# Patient Record
Sex: Female | Born: 1935 | Race: White | Hispanic: No | Smoking: Never smoker
Health system: Southern US, Community
[De-identification: ages and names within clinical notes are randomized; demographics above are authoritative.]

## PROBLEM LIST (undated history)

## (undated) DIAGNOSIS — E7800 Pure hypercholesterolemia, unspecified: Secondary | ICD-10-CM

## (undated) DIAGNOSIS — G629 Polyneuropathy, unspecified: Secondary | ICD-10-CM

## (undated) DIAGNOSIS — I4891 Unspecified atrial fibrillation: Secondary | ICD-10-CM

## (undated) DIAGNOSIS — I429 Cardiomyopathy, unspecified: Secondary | ICD-10-CM

## (undated) DIAGNOSIS — M1990 Unspecified osteoarthritis, unspecified site: Secondary | ICD-10-CM

## (undated) DIAGNOSIS — I10 Essential (primary) hypertension: Secondary | ICD-10-CM

## (undated) DIAGNOSIS — C4491 Basal cell carcinoma of skin, unspecified: Secondary | ICD-10-CM

## (undated) DIAGNOSIS — D472 Monoclonal gammopathy: Secondary | ICD-10-CM

## (undated) DIAGNOSIS — I071 Rheumatic tricuspid insufficiency: Secondary | ICD-10-CM

## (undated) DIAGNOSIS — R102 Pelvic and perineal pain: Secondary | ICD-10-CM

## (undated) DIAGNOSIS — R7881 Bacteremia: Secondary | ICD-10-CM

## (undated) DIAGNOSIS — R600 Localized edema: Secondary | ICD-10-CM

## (undated) DIAGNOSIS — K279 Peptic ulcer, site unspecified, unspecified as acute or chronic, without hemorrhage or perforation: Secondary | ICD-10-CM

## (undated) DIAGNOSIS — I872 Venous insufficiency (chronic) (peripheral): Secondary | ICD-10-CM

## (undated) DIAGNOSIS — J40 Bronchitis, not specified as acute or chronic: Secondary | ICD-10-CM

## (undated) DIAGNOSIS — R0609 Other forms of dyspnea: Secondary | ICD-10-CM

## (undated) DIAGNOSIS — B019 Varicella without complication: Secondary | ICD-10-CM

## (undated) DIAGNOSIS — I509 Heart failure, unspecified: Secondary | ICD-10-CM

## (undated) DIAGNOSIS — D126 Benign neoplasm of colon, unspecified: Secondary | ICD-10-CM

## (undated) DIAGNOSIS — D649 Anemia, unspecified: Secondary | ICD-10-CM

## (undated) DIAGNOSIS — I272 Pulmonary hypertension, unspecified: Secondary | ICD-10-CM

## (undated) DIAGNOSIS — M810 Age-related osteoporosis without current pathological fracture: Secondary | ICD-10-CM

## (undated) DIAGNOSIS — G4730 Sleep apnea, unspecified: Secondary | ICD-10-CM

## (undated) DIAGNOSIS — I482 Chronic atrial fibrillation, unspecified: Secondary | ICD-10-CM

## (undated) DIAGNOSIS — R0600 Dyspnea, unspecified: Secondary | ICD-10-CM

## (undated) DIAGNOSIS — N6019 Diffuse cystic mastopathy of unspecified breast: Secondary | ICD-10-CM

## (undated) DIAGNOSIS — I495 Sick sinus syndrome: Secondary | ICD-10-CM

## (undated) DIAGNOSIS — J45909 Unspecified asthma, uncomplicated: Secondary | ICD-10-CM

## (undated) DIAGNOSIS — E785 Hyperlipidemia, unspecified: Secondary | ICD-10-CM

## (undated) DIAGNOSIS — D509 Iron deficiency anemia, unspecified: Secondary | ICD-10-CM

## (undated) DIAGNOSIS — J189 Pneumonia, unspecified organism: Secondary | ICD-10-CM

## (undated) DIAGNOSIS — I2510 Atherosclerotic heart disease of native coronary artery without angina pectoris: Secondary | ICD-10-CM

## (undated) DIAGNOSIS — K219 Gastro-esophageal reflux disease without esophagitis: Secondary | ICD-10-CM

## (undated) HISTORY — PX: INSERT / REPLACE / REMOVE PACEMAKER: SUR710

## (undated) HISTORY — PX: TUBAL LIGATION: SHX77

## (undated) HISTORY — PX: ABDOMINAL HYSTERECTOMY: SHX81

## (undated) HISTORY — DX: Unspecified asthma, uncomplicated: J45.909

## (undated) HISTORY — PX: JOINT REPLACEMENT: SHX530

## (undated) HISTORY — DX: Hyperlipidemia, unspecified: E78.5

## (undated) HISTORY — PX: CARDIAC CATHETERIZATION: SHX172

## (undated) HISTORY — PX: BILATERAL KNEE ARTHROSCOPY: SUR91

## (undated) HISTORY — PX: PACEMAKER INSERTION: SHX728

## (undated) HISTORY — DX: Essential (primary) hypertension: I10

## (undated) HISTORY — PX: BREAST SURGERY: SHX581

## (undated) HISTORY — PX: CARDIAC PACEMAKER PLACEMENT: SHX583

## (undated) HISTORY — PX: REPLACEMENT TOTAL KNEE: SUR1224

## (undated) HISTORY — PX: HYSTERECTOMY: SHX81

---

## 1988-12-08 HISTORY — PX: BREAST EXCISIONAL BIOPSY: SUR124

## 2006-12-21 ENCOUNTER — Ambulatory Visit: Payer: Self-pay | Admitting: Family Medicine

## 2007-03-08 ENCOUNTER — Ambulatory Visit: Payer: Self-pay | Admitting: Family Medicine

## 2007-08-16 ENCOUNTER — Ambulatory Visit: Payer: Self-pay | Admitting: Unknown Physician Specialty

## 2007-09-08 ENCOUNTER — Ambulatory Visit: Payer: Self-pay | Admitting: Oncology

## 2007-09-15 ENCOUNTER — Ambulatory Visit: Payer: Self-pay | Admitting: Oncology

## 2007-10-09 ENCOUNTER — Ambulatory Visit: Payer: Self-pay | Admitting: Oncology

## 2007-12-09 ENCOUNTER — Ambulatory Visit: Payer: Self-pay | Admitting: Oncology

## 2008-01-03 ENCOUNTER — Ambulatory Visit: Payer: Self-pay | Admitting: Oncology

## 2008-01-09 ENCOUNTER — Ambulatory Visit: Payer: Self-pay | Admitting: Oncology

## 2008-05-01 ENCOUNTER — Ambulatory Visit: Payer: Self-pay | Admitting: Oncology

## 2008-05-08 ENCOUNTER — Ambulatory Visit: Payer: Self-pay | Admitting: Oncology

## 2008-08-15 ENCOUNTER — Ambulatory Visit: Payer: Self-pay | Admitting: Internal Medicine

## 2008-12-26 ENCOUNTER — Ambulatory Visit: Payer: Self-pay | Admitting: Unknown Physician Specialty

## 2008-12-28 ENCOUNTER — Inpatient Hospital Stay: Payer: Self-pay | Admitting: Unknown Physician Specialty

## 2009-08-13 ENCOUNTER — Ambulatory Visit: Payer: Self-pay | Admitting: Internal Medicine

## 2009-08-27 ENCOUNTER — Ambulatory Visit: Payer: Self-pay | Admitting: Internal Medicine

## 2010-06-04 ENCOUNTER — Ambulatory Visit: Payer: Self-pay | Admitting: Obstetrics and Gynecology

## 2010-06-06 ENCOUNTER — Ambulatory Visit: Payer: Self-pay | Admitting: Obstetrics and Gynecology

## 2010-06-14 ENCOUNTER — Ambulatory Visit: Payer: Self-pay | Admitting: Obstetrics and Gynecology

## 2010-06-17 ENCOUNTER — Ambulatory Visit: Payer: Self-pay | Admitting: Obstetrics and Gynecology

## 2010-06-17 LAB — PATHOLOGY REPORT

## 2010-06-20 LAB — PATHOLOGY REPORT

## 2010-09-02 ENCOUNTER — Ambulatory Visit: Payer: Self-pay | Admitting: Internal Medicine

## 2010-09-09 ENCOUNTER — Ambulatory Visit: Payer: Self-pay | Admitting: Internal Medicine

## 2011-09-04 ENCOUNTER — Ambulatory Visit: Payer: Self-pay | Admitting: Internal Medicine

## 2012-05-03 ENCOUNTER — Ambulatory Visit: Payer: Self-pay | Admitting: Unknown Physician Specialty

## 2012-10-01 ENCOUNTER — Ambulatory Visit: Payer: Self-pay | Admitting: Internal Medicine

## 2013-10-03 ENCOUNTER — Ambulatory Visit: Payer: Self-pay | Admitting: Internal Medicine

## 2013-10-06 ENCOUNTER — Ambulatory Visit: Payer: Self-pay | Admitting: Internal Medicine

## 2014-09-13 ENCOUNTER — Ambulatory Visit: Payer: Self-pay | Admitting: Internal Medicine

## 2014-09-13 DIAGNOSIS — G4733 Obstructive sleep apnea (adult) (pediatric): Secondary | ICD-10-CM | POA: Insufficient documentation

## 2014-10-05 ENCOUNTER — Ambulatory Visit: Payer: Self-pay | Admitting: Internal Medicine

## 2015-02-26 DIAGNOSIS — I482 Chronic atrial fibrillation, unspecified: Secondary | ICD-10-CM | POA: Insufficient documentation

## 2015-02-27 ENCOUNTER — Ambulatory Visit: Payer: Self-pay | Admitting: Internal Medicine

## 2015-03-05 ENCOUNTER — Ambulatory Visit: Payer: Self-pay | Admitting: Cardiology

## 2015-03-05 LAB — BASIC METABOLIC PANEL
Anion Gap: 5 — ABNORMAL LOW (ref 7–16)
BUN: 23 mg/dL — ABNORMAL HIGH
Calcium, Total: 8.9 mg/dL
Chloride: 106 mmol/L
Co2: 28 mmol/L
Creatinine: 0.75 mg/dL
EGFR (African American): >60
EGFR (Non-African Amer.): >60
Glucose: 92 mg/dL
Potassium: 4.1 mmol/L
Sodium: 139 mmol/L

## 2015-03-05 LAB — CBC WITH DIFFERENTIAL/PLATELET
Basophil #: 0 10*3/uL (ref 0.0–0.1)
Basophil %: 0.6 %
Eosinophil #: 0.1 10*3/uL (ref 0.0–0.7)
Eosinophil %: 1 %
HCT: 36.6 % (ref 35.0–47.0)
HGB: 11.9 g/dL — ABNORMAL LOW (ref 12.0–16.0)
Lymphocyte #: 1.6 10*3/uL (ref 1.0–3.6)
Lymphocyte %: 23.1 %
MCH: 31.1 pg (ref 26.0–34.0)
MCHC: 32.5 g/dL (ref 32.0–36.0)
MCV: 96 fL (ref 80–100)
Monocyte #: 0.6 x10 3/mm (ref 0.2–0.9)
Monocyte %: 9.6 %
Neutrophil #: 4.5 10*3/uL (ref 1.4–6.5)
Neutrophil %: 65.7 %
Platelet: 154 10*3/uL (ref 150–440)
RBC: 3.82 10*6/uL (ref 3.80–5.20)
RDW: 15 % — ABNORMAL HIGH (ref 11.5–14.5)
WBC: 6.8 10*3/uL (ref 3.6–11.0)

## 2015-03-05 LAB — PROTIME-INR
INR: 1.2
Prothrombin Time: 15.1 secs — ABNORMAL HIGH

## 2015-03-05 LAB — APTT: Activated PTT: 30.3 secs (ref 23.6–35.9)

## 2015-03-14 ENCOUNTER — Ambulatory Visit
Admit: 2015-03-14 | Disposition: A | Discharge status: Home / Self Care | Payer: Self-pay | Attending: Cardiology | Admitting: Cardiology

## 2015-03-21 DIAGNOSIS — R600 Localized edema: Secondary | ICD-10-CM | POA: Insufficient documentation

## 2015-03-27 ENCOUNTER — Emergency Department: Payer: Self-pay

## 2015-03-27 ENCOUNTER — Emergency Department: Payer: Medicare (Managed Care)

## 2015-03-27 ENCOUNTER — Emergency Department
Admission: EM | Admit: 2015-03-27 | Discharge: 2015-03-27 | Disposition: A | Discharge status: Home / Self Care | Payer: Medicare (Managed Care) | Attending: Emergency Medicine | Admitting: Emergency Medicine

## 2015-03-27 DIAGNOSIS — S39011A Strain of muscle, fascia and tendon of abdomen, initial encounter: Secondary | ICD-10-CM | POA: Insufficient documentation

## 2015-03-27 DIAGNOSIS — R1031 Right lower quadrant pain: Secondary | ICD-10-CM | POA: Insufficient documentation

## 2015-03-27 DIAGNOSIS — N898 Other specified noninflammatory disorders of vagina: Secondary | ICD-10-CM | POA: Insufficient documentation

## 2015-03-27 DIAGNOSIS — S76219A Strain of adductor muscle, fascia and tendon of unspecified thigh, initial encounter: Secondary | ICD-10-CM

## 2015-03-27 HISTORY — DX: Unspecified osteoarthritis, unspecified site: M19.90

## 2015-03-27 HISTORY — DX: Pure hypercholesterolemia, unspecified: E78.00

## 2015-03-27 HISTORY — DX: Essential (primary) hypertension: I10

## 2015-03-27 HISTORY — DX: Unspecified atrial fibrillation: I48.91

## 2015-03-27 LAB — COMPREHENSIVE METABOLIC PANEL
ALT: 16 U/L (ref 0–55)
AST (SGOT): 21 U/L (ref 5–34)
Albumin/Globulin Ratio: 1.1 (ref 0.9–2.2)
Albumin: 3.4 g/dL — ABNORMAL LOW (ref 3.5–5.0)
Alkaline Phosphatase: 89 U/L (ref 37–106)
Anion Gap: 8 (ref 5.0–15.0)
BUN: 20 mg/dL — ABNORMAL HIGH (ref 7.0–19.0)
Bilirubin, Total: 1.9 mg/dL — ABNORMAL HIGH (ref 0.2–1.2)
CO2: 25 mEq/L (ref 22–29)
Calcium: 8.8 mg/dL (ref 7.9–10.2)
Chloride: 105 mEq/L (ref 100–111)
Creatinine: 0.8 mg/dL (ref 0.6–1.0)
Globulin: 3.2 g/dL (ref 2.0–3.6)
Glucose: 99 mg/dL (ref 70–100)
Potassium: 4 mEq/L (ref 3.5–5.1)
Protein, Total: 6.6 g/dL (ref 6.0–8.3)
Sodium: 138 mEq/L (ref 136–145)

## 2015-03-27 LAB — URINALYSIS
Bilirubin, UA: NEGATIVE
Glucose, UA: NEGATIVE
Leukocyte Esterase, UA: NEGATIVE
Nitrite, UA: NEGATIVE
Protein, UR: 30 — AB
Specific Gravity UA: 1.024 (ref 1.001–1.035)
Urine pH: 6 (ref 5.0–8.0)
Urobilinogen, UA: 2 mg/dL (ref 0.2–2.0)

## 2015-03-27 LAB — CBC AND DIFFERENTIAL
Hematocrit: 35.7 % — ABNORMAL LOW (ref 37.0–47.0)
Hgb: 11 g/dL — ABNORMAL LOW (ref 12.0–16.0)
MCH: 30.8 pg (ref 28.0–32.0)
MCHC: 30.8 g/dL — ABNORMAL LOW (ref 32.0–36.0)
MCV: 100 fL (ref 80.0–100.0)
MPV: 10.3 fL (ref 9.4–12.3)
Platelets: 140 10*3/uL (ref 140–400)
RBC: 3.57 10*6/uL — ABNORMAL LOW (ref 4.20–5.40)
RDW: 15 % (ref 12–15)
WBC: 6.75 10*3/uL (ref 3.50–10.80)

## 2015-03-27 LAB — URINE MICROSCOPIC

## 2015-03-27 LAB — MAN DIFF ONLY
Atypical Lymphocytes %: 1 %
Atypical Lymphocytes Absolute: 0.07 10*3/uL — ABNORMAL HIGH
Band Neutrophils Absolute: 0.07 10*3/uL (ref 0.00–1.00)
Band Neutrophils: 1 %
Basophils Absolute Manual: 0 10*3/uL (ref 0.00–0.20)
Basophils Manual: 0 %
Eosinophils Absolute Manual: 0 10*3/uL (ref 0.00–0.70)
Eosinophils Manual: 0 %
Lymphocytes Absolute Manual: 0.41 10*3/uL — ABNORMAL LOW (ref 0.50–4.40)
Lymphocytes Manual: 6 %
Monocytes Absolute: 0.74 10*3/uL (ref 0.00–1.20)
Monocytes Manual: 11 %
Neutrophils Absolute Manual: 5.47 10*3/uL (ref 1.80–8.10)
Nucleated RBC: 0 /100 WBC (ref 0–1)
Segmented Neutrophils: 81 %

## 2015-03-27 LAB — LIPASE: Lipase: 28 U/L (ref 8–78)

## 2015-03-27 LAB — CELL MORPHOLOGY
Cell Morphology: NORMAL
Platelet Estimate: NORMAL

## 2015-03-27 LAB — GFR: EGFR: >60

## 2015-03-27 MED ORDER — IOHEXOL 240 MG/ML IJ SOLN
50.0000 mL | Freq: Once | INTRAMUSCULAR | Status: AC
Start: 2015-03-27 — End: 2015-03-27
  Administered 2015-03-27: 50 mL via ORAL

## 2015-03-27 MED ORDER — SODIUM CHLORIDE 0.9 % IV BOLUS
1000.0000 mL | Freq: Once | INTRAVENOUS | Status: AC
Start: 2015-03-27 — End: 2015-03-27
  Administered 2015-03-27: 1000 mL via INTRAVENOUS

## 2015-03-27 MED ORDER — ONDANSETRON HCL 4 MG/2ML IJ SOLN
4.0000 mg | Freq: Once | INTRAMUSCULAR | Status: AC
Start: 2015-03-27 — End: 2015-03-27
  Administered 2015-03-27: 4 mg via INTRAVENOUS
  Filled 2015-03-27: qty 2

## 2015-03-27 MED ORDER — IODIXANOL 320 MG/ML IV SOLN
100.0000 mL | Freq: Once | INTRAVENOUS | Status: AC | PRN
Start: 2015-03-27 — End: 2015-03-27
  Administered 2015-03-27: 100 mL via INTRAVENOUS

## 2015-03-27 MED ORDER — HYDROCODONE-ACETAMINOPHEN 5-325 MG PO TABS
0.5000 | ORAL_TABLET | Freq: Four times a day (QID) | ORAL | Status: DC | PRN
Start: 2015-03-27 — End: 2015-04-07

## 2015-03-27 MED ORDER — MORPHINE SULFATE 2 MG/ML IJ/IV SOLN (WRAP)
2.0000 mg | Freq: Once | Status: AC
Start: 2015-03-27 — End: 2015-03-27
  Administered 2015-03-27: 2 mg via INTRAVENOUS
  Filled 2015-03-27: qty 1

## 2015-03-27 NOTE — Discharge Instructions (Signed)
p  Abdominal Pain    You have been diagnosed with abdominal (belly) pain. The cause of your pain is not yet known.    Many things can cause abdominal pain. Examples include viral infections and bowel (intestine) spasms. You might need another examination or more tests to find out why you have pain.    At this time, your pain does not seem to be caused by anything dangerous. You do not need surgery. You do not need to stay in the hospital.     Though we don't believe your condition is dangerous right now, it is important to be careful. Sometimes a problem that seems mild can become serious later. This is why it is very important that you return here or go to the nearest Emergency Department unless you are 100% improved.    YOU SHOULD SEEK MEDICAL ATTENTION IMMEDIATELY, EITHER HERE OR AT THE NEAREST EMERGENCY DEPARTMENT, IF ANY OF THE FOLLOWING OCCURS:   Your pain does not go away or gets worse.   You cannot keep fluids down or your vomit is dark green.    You vomit blood or see blood in your stool. Blood might be bright red or dark red. It can also be black and look like tar.   You have a fever (temperature higher than 100.40F / 38C) or shaking chills.   Your skin or eyes look yellow or your urine looks brown.   You have severe diarrhea.              Muscle Strain, General    You have been diagnosed with a muscle strain.    Any muscle in the body can be strained. A strain is an injury to muscles where some muscle fibers are injured by being stretched or partly torn. This usually happens from using the muscle too much or from doing an activity the muscle is not used to.    Some strain symptoms are pain, muscle cramping and soreness to the touch.    Often, muscle pain and stiffness are worse the next day. This is much like what happens when someone starts exercising for the first time. After exercising, the person may feel pretty good. However, the next day all the exercised muscles are stiff and  sore.    General strain treatment includes:   Resting the affected part.   Pain medicine.   Muscle relaxant medicines.   Warm compresses (such as a warm, moist towel).   Gently stretching the injured muscle.   When tolerated, gently massaging the injured area.    This injury is self-limited (it gets better on its own). It rarely needs specific treatment.    YOU SHOULD SEEK MEDICAL ATTENTION IMMEDIATELY, EITHER HERE OR AT THE NEAREST EMERGENCY DEPARTMENT, IF ANY OF THE FOLLOWING OCCURS:   Major increase in swelling of the affected area.   Pain gets worse instead of gradually improving.   Skin gets red over the affected area.   Unable to use the affected limb. Limb weakness or numbness.            Vaginal Irritation    You have been seen for irritation of your vagina.    There are several reasons why this area can become irritated. They are:   Trauma (injury or shock) while having sex.   Infections like ones from yeast or a sexually transmitted disease (STD).   Use of tampons or other foreign bodies in the vagina.   Foreign bodies in the vagina  for an extended period of time. This could be a tampon, piece of toilet paper, a birth control device, or a "toy" used during sex.   Contact with detergents or other irritants on underwear and other clothing.    You should not have sex until your irritation is completely gone.    Do not use tampons or other foreign bodies until the irritation is completely gone.    Follow up with your family doctor about your vaginal irritation.    Follow up with your Ob/Gyn about your vaginal irritation.    YOU SHOULD SEEK MEDICAL ATTENTION IMMEDIATELY, EITHER HERE OR AT THE NEAREST EMERGENCY DEPARTMENT, IF ANY OF THE FOLLOWING OCCUR:   Your symptoms get worse.   You have any other problems or concerns.   You have worse vaginal discharge or bleeding.   You develop fever (temperature higher than 100.64F / 38C).

## 2015-03-27 NOTE — ED Notes (Signed)
Pt has had burning pain in groin, labia and radiates to legs since Sunday

## 2015-03-27 NOTE — ED Provider Notes (Signed)
Physician/Midlevel provider first contact with patient: 03/27/15 1720         History     Chief Complaint   Patient presents with   . Groin Pain   . Leg Pain     HPI Comments: Pt presents c/o bilateral groin pain, vaginal irritation, and RLQ abdominal pain.  Pt states she did a lot of walking 3 days ago which she is not used to and she started having pain the next day.  Pt also states she has had vaginal burning and worried she has a yeast infection.  No vaginal itching.  RLQ abdominal pain for the past 3 day.  No N/V/D.      Patient is a 79 y.o. female presenting with groin pain and abdominal pain. The history is provided by the patient. No language interpreter was used.   Groin Pain  This is a new problem. The current episode started in the past 7 days. The problem occurs constantly. The problem has been unchanged. Associated symptoms include abdominal pain. Pertinent negatives include no anorexia, chest pain, chills, congestion, coughing, diaphoresis, fever, headaches, nausea, neck pain, numbness, rash, sore throat, visual change, vomiting or weakness. The symptoms are aggravated by twisting and walking. She has tried nothing for the symptoms.   Abdominal Pain  Pain location:  RLQ  Pain quality: aching    Pain radiates to:  Does not radiate  Pain severity:  Moderate  Onset quality:  Gradual  Duration:  3 days  Timing:  Constant  Progression:  Unchanged  Chronicity:  New  Relieved by:  Nothing  Worsened by:  Palpation  Ineffective treatments:  None tried  Associated symptoms: no anorexia, no chest pain, no chills, no cough, no dysuria, no fever, no nausea, no shortness of breath, no sore throat, no vaginal bleeding, no vaginal discharge and no vomiting             Past Medical History   Diagnosis Date   . Hypertension    . Congestive heart failure    . Atrial fibrillation    . Arthritis    . Hypercholesterolemia        Past Surgical History   Procedure Laterality Date   . Hysterectomy     . Replacement total knee          History reviewed. No pertinent family history.    Social  History   Substance Use Topics   . Smoking status: Never Smoker    . Smokeless tobacco: Not on file   . Alcohol Use: Yes       .     Allergies   Allergen Reactions   . Codeine        Home Medications                   albuterol (PROVENTIL HFA;VENTOLIN HFA) 108 (90 BASE) MCG/ACT inhaler     Inhale 2 puffs into the lungs.     atorvastatin (LIPITOR) 10 MG tablet     Take 10 mg by mouth daily.     Biotin 1000 MCG tablet     Take 1,000 mcg by mouth 3 (three) times daily.     Cholecalciferol (VITAMIN D-3 PO)     Take by mouth.     digoxin (LANOXIN) 0.125 MG tablet     Take 125 mcg by mouth.     furosemide (LASIX) 20 MG tablet     Take 20 mg by mouth 2 (two) times daily.  losartan (COZAAR) 25 MG tablet     Take 25 mg by mouth daily.     metoprolol XL (TOPROL-XL) 50 MG 24 hr tablet     Take 50 mg by mouth 2 (two) times daily.     Mometasone Furo-Formoterol Fum (DULERA IN)     Inhale into the lungs.     Potassium (POTASSIMIN PO)     Take by mouth.     traMADol (ULTRAM) 50 MG tablet     Take 50 mg by mouth every 6 (six) hours as needed for Pain.           Review of Systems   Constitutional: Negative for fever, chills and diaphoresis.   HENT: Negative for congestion and sore throat.    Respiratory: Negative for cough and shortness of breath.    Cardiovascular: Negative for chest pain and palpitations.   Gastrointestinal: Positive for abdominal pain. Negative for nausea, vomiting and anorexia.   Genitourinary: Positive for vaginal pain. Negative for dysuria, vaginal bleeding and vaginal discharge.   Musculoskeletal: Negative for back pain and neck pain.   Skin: Negative for color change, pallor, rash and wound.   Neurological: Negative for weakness, numbness and headaches.   Hematological: Negative for adenopathy. Does not bruise/bleed easily.   Psychiatric/Behavioral: Negative for confusion and agitation.       Physical Exam    BP: 170/89 mmHg, Heart Rate:  77, Temp: 99.8 F (37.7 C), Resp Rate: 18, SpO2: 98 %, Weight: 97.07 kg    Physical Exam   Constitutional: She is oriented to person, place, and time. She appears well-developed and well-nourished. No distress.   HENT:   Head: Normocephalic and atraumatic.   Right Ear: External ear normal.   Left Ear: External ear normal.   Nose: Nose normal.   Mouth/Throat: Oropharynx is clear and moist. No oropharyngeal exudate.   Eyes: Conjunctivae are normal. Pupils are equal, round, and reactive to light. Right eye exhibits no discharge. Left eye exhibits no discharge. No scleral icterus.   Neck: Normal range of motion. Neck supple.   Cardiovascular: Normal rate, regular rhythm, normal heart sounds and intact distal pulses.  Exam reveals no gallop and no friction rub.    No murmur heard.  Pulmonary/Chest: Effort normal and breath sounds normal. No respiratory distress. She has no wheezes. She has no rales. She exhibits no tenderness.   Abdominal: Soft. Bowel sounds are normal. She exhibits no distension and no mass. There is tenderness in the right lower quadrant. There is no rigidity, no rebound, no guarding and no CVA tenderness. No hernia.   Genitourinary: Vagina normal.       There is lesion on the right labia. There is no rash, tenderness or injury on the right labia. There is no rash, tenderness, lesion or injury on the left labia. Cervix exhibits no motion tenderness, no discharge and no friability. Right adnexum displays no mass, no tenderness and no fullness. Left adnexum displays no mass, no tenderness and no fullness.   Musculoskeletal: Normal range of motion. She exhibits tenderness.        Right hip: She exhibits tenderness. She exhibits normal range of motion, normal strength, no bony tenderness, no swelling, no crepitus, no deformity and no laceration.        Left hip: She exhibits tenderness. She exhibits normal range of motion, normal strength, no bony tenderness, no swelling, no crepitus, no deformity and no  laceration.        Legs:  Neurological:  She is alert and oriented to person, place, and time.   Skin: Skin is warm and dry. She is not diaphoretic.   Psychiatric: She has a normal mood and affect. Her behavior is normal. Judgment and thought content normal.   Nursing note and vitals reviewed.        MDM and ED Course     ED Medication Orders     Start Ordered     Status Ordering Provider    03/27/15 1756 03/27/15 1755  sodium chloride 0.9 % bolus 1,000 mL   Once     Route: Intravenous  Ordered Dose: 1,000 mL     Last MAR action:  New Bag Amair Shrout M    03/27/15 1756 03/27/15 1755  iohexol (OMNIPAQUE) 240 MG/ML IV/ORAL solution 50 mL   Once     Route: Oral  Ordered Dose: 50 mL     Last MAR action:  Given Ulla Gallo    03/27/15 1756 03/27/15 1755  morphine injection 2 mg   Once     Route: Intravenous  Ordered Dose: 2 mg     Last MAR action:  Given Azhar Yogi M    03/27/15 1756 03/27/15 1755  ondansetron (ZOFRAN) injection 4 mg   Once     Route: Intravenous  Ordered Dose: 4 mg     Last MAR action:  Given Gevork Ayyad M              MDM  Number of Diagnoses or Management Options  Groin strain, unspecified laterality, initial encounter:   Right lower quadrant abdominal pain:   Vaginal irritation:   Diagnosis management comments: Bethany Howell , PA-C, have been the primary provider for Bethany Howell during this Emergency Dept visit. The attending signature signifies review and agreement of the history, physical exam, evaluation, clinical impression and plan except as noted.   I have reviewed the nursing notes, including Past medical and surgical,Family and Social History.     Lab and radiology results viewed and discussed with pt and daughter.  Pt does have a history of enlarged heart and CHF.  Pt does not have any CP or SOB.      Discussed with patient need for follow-up. Return to the ER for any concerns. Pt voices understanding. No questions.          Amount and/or Complexity of Data Reviewed  Clinical lab  tests: ordered and reviewed  Tests in the radiology section of CPT: ordered and reviewed  Discuss the patient with other providers: yes (Discussed with Dr Bufford Buttner )    Risk of Complications, Morbidity, and/or Mortality  Presenting problems: moderate  Diagnostic procedures: moderate  Management options: moderate    Patient Progress  Patient progress: stable        Results     Procedure Component Value Units Date/Time    Wet Prep Trichomonas [[id number]] Collected:  03/27/15 1916    Specimen Information:  Cervical Swab Updated:  03/27/15 1957    Narrative:      ORDER#: [id number]                                    ORDERED BY: Preciosa Bundrick  SOURCE: Cervical Swab                                COLLECTED:  03/27/15  19:16  ANTIBIOTICS AT COLL.:                                RECEIVED :  03/27/15 19:30  Wet Prep Trichomonas                       FINAL       03/27/15 19:56  03/27/15   No WBCs Seen             No Trichomonas Seen             No Yeast/Fungal Elements Seen             No Clue Cells Seen             Reference Range: No Trichomonas or Yeast Seen      UA with reflex to micro (all freestanding ED's except [facility]) [[id number]]  (Abnormal) Collected:  03/27/15 1916    Specimen Information:  Urine Updated:  03/27/15 1952     Urine Type Clean Catch      Color, UA YELLOW      Clarity, UA CLEAR      Specific Gravity UA 1.024      Urine pH 6.0      Leukocyte Esterase, UA NEGATIVE      Nitrite, UA NEGATIVE      Protein, UR 30 (A)      Glucose, UA NEGATIVE      Ketones UA TRACE      Urobilinogen, UA 2.0 mg/dL      Bilirubin, UA NEGATIVE      Blood, UA SMALL (A)     Microscopic, Urine [[id number]] Collected:  03/27/15 1916     RBC, UA 0 - 5 /hpf Updated:  03/27/15 1952     WBC, UA 0 - 5 /hpf      Squamous Epithelial Cells, Urine 6 - 10 /hpf      Urine Mucus Present     Comprehensive metabolic panel [[id number]]  (Abnormal) Collected:  03/27/15 1816    Specimen Information:  Blood Updated:  03/27/15 1928      Glucose 99 mg/dL      BUN 08.6 (H) mg/dL      Creatinine 0.8 mg/dL      Sodium 578 mEq/L      Potassium 4.0 mEq/L      Chloride 105 mEq/L      CO2 25 mEq/L      CALCIUM 8.8 mg/dL      Protein, Total 6.6 g/dL      Albumin 3.4 (L) g/dL      AST (SGOT) 21 U/L      ALT 16 U/L      Alkaline Phosphatase 89 U/L      Bilirubin, Total 1.9 (H) mg/dL      Globulin 3.2 g/dL      Albumin/Globulin Ratio 1.1      Anion Gap 8.0     Lipase [[id number]] Collected:  03/27/15 1816    Specimen Information:  Blood Updated:  03/27/15 1928     Lipase 28 U/L     Manual Differential [[id number]]  (Abnormal) Collected:  03/27/15 1816     Segmented Neutrophils 81 % Updated:  03/27/15 1920     Band Neutrophils 1 %      Lymphocytes Manual 6 %      Monocytes Manual 11 %  Eosinophils Manual 0 %      Basophils Manual 0 %      Atypical Lymph % 1 %      Nucleated RBC 0 /100 WBC      Abs Seg Manual 5.47 x10 3/uL      Bands Absolute 0.07 x10 3/uL      Absolute Lymph Manual 0.41 (L) x10 3/uL      Monocytes Absolute 0.74 x10 3/uL      Absolute Eos Manual 0.00 x10 3/uL      Absolute Baso Manual 0.00 x10 3/uL      Atypical Lymph Absolute 0.07 (H) x10 3/uL     Cell MorpHology [[id number]] Collected:  03/27/15 1816     Cell Morphology: Normal Updated:  03/27/15 1920     Platelet Estimate Normal     CBC with differential [[id number]]  (Abnormal) Collected:  03/27/15 1816    Specimen Information:  Blood / Blood Updated:  03/27/15 1919     WBC 6.75 x10 3/uL      Hgb 11.0 (L) g/dL      Hematocrit 21.3 (L) %      Platelets 140 x10 3/uL      RBC 3.57 (L) x10 6/uL      MCV 100.0 fL      MCH 30.8 pg      MCHC 30.8 (L) g/dL      RDW 15 %      MPV 10.3 fL     GFR [[id number]] Collected:  03/27/15 1816     EGFR >60.0 Updated:  03/27/15 1909        Radiology Results (24 Hour)     Procedure Component Value Units Date/Time    CT Abd/Pelvis with IV and PO Contrast [[id number]] Collected:  03/27/15 2204    Order Status:  Completed Updated:  03/27/15 2215    Narrative:       CLINICAL INFORMATION: 79 year old. Groin pain for 7 days. Right lower  quadrant pain for 3 days.    TECHNIQUE AND FINDINGS: CT of the abdomen and pelvis with oral and  intravenous contrast. No comparisons.    Intestinal gas pattern within normal limits with no evidence of  obstruction. No right lower quadrant inflammatory changes. No inguinal  or abdominal wall hernia. Tiny fat-containing umbilical hernia. No  inguinal adenopathy    No abnormal pelvic mass or fluid collection. No pelvic adenopathy.  Bladder appears normal. No significant renal abnormality. Several small  renal cysts. No perirenal abnormality. Unremarkable adrenal glands.    Normal size liver with no focal abnormality. Mild periportal edema and  prominent hepatic IVC and hepatic veins suggest elevated right heart  pressure. No calcified gallstones or biliary dilatation. Unremarkable  spleen and pancreas. No retroperitoneal adenopathy. No significant  abnormality of the aorta.    Lower chest images demonstrate a small hiatus hernia. The heart is  moderately to markedly enlarged with enlargement of the right atrium and  right ventricle. Intact pacemaker. No pericardial or pleural effusion.  No acute findings at the lung bases.      Impression:        1. No acute or significant abnormality identified to explain either the  patient's right lower quadrant pain groin pain.  2. No acute abdominal findings.  3. Moderate to marked cardiac enlargement with enlargement of the right  atrium and right ventricle and distended hepatic IVC and hepatic veins  consistent with elevated right heart pressure.     Annabell Sabal,  MD   03/27/2015 10:10 PM             Procedures    Clinical Impression & Disposition     Clinical Impression  Final diagnoses:   Right lower quadrant abdominal pain   Vaginal irritation   Groin strain, unspecified laterality, initial encounter        ED Disposition     Discharge Mikyah Cressman discharge to home/self care.    Condition at  disposition: Stable             New Prescriptions    HYDROCODONE-ACETAMINOPHEN (NORCO) 5-325 MG PER TABLET    Take 0.5-1 tablets by mouth every 6 (six) hours as needed.                   Ulla Gallo, [place]  03/27/15 2231    Rosita Kea [place], [place]  03/27/15 2231

## 2015-03-27 NOTE — ED Notes (Signed)
I, Karle Plumber MD, have discussed with the APP, and personally seen and examined this patient, and have fully participated in their care.    Brief HPI: pt with bilateral pelvic/inguinal pain for the last 2days after excessive walking; No falls/trauma; Pain has gradually gotten worse and worse with leg movements or turning her legs; Pain increases with walking and movements and better with rest; No fevers/chills; No n/v/d; No cp/sob; no LE pain or swelling; no pleuritic pain; No rash to the area; no trouble breathing;        PHYSICAL EXAM  General: Awake and alert, NAD; Non-toxic; No resp distress; Answers all questions appropriately    HEART: RRR  LUNGS: CTAB  ABD: (+)Mild RLQ abd tenderness; no rebound and no guarding; No hernia; No rash; No CVAT;   Pelvis: No bony tenderness; FROM of the hips bilaterally;   EXT: No calf tenderness  Normal n/v exam to the LE bilat      Filed Vitals:    03/27/15 1727 03/27/15 1848 03/27/15 2142   BP: 170/89 152/66 118/59   Pulse: 77 81 73   Temp: 99.8 F (37.7 C)     Resp: 18 18 16    Height: 5\' 5"  (1.651 m)     Weight: 97.07 kg     SpO2: 98% 99% 97%       Results for orders placed or performed during the hospital encounter of 03/27/15   Wet Prep Trichomonas    Narrative    ORDER#: [id number]                                    ORDERED BY: CURRY, NICOLE  SOURCE: Cervical Swab                                COLLECTED:  03/27/15 19:16  ANTIBIOTICS AT COLL.:                                RECEIVED :  03/27/15 19:30  Wet Prep Trichomonas                       FINAL       03/27/15 19:56  03/27/15   No WBCs Seen             No Trichomonas Seen             No Yeast/Fungal Elements Seen             No Clue Cells Seen             Reference Range: No Trichomonas or Yeast Seen     CBC with differential   Result Value Ref Range    WBC 6.75 3.50 - 10.80 x10 3/uL    Hgb 11.0 (L) 12.0 - 16.0 g/dL    Hematocrit 40.9 (L) 37.0 - 47.0 %    Platelets 140 140 - 400 x10 3/uL    RBC 3.57 (L) 4.20 - 5.40  x10 6/uL    MCV 100.0 80.0 - 100.0 fL    MCH 30.8 28.0 - 32.0 pg    MCHC 30.8 (L) 32.0 - 36.0 g/dL    RDW 15 12 - 15 %    MPV 10.3 9.4 - 12.3 fL   Comprehensive metabolic panel  Result Value Ref Range    Glucose 99 70 - 100 mg/dL    BUN 32.9 (H) 7.0 - 51.8 mg/dL    Creatinine 0.8 0.6 - 1.0 mg/dL    Sodium 841 660 - 630 mEq/L    Potassium 4.0 3.5 - 5.1 mEq/L    Chloride 105 100 - 111 mEq/L    CO2 25 22 - 29 mEq/L    CALCIUM 8.8 7.9 - 10.2 mg/dL    Protein, Total 6.6 6.0 - 8.3 g/dL    Albumin 3.4 (L) 3.5 - 5.0 g/dL    AST (SGOT) 21 5 - 34 U/L    ALT 16 0 - 55 U/L    Alkaline Phosphatase 89 37 - 106 U/L    Bilirubin, Total 1.9 (H) 0.2 - 1.2 mg/dL    Globulin 3.2 2.0 - 3.6 g/dL    Albumin/Globulin Ratio 1.1 0.9 - 2.2    Anion Gap 8.0 5.0 - 15.0   Lipase   Result Value Ref Range    Lipase 28 8 - 78 U/L   UA with reflex to micro (all freestanding ED's except [facility])   Result Value Ref Range    Urine Type Clean Catch     Color, UA YELLOW Clear - Yellow    Clarity, UA CLEAR Clear - Hazy    Specific Gravity UA 1.024 1.001-1.035    Urine pH 6.0 5.0-8.0    Leukocyte Esterase, UA NEGATIVE Negative    Nitrite, UA NEGATIVE Negative    Protein, UR 30 (A) Negative    Glucose, UA NEGATIVE Negative    Ketones UA TRACE Negative    Urobilinogen, UA 2.0 0.2-2.0 mg/dL    Bilirubin, UA NEGATIVE Negative    Blood, UA SMALL (A) Negative   GFR   Result Value Ref Range    EGFR >60.0    Manual Differential   Result Value Ref Range    Segmented Neutrophils 81 None %    Band Neutrophils 1 None %    Lymphocytes Manual 6 None %    Monocytes Manual 11 None %    Eosinophils Manual 0 None %    Basophils Manual 0 None %    Atypical Lymph % 1 None %    Nucleated RBC 0 0 - 1 /100 WBC    Abs Seg Manual 5.47 1.80 - 8.10 x10 3/uL    Bands Absolute 0.07 0.00 - 1.00 x10 3/uL    Absolute Lymph Manual 0.41 (L) 0.50 - 4.40 x10 3/uL    Monocytes Absolute 0.74 0.00 - 1.20 x10 3/uL    Absolute Eos Manual 0.00 0.00 - 0.70 x10 3/uL    Absolute Baso  Manual 0.00 0.00 - 0.20 x10 3/uL    Atypical Lymph Absolute 0.07 (H) 0 x10 3/uL   Cell MorpHology   Result Value Ref Range    Cell Morphology: Normal     Platelet Estimate Normal    Microscopic, Urine   Result Value Ref Range    RBC, UA 0 - 5 0 - 5 /hpf    WBC, UA 0 - 5 0 - 5 /hpf    Squamous Epithelial Cells, Urine 6 - 10 0 - 25 /hpf    Urine Mucus Present None         Ct Abd/pelvis With Iv And Po Contrast    03/27/2015    1. No acute or significant abnormality identified to explain either the patient's right lower quadrant pain groin pain. 2. No acute  abdominal findings. 3. Moderate to marked cardiac enlargement with enlargement of the right atrium and right ventricle and distended hepatic IVC and hepatic veins consistent with elevated right heart pressure.   Annabell Sabal, MD  03/27/2015 10:10 PM         A/P: CT noted; Pt has no CP/SOB; No DOE; No pleuritic pain or symptoms; no LE pain or swelling; Lungs are CTA; No wheezing and no rales; pt is not in CHF  -->Pt and daughter updated on the CT and report given to them  -->Counseled on the need to f/u with Cardiology      Horatio Pel, MD  03/29/15 [phone]

## 2015-03-31 ENCOUNTER — Inpatient Hospital Stay
Admission: EM | Admit: 2015-03-31 | Discharge: 2015-04-07 | DRG: 394 | Disposition: A | Discharge status: Home Health | Payer: Medicare (Managed Care) | Attending: Internal Medicine | Admitting: Internal Medicine

## 2015-03-31 ENCOUNTER — Emergency Department: Payer: Medicare (Managed Care)

## 2015-03-31 ENCOUNTER — Inpatient Hospital Stay: Payer: Medicare Other | Admitting: Internal Medicine

## 2015-03-31 DIAGNOSIS — J209 Acute bronchitis, unspecified: Secondary | ICD-10-CM | POA: Diagnosis present

## 2015-03-31 DIAGNOSIS — B954 Other streptococcus as the cause of diseases classified elsewhere: Secondary | ICD-10-CM | POA: Diagnosis present

## 2015-03-31 DIAGNOSIS — R102 Pelvic and perineal pain: Secondary | ICD-10-CM | POA: Diagnosis present

## 2015-03-31 DIAGNOSIS — I482 Chronic atrial fibrillation: Secondary | ICD-10-CM | POA: Diagnosis present

## 2015-03-31 DIAGNOSIS — R7881 Bacteremia: Secondary | ICD-10-CM | POA: Diagnosis present

## 2015-03-31 DIAGNOSIS — E780 Pure hypercholesterolemia: Secondary | ICD-10-CM | POA: Diagnosis present

## 2015-03-31 DIAGNOSIS — I429 Cardiomyopathy, unspecified: Secondary | ICD-10-CM | POA: Diagnosis present

## 2015-03-31 DIAGNOSIS — Z950 Presence of cardiac pacemaker: Secondary | ICD-10-CM

## 2015-03-31 DIAGNOSIS — R531 Weakness: Secondary | ICD-10-CM | POA: Diagnosis present

## 2015-03-31 DIAGNOSIS — M1990 Unspecified osteoarthritis, unspecified site: Secondary | ICD-10-CM | POA: Diagnosis present

## 2015-03-31 DIAGNOSIS — D123 Benign neoplasm of transverse colon: Principal | ICD-10-CM | POA: Diagnosis present

## 2015-03-31 DIAGNOSIS — S30814A Abrasion of vagina and vulva, initial encounter: Secondary | ICD-10-CM

## 2015-03-31 DIAGNOSIS — K5730 Diverticulosis of large intestine without perforation or abscess without bleeding: Secondary | ICD-10-CM | POA: Diagnosis present

## 2015-03-31 DIAGNOSIS — I10 Essential (primary) hypertension: Secondary | ICD-10-CM | POA: Diagnosis present

## 2015-03-31 DIAGNOSIS — K648 Other hemorrhoids: Secondary | ICD-10-CM | POA: Diagnosis present

## 2015-03-31 DIAGNOSIS — Z85828 Personal history of other malignant neoplasm of skin: Secondary | ICD-10-CM

## 2015-03-31 DIAGNOSIS — Z96651 Presence of right artificial knee joint: Secondary | ICD-10-CM | POA: Diagnosis present

## 2015-03-31 DIAGNOSIS — E785 Hyperlipidemia, unspecified: Secondary | ICD-10-CM | POA: Diagnosis present

## 2015-03-31 DIAGNOSIS — I081 Rheumatic disorders of both mitral and tricuspid valves: Secondary | ICD-10-CM | POA: Diagnosis present

## 2015-03-31 DIAGNOSIS — I4891 Unspecified atrial fibrillation: Secondary | ICD-10-CM | POA: Diagnosis present

## 2015-03-31 DIAGNOSIS — Z8679 Personal history of other diseases of the circulatory system: Secondary | ICD-10-CM

## 2015-03-31 DIAGNOSIS — I872 Venous insufficiency (chronic) (peripheral): Secondary | ICD-10-CM | POA: Diagnosis present

## 2015-03-31 DIAGNOSIS — R509 Fever, unspecified: Secondary | ICD-10-CM

## 2015-03-31 DIAGNOSIS — Z7901 Long term (current) use of anticoagulants: Secondary | ICD-10-CM

## 2015-03-31 HISTORY — DX: Cardiomyopathy, unspecified: I42.9

## 2015-03-31 HISTORY — DX: Basal cell carcinoma of skin, unspecified: C44.91

## 2015-03-31 LAB — COMPREHENSIVE METABOLIC PANEL
ALT: 12 U/L (ref 0–55)
AST (SGOT): 24 U/L (ref 5–34)
Albumin/Globulin Ratio: 0.8 — ABNORMAL LOW (ref 0.9–2.2)
Albumin: 3.1 g/dL — ABNORMAL LOW (ref 3.5–5.0)
Alkaline Phosphatase: 104 U/L (ref 37–106)
Anion Gap: 10 (ref 5.0–15.0)
BUN: 12.8 mg/dL (ref 7.0–19.0)
Bilirubin, Total: 0.9 mg/dL (ref 0.2–1.2)
CO2: 24 mEq/L (ref 22–29)
Calcium: 9 mg/dL (ref 7.9–10.2)
Chloride: 104 mEq/L (ref 100–111)
Creatinine: 0.8 mg/dL (ref 0.6–1.0)
Globulin: 3.7 g/dL — ABNORMAL HIGH (ref 2.0–3.6)
Glucose: 112 mg/dL — ABNORMAL HIGH (ref 70–100)
Potassium: 3.7 mEq/L (ref 3.5–5.1)
Protein, Total: 6.8 g/dL (ref 6.0–8.3)
Sodium: 138 mEq/L (ref 136–145)

## 2015-03-31 LAB — DIGOXIN LEVEL: Digoxin Level: 0.38 ng/mL — ABNORMAL LOW (ref 0.50–2.00)

## 2015-03-31 LAB — PT AND APTT
PT INR: 4
PT: 38 s (ref 12.6–15.0)
PTT: 56 s — ABNORMAL HIGH (ref 23–37)

## 2015-03-31 LAB — CBC AND DIFFERENTIAL
Basophils Absolute Automated: 0.01 10*3/uL (ref 0.00–0.20)
Basophils Automated: 0 %
Eosinophils Absolute Automated: 0.06 10*3/uL (ref 0.00–0.70)
Eosinophils Automated: 1 %
Hematocrit: 35.4 % — ABNORMAL LOW (ref 37.0–47.0)
Hgb: 11.4 g/dL — ABNORMAL LOW (ref 12.0–16.0)
Immature Granulocytes Absolute: 0.03 10*3/uL
Immature Granulocytes: 0 %
Lymphocytes Absolute Automated: 1.03 10*3/uL (ref 0.50–4.40)
Lymphocytes Automated: 12 %
MCH: 31.4 pg (ref 28.0–32.0)
MCHC: 32.2 g/dL (ref 32.0–36.0)
MCV: 97.5 fL (ref 80.0–100.0)
MPV: 10.7 fL (ref 9.4–12.3)
Monocytes Absolute Automated: 1.05 10*3/uL (ref 0.00–1.20)
Monocytes: 13 %
Neutrophils Absolute: 6.08 10*3/uL (ref 1.80–8.10)
Neutrophils: 74 %
Platelets: 205 10*3/uL (ref 140–400)
RBC: 3.63 10*6/uL — ABNORMAL LOW (ref 4.20–5.40)
RDW: 15 % (ref 12–15)
WBC: 8.23 10*3/uL (ref 3.50–10.80)

## 2015-03-31 LAB — B-TYPE NATRIURETIC PEPTIDE: B-Natriuretic Peptide: 165.8 pg/mL — ABNORMAL HIGH (ref 0.0–100.0)

## 2015-03-31 LAB — GFR: EGFR: >60

## 2015-03-31 LAB — LACTIC ACID, PLASMA: Lactic Acid: 1.5 mmol/L (ref 0.2–2.0)

## 2015-03-31 MED ORDER — ACETAMINOPHEN 500 MG PO TABS
1000.0000 mg | ORAL_TABLET | Freq: Once | ORAL | Status: AC
Start: 2015-03-31 — End: 2015-03-31
  Administered 2015-03-31: 1000 mg via ORAL
  Filled 2015-03-31: qty 2

## 2015-03-31 MED ORDER — MORPHINE SULFATE 4 MG/ML IJ/IV SOLN (WRAP)
4.0000 mg | Freq: Once | Status: AC
Start: 2015-03-31 — End: 2015-03-31
  Administered 2015-03-31: 4 mg via INTRAVENOUS
  Filled 2015-03-31: qty 1

## 2015-03-31 MED ORDER — ALBUTEROL SULFATE (2.5 MG/3ML) 0.083% IN NEBU
2.5000 mg | INHALATION_SOLUTION | Freq: Once | RESPIRATORY_TRACT | Status: AC
Start: 2015-03-31 — End: 2015-03-31
  Administered 2015-03-31: 2.5 mg via RESPIRATORY_TRACT
  Filled 2015-03-31: qty 3

## 2015-03-31 MED ORDER — ONDANSETRON HCL 4 MG/2ML IJ SOLN
4.0000 mg | Freq: Once | INTRAMUSCULAR | Status: AC
Start: 2015-03-31 — End: 2015-03-31
  Administered 2015-03-31: 4 mg via INTRAVENOUS
  Filled 2015-03-31: qty 2

## 2015-03-31 NOTE — ED Provider Notes (Signed)
Physician/Midlevel provider first contact with patient: 03/31/15 2115         History     Chief Complaint   Patient presents with   . Pelvic Pain   . Leg Swelling     Patient is a 79 y.o. female presenting with abdominal pain. The history is provided by the patient and a relative (daughter).   Abdominal Pain  Pain location: lower abdomen   Pain quality comment:  Pain   Pain radiates to:  Does not radiate  Pain severity:  Moderate  Onset quality:  Gradual  Duration:  6 days  Timing:  Constant  Progression:  Worsening  Chronicity:  New  Context: previous surgery    Context: not recent illness, not sick contacts, not suspicious food intake and not trauma    Relieved by:  Nothing  Worsened by:  Movement (walking )  Ineffective treatments: oral norco   Associated symptoms: no anorexia, no chest pain, no chills, no constipation, no cough, no diarrhea, no dysuria, no hematemesis, no hematochezia, no hematuria, no nausea, no shortness of breath, no sore throat and no vomiting    Risk factors: being elderly    Risk factors: has not had multiple surgeries     79 year female with lower abdominal pain onset 6 days ago, better on Monday and then seen at [facility] on Tuesday for same. Felt pain in the pelvic bones that night. Felt was muscular per daughter due to a lot of walking at the [employer] last weekend but drove here from [facility] on sunday, ct and blood work done, sent home with norco and reports no relief of pain. No fall or trauma. Temp off and on all week, tmax 99.5. Cough with symptoms off and on for one year, dx with asthma last year. Coughs up yellow dark mucus off and on symptoms. Has seen pulmonology fpr cough. No rhinorrhea or sore throat.   No vomiting or diarrhea, lbm 5 days ago but reports is not abnormal, used 1/2 fleets last night and mild bm noted, no blood noted. Burning with urination and has an open area on the labia off and on since 02/2015. Legs swollen off and on per patient and has burning and stinging  to back of legs this week per patient but has had this before. Travel last 6 days ago in car from [facility]     PCP in [place] - lives here part time   Past Medical History   Diagnosis Date   . Hypertension    . Cardiomyopathy    . Atrial fibrillation      with pacer    . Arthritis    . Hypercholesterolemia    . Basal cell carcinoma    no hx of chf per family -has enlarged heart    Past Surgical History   Procedure Laterality Date   . Hysterectomy     . Replacement total knee     pacer with battery replaced 03/14/15 in [place]   Left breast biopsy   Right knee replacement     No family history on file.    Social  History   Substance Use Topics   . Smoking status: Never Smoker    . Smokeless tobacco: Not on file   . Alcohol Use: No   .     Allergies   Allergen Reactions   . Codeine    codeine with n/v     Home Medications  albuterol (PROVENTIL HFA;VENTOLIN HFA) 108 (90 BASE) MCG/ACT inhaler     Inhale 2 puffs into the lungs.     atorvastatin (LIPITOR) 10 MG tablet     Take 10 mg by mouth daily.     Biotin 1000 MCG tablet     Take 1,000 mcg by mouth 3 (three) times daily.     Cholecalciferol (VITAMIN D-3 PO)     Take by mouth.     digoxin (LANOXIN) 0.125 MG tablet     Take 125 mcg by mouth.     furosemide (LASIX) 20 MG tablet     Take 20 mg by mouth 2 (two) times daily.     HYDROcodone-acetaminophen (NORCO) 5-325 MG per tablet     Take 0.5-1 tablets by mouth every 6 (six) hours as needed.     losartan (COZAAR) 25 MG tablet     Take 25 mg by mouth daily.     metoprolol XL (TOPROL-XL) 50 MG 24 hr tablet     Take 50 mg by mouth 2 (two) times daily.     Mometasone Furo-Formoterol Fum (DULERA IN)     Inhale into the lungs.     Potassium (POTASSIMIN PO)     Take by mouth.     rivaroxaban (XARELTO) 20 MG Tab     Take 20 mg by mouth daily with dinner.     traMADol (ULTRAM) 50 MG tablet     Take 50 mg by mouth every 6 (six) hours as needed for Pain.           Review of Systems   Constitutional: Negative for chills.    HENT: Negative for congestion, rhinorrhea and sore throat.    Respiratory: Negative for cough and shortness of breath.    Cardiovascular: Negative for chest pain.   Gastrointestinal: Positive for abdominal pain. Negative for nausea, vomiting, diarrhea, constipation, hematochezia, anorexia and hematemesis.   Genitourinary: Negative for dysuria and hematuria.   Musculoskeletal: Positive for gait problem. Negative for back pain and neck pain.   Neurological: Negative for weakness, numbness and headaches.   All other systems reviewed and are negative.      Physical Exam    BP: 143/63 mmHg, Heart Rate: 81, Temp: 99.2 F (37.3 C), Resp Rate: 18, SpO2: 97 %, Weight: 96.163 kg    Physical Exam   Constitutional: She is oriented to person, place, and time. She appears well-developed and well-nourished. No distress.   HENT:   Head: Normocephalic and atraumatic.   Nose: Nose normal.   Mouth/Throat: Oropharynx is clear and moist. No oropharyngeal exudate.   Eyes: EOM are normal. Pupils are equal, round, and reactive to light.   Neck: Normal range of motion. Neck supple.   Cardiovascular: Normal rate, regular rhythm and normal heart sounds.    No murmur heard.  Pulmonary/Chest: Effort normal and breath sounds normal. No respiratory distress. She has no wheezes. She has no rales.   Abdominal: Soft. Bowel sounds are normal. She exhibits no distension. There is no tenderness.   Genitourinary:         Musculoskeletal:        Right hip: She exhibits decreased range of motion and tenderness.        Left hip: She exhibits decreased range of motion and tenderness.        Right lower leg: She exhibits edema. She exhibits no tenderness.        Left lower leg: She exhibits edema. She exhibits no tenderness.  Edema to le without calf tenderness   Pain with movement and ambulation   Evidence of venous stasis changes   Normal peripheral pulses    Lymphadenopathy:     She has no cervical adenopathy.   Neurological: She is alert and oriented  to person, place, and time. No cranial nerve deficit. She exhibits normal muscle tone. Coordination normal.   Skin: Skin is warm and dry. She is not diaphoretic. No erythema.   Psychiatric: She has a normal mood and affect. Her behavior is normal.   Nursing note and vitals reviewed.        MDM and ED Course     ED Medication Orders     Start Ordered     Status Ordering Provider    04/01/15 [phone] 04/01/15 0028  morphine injection 4 mg   Once     Route: Intravenous  Ordered Dose: 4 mg     Last MAR action:  Given Reginia Forts    03/31/15 2148 03/31/15 2147  acetaminophen (TYLENOL) tablet 1,000 mg   Once     Route: Oral  Ordered Dose: 1,000 mg     Last MAR action:  Given Reginia Forts    03/31/15 2141 03/31/15 2140  albuterol (PROVENTIL) nebulizer solution 2.5 mg   RT - Once     Route: Nebulization  Ordered Dose: 2.5 mg     Last MAR action:  Given Reginia Forts    03/31/15 2138 03/31/15 2137  morphine injection 4 mg   Once     Route: Intravenous  Ordered Dose: 4 mg     Last MAR action:  Given Tanna Savoy [facility] J    03/31/15 2138 03/31/15 2137  ondansetron (ZOFRAN) injection 4 mg   Once     Route: Intravenous  Ordered Dose: 4 mg     Last MAR action:  Given Rina Adney J              MDM  Number of Diagnoses or Management Options  Anticoagulated:   Fever, unspecified fever cause:   History of atrial fibrillation:   Labial abrasion, initial encounter:   Pelvic pain:   Peripheral venous insufficiency:   Diagnosis management comments: Dr. Marlane Hatcher  is the primary attending for this patient and has obtained and performed the history, PE, and medical decision making for this patient.    Oxygen saturation by pulse oximetry is 95%-100%, Normal.  Interventions: None Needed and Patient Observed.    EKG Interpretation  EKG interpreted by EDP  Compared to prior EKG dated none  Rate: Normal  Rhythm: atrial fibrillation  Axis: Left  ST Segments: Normal ST segments  Conduction: No blocks  Impression: Normal EKG  Reginia Forts,  MD   2145      Given the acute clinical presentation of pelvic pain and inability to ambulate. Therefore pt warrants admission. Pt's condition can be life-threatening.    I spoke to hospitalist regarding admission. Dr Ricka Burdock     Results     Procedure Component Value Units Date/Time    Blood Culture Aerobic/Anaerobic #2 ([id number]) Collected:  03/31/15 2143    Specimen Information:  Arm / Blood Updated:  04/01/15 0034    Narrative:      1 BLUE+1 PURPLE    Blood Culture Aerobic/Anaerobic #1 ([id number]) Collected:  03/31/15 2143    Specimen Information:  Arm / Blood Updated:  04/01/15 0034    Narrative:      1 BLUE+1 PURPLE  Creatinine Kinase (CK) (NON CARDIAC) ([id number]) Collected:  03/31/15   2143     Creatine Kinase (CK) 54 U/L Updated:  04/01/15 0023    UA with reflex to micro ([id number])  (Abnormal) Collected:  03/31/15 2357      Specimen Information:  Urine Updated:  04/01/15 0014     Urine Type Clean Catch      Color, UA YELLOW      Clarity, UA CLOUDY (A)      Specific Gravity UA 1.020      Urine pH 7.5      Leukocyte Esterase, UA NEGATIVE      Nitrite, UA NEGATIVE      Protein, UR NEGATIVE      Glucose, UA NEGATIVE      Ketones UA NEGATIVE      Urobilinogen, UA 4.0 (A) mg/dL      Bilirubin, UA NEGATIVE      Blood, UA TRACE-INTACT (A)      RBC, UA 0 - 5 /hpf      WBC, UA 0 - 5 /hpf      Squamous Epithelial Cells, Urine 11 - 25 /hpf      Urine Amorphous Many (A) /hpf      Urine Mucus Present     Narrative:      Reason for not entering the date/time of last dose:->Unknown  Last dose    Rapid influenza A/B antigens ([id number]) Collected:  03/31/15 2313    Specimen Information:  Nasopharyngeal / Nasal Aspirate Updated:  03/31/15   2341    Narrative:      ORDER#: [id number]                                    ORDERED BY: Marlane Hatcher  SOURCE: Nasal Aspirate                               COLLECTED:  03/31/15   23:13  ANTIBIOTICS AT COLL.:                                RECEIVED :  03/31/15   23:26  Influenza  Rapid Antigen A&B                FINAL       03/31/15 23:41  03/31/15   Negative for Influenza A and B             Reference Range: Negative     Digoxin Level ([id number])  (Abnormal) Collected:  03/31/15 2143    Specimen Information:  Blood Updated:  03/31/15 2229     Digoxin Level 0.38 (L) ng/mL      Digoxin Date of Last Dose /Unknown      Digoxin Time of Last Dose UNKNOWN     Narrative:      Reason for not entering the date/time of last dose:->Unknown  Last dose    Comprehensive metabolic panel ([id number])  (Abnormal) Collected:    03/31/15 2143    Specimen Information:  Blood Updated:  03/31/15 2225     Glucose 112 (H) mg/dL      BUN 53.6 mg/dL      Creatinine 0.8 mg/dL      Sodium 644 mEq/L      Potassium 3.7 mEq/L  Chloride 104 mEq/L      CO2 24 mEq/L      CALCIUM 9.0 mg/dL      Protein, Total 6.8 g/dL      Albumin 3.1 (L) g/dL      AST (SGOT) 24 U/L      ALT 12 U/L      Alkaline Phosphatase 104 U/L      Bilirubin, Total 0.9 mg/dL      Globulin 3.7 (H) g/dL      Albumin/Globulin Ratio 0.8 (L)      Anion Gap 10.0     Narrative:      Reason for not entering the date/time of last dose:->Unknown  Last dose    GFR ([id number]) Collected:  03/31/15 2143     EGFR >60.0 Updated:  03/31/15 2225    Narrative:      Reason for not entering the date/time of last dose:->Unknown  Last dose    PT/APTT ([id number])  (Abnormal) Collected:  03/31/15 2143     PT 38.0 (HH) sec Updated:  03/31/15 2214     PT INR 4.0      PT Anticoag. Given Within 48 hrs. --      Result:        None  rivaroxaba      PTT 56 (H) sec     Narrative:      Reason for not entering the date/time of last dose:->Unknown  Last dose    B-type Natriuretic Peptide ([id number])  (Abnormal) Collected:  03/31/15   2143    Specimen Information:  Blood Updated:  03/31/15 2210     B-Natriuretic Peptide 165.8 (H) pg/mL     Narrative:      Reason for not entering the date/time of last dose:->Unknown  Last dose    Lactic acid ([id number]) Collected:  03/31/15 2143     Specimen Information:  Blood Updated:  03/31/15 2203     Lactic acid 1.5 mmol/L     CBC with differential ([id number])  (Abnormal) Collected:  03/31/15 2143    Specimen Information:  Blood / Blood Updated:  03/31/15 2150     WBC 8.23 x10 3/uL      Hgb 11.4 (L) g/dL      Hematocrit 10.2 (L) %      Platelets 205 x10 3/uL      RBC 3.63 (L) x10 6/uL      MCV 97.5 fL      MCH 31.4 pg      MCHC 32.2 g/dL      RDW 15 %      MPV 10.7 fL      Neutrophils 74 %      Lymphocytes Automated 12 %      Monocytes 13 %      Eosinophils Automated 1 %      Basophils Automated 0 %      Immature Granulocyte 0 %      Neutrophils Absolute 6.08 x10 3/uL      Abs Lymph Automated 1.03 x10 3/uL      Abs Mono Automated 1.05 x10 3/uL      Abs Eos Automated 0.06 x10 3/uL      Absolute Baso Automated 0.01 x10 3/uL      Absolute Immature Granulocyte 0.03 x10 3/uL     Narrative:      Reason for not entering the date/time of last dose:->Unknown  Last dose        Radiology Results (24 Hour)  Procedure Component Value Units Date/Time    CT Boney Pelvis ([id number]) Collected:  04/01/15 0127    Order Status:  Completed Updated:  04/01/15 0133    Narrative:      TECHNIQUE: CT pelvis WITHOUT contrast. Sagittal and coronal  reconstructed images were obtained.    INDICATION: Bilateral hip pain.    COMPARISON: Hip radiographs dated 03/31/2015.     FINDINGS:     No acute fracture or dislocation.    Small osteophytes are seen at both hips with no significant joint space  narrowing consistent with minimal degenerative changes. Mild  degenerative changes of the bilateral sacroiliac joints. Mild  degenerative changes of the lumbar spine.     Impression:       No acute abnormality.    Johnsie Kindred, MD   04/01/2015 1:29 AM     Hip Left AP and Lateral ([id number]) Collected:  03/31/15 2257    Order Status:  Completed Updated:  03/31/15 2302    Narrative:      History: Hip pain.    FINDINGS: AP and frog-leg views were obtained of the left hip and  pelvis.    There is  minimal degenerative spur formation along the posterior  acetabula bilaterally. There is no evidence of a fracture or a focal  bone lesion.     Impression:       1. Minimal degenerative changes in both hips.  2. No fracture is seen.    Lanier Ensign, MD   03/31/2015 10:58 PM     Hip Right AP and Lateral ([id number]) Collected:  03/31/15 2255    Order Status:  Completed Updated:  03/31/15 2301    Narrative:      History: Pain.    FINDINGS: AP and frog-leg views were obtained of the right hip and  pelvis.    There is no evidence of a fracture or a focal bone lesion. There is mild  spur formation along the posterior acetabula bilaterally. The sacral  iliac joints show minimal degenerative change.     Impression:       Minimal degenerative changes in the hips and sacroiliac joints.    Lanier Ensign, MD   03/31/2015 10:57 PM     XR Chest  AP Portable ([id number]) Collected:  03/31/15 2225    Order Status:  Completed Updated:  03/31/15 2230    Narrative:      History: Cough and shortness of breath.    FINDINGS: An AP portable upright view was obtained.    The lungs are clear with no evidence of infiltrates, edema, or fibrosis.  There is no evidence of a pleural effusion or pneumothorax. Cardiomegaly  is noted. A pacemaker is in place. There are degenerative changes in the  thoracic spine.     Impression:       No active disease in the chest.    Lanier Ensign, MD   03/31/2015 10:26 PM     [place] VenoDopp Low Extremity Bilateral ([id number]) Collected:  03/31/15 2225      Order Status:  Completed Updated:  03/31/15 2229    Narrative:      History: Bilateral leg pain.    FINDINGS: Color flow and spectral doppler evaluation of the deep venous  system of both lower extremities was performed. On each side the common  femoral vein, superficial femoral vein, and popliteal vein were  visualized and appear unremarkable.  These veins are compressible with  no evidence of thrombus.  Doppler demonstrates patency and normal  directional and  phasic flow.  Appropriate augmentation was observed.   The proximal deep femoral vein is visualized and appears unremarkable.   The portions of the posterior tibial and peroneal veins that were seen  appear unremarkable with no thrombus noted.  There is no indirect  evidence of a calf vein thrombus.     Impression:       No evidence of deep vein thrombosis involving either lower extremity.    Lanier Ensign, MD   03/31/2015 10:25 PM    Discussed with patient and daughter about testing results and admit   Breath sounds clear on recheck.        Amount and/or Complexity of Data Reviewed  Clinical lab tests: ordered and reviewed  Tests in the radiology section of CPT: ordered and reviewed    Patient Progress  Patient progress: stable         Procedures    Clinical Impression & Disposition     Clinical Impression  Final diagnoses:   Pelvic pain   Fever, unspecified fever cause   Labial abrasion, initial encounter   History of atrial fibrillation   Anticoagulated   Peripheral venous insufficiency        ED Disposition     Admit Bed Type: Telemetry [5]  Admitting Physician: Dorthula Nettles [[id number]]  Patient Class: Observation [104]  Special Needs:: Fall Risk             New Prescriptions    No medications on file                   Cherilyn Sautter, Lindalou Hose, MD  04/01/15 0400

## 2015-03-31 NOTE — ED Notes (Addendum)
Pt reports pain in plevic regions since monday. Pt reports bilateral lower leg swelling and burning. Daughter reports low grade fevers all week. Pt denies injury. Pt reports she has a "bump" on labia that is causing pain with urination and palpation.

## 2015-04-01 ENCOUNTER — Emergency Department: Payer: Medicare (Managed Care)

## 2015-04-01 ENCOUNTER — Observation Stay: Payer: Medicare (Managed Care)

## 2015-04-01 DIAGNOSIS — I4891 Unspecified atrial fibrillation: Secondary | ICD-10-CM | POA: Diagnosis present

## 2015-04-01 DIAGNOSIS — I429 Cardiomyopathy, unspecified: Secondary | ICD-10-CM | POA: Diagnosis present

## 2015-04-01 DIAGNOSIS — R102 Pelvic and perineal pain: Secondary | ICD-10-CM | POA: Diagnosis present

## 2015-04-01 DIAGNOSIS — I10 Essential (primary) hypertension: Secondary | ICD-10-CM | POA: Diagnosis present

## 2015-04-01 DIAGNOSIS — E785 Hyperlipidemia, unspecified: Secondary | ICD-10-CM | POA: Diagnosis present

## 2015-04-01 DIAGNOSIS — I480 Paroxysmal atrial fibrillation: Secondary | ICD-10-CM | POA: Insufficient documentation

## 2015-04-01 LAB — CBC AND DIFFERENTIAL
Basophils Absolute Automated: 0.03 10*3/uL (ref 0.00–0.20)
Basophils Automated: 0 %
Eosinophils Absolute Automated: 0.05 10*3/uL (ref 0.00–0.70)
Eosinophils Automated: 1 %
Hematocrit: 33.5 % — ABNORMAL LOW (ref 37.0–47.0)
Hgb: 10.6 g/dL — ABNORMAL LOW (ref 12.0–16.0)
Immature Granulocytes Absolute: 0.01 10*3/uL
Immature Granulocytes: 0 %
Lymphocytes Absolute Automated: 1.31 10*3/uL (ref 0.50–4.40)
Lymphocytes Automated: 19 %
MCH: 30.9 pg (ref 28.0–32.0)
MCHC: 31.6 g/dL — ABNORMAL LOW (ref 32.0–36.0)
MCV: 97.7 fL (ref 80.0–100.0)
MPV: 10 fL (ref 9.4–12.3)
Monocytes Absolute Automated: 0.92 10*3/uL (ref 0.00–1.20)
Monocytes: 13 %
Neutrophils Absolute: 4.59 10*3/uL (ref 1.80–8.10)
Neutrophils: 67 %
Platelets: 161 10*3/uL (ref 140–400)
RBC: 3.43 10*6/uL — ABNORMAL LOW (ref 4.20–5.40)
RDW: 15 % (ref 12–15)
WBC: 6.9 10*3/uL (ref 3.50–10.80)

## 2015-04-01 LAB — COMPREHENSIVE METABOLIC PANEL
ALT: 12 U/L (ref 0–55)
AST (SGOT): 19 U/L (ref 5–34)
Albumin/Globulin Ratio: 0.9 (ref 0.9–2.2)
Albumin: 2.8 g/dL — ABNORMAL LOW (ref 3.5–5.0)
Alkaline Phosphatase: 90 U/L (ref 37–106)
Anion Gap: 10 (ref 5.0–15.0)
BUN: 10.4 mg/dL (ref 7.0–19.0)
Bilirubin, Total: 1.1 mg/dL (ref 0.2–1.2)
CO2: 24 mEq/L (ref 22–29)
Calcium: 8.4 mg/dL (ref 7.9–10.2)
Chloride: 107 mEq/L (ref 100–111)
Creatinine: 0.7 mg/dL (ref 0.6–1.0)
Globulin: 3.2 g/dL (ref 2.0–3.6)
Glucose: 90 mg/dL (ref 70–100)
Potassium: 3.8 mEq/L (ref 3.5–5.1)
Protein, Total: 6 g/dL (ref 6.0–8.3)
Sodium: 141 mEq/L (ref 136–145)

## 2015-04-01 LAB — URINALYSIS, REFLEX TO MICROSCOPIC EXAM IF INDICATED
Bilirubin, UA: NEGATIVE
Glucose, UA: NEGATIVE
Ketones UA: NEGATIVE
Leukocyte Esterase, UA: NEGATIVE
Nitrite, UA: NEGATIVE
Protein, UR: NEGATIVE
Specific Gravity UA: 1.02 (ref 1.001–1.035)
Urine pH: 7.5 (ref 5.0–8.0)
Urobilinogen, UA: 4 mg/dL — AB (ref 0.2–2.0)

## 2015-04-01 LAB — PT AND APTT
PT INR: 3.4
PT: 33.3 s (ref 12.6–15.0)
PTT: 46 s — ABNORMAL HIGH (ref 23–37)

## 2015-04-01 LAB — GFR: EGFR: >60

## 2015-04-01 LAB — B-TYPE NATRIURETIC PEPTIDE: B-Natriuretic Peptide: 179.6 pg/mL — ABNORMAL HIGH (ref 0.0–100.0)

## 2015-04-01 LAB — CREATINE KINASE W/O REFLEX (SOFT): Creatine Kinase (CK): 54 U/L (ref 29–168)

## 2015-04-01 LAB — C-REACTIVE PROTEIN: C-Reactive Protein: 7.5 mg/dL — ABNORMAL HIGH (ref 0.0–0.8)

## 2015-04-01 LAB — MAGNESIUM: Magnesium: 2.1 mg/dL (ref 1.6–2.6)

## 2015-04-01 LAB — SEDIMENTATION RATE: Sed Rate: 79 mm/Hr — ABNORMAL HIGH (ref 0–20)

## 2015-04-01 MED ORDER — DIGOXIN 125 MCG PO TABS
125.0000 ug | ORAL_TABLET | Freq: Every day | ORAL | Status: DC
Start: 2015-04-01 — End: 2015-04-07
  Administered 2015-04-01 – 2015-04-07 (×7): 125 ug via ORAL
  Filled 2015-04-01 (×7): qty 1

## 2015-04-01 MED ORDER — METOPROLOL SUCCINATE ER 50 MG PO TB24
50.0000 mg | ORAL_TABLET | Freq: Two times a day (BID) | ORAL | Status: DC
Start: 2015-04-01 — End: 2015-04-07
  Administered 2015-04-01 – 2015-04-07 (×13): 50 mg via ORAL
  Filled 2015-04-01 (×13): qty 1

## 2015-04-01 MED ORDER — ALBUTEROL SULFATE (2.5 MG/3ML) 0.083% IN NEBU
2.5000 mg | INHALATION_SOLUTION | Freq: Four times a day (QID) | RESPIRATORY_TRACT | Status: DC | PRN
Start: 2015-04-01 — End: 2015-04-07

## 2015-04-01 MED ORDER — ACETAMINOPHEN 325 MG PO TABS
650.0000 mg | ORAL_TABLET | ORAL | Status: DC | PRN
Start: 2015-04-01 — End: 2015-04-07

## 2015-04-01 MED ORDER — DOCUSATE SODIUM 100 MG PO CAPS
100.0000 mg | ORAL_CAPSULE | Freq: Two times a day (BID) | ORAL | Status: DC
Start: 2015-04-01 — End: 2015-04-07
  Administered 2015-04-01 – 2015-04-05 (×7): 100 mg via ORAL
  Filled 2015-04-01 (×13): qty 1

## 2015-04-01 MED ORDER — MORPHINE SULFATE 2 MG/ML IJ/IV SOLN (WRAP)
2.0000 mg | Status: DC | PRN
Start: 2015-04-01 — End: 2015-04-07

## 2015-04-01 MED ORDER — CYCLOBENZAPRINE HCL 10 MG PO TABS
10.0000 mg | ORAL_TABLET | Freq: Three times a day (TID) | ORAL | Status: DC | PRN
Start: 2015-04-01 — End: 2015-04-07
  Administered 2015-04-01: 10 mg via ORAL
  Filled 2015-04-01: qty 1

## 2015-04-01 MED ORDER — SODIUM CHLORIDE 0.9 % IV SOLN
INTRAVENOUS | Status: DC
Start: 2015-04-01 — End: 2015-04-01

## 2015-04-01 MED ORDER — ATORVASTATIN CALCIUM 10 MG PO TABS
10.0000 mg | ORAL_TABLET | Freq: Every day | ORAL | Status: DC
Start: 2015-04-01 — End: 2015-04-07
  Administered 2015-04-01 – 2015-04-07 (×7): 10 mg via ORAL
  Filled 2015-04-01 (×7): qty 1

## 2015-04-01 MED ORDER — FUROSEMIDE 20 MG PO TABS
20.0000 mg | ORAL_TABLET | Freq: Two times a day (BID) | ORAL | Status: DC
Start: 2015-04-01 — End: 2015-04-07
  Administered 2015-04-01 – 2015-04-07 (×13): 20 mg via ORAL
  Filled 2015-04-01 (×13): qty 1

## 2015-04-01 MED ORDER — LOSARTAN POTASSIUM 25 MG PO TABS
25.0000 mg | ORAL_TABLET | Freq: Every day | ORAL | Status: DC
Start: 2015-04-01 — End: 2015-04-07
  Administered 2015-04-01 – 2015-04-07 (×7): 25 mg via ORAL
  Filled 2015-04-01 (×7): qty 1

## 2015-04-01 MED ORDER — SENNA 8.6 MG PO TABS
8.6000 mg | ORAL_TABLET | Freq: Every evening | ORAL | Status: DC
Start: 2015-04-01 — End: 2015-04-07
  Administered 2015-04-01 – 2015-04-02 (×2): 8.6 mg via ORAL
  Filled 2015-04-01 (×6): qty 1

## 2015-04-01 MED ORDER — MORPHINE SULFATE 2 MG/ML IJ/IV SOLN (WRAP)
2.0000 mg | Status: DC | PRN
Start: 2015-04-01 — End: 2015-04-01

## 2015-04-01 MED ORDER — TRAMADOL HCL 50 MG PO TABS
50.0000 mg | ORAL_TABLET | Freq: Four times a day (QID) | ORAL | Status: DC | PRN
Start: 2015-04-01 — End: 2015-04-01
  Filled 2015-04-01: qty 1

## 2015-04-01 MED ORDER — RIVAROXABAN 20 MG PO TABS
20.0000 mg | ORAL_TABLET | Freq: Every day | ORAL | Status: DC
Start: 2015-04-01 — End: 2015-04-04
  Administered 2015-04-01 – 2015-04-04 (×4): 20 mg via ORAL
  Filled 2015-04-01 (×4): qty 1

## 2015-04-01 MED ORDER — MORPHINE SULFATE 4 MG/ML IJ/IV SOLN (WRAP)
4.0000 mg | Freq: Once | Status: AC
Start: 2015-04-01 — End: 2015-04-01
  Administered 2015-04-01: 4 mg via INTRAVENOUS
  Filled 2015-04-01: qty 1

## 2015-04-01 MED ORDER — HYDROCODONE-ACETAMINOPHEN 5-325 MG PO TABS
0.5000 | ORAL_TABLET | Freq: Four times a day (QID) | ORAL | Status: DC | PRN
Start: 2015-04-01 — End: 2015-04-07
  Administered 2015-04-01 – 2015-04-06 (×8): 1 via ORAL
  Filled 2015-04-01 (×8): qty 1

## 2015-04-01 MED ORDER — METHYLPREDNISOLONE SODIUM SUCC 40 MG IJ SOLR
40.0000 mg | Freq: Two times a day (BID) | INTRAMUSCULAR | Status: DC
Start: 2015-04-01 — End: 2015-04-07
  Administered 2015-04-01 – 2015-04-07 (×13): 40 mg via INTRAVENOUS
  Filled 2015-04-01 (×13): qty 1

## 2015-04-01 MED ORDER — SODIUM CHLORIDE 0.9 % IV MBP
2.0000 g | INTRAVENOUS | Status: AC
Start: 2015-04-01 — End: 2015-04-05
  Administered 2015-04-01 – 2015-04-05 (×5): 2 g via INTRAVENOUS
  Filled 2015-04-01 (×5): qty 2000

## 2015-04-01 NOTE — Plan of Care (Signed)
Problem: Safety  Goal: Patient will be free from injury during hospitalization  Outcome: Progressing  Fall risk assessment q shift. Call bell, overhead table, and assistive devices within reach. Pt instructed to call for assistance. Bed alarm activated. Bed in lowest position.  Intervention: Assess patient's risk for falls and implement fall prevention plan of care per policy  Fall risk assessment q shift. Call bell, overhead table, and assistive devices within reach. Pt instructed to call for assistance. Bed alarm activated. Bed in lowest position.  Intervention: Provide and maintain safe environment  Fall risk assessment q shift. Call bell, overhead table, and assistive devices within reach. Pt instructed to call for assistance. Bed alarm activated. Bed in lowest position.  Intervention: Use appropriate transfer methods  Assistive devices within reach.  Intervention: Ensure appropriate safety devices are available at the bedside  Fall risk assessment q shift. Call bell, overhead table, and assistive devices within reach. Pt instructed to call for assistance. Bed alarm activated. Bed in lowest position.  Intervention: Include patient/family/caregiver in decisions related to safety  .  Intervention: Hourly rounding.  .  Intervention: Assess for patient's risk for elopement and implement Elopement Risk plan per policy  .      Problem: Pain  Goal: Patient's pain/discomfort is manageable  Outcome: Progressing  Pain assessment q shift and prn.  Intervention: Include patient/family/caregiver in decisions related to pain management  .  Intervention: Offer non-pharmocological pain management interventions  .      Problem: Moderate/High Fall Risk Score >5  Goal: Patient will remain free of falls  Outcome: Progressing  Fall risk assessment q shift. Call bell, overhead table, and assistive devices within reach. Pt instructed to call for assistance. Bed alarm activated. Bed in lowest position.

## 2015-04-01 NOTE — Plan of Care (Addendum)
I have seen and examined the patient and reviewed lab data and imaging reports.  Patient describes pelvic pain as b/l groin pain, lower back pain shooting down to left posterior thigh and calf region. Likely lumbar stenosis with nerve impingement. Denies urinary or bladder incontinence.  No saddle anesthesia.  Motor strength of b/l lower extremities is +5/5.  MRI lumbar spine can not be done due to pacemaker.  Will order X-ray of L-spine, start Morphine IV prn severe pain, Flexeril scheduled and low dose steroid.  PT/OT evaluation tomorrow once pain controlled.

## 2015-04-01 NOTE — PT Progress Note (Signed)
Physical Therapy Cancellation Note    Patient: Bethany Howell  UEA:[id number]    Unit: M211/M211-B    Patient not seen for physical therapy secondary to Dr. Murvin Natal documenting today:  PT/OT evaluation tomorrow once pain controlled.         Verified with RN and family- per family and pt, Dr. Murvin Natal wants steroids and pain meds on board today and PT eval tomorrow. Daughter, Laveda Abbe, at the bedside and requesting that if for some reason she's not here tomorrow during PT eval, she'd like a phone call with an update on PT evaluation.

## 2015-04-01 NOTE — Progress Notes (Signed)
Informed by lab of positive blood culture for gram positive cocci resembling strep. Informed charge RN Fannie Knee via phone who informed Dr. Murvin Natal via page to RN's phone. New orders received. Will continue to monitor.

## 2015-04-01 NOTE — Progress Notes (Signed)
Informed by lab of second positive blood culture showing gram positive cocci resembling strep. Informed Dr. Murvin Natal via page. No new orders. Will continue to monitor.

## 2015-04-01 NOTE — H&P (Signed)
[facility] HOSPITALIST  H&P    Patient Info:   Date Time: 04/01/2015  2:08 AM   Patient Name:Bethany Howell   ZHY:[id number]    PCP: Christa See, MD   Admit Date:03/31/2015   Attending Physician: Dorthula Nettles, MD      Assessment and Plan:   1.  Pelvic pain: Unknown etiology, possible lumbar disc disease, unable to obtain MRI due to pacemaker, continue pain control as needed for now, obtaining PT/OT evaluation, consider obtaining orthopedic surgery consult  2.  Atrial fibrillation: Currently in atrial fibrillation, stable, continue digoxin, Toprol, Xarelto, monitor on telemetry  3.  Cardiomyopathy: Continue Lasix, Cozaar, Toprol, strict I's and O's, monitor on telemetry  4.  Hypertension: Continue Cozaar, Toprol  5.  Hyperlipidemia: Continue Lipitor           DVT Prohylaxis:SEDs and xarelto    Code Status: No Order   Disposition:home   Condition on admission and Prognosis: Guarded    Type of Admission: Observation    Estimated Length of Stay (including stay in the ER receiving treatment): Less than 2 days    Medical Necessity for stay: Pelvic pain        Clinical Presentation   History of Presenting Illness:   Bethany Howell is a 79 y.o. female who has history of   Past Surgical History   Procedure Laterality Date   . Hysterectomy     . Replacement total knee      Past Medical History   Diagnosis Date   . Hypertension    . Cardiomyopathy    . Atrial fibrillation      with pacer    . Arthritis    . Hypercholesterolemia    . Basal cell carcinoma     came with the chief complaint of pelvic pain since Monday.  Pelvic pain was sudden onset, no specific trigger, felt as burning/cramping, radiating to bilateral legs, accompanied with tingling/numbness on bilateral legs, worsened with standing or walking.  Patient denies any loss of bowel or bladder control. Patient normally ambulates without any walker or cane.  Patient denies any specific trauma or injury or recent surgery.  Patient does have chronic  coughing with some phlegm since last year, unchanged.  Patient was also noticed to have low-grade fever as well of 99.6. Patient denies any Patient denies any headache, lightheadedness, fever, chills, chest pain, palpitation, shortness of breath, coughing, nausea, vomiting, abdominal pain, diarrhea, blood in stool, urinary symptoms.   Review of Systems:  Review of Systems   Constitutional: Positive for fever. Negative for chills, weight loss, malaise/fatigue and diaphoresis.   HENT: Negative for congestion, ear pain, hearing loss, nosebleeds, sore throat and tinnitus.    Eyes: Negative for blurred vision, double vision, photophobia and pain.   Respiratory: Positive for cough. Negative for hemoptysis, sputum production, shortness of breath, wheezing and stridor.    Cardiovascular: Negative for chest pain, palpitations, orthopnea, claudication, leg swelling and PND.   Gastrointestinal: Negative for heartburn, nausea, vomiting, abdominal pain, diarrhea, blood in stool and melena.   Genitourinary: Negative for dysuria, urgency, frequency, hematuria and flank pain.   Musculoskeletal: Positive for joint pain. Negative for myalgias, back pain, falls and neck pain.   Skin: Negative for itching and rash.   Neurological: Positive for sensory change. Negative for dizziness, tingling, tremors, speech change, focal weakness, seizures, loss of consciousness and headaches.   Endo/Heme/Allergies: Negative for polydipsia. Does not bruise/bleed easily.   Psychiatric/Behavioral: Negative for depression, suicidal ideas, hallucinations and substance  abuse.      Vitals:   Vitals reviewed height is 1.651 m (5\' 5" ) and weight is 96.163 kg (212 lb). Her oral temperature is 99.6 F (37.6 C). Her blood pressure is 139/63 and her pulse is 90. Her respiration is 15 and oxygen saturation is 97%. Body mass index is 35.28 kg/(m^2).  Filed Vitals:    03/31/15 2033 03/31/15 2314 04/01/15 0032 04/01/15 0106   BP: 143/63 121/55 136/60 139/63   Pulse:  81 84 90 90   Temp: 99.2 F (37.3 C) 99.6 F (37.6 C)     TempSrc:  Oral     Resp: 18 15 17 15    Height: 1.651 m (5\' 5" )      Weight: 96.163 kg (212 lb)      SpO2: 97% 97% 97% 97%     Intake and Output Summary (Last 24 hours) at Date Time No intake or output data in the 24 hours ending 04/01/15 0208   Physical Exam:   Physical Exam   Constitutional: She is oriented to person, place, and time and well-developed, well-nourished, and in no distress. No distress.   HENT:   Head: Normocephalic and atraumatic.   Mouth/Throat: Oropharynx is clear and moist. No oropharyngeal exudate.   Eyes: Conjunctivae and EOM are normal. Pupils are equal, round, and reactive to light. No scleral icterus.   Neck: Normal range of motion. Neck supple. No JVD present. No tracheal deviation present. No thyromegaly present.   Cardiovascular: Normal rate, normal heart sounds and intact distal pulses.  Exam reveals no gallop and no friction rub.    No murmur heard.  Irregular rhythm   Pulmonary/Chest: Effort normal and breath sounds normal. No stridor. No respiratory distress. She has no wheezes. She has no rales. She exhibits no tenderness.   Abdominal: Soft. Bowel sounds are normal. She exhibits no distension and no mass. There is no tenderness. There is no rebound and no guarding.   Musculoskeletal: She exhibits tenderness. She exhibits no edema.   Positive pelvic tenderness with hip abduction bilaterally   Lymphadenopathy:     She has no cervical adenopathy.   Neurological: She is alert and oriented to person, place, and time. She has normal reflexes. She displays normal reflexes. No cranial nerve deficit. She exhibits normal muscle tone. GCS score is 15.   No cranial nerve deficit, no motoric or sensory deficit.   Skin: Skin is warm. No rash noted. She is not diaphoretic. No erythema.   Psychiatric: Mood, affect and judgment normal.   Vitals reviewed.         Clinical Information   Chief Complaint:  Chief Complaint   Patient presents with    . Pelvic Pain   . Leg Swelling      Past Medical History:  Past Medical History   Diagnosis Date   . Hypertension    . Cardiomyopathy    . Atrial fibrillation      with pacer    . Arthritis    . Hypercholesterolemia    . Basal cell carcinoma       Past Surgical History:  Past Surgical History   Procedure Laterality Date   . Hysterectomy     . Replacement total knee        Family History: Denies family history of CVA or coronary artery disease.     Social History:  History   Alcohol Use No     History   Drug Use No  History   Smoking status   . Never Smoker    Smokeless tobacco   . Not on file     History     Social History   . Marital Status: Widowed     Spouse Name: N/A   . Number of Children: N/A   . Years of Education: N/A     Social History Main Topics   . Smoking status: Never Smoker    . Smokeless tobacco: Not on file   . Alcohol Use: No   . Drug Use: No   . Sexual Activity: Not on file     Other Topics Concern   . Not on file     Social History Narrative      Allergies:  Allergies   Allergen Reactions   . Codeine       Medications:  (Not in a hospital admission)       Results of Labs/imaging   Labs have been reviewed:   Coagulation Profile:   Recent Labs  Lab 03/31/15  2143   PT 38.0*   PT INR 4.0   PTT 56*        CBC review:   Recent Labs  Lab 03/31/15  2143 03/27/15  1816   WBC 8.23 6.75   HGB 11.4* 11.0*   HEMATOCRIT 35.4* 35.7*   PLATELETS 205 140   MCV 97.5 100.0   RDW 15 15   NEUTROPHILS 74  --    SEGMENTED NEUTROPHILS  --  81   LYMPHOCYTES AUTOMATED 12  --    EOSINOPHILS AUTOMATED 1  --    IMMATURE GRANULOCYTE 0  --    NEUTROPHILS ABSOLUTE 6.08  --    ABSOLUTE IMMATURE GRANULOCYTE 0.03  --       Chem Review:  Recent Labs  Lab 03/31/15  2143 03/27/15  1816   SODIUM 138 138   POTASSIUM 3.7 4.0   CHLORIDE 104 105   CO2 24 25   BUN 12.8 20.0*   CREATININE 0.8 0.8   GLUCOSE 112* 99   CALCIUM 9.0 8.8   BILIRUBIN, TOTAL 0.9 1.9*   AST (SGOT) 24 21   ALT 12 16   ALKALINE PHOSPHATASE 104 89      Results      Procedure Component Value Units Date/Time    Blood Culture Aerobic/Anaerobic #2 [[id number]] Collected:  03/31/15 2143    Specimen Information:  Arm / Blood Updated:  04/01/15 0034    Narrative:      1 BLUE+1 PURPLE    Blood Culture Aerobic/Anaerobic #1 [[id number]] Collected:  03/31/15 2143    Specimen Information:  Arm / Blood Updated:  04/01/15 0034    Narrative:      1 BLUE+1 PURPLE    Creatinine Kinase (CK) (NON CARDIAC) [[id number]] Collected:  03/31/15 2143     Creatine Kinase (CK) 54 U/L Updated:  04/01/15 0023    UA with reflex to micro [[id number]]  (Abnormal) Collected:  03/31/15 2357    Specimen Information:  Urine Updated:  04/01/15 0014     Urine Type Clean Catch      Color, UA YELLOW      Clarity, UA CLOUDY (A)      Specific Gravity UA 1.020      Urine pH 7.5      Leukocyte Esterase, UA NEGATIVE      Nitrite, UA NEGATIVE      Protein, UR NEGATIVE      Glucose, UA NEGATIVE  Ketones UA NEGATIVE      Urobilinogen, UA 4.0 (A) mg/dL      Bilirubin, UA NEGATIVE      Blood, UA TRACE-INTACT (A)      RBC, UA 0 - 5 /hpf      WBC, UA 0 - 5 /hpf      Squamous Epithelial Cells, Urine 11 - 25 /hpf      Urine Amorphous Many (A) /hpf      Urine Mucus Present     Narrative:      Reason for not entering the date/time of last dose:->Unknown  Last dose    Rapid influenza A/B antigens [[id number]] Collected:  03/31/15 2313    Specimen Information:  Nasopharyngeal / Nasal Aspirate Updated:  03/31/15 2341    Narrative:      ORDER#: [id number]                                    ORDERED BY: Marlane Hatcher  SOURCE: Nasal Aspirate                               COLLECTED:  03/31/15 23:13  ANTIBIOTICS AT COLL.:                                RECEIVED :  03/31/15 23:26  Influenza Rapid Antigen A&B                FINAL       03/31/15 23:41  03/31/15   Negative for Influenza A and B             Reference Range: Negative      Digoxin Level [[id number]]  (Abnormal) Collected:  03/31/15 2143    Specimen Information:  Blood Updated:   03/31/15 2229     Digoxin Level 0.38 (L) ng/mL      Digoxin Date of Last Dose /Unknown      Digoxin Time of Last Dose UNKNOWN     Narrative:      Reason for not entering the date/time of last dose:->Unknown  Last dose    Comprehensive metabolic panel [[id number]]  (Abnormal) Collected:  03/31/15 2143    Specimen Information:  Blood Updated:  03/31/15 2225     Glucose 112 (H) mg/dL      BUN 62.9 mg/dL      Creatinine 0.8 mg/dL      Sodium 528 mEq/L      Potassium 3.7 mEq/L      Chloride 104 mEq/L      CO2 24 mEq/L      CALCIUM 9.0 mg/dL      Protein, Total 6.8 g/dL      Albumin 3.1 (L) g/dL      AST (SGOT) 24 U/L      ALT 12 U/L      Alkaline Phosphatase 104 U/L      Bilirubin, Total 0.9 mg/dL      Globulin 3.7 (H) g/dL      Albumin/Globulin Ratio 0.8 (L)      Anion Gap 10.0     Narrative:      Reason for not entering the date/time of last dose:->Unknown  Last dose    GFR [[id number]] Collected:  03/31/15 2143     EGFR >60.0 Updated:  03/31/15 2225  Narrative:      Reason for not entering the date/time of last dose:->Unknown  Last dose    PT/APTT [[id number]]  (Abnormal) Collected:  03/31/15 2143     PT 38.0 (HH) sec Updated:  03/31/15 2214     PT INR 4.0      PT Anticoag. Given Within 48 hrs. --      Result:        None  rivaroxaba       PTT 56 (H) sec     Narrative:      Reason for not entering the date/time of last dose:->Unknown  Last dose    B-type Natriuretic Peptide [[id number]]  (Abnormal) Collected:  03/31/15 2143    Specimen Information:  Blood Updated:  03/31/15 2210     B-Natriuretic Peptide 165.8 (H) pg/mL     Narrative:      Reason for not entering the date/time of last dose:->Unknown  Last dose    Lactic acid [[id number]] Collected:  03/31/15 2143    Specimen Information:  Blood Updated:  03/31/15 2203     Lactic acid 1.5 mmol/L     CBC with differential [[id number]]  (Abnormal) Collected:  03/31/15 2143    Specimen Information:  Blood / Blood Updated:  03/31/15 2150     WBC 8.23 x10 3/uL      Hgb 11.4 (L)  g/dL      Hematocrit 55.7 (L) %      Platelets 205 x10 3/uL      RBC 3.63 (L) x10 6/uL      MCV 97.5 fL      MCH 31.4 pg      MCHC 32.2 g/dL      RDW 15 %      MPV 10.7 fL      Neutrophils 74 %      Lymphocytes Automated 12 %      Monocytes 13 %      Eosinophils Automated 1 %      Basophils Automated 0 %      Immature Granulocyte 0 %      Neutrophils Absolute 6.08 x10 3/uL      Abs Lymph Automated 1.03 x10 3/uL      Abs Mono Automated 1.05 x10 3/uL      Abs Eos Automated 0.06 x10 3/uL      Absolute Baso Automated 0.01 x10 3/uL      Absolute Immature Granulocyte 0.03 x10 3/uL     Narrative:      Reason for not entering the date/time of last dose:->Unknown  Last dose         Radiology reports have been reviewed:  Radiology Results (24 Hour)     Procedure Component Value Units Date/Time    CT Boney Pelvis [[id number]] Collected:  04/01/15 0127    Order Status:  Completed Updated:  04/01/15 0133    Narrative:      TECHNIQUE: CT pelvis WITHOUT contrast. Sagittal and coronal  reconstructed images were obtained.    INDICATION: Bilateral hip pain.    COMPARISON: Hip radiographs dated 03/31/2015.     FINDINGS:     No acute fracture or dislocation.    Small osteophytes are seen at both hips with no significant joint space  narrowing consistent with minimal degenerative changes. Mild  degenerative changes of the bilateral sacroiliac joints. Mild  degenerative changes of the lumbar spine.      Impression:        No acute abnormality.  Johnsie Kindred, MD   04/01/2015 1:29 AM      Hip Left AP and Lateral [[id number]] Collected:  03/31/15 2257    Order Status:  Completed Updated:  03/31/15 2302    Narrative:      History: Hip pain.    FINDINGS: AP and frog-leg views were obtained of the left hip and  pelvis.    There is minimal degenerative spur formation along the posterior  acetabula bilaterally. There is no evidence of a fracture or a focal  bone lesion.      Impression:        1. Minimal degenerative changes in both hips.  2. No  fracture is seen.    Lanier Ensign, MD   03/31/2015 10:58 PM      Hip Right AP and Lateral [[id number]] Collected:  03/31/15 2255    Order Status:  Completed Updated:  03/31/15 2301    Narrative:      History: Pain.    FINDINGS: AP and frog-leg views were obtained of the right hip and  pelvis.    There is no evidence of a fracture or a focal bone lesion. There is mild  spur formation along the posterior acetabula bilaterally. The sacral  iliac joints show minimal degenerative change.      Impression:        Minimal degenerative changes in the hips and sacroiliac joints.    Lanier Ensign, MD   03/31/2015 10:57 PM      XR Chest  AP Portable [[id number]] Collected:  03/31/15 2225    Order Status:  Completed Updated:  03/31/15 2230    Narrative:      History: Cough and shortness of breath.    FINDINGS: An AP portable upright view was obtained.    The lungs are clear with no evidence of infiltrates, edema, or fibrosis.  There is no evidence of a pleural effusion or pneumothorax. Cardiomegaly  is noted. A pacemaker is in place. There are degenerative changes in the  thoracic spine.      Impression:        No active disease in the chest.    Lanier Ensign, MD   03/31/2015 10:26 PM      [place] VenoDopp Low Extremity Bilateral [[id number]] Collected:  03/31/15 2225    Order Status:  Completed Updated:  03/31/15 2229    Narrative:      History: Bilateral leg pain.    FINDINGS: Color flow and spectral doppler evaluation of the deep venous  system of both lower extremities was performed. On each side the common  femoral vein, superficial femoral vein, and popliteal vein were  visualized and appear unremarkable.  These veins are compressible with  no evidence of thrombus.  Doppler demonstrates patency and normal  directional and phasic flow.  Appropriate augmentation was observed.   The proximal deep femoral vein is visualized and appears unremarkable.   The portions of the posterior tibial and peroneal veins that were seen  appear  unremarkable with no thrombus noted.  There is no indirect  evidence of a calf vein thrombus.      Impression:        No evidence of deep vein thrombosis involving either lower extremity.    Lanier Ensign, MD   03/31/2015 10:25 PM           EKG: EKG reviewed          Hospitalist   Signed by: Jamilex Bohnsack  04/01/2015 2:08 AM

## 2015-04-01 NOTE — Plan of Care (Signed)
Patient' Blood Culture is positive for Gram Positive cocci resembling Streptococcus species.  Will Start IV Ceftriaxone and will repeat Blood Cultures after 2 doses of Iv antibiotic.

## 2015-04-01 NOTE — Plan of Care (Signed)
Problem: Safety  Goal: Patient will be free from injury during hospitalization  Outcome: Progressing    Intervention: Assess patient's risk for falls and implement fall prevention plan of care per policy  Intervention: Provide and maintain safe environment  Intervention: Use appropriate transfer methods  Intervention: Ensure appropriate safety devices are available at the bedside  Intervention: Include patient/family/caregiver in decisions related to safety  Intervention: Hourly rounding.

## 2015-04-02 ENCOUNTER — Inpatient Hospital Stay: Payer: Medicare (Managed Care)

## 2015-04-02 ENCOUNTER — Other Ambulatory Visit: Payer: Medicare Other

## 2015-04-02 DIAGNOSIS — R7881 Bacteremia: Secondary | ICD-10-CM | POA: Diagnosis present

## 2015-04-02 DIAGNOSIS — I4891 Unspecified atrial fibrillation: Secondary | ICD-10-CM

## 2015-04-02 DIAGNOSIS — E785 Hyperlipidemia, unspecified: Secondary | ICD-10-CM

## 2015-04-02 LAB — BASIC METABOLIC PANEL
Anion Gap: 8 (ref 5.0–15.0)
BUN: 16 mg/dL (ref 7.0–19.0)
CO2: 26 mEq/L (ref 22–29)
Calcium: 9.2 mg/dL (ref 7.9–10.2)
Chloride: 107 mEq/L (ref 100–111)
Creatinine: 0.8 mg/dL (ref 0.6–1.0)
Glucose: 140 mg/dL — ABNORMAL HIGH (ref 70–100)
Potassium: 4.6 mEq/L (ref 3.5–5.1)
Sodium: 141 mEq/L (ref 136–145)

## 2015-04-02 LAB — CBC AND DIFFERENTIAL
Hematocrit: 36.7 % — ABNORMAL LOW (ref 37.0–47.0)
Hgb: 11.6 g/dL — ABNORMAL LOW (ref 12.0–16.0)
MCH: 30.9 pg (ref 28.0–32.0)
MCHC: 31.6 g/dL — ABNORMAL LOW (ref 32.0–36.0)
MCV: 97.9 fL (ref 80.0–100.0)
MPV: 10.1 fL (ref 9.4–12.3)
Platelets: 205 10*3/uL (ref 140–400)
RBC: 3.75 10*6/uL — ABNORMAL LOW (ref 4.20–5.40)
RDW: 15 % (ref 12–15)
WBC: 10.89 10*3/uL — ABNORMAL HIGH (ref 3.50–10.80)

## 2015-04-02 LAB — ECG 12-LEAD
Atrial Rate: 81 {beats}/min
Q-T Interval: 350 ms
QRS Duration: 110 ms
QTC Calculation (Bezet): 418 ms
R Axis: -27 degrees
T Axis: -28 degrees
Ventricular Rate: 86 {beats}/min

## 2015-04-02 LAB — MAN DIFF ONLY
Band Neutrophils Absolute: 1.09 10*3/uL — ABNORMAL HIGH (ref 0.00–1.00)
Band Neutrophils: 10 %
Basophils Absolute Manual: 0 10*3/uL (ref 0.00–0.20)
Basophils Manual: 0 %
Eosinophils Absolute Manual: 0 10*3/uL (ref 0.00–0.70)
Eosinophils Manual: 0 %
Lymphocytes Absolute Manual: 0.98 10*3/uL (ref 0.50–4.40)
Lymphocytes Manual: 9 %
Monocytes Absolute: 0.22 10*3/uL (ref 0.00–1.20)
Monocytes Manual: 2 %
Neutrophils Absolute Manual: 8.6 10*3/uL — ABNORMAL HIGH (ref 1.80–8.10)
Nucleated RBC: 0 /100 WBC (ref 0–1)
Segmented Neutrophils: 79 %

## 2015-04-02 LAB — GFR: EGFR: >60

## 2015-04-02 LAB — CELL MORPHOLOGY
Cell Morphology: NORMAL
Platelet Estimate: NORMAL

## 2015-04-02 MED ORDER — IODIXANOL 320 MG/ML IV SOLN
100.0000 mL | Freq: Once | INTRAVENOUS | Status: AC | PRN
Start: 2015-04-02 — End: 2015-04-02
  Administered 2015-04-02: 100 mL via INTRAVENOUS

## 2015-04-02 MED ORDER — VANCOMYCIN HCL 1000 MG IV SOLR
2000.0000 mg | Freq: Once | INTRAVENOUS | Status: AC
Start: 2015-04-02 — End: 2015-04-02
  Administered 2015-04-02: 2000 mg via INTRAVENOUS
  Filled 2015-04-02: qty 2000

## 2015-04-02 MED ORDER — VANCOMYCIN HCL 1000 MG IV SOLR
1500.0000 mg | INTRAVENOUS | Status: DC
Start: 2015-04-03 — End: 2015-04-04
  Administered 2015-04-03 – 2015-04-04 (×2): 1500 mg via INTRAVENOUS
  Filled 2015-04-02 (×2): qty 1500

## 2015-04-02 NOTE — Progress Notes (Signed)
Hospitalist Medicine Progress Note    Date Time: 04/02/2015  7:54 PM  Patient Name:Bethany Howell    CC / HPI    Patient is a 79 year old lady with a past medical history of atrial fibrillation, cardiomyopathy, hypertension, hyperlipidemia, admitted for the evaluation of proximal thigh weakness bilaterally, left more than right.  She also underwent recent change of battery for the pacemaker.  She is currently positive for blood cultures.  She is on broad-spectrum IV antibiotics.  Awaiting culture sensitivities.  We will check for spine, lumbar with contrast.  Discussed with Dr. Delbert Phenix from infectious disease.    Subjective    Patient seen and examined.    Left proximal thigh weakness.     ASSESSMENT     1. Bacteremia : Unknown etiology,  Will check CT lumbar with contrast, continue pain control as needed for now,  . Check 2 D Echo.    2. Atrial fibrillation: Currently in atrial fibrillation, stable, continue digoxin, Toprol, Xarelto, monitor on telemetry  3. Cardiomyopathy: Continue Lasix, Cozaar, Toprol, strict I's and O's, monitor on telemetry  4. Hypertension: Continue Cozaar, Toprol  5. Hyperlipidemia: Continue Lipitor      PLAN     Check CT lumbar with contrast    Infectious diseases consult.  Discussed with Dr. Delbert Phenix.  Broad spectrum IV antibiotics.  Continue digoxin, Toprol, Xarelto  Monitor in telemetry.  Ortho evaluation after CT spine   DVT px on xarelto   Check 2 D echo   Continue physical therapy   Added ANA panel       Physical Exam:   BP 171/88 mmHg  Pulse 67  Temp(Src) 99 F (37.2 C) (Temporal Artery)  Resp 17  Ht 1.651 m (5\' 5" )  Wt 95.029 kg (209 lb 8 oz)  BMI 34.86 kg/m2  SpO2 97%    No intake or output data in the 24 hours ending 04/02/15 1954    Constitutional: Patient appears well-developed and well-nourished.  mild distress ++  Head: Normocephalic and atraumatic.  Eyes- pupils equal and reactive, extraocular eye movements intact, sclera anicteric  Ears - external ear canals  normal, right ear normal, left ear normal  Nose - normal and patent, no erythema, discharge or polyps and normal nontender sinuses  Mouth - mucous membranes moist, pharynx normal without lesions  Neck: Normal range of motion. Neck supple. No JVD present. No tracheal deviation present. No thyromegaly present.   Cardiovascular: Normal rate, regular rhythm, normal heart sounds and intact distal pulses.  Exam reveals no gallop and no friction rub. No murmur heard.  Pulmonary/Chest: Effort normal and breath sounds normal. No stridor. No respiratory distress. Patient has no wheezes. No rales were present.  Exhibits no tenderness.   Abdominal: Soft. Bowel sounds are normal. Patient exhibits no distension and no mass was palpable. There is no tenderness. There is no rebound and no guarding. GYN exam left labial rash+.  Musculoskeletal: Proximal muscle weakness++ left thigh++  Lymphadenopathy:  Patient has no cervical adenopathy.   Neurological: Patient is alert and oriented to person, place, and time and has normal reflexes. No cranial nerve deficit.  Normal muscle tone. Coordination normal.   Skin: Skin is warm. No rash noted. Patient is not diaphoretic. No erythema. No pallor.   Psychiatric: Has normal mood and affect. Behavior is normal. Judgment and thought content normal.       labs     Recent Labs      04/02/15   0549  04/01/15  4742  03/31/15   2143   WBC  10.89*  6.90  8.23   HGB  11.6*  10.6*  11.4*   HEMATOCRIT  36.7*  33.5*  35.4*   PLATELETS  205  161  205   PT   --   33.3*  38.0*   PT INR   --   3.4  4.0   PTT   --   46*  56*   ALBUMIN   --   2.8*  3.1*   CALCIUM  9.2  8.4  9.0      Recent Labs      04/02/15   0549  04/01/15   0427  03/31/15   2143   SODIUM  141  141  138   POTASSIUM  4.6  3.8  3.7   CHLORIDE  107  107  104   CO2  26  24  24    BUN  16.0  10.4  12.8   CREATININE  0.8  0.7  0.8   GLUCOSE  140*  90  112*   ALT   --   12  12   AST (SGOT)   --   19  24   ALKALINE PHOSPHATASE   --   90  104    BILIRUBIN, TOTAL   --   1.1  0.9                  Radiology      Radiology Results (24 Hour)     ** No results found for the last 24 hours. **             Medications     Current Facility-Administered Medications   Medication Dose Route Frequency   . atorvastatin  10 mg Oral Daily   . cefTRIAXone  2 g Intravenous Q24H SCH   . digoxin  125 mcg Oral Daily   . docusate sodium  100 mg Oral Q12H SCH   . furosemide  20 mg Oral BID   . losartan  25 mg Oral Daily   . methylPREDNISolone  40 mg Intravenous Q12H SCH   . metoprolol XL  50 mg Oral BID   . rivaroxaban  20 mg Oral Daily with dinner   . Senna  8.6 mg Oral QHS   . [START ON 04/03/2015] vancomycin  1,500 mg Intravenous Q18H   . vancomycin  2,000 mg Intravenous Once        acetaminophen, albuterol, cyclobenzaprine, HYDROcodone-acetaminophen, morphine          DVT Prophylaxis       Xarelto     Discharge Disposition / Case management / Physical therapy     Home     Type of Admission:    inpatient     Reason for extended stay:      Bacteremia      Jackquline Bosch MD FACP  [facility] Hospitalist   Pager Number: [phone]

## 2015-04-02 NOTE — Plan of Care (Signed)
Problem: Moderate/High Fall Risk Score >5  Goal: Patient will remain free of falls  Outcome: Progressing  Patient resting quietly in bed.  Bed alarm on, call bell and bedside table within reach.  Patient verbalized understanding of call bell system for needs.  No needs voiced at this time.  Will continue to monitor.

## 2015-04-02 NOTE — Plan of Care (Signed)
Problem: Safety  Goal: Patient will be free from injury during hospitalization  Outcome: Progressing  Patient resting quietly in bed.  Bed alarm on, call bell and bedside table within reach.  Patient verbalized understanding of call bell system for needs.  No needs voiced at this time.  Will continue to monitor.  Intervention: Assess patient's risk for falls and implement fall prevention plan of care per policy  .  Intervention: Provide and maintain safe environment  Patient resting quietly in bed.  Bed alarm on, call bell and bedside table within reach.  Patient verbalized understanding of call bell system for needs.  No needs voiced at this time.  Will continue to monitor. Patients room free of clutter, bedside table and call bell within reach. Dan Humphreys is at bedside, patient agrees to call for assistance.  Intervention: Use appropriate transfer methods  Walker at bedside  Intervention: Ensure appropriate safety devices are available at the bedside  Walker at bedside.

## 2015-04-02 NOTE — Progress Notes (Signed)
Pt is requesting a rollator instead of a rolling walker. The home health care liaisons at [phone] were made aware.

## 2015-04-02 NOTE — PT Eval Note (Signed)
[name]  [address]  [place], [place]. [address]    Department of Rehabilitation  [phone]    Physical Therapy Evaluation    Patient: Bethany Howell    MRN#: [id number]     M211/M211-B    Time of treatment: Time Calculation  PT Received On: 04/02/15  Start Time: 1005  Stop Time: 1030  Time Calculation (min): 25 min    PT Visit Number: 1    Consult received for Bethany Howell for PT Evaluation and Treatment.  Patient's medical condition is appropriate for Physical therapy intervention at this time.      Assessment:   Bethany Howell is a 79 y.o. female admitted 03/31/2015 presenting with pelvic pain.     Impairments: Assessment: Appears to be at baseline for mobility.     Therapy Diagnosis: None.    Rehabilitation Potential: Prognosis: Good      Plan:    Treatment/Interventions: No skilled interventions needed at this time PT Frequency: one time visit    Risks/Benefits/POC Discussed with Pt/Family: With patient     Progress: Discontinue PT    Goals: n/a         Discharge Recommendations:   Based on today's session patient's discharge recommendation is the following: Home with supervision;Home with home health PT   DME Recommended for Discharge: Front wheel walker      Precautions and Contraindications: Fall risk        Medical Diagnosis: Peripheral venous insufficiency [I87.2]  History of atrial fibrillation [Z86.79]  Anticoagulated [Z79.01]  Pelvic pain [R10.2]  Labial abrasion, initial encounter [S30.814A]  Fever, unspecified fever cause [R50.9]    History of Present Illness: Bethany Howell is a 79 y.o. female admitted on 03/31/2015 with "pelvic pain since Monday.  Pelvic pain was sudden onset, no specific trigger, felt as burning/cramping, radiating to bilateral legs, accompanied with tingling/numbness on bilateral legs, worsened with standing or walking.  Patient denies any loss of bowel or bladder control. Patient normally ambulates without any walker or cane.  Patient denies any specific trauma or  injury or recent surgery" per H&P.    Patient Active Problem List   Diagnosis   . Pelvic pain   . HTN (hypertension)   . Cardiomyopathy   . Atrial fibrillation   . HLD (hyperlipidemia)   . Bacteremia        Past Medical/Surgical History:  Past Medical History   Diagnosis Date   . Hypertension    . Cardiomyopathy    . Atrial fibrillation      with pacer    . Arthritis    . Hypercholesterolemia    . Basal cell carcinoma       Past Surgical History   Procedure Laterality Date   . Hysterectomy     . Replacement total knee       Social History:  Prior Level of Function  Prior level of function: Independent with ADLs, Ambulates independently  Baseline Activity Level: Community ambulation, Household ambulation  Driving: independent  Cooking: Yes  Home Living Arrangements  Living Arrangements: Alone (Pt currently staying with daughter until May 11)  Type of Home:  (Pt has condo, staying at [employer] currently)  Home Layout: Performs ADL's on one level (Basement apartment at daughter's)  Bathroom Shower/Tub: Walk-in shower (at [employer])  [employer]: Grab bars in shower      Subjective:    Patient is agreeable to participation in the therapy session. Nursing clears patient for therapy. No c/o pain currently.  Patient Goal  Patient Goal: To return home       Objective:   Observation of Patient/Vital Signs:  Patient is seated at edge of bed with telemetry and peripheral IV in place. RN disconnected IV during PT session.    Cognition/Neuro Status  Arousal/Alertness: Appropriate responses to stimuli  Attention Span: Appears intact  Orientation Level: Oriented X4  Memory: Appears intact  Following Commands: Follows one step commands without difficulty  Safety Awareness: minimal verbal instruction  Insights: Fully aware of deficits  Behavior: attentive;calm;cooperative    Sensation: intact light touch BLE    Gross ROM  Right Lower Extremity ROM: within functional limits  Left Lower Extremity ROM: within  functional limits  Gross Strength  Right Lower Extremity Strength: 4/5  Left Lower Extremity Strength: 4/5       Functional Mobility  Supine to Sit: Modified Independent;HOB raised  Sit to Stand: Supervision (x1 from EOB, x1 from toilet seat)  Stand to Sit: Supervision     Locomotion  Ambulation: Supervision;with front-wheeled walker  Ambulation Distance (Feet): 150 Feet  Pattern: decreased cadence (Instruction for safety with RW)     Balance  Balance: within functional limits (with RW)    Participation and Endurance  Participation Effort: good  Endurance: Tolerates 10 - 20 min exercise with multiple rests. No c/o dizziness or SOB during mobility.    Treatment Activities: Ambulated to/from bathroom in room with supervision and RW. No c/o dizziness or SOB. Pt able to perform toilet transfers, hygiene and wash hands at sinkside with supervision. Instructed in safe use of RW in bathroom to decrease risk of falls. Ambulated around unit as above without LOB. Discussed use of RW for all mobility due to need for BUE support. Instructed in safe use of RW at home, not carrying on stairs and taking up throw rugs. Instructed not to perform stairs or shower transfers without supervision to decrease risk of falls. Educated on obtaining seat for shower for same. Spoke with daughter over telephone regarding outcome of eval and recommendations as well.    Educated in and performed AP, LAQ and seated marching and encouraged to perform LE therex throughout the day to decrease effects of immobility. Encouraged to ambulate with supervision while in hospital for same. Pt verbalized understanding for all education provided.    Educated the patient to role of physical therapy, plan of care, goals of therapy and HEP, safety with mobility and ADLs.    At end of session pt in bathroom, RN aware. Instructed to pull cord when finished.      Delfin Edis, PT, DPT  Pager #: [phone]

## 2015-04-02 NOTE — OT Eval Note (Signed)
[facility]  [address]  [place], [place]. [address]    Department of Rehabilitation Services  [phone]    Occupational Therapy Evaluation    Patient: Bethany Howell    MRN#: [id number]     M211/M211-B    Time of treatment: Time Calculation  OT Received On: 04/02/15  Start Time: 1036  Stop Time: 1101  Time Calculation (min): 25 min       Consult received for Bethany Howell for OT Evaluation and Treatment.  Patient's medical condition is appropriate for Occupational therapy intervention at this time.    Assessment:   Gurtha Picker is a 79 y.o. female admitted 03/31/2015 presenting with pelvic pain.    Impairments: Assessment: Appears to be at baseline for ADL's    Therapy Diagnosis: None    Rehabilitation Potential: Prognosis: Good;With family      Plan:   OT Frequency Recommended: one time visit   Treatment Interventions: No skilled interventions needed at this time     Patient Goal  Patient Goal:  (to go home)    Risks/Benefits/POC Discussed with Pt/Family: With patient    Goals: N/A due to no skilled OT intervention warranted at this time.                                       Discharge Recommendations:   Based on today's session patient's discharge recommendation is the following: Discharge Recommendation: Home with supervision.    DME Recommended for Discharge: Shower chair; rw        Precautions and Contraindications: Falls Risk          Medical Diagnosis: Peripheral venous insufficiency [I87.2]  History of atrial fibrillation [Z86.79]  Anticoagulated [Z79.01]  Pelvic pain [R10.2]  Labial abrasion, initial encounter [S30.814A]  Fever, unspecified fever cause [R50.9]    History of Present Illness: Bethany Howell is a 79 y.o. female admitted on 03/31/2015 with "the chief complaint of pelvic pain since Monday.  Pelvic pain was sudden onset, no specific trigger, felt as burning/cramping, radiating to bilateral legs, accompanied with tingling/numbness on bilateral legs, worsened with standing or walking.   Patient denies any loss of bowel or bladder control. Patient normally ambulates without any walker or cane.  Patient denies any specific trauma or injury or recent surgery.  Patient does have chronic coughing with some phlegm since last year, unchanged.  Patient was also noticed to have low-grade fever as well of 99.6. Patient denies any Patient denies any headache, lightheadedness, fever, chills, chest pain, palpitation, shortness of breath, coughing, nausea, vomiting, abdominal pain, diarrhea, blood in stool, urinary symptoms."--as per H & P note.       Patient Active Problem List   Diagnosis   . Pelvic pain   . HTN (hypertension)   . Cardiomyopathy   . Atrial fibrillation   . HLD (hyperlipidemia)   . Bacteremia        Past Medical/Surgical History:  Past Medical History   Diagnosis Date   . Hypertension    . Cardiomyopathy    . Atrial fibrillation      with pacer    . Arthritis    . Hypercholesterolemia    . Basal cell carcinoma       Past Surgical History   Procedure Laterality Date   . Hysterectomy     . Replacement total knee           Social  History:  Prior Level of Function  Prior level of function: Independent with ADLs, Ambulates independently  Baseline Activity Level: Community ambulation, Household ambulation  Driving: independent  Cooking: Yes  Home Living Arrangements  Living Arrangements: Alone (Pt currently staying with daughter until May 11)  Type of Home:  (Pt has condo, staying at [employer] currently)  Home Layout: Performs ADL's on one level (Basement apartment at daughter's)  Bathroom Shower/Tub: Walk-in shower (at [employer])  [employer]: Grab bars in shower      Subjective:   Patient is agreeable to participation in the therapy session. Nursing clears patient for therapy.  Subjective:  (Patient denies pain currently)  Pain Assessment  Pain Assessment: No/denies pain.        Objective:   Observation of Patient/Vital Signs:  Patient is standing at sink side with telemetry and  peripheral IV in place.         Cognition/Neuro Status  Arousal/Alertness: Appropriate responses to stimuli  Attention Span: Attends to task with redirection (Patient talkative requiring re-direction to task)  Orientation Level: Oriented X4  Memory: Appears intact  Following Commands: independent  Safety Awareness: minimal verbal instruction  Behavior: attentive;calm;cooperative  Hand Dominance: right handed    Gross ROM  Right Upper Extremity ROM: within functional limits  Left Upper Extremity ROM: within functional limits  Gross Strength via clinical observation  Right Upper Extremity Strength: within functional limits  Left Upper Extremity Strength: within functional limits          Sensory  Auditory: intact  Tactile - Light Touch: intact (per Patient report)  Visual Acuity: wears glasses       Self-care and Home Management  Grooming: Supervision;standing with assistive device;wash/dry hands;supervision/safety  Toileting: Supervision;clothing management up;clothing management down;perineal hygiene;supervison/safety  Functional Transfers: Supervision;toilet transfer;supervision/safety    Mobility and Transfers  Sit to Stand: Supervision (from toilet/arm-chair)  Toilet to Arm-chair Transfer: Supervision (w/ rw)  -verbal/tactile instructions for proper hand placement and pacing with all functional transfers for increased safety.      Balance  Static Sitting Balance: good  Static Standing Balance: good (w/ rw)  Dynamic Standing Balance: good (w/ rw)    Participation and Endurance  Participation Effort: good  Endurance: Tolerates 10 - 20 min exercise with multiple rests; Patient denied feeling lightheaded or dizzy during the session.      Treatment Activities: Patient stood at sinkside for approx. 5-7 minutes with rw and Supervision for balance. Discussed home bathroom safety with recommendations made to use shower seat, grab bars in shower and around toilet, raised toilet seat and for Patient to have Supervision with  shower transfers upon d/c to home to ensure safety/fall prevention. Pt. Instructed safety precautions with rw use, stressing importance of keeping hands free to grasp rw at all times. Pt. Educated in rw accessories (ie: bag, basket, rw tray) to increase safety with item transport. Advised Pt. To remove throw/scatter rugs for fall prevention.   Pt. Instructed to perform B/L UE AROM exercises for shoulder, elbow and digit F/E intermittently throughout the day to increase endurance and strength for ADL's with visual demonstration provided to increase clarity.Patient seated in arm-chair at end of session with all needs within reach. Patient instructed to ring for nursing for all needs and appeared receptive to same.    Educated the patient to role of occupational therapy, plan of care, goals of therapy and safety with mobility and ADLs, home safety.      AM-PACT "6 Clicks" Daily Activity Inpatient  Short Form  Inpatient AM-PACT Performed?: yes  Put On/Take Off Lower Body Clothing: A little  Assist with Bathing: A little  Assist with Toileting: A little  Put On/Take Off Upper Body Clothing: None  Assist with Grooming: A little  Assist with Eating: None  OT Daily Activity Raw Score: 20  CMS 0-100% Score: 38.32%    Based on [facility] AM-PACT --Daily Activity Inpatient (Short Form) score functional assessment, and clinical judgement.    Patient's current impairment level is:  Self Care, Current Status [phone]): CJ     Patient is expected to achieve:  Self Care, Goal Status (U0454): CJ    Patient's D/C status is:   Self Care, D/C Status (U9811): CJ       ATTENTION MDs:  Thank you for allowing [place] to participate in the care of Skilar Dunsworth. Regulations from the Center for Medicare and Medicaid Services (CMS) require your review and approval of this plan of care.    Please cosign this note indicating you are in agreement with the OT plan of care so we may initiate the therapy treatment plan.    Tennis Ship. Trixie Deis,  MS,OTR/L  Pager # [phone]  [phone]

## 2015-04-02 NOTE — Progress Notes (Signed)
Discharge Planning:    D/C Disposition: Home with HHC and community follow up through [facility]  Expected D/C Date: 1-2 days     Rounded with Rabelle RN.     Case Management Initial Discharge Planning Assessment     Psychosocial/Demographic Information   Name of interviewee/s:  Patient   Orientation and decision making abilities of patient (ie a&ox3 able to make decisions, demented patient, patient on vent, etc)   AAox4 and abel to make decisions.    Does the patient have an Advance Directive? Location? (home/on chart, if home-advised to bring in copy?) <no information>  Advance Directive: Patient does not have advance directive]   Healthcare Decision Maker (HDM) (if other than the patient) Include relationship and contact information.  Self.    Any additional emergency contacts? Extended Emergency Contact Information  Primary Emergency Contact: Barbour,Lorie   [employer]: [phone]  Relation: None   Pt lives with:  Living Arrangements: Alone (Pt currently staying with daughter until May 11)]   Type of residence where patient lives:   Type of Home:  (Pt has condo, staying at daughter's house currently)]  Home Layout: Performs ADL's on one level (Basement apartment at daughter's)]  Bathroom Shower/Tub: Walk-in shower (at daughter's)]   ]  Bathroom Equipment: Grab bars in shower]   Prior level of functioning (ambulation & ADL's)  Prior level of function: Independent with ADLs, Ambulates independently]   Support system-list  (i.e.church, friends, extended family, friends?)    Strong family support. The patient states that her daughter has been asking the patient to stay longer, but the patient states she enjoys her home in [place] and would like to return.    Do you want to designate an individual who will care for or assist you upon discharge? Daughter Youlanda Tomassetti.    If yes: Please list the name, relationship, phone number, and address of the designated individual. See  facesheet.    Correct Insurance listed on face sheet - verified with the patient/HDM Medicare A and B and another       Discharge Planning Services in Place  Current LACE score? 7   Name of Primary Care Physician verified in patient banner (update in patient banner if not listed) Pcp, Noneorunknown, MD  None Pt states that she was going to Dr. Marlynn Perking but it was a large out of pocket cost so she would like to go somewhere else. CM reccommended the discharge clinic or the [facility] group. CM encouraged the patient to call her insurance company to see if anyone local is in network with her plan. She agreed to go to the [place] discharge clinic because it is the closest to her daughters home. Cm called a left a voicemail for a follow up appointment. Awaiting call back.    PCP Follow up apptmt offered/set up See above.    What DME does the patient currently own? (rolling walker, hospital bed, home O2, BiPAP/CPAP, bedside commode, cane, hoyer lift)   None. Rolling walker was reccommended by PT and the [facility] liaisons were made aware.    Are PT/OT services indicated? If so, has it been ordered?  Ordered and pt seen.    Has the patient been to an Acute Rehab or SNF in the past?  If so, where?   no   Does the patient currently have home health or hospice/palliative services in place?  If so, list agency name.   no   Does the patient  already have community dialysis set up?  If so, where? No.       Readmission Assessment  Is this patient an inpatient to inpatient 30 day readmission?  The patient states that she went to the ED and they told her to follow up outpatient. She states that she did as she was told and went to Dr. Marlynn Perking. However she states that because her insurance is out of network in [place] she realized it would be cheaper to come to the hospital to get in network benefits then to continue an outpatient work up.    Previous admission discharge diagnosis     Was patient readmitted from a facility?        Patient active  with Home Health?        Patient active with Home Hospice?       Contributing factors to readmission (i.e., no follow up appt on previous d/c, unable to get meds, no insurance, no social support, etc.)    Did patient/family understand what medication was for and how to administer, symptoms to indicate worsening condition, activity and diet restrictions at time of previous d/c?                            Anticipated Discharge Plan  Anticipated Disposition: Option A Home with HHC and and a rolling walker. The home health care liaisons at [phone] were made aware to please arrange home health care services for the patient.    Anticipated Disposition: Option B    Who will transport the patient upon discharge? Daughter.    If applicable, were SNF or Hospice choices provided? N/a a this time. Will address if needed.    Palliative Care Consult needed? (if yes, contact attending MD)   ([facility], [facility], [facility], [facility]) Consider Palliative Care Consult?: NO (04/01/15 0400)  Does Not Meet any above Criteria: None of above criteria (04/01/15 0400)  ([facility] ONLY) Consider Palliative Care Consult?: NO (04/01/15 0400)      IInpatient Medicare/Medicare HMO Patients Only  Was an initial IMM signed within 24 hours of admission?  (Look in Media Tab, Documents Table or Shadow Chart)  no        Case management will continue to follow for discharge needs.     Rutherford Nail, RN, BSN  [facility]   Clinical Case Manager

## 2015-04-02 NOTE — Consults (Signed)
Department of Pharmacy Vancomycin Dosing Consult:  Initiation    MRN: [id number]  Room/Bed: M211/M211-B    Bethany Howell is being treated with Vancomycin for bacteremia.  Pharmacy was consulted to dose Vancomycin by Dr.Choudhary.    Dosing was based on the following parameters:  Pt Age:  79 y.o.  Pt Weight:  95 kg  Pt Height:  65 inches  Estimated Creatinine Clearance: 67.1 mL/min (based on Cr of 0.8).  Target trough: 15-20 mcg/mL    The following orders were entered in EPIC:  Vancomycin loading dose of 2000 mg IV.  Vancomycin maintenance dose of 1500 mg IV every 18 hours.  Vancomycin trough prior to the 4th dose.    Pharmacy will monitor drug levels and kidney function and adjust doses as necessary.    Thank you for the consult.  Please contact the pharmacy with any questions at  [phone].    Signed:  Belinda Fisher Miciah Shealy  Date/Time 04/02/2015 5:50 PM

## 2015-04-02 NOTE — Plan of Care (Signed)
Infectious Disease   Full Consult Dictated    04/02/2015   Bethany Howell ZOX:[id number] is a 79 y.o. female, with gram-positive bacteremia, lower extremity weakness      Recommendations:  I would like to suggest the following approach:   Spine surgery evaluation and    Therapy     Rocephin   Vancomycin    LABS:      CBC with Diff   CMP   Repeat Blood cultures     Radiology:    CT lumbosacral spine.       I will follow this patient Closely with you, Thank you Suman for Involving me in care of  Bethany Howell, M.D.,FACP  04/02/2015  4:50 PM

## 2015-04-02 NOTE — Plan of Care (Signed)
Problem: Safety  Goal: Patient will be free from injury during hospitalization  Outcome: Progressing  Fall risk assessment q shift. Call bell, overhead table, and assistive devices within reach. Pt instructed to call for assistance. Bed alarm activated. Bed in lowest position.  Intervention: Assess patient's risk for falls and implement fall prevention plan of care per policy  Fall risk assessment q shift. Call bell, overhead table, and assistive devices within reach. Pt instructed to call for assistance. Bed alarm activated. Bed in lowest position.  Intervention: Provide and maintain safe environment  Fall risk assessment q shift. Call bell, overhead table, and assistive devices within reach. Pt instructed to call for assistance. Bed alarm activated. Bed in lowest position.  Intervention: Use appropriate transfer methods  Assistive devices within reach.  Intervention: Ensure appropriate safety devices are available at the bedside  Fall risk assessment q shift. Call bell, overhead table, and assistive devices within reach. Pt instructed to call for assistance. Bed alarm activated. Bed in lowest position.  Intervention: Include patient/family/caregiver in decisions related to safety  .  Intervention: Hourly rounding.  .  Intervention: Assess for patient's risk for elopement and implement Elopement Risk plan per policy  .      Problem: Pain  Goal: Patient's pain/discomfort is manageable  Outcome: Progressing  Pain assessment q shift and prn.  Intervention: Include patient/family/caregiver in decisions related to pain management  .  Intervention: Offer non-pharmocological pain management interventions  .      Problem: Moderate/High Fall Risk Score >5  Goal: Patient will remain free of falls  Outcome: Progressing  Fall risk assessment q shift. Call bell, overhead table, and assistive devices within reach. Pt instructed to call for assistance. Bed alarm activated. Bed in lowest position.

## 2015-04-03 ENCOUNTER — Ambulatory Visit (INDEPENDENT_AMBULATORY_CARE_PROVIDER_SITE_OTHER): Payer: Self-pay

## 2015-04-03 ENCOUNTER — Inpatient Hospital Stay: Payer: Medicare (Managed Care)

## 2015-04-03 ENCOUNTER — Other Ambulatory Visit (INDEPENDENT_AMBULATORY_CARE_PROVIDER_SITE_OTHER): Payer: Self-pay

## 2015-04-03 DIAGNOSIS — R7881 Bacteremia: Secondary | ICD-10-CM

## 2015-04-03 DIAGNOSIS — I429 Cardiomyopathy, unspecified: Secondary | ICD-10-CM

## 2015-04-03 DIAGNOSIS — I10 Essential (primary) hypertension: Secondary | ICD-10-CM

## 2015-04-03 DIAGNOSIS — E784 Other hyperlipidemia: Secondary | ICD-10-CM

## 2015-04-03 DIAGNOSIS — R102 Pelvic and perineal pain: Secondary | ICD-10-CM

## 2015-04-03 DIAGNOSIS — I482 Chronic atrial fibrillation: Secondary | ICD-10-CM

## 2015-04-03 LAB — CBC AND DIFFERENTIAL
Hematocrit: 33.2 % — ABNORMAL LOW (ref 37.0–47.0)
Hgb: 10.9 g/dL — ABNORMAL LOW (ref 12.0–16.0)
MCH: 32 pg (ref 28.0–32.0)
MCHC: 32.8 g/dL (ref 32.0–36.0)
MCV: 97.4 fL (ref 80.0–100.0)
MPV: 10.2 fL (ref 9.4–12.3)
Platelets: 225 10*3/uL (ref 140–400)
RBC: 3.41 10*6/uL — ABNORMAL LOW (ref 4.20–5.40)
RDW: 15 % (ref 12–15)
WBC: 12.57 10*3/uL — ABNORMAL HIGH (ref 3.50–10.80)

## 2015-04-03 LAB — ANA PANEL
ANA Qualitative: NEGATIVE
ANA RNP: 12 (ref 0–99)
Anti-DNA (DS) Antibody Quantitative: 36 (ref 0–99)
Centromere Interpretation: NEGATIVE
Centromere: 3 (ref 0–99)
Histone Interpretation: NEGATIVE
Histone: 7 (ref 0–99)
Jo-1 Interpretation: NEGATIVE
Jo-1: 8 (ref 0–99)
Ribonucleoprotein (RNP) Antibody Interpretation: NEGATIVE
SM Interpretation: NEGATIVE
SM: 15 (ref 0–99)
SSA Interpretation: NEGATIVE
Scleroderma (Scl-70) Antibody Interpretation: NEGATIVE
Scleroderma SCL-70: 20 (ref 0–99)
Sjogrens SSA (Ro) Antibody: 15 (ref 0–99)
Sjogrens SSB (La) Antibody Interpretation: NEGATIVE
Sjogrens SSB (La) Antibody: 9 (ref 0–99)
dsDNA Interpretation: NEGATIVE

## 2015-04-03 LAB — MAN DIFF ONLY
Band Neutrophils Absolute: 2.51 10*3/uL — ABNORMAL HIGH (ref 0.00–1.00)
Band Neutrophils: 20 %
Basophils Absolute Manual: 0 10*3/uL (ref 0.00–0.20)
Basophils Manual: 0 %
Eosinophils Absolute Manual: 0 10*3/uL (ref 0.00–0.70)
Eosinophils Manual: 0 %
Lymphocytes Absolute Manual: 0.63 10*3/uL (ref 0.50–4.40)
Lymphocytes Manual: 5 %
Monocytes Absolute: 0.63 10*3/uL (ref 0.00–1.20)
Monocytes Manual: 5 %
Neutrophils Absolute Manual: 8.8 10*3/uL — ABNORMAL HIGH (ref 1.80–8.10)
Nucleated RBC: 0 /100 WBC (ref 0–1)
Segmented Neutrophils: 70 %

## 2015-04-03 LAB — COMPREHENSIVE METABOLIC PANEL
ALT: 21 U/L (ref 0–55)
AST (SGOT): 25 U/L (ref 5–34)
Albumin/Globulin Ratio: 0.8 — ABNORMAL LOW (ref 0.9–2.2)
Albumin: 2.7 g/dL — ABNORMAL LOW (ref 3.5–5.0)
Alkaline Phosphatase: 86 U/L (ref 37–106)
Anion Gap: 9 (ref 5.0–15.0)
BUN: 28.2 mg/dL — ABNORMAL HIGH (ref 7.0–19.0)
Bilirubin, Total: 0.4 mg/dL (ref 0.2–1.2)
CO2: 22 mEq/L (ref 22–29)
Calcium: 8.6 mg/dL (ref 7.9–10.2)
Chloride: 109 mEq/L (ref 100–111)
Creatinine: 0.7 mg/dL (ref 0.6–1.0)
Globulin: 3.4 g/dL (ref 2.0–3.6)
Glucose: 121 mg/dL — ABNORMAL HIGH (ref 70–100)
Potassium: 4.1 mEq/L (ref 3.5–5.1)
Protein, Total: 6.1 g/dL (ref 6.0–8.3)
Sodium: 140 mEq/L (ref 136–145)

## 2015-04-03 LAB — CELL MORPHOLOGY
Cell Morphology: NORMAL
Platelet Estimate: NORMAL

## 2015-04-03 LAB — GFR: EGFR: >60

## 2015-04-03 MED ORDER — ALBUTEROL-IPRATROPIUM 2.5-0.5 (3) MG/3ML IN SOLN
3.0000 mL | Freq: Four times a day (QID) | RESPIRATORY_TRACT | Status: DC
Start: 2015-04-03 — End: 2015-04-04
  Administered 2015-04-03 – 2015-04-04 (×6): 3 mL via RESPIRATORY_TRACT
  Filled 2015-04-03 (×5): qty 3

## 2015-04-03 MED ORDER — RIVAROXABAN 20 MG PO TABS
20.0000 mg | ORAL_TABLET | Freq: Every day | ORAL | Status: DC
Start: 2015-04-03 — End: 2015-04-03

## 2015-04-03 MED ORDER — RISAQUAD PO CAPS
1.0000 | ORAL_CAPSULE | Freq: Every day | ORAL | Status: DC
Start: 2015-04-03 — End: 2015-04-07
  Administered 2015-04-03 – 2015-04-07 (×5): 1 via ORAL
  Filled 2015-04-03 (×5): qty 1

## 2015-04-03 MED ORDER — GUAIFENESIN 100 MG/5ML PO SOLN
200.0000 mg | Freq: Four times a day (QID) | ORAL | Status: DC
Start: 2015-04-03 — End: 2015-04-07
  Administered 2015-04-03 – 2015-04-07 (×17): 200 mg via ORAL
  Filled 2015-04-03: qty 10
  Filled 2015-04-03: qty 20
  Filled 2015-04-03 (×7): qty 10
  Filled 2015-04-03: qty 20
  Filled 2015-04-03 (×3): qty 10
  Filled 2015-04-03: qty 20

## 2015-04-03 MED ORDER — FLUTICASONE-SALMETEROL 250-50 MCG/DOSE IN AEPB
1.0000 | INHALATION_SPRAY | Freq: Two times a day (BID) | RESPIRATORY_TRACT | Status: DC
Start: 2015-04-03 — End: 2015-04-07
  Administered 2015-04-03 – 2015-04-07 (×8): 1 via RESPIRATORY_TRACT
  Filled 2015-04-03: qty 14

## 2015-04-03 NOTE — Plan of Care (Signed)
Problem: Safety  Goal: Patient will be free from injury during hospitalization  Outcome: Progressing  Patient alert & oriented x4, resting in bed with eyes closed.  HS meds given per orders.  Bed in low locked position with alarm on and functioning.  Patient instructed to call for assistance for needs through night.  Will continue to monitor through shift.   Intervention: Assess patient's risk for falls and implement fall prevention plan of care per policy  .  Intervention: Provide and maintain safe environment  .  Intervention: Use appropriate transfer methods  .  Intervention: Ensure appropriate safety devices are available at the bedside  Walker at bedside.          Problem: Moderate/High Fall Risk Score >5  Goal: Patient will remain free of falls  Outcome: Progressing  Fall risk assessment every shift.  Call bell is within reach, bedside table, assistive devices are within reach.  Bed is low and bed alarm activated. Patient has been instructed to call for assistance.

## 2015-04-03 NOTE — Consults (Signed)
Service Date: 04/02/2015     Patient Type: I     CONSULTING PHYSICIAN: Alfonzo Beers MD     REFERRING PHYSICIAN: Suman Manchireddy MD     CONSULTING SERVICE:  Infectious Disease     REASON FOR CONSULTATION:  Gram positive bacteremia, lower extremity weakness.     HISTORY OF PRESENT ILLNESS:  Miss Sharne Linders is a 79 year old female with past medical history  significant for hypertension, cardiomyopathy, atrial fibrillation,  hyperlipidemia, history of basal cell carcinoma, who was admitted through  the emergency room on April 01, 2015 with generalized weakness, malaise,  especially the lower extremity weakness, pelvic pain of sudden onset,  denies any bowel or bladder dysfunction, denies any numbness, tingling in  upper extremities, she does have a chronic cough.  She was having low grade  fevers.  Her pacemaker battery was recently replaced today and both her  blood cultures came back positive for gram positive cocci.  She is still  complaining of generalized weakness, malaise though is feeling better now.         REVIEW OF SYSTEMS:  Denies any chest pain, hematemesis, hemoptysis, melena, night sweats,  admits to having fever, cough, joint aches, and pains, muscle aches, pains,  admits to have numbness, tingling in lower extremities, denies any dysuria  or hematuria, denies any constipation, denies any diarrhea.  Other review  of systems is noncontributory.     ALLERGIES:  CODEINE.     PAST MEDICAL HISTORY:  Hypertension, cardiomyopathy, atrial fibrillation, arthritis,  hyperlipidemia and basal cell carcinoma.     PAST SURGICAL HISTORY:  Hysterectomy, history of knee replacement.     FAMILY HISTORY:  Reviewed and noncontributory.     SOCIAL HISTORY:  No smoking or drinking.     MEDICATIONS:  DuoNeb, Lipitor, Rocephin, digoxin, Colace, Advair Diskus, Lasix,  Robitussin, Risaquad, Cozaar, Solu-Medrol, metoprolol, Xarelto and Senokot.        PHYSICAL EXAMINATION:  GENERAL:  Mr. Yarethzy Croak is a 79 year old  female in no apparent  respiratory distress.  VITAL SIGNS:  Temperature 99, pulse 67, respiratory rare of 16, blood  pressure 171/88.  HEENT:  Pallor positive, anicteric, moist mucous membranes.  No thrush.  NECK:  Supple, full range of motion.  RESPIRATORY:  Decreased breath sounds at bases, no wheezes and no rales.  CARDIOVASCULAR:  S1 and S2, irregular.    ABDOMEN:  Soft, bowel sounds are positive.  There is no visceromegaly.  NEUROLOGIC:  Awake, responsive, moves all extremities.  She has some pelvic  tenderness.  SKIN AND EXTREMITIES:  There are no rashes.  PSYCHIATRIC:  Mood and affect is appropriate.     LABORATORY AND DIAGNOSTIC STUDIES:  WBC count is 10.8, hemoglobin 11.6, hematocrit 36.7, platelet 205,  neutrophils 67, segs 79, bands 10, glucose 140, BUN 16.0, creatinine 0.8.   Chemistry: Sodium 141, potassium 4.6, chloride 107, CO2 26, AST 25, ALT 21,  alkaline phosphatase 86.  Urinalysis:  Negative leukocyte esterase,  negative nitrates, 0 to 5 WBCs.  Blood cultures are positive for gram  positive cocci.  X rays of the lumbar spine revealed multilevel  degenerative changes.  CT bony pelvis did not reveal any acute pathology.   Dopplers did not reveal any deep venous thrombosis.       ASSESSMENT:  Miss Jeanett Antonopoulos is a 79 year old female with:  1.  Gram-positive bacteremia.  2.  Lower extremity weakness, pelvic pain.  3.  Atrial fibrillation.  4.  Cardiomyopathy.  5.  Hypertension.  6.  Hyperlipidemia.  7.  Pacemaker placement.     RECOMMENDATIONS:  I would like to suggest the following approach:  1.  Rocephin 2 grams IV daily.  2.  Vancomycin 15 mg IV q.18 h., we will monitor the trough and adjust the  dose accordingly.  3.  Repeat blood cultures.  4.  We will follow on the echocardiogram.  5.  CT scan of lumbosacral spine with contrast.  6.  Consider cardiology evaluation.  7.  We will adjust her antibiotics based on her clinical course and  cultures.  8.  Discussed with the family.     I will follow  this patient closely with you.     Thank you, Suman, for involving me in the care of Miss  Dalana Pfahler.           D:  04/03/2015 16:14 PM by Dr. Fredderick Phenix A. Janalyn Rouse, MD [phone])  T:  04/03/2015 16:59 PM by       Everlean Cherry: [id number]) (Doc ID: [id number])

## 2015-04-03 NOTE — Progress Notes (Signed)
[facility]  Progress Note    Patient Info:   Date/Time: 04/03/2015 / 12:41 PM   Patient Name:Bethany Howell   IHK:[id number]    PCP: Christa See, MD   Admit Date:03/31/2015   Attending Physician:Saira Kramme, Drucilla Schmidt, MD      Assessment and Plan:   -Bilateral groin pain lower back pain.  We cannot do MRI.  Patient has a pacemaker, bilateral lower extremity ultrasound negative.  No acute fracture.  Continue with physical therapy.  Pain Control.  CT scan lumbar spine with contrast done no acute process.  Chronic degenerative changes.  I will order ESR, C-reactive protein and d-dimer.  -Bacteremia.  She recently had pacemaker battery exchange, possibly primary source.  Continue with IV antibiotics.  Repeat blood culture in the morning.  If still positive pacemaker need to come out.  -History of atrial fibrillation, rate controlled on Xarelto.  -Acute bronchitis.  Continue Advair.  Continue DuoNeb breathing treatment.  Continue IV antibiotics.   -.  Hypertension, controlled.  Continue Cozaar.  Metoprolol.  -Cardiomyopathy.  Continue diabetes beta blocker, Cozaar.  - Follow up on infectiouis disease specialist recommendation    Hospital Problems:  Principal Problem:    Pelvic pain  Active Problems:    HTN (hypertension)    Cardiomyopathy    Atrial fibrillation    HLD (hyperlipidemia)    Bacteremia     DVT Prohylaxis:lovenox    Code Status: Full Code   Disposition:home   PT/OT/ST/RT/CM/Nutrition input:   Type of Admission:Inpatient   Estimated Length of Stay (including stay in the ER receiving treatment): 2 more days   Medical Necessity for stay:as aove     Subjective:   Chief Complaint:  Chief Complaint   Patient presents with   . Pelvic Pain   . Leg Swelling      Update on current symptoms:coughing   Review of Systems:Review of Systems   Constitutional: Positive for malaise/fatigue.   HENT: Negative for hearing loss and tinnitus.    Respiratory: Positive for cough. Negative for hemoptysis, sputum  production, shortness of breath and wheezing.    Cardiovascular: Negative for chest pain, palpitations and orthopnea.   Musculoskeletal: Positive for myalgias, back pain and joint pain. Negative for falls and neck pain.   Skin: Negative for itching and rash.   Neurological: Positive for weakness. Negative for headaches.       Allergies:  Allergies   Allergen Reactions   . Codeine       Medications:Meds have been reviewed   Current Facility-Administered Medications   Medication Dose Route Frequency Last Rate Last Dose   . acetaminophen (TYLENOL) tablet 650 mg  650 mg Oral Q4H PRN       . albuterol (PROVENTIL) nebulizer solution 2.5 mg  2.5 mg Nebulization Q6H PRN       . albuterol-ipratropium (DUO-NEB) 2.5-0.5(3) mg/3 mL nebulizer 3 mL  3 mL Nebulization Q6H SCH       . atorvastatin (LIPITOR) tablet 10 mg  10 mg Oral Daily   10 mg at 04/03/15 0927   . cefTRIAXone (ROCEPHIN) 2 g in sodium chloride 0.9 % 100 mL IVPB mini-bag plus  2 g Intravenous Q24H SCH 200 mL/hr at 04/03/15 0938 2 g at 04/03/15 0938   . cyclobenzaprine (FLEXERIL) tablet 10 mg  10 mg Oral TID PRN   10 mg at 04/01/15 1016   . digoxin (LANOXIN) tablet 125 mcg  125 mcg Oral Daily   125 mcg at 04/03/15 0927   .  docusate sodium (COLACE) capsule 100 mg  100 mg Oral Q12H SCH   100 mg at 04/03/15 0927   . fluticasone-salmeterol (ADVAIR DISKUS) 250-50 MCG/DOSE 1 puff  1 puff Inhalation BID       . furosemide (LASIX) tablet 20 mg  20 mg Oral BID   20 mg at 04/03/15 0927   . guaiFENesin (ROBITUSSIN) oral solution 200 mg  200 mg Oral QID       . HYDROcodone-acetaminophen (NORCO) 5-325 MG per tablet 0.5-1 tablet  0.5-1 tablet Oral Q6H PRN   1 tablet at 04/02/15 2313   . lactobacillus/streptococcus (RISAQUAD) capsule 1 capsule  1 capsule Oral Daily   1 capsule at 04/03/15 0927   . losartan (COZAAR) tablet 25 mg  25 mg Oral Daily   25 mg at 04/03/15 0927   . methylPREDNISolone sodium succinate (Solu-MEDROL) injection 40 mg  40 mg Intravenous Q12H SCH   40 mg at  04/03/15 0927   . metoprolol XL (TOPROL-XL) 24 hr tablet 50 mg  50 mg Oral BID   50 mg at 04/03/15 0927   . morphine injection 2 mg  2 mg Intravenous Q4H PRN       . rivaroxaban (XARELTO) tablet 20 mg  20 mg Oral Daily with dinner   20 mg at 04/02/15 1709   . rivaroxaban (XARELTO) tablet 20 mg  20 mg Oral Daily with dinner       . Senna (SENOKOT) tablet 8.6 mg  8.6 mg Oral QHS   8.6 mg at 04/02/15 2256   . vancomycin (VANCOCIN) 1,500 mg in sodium chloride 0.9 % 500 mL IVPB  1,500 mg Intravenous Q18H 250 mL/hr at 04/03/15 1154 1,500 mg at 04/03/15 1154        Objective:   Vitals: Vitals reviewed height is 1.651 m (5\' 5" ) and weight is 96.6 kg (212 lb 15.4 oz). Her temporal artery temperature is 97 F (36.1 C). Her blood pressure is 167/88 and her pulse is 81. Her respiration is 18 and oxygen saturation is 95%. Body mass index is 35.44 kg/(m^2).  Filed Vitals:    04/02/15 2209 04/03/15 0246 04/03/15 0630 04/03/15 0945   BP: 160/88 155/98 146/90 167/88   Pulse: 68 77 67 81   Temp: 97.9 F (36.6 C) 97.9 F (36.6 C) 97.7 F (36.5 C) 97 F (36.1 C)   TempSrc: Temporal Artery Temporal Artery Temporal Artery Temporal Artery   Resp: 18 18 18 18    Height:       Weight:   96.6 kg (212 lb 15.4 oz)    SpO2: 96% 97% 97% 95%   Intake and Output Summary (Last 24 hours) at Date Time No intake or output data in the 24 hours ending 04/03/15 1241   Physical Exam:  Physical Exam   Constitutional: She is oriented to person, place, and time.   Cardiovascular: Exam reveals gallop. Exam reveals no friction rub.    Murmur heard.  Pulmonary/Chest: No respiratory distress. She has wheezes.   Abdominal: She exhibits no distension. There is no tenderness. There is no rebound and no guarding.   Musculoskeletal: She exhibits tenderness. She exhibits no edema.   Neurological: She is alert and oriented to person, place, and time.   Skin: No rash noted. No erythema. No pallor.              Results of Labs/imaging   Labs have been reviewed:    Coagulation Profile:   Recent Labs  Lab 04/01/15  0427   PT 33.3*   PT INR 3.4   PTT 46*        CBC review:   Recent Labs  Lab 04/03/15  0512 04/02/15  0549 04/01/15  0427 03/31/15  2143 03/27/15  1816   WBC 12.57* 10.89* 6.90 8.23 6.75   HGB 10.9* 11.6* 10.6* 11.4* 11.0*   HEMATOCRIT 33.2* 36.7* 33.5* 35.4* 35.7*   PLATELETS 225 205 161 205 140   MCV 97.4 97.9 97.7 97.5 100.0   RDW 15 15 15 15 15    NEUTROPHILS  --   --  67 74  --    SEGMENTED NEUTROPHILS 70 79  --   --  81   LYMPHOCYTES AUTOMATED  --   --  19 12  --    EOSINOPHILS AUTOMATED  --   --  1 1  --    IMMATURE GRANULOCYTE  --   --  0 0  --    NEUTROPHILS ABSOLUTE  --   --  4.59 6.08  --    ABSOLUTE IMMATURE GRANULOCYTE  --   --  0.01 0.03  --       Chem Review:  Recent Labs  Lab 04/03/15  0512 04/02/15  0549 04/01/15  0427 03/31/15  2143 03/27/15  1816   SODIUM 140 141 141 138 138   POTASSIUM 4.1 4.6 3.8 3.7 4.0   CHLORIDE 109 107 107 104 105   CO2 22 26 24 24 25    BUN 28.2* 16.0 10.4 12.8 20.0*   CREATININE 0.7 0.8 0.7 0.8 0.8   GLUCOSE 121* 140* 90 112* 99   CALCIUM 8.6 9.2 8.4 9.0 8.8   MAGNESIUM  --   --  2.1  --   --    BILIRUBIN, TOTAL 0.4  --  1.1 0.9 1.9*   AST (SGOT) 25  --  19 24 21    ALT 21  --  12 12 16    ALKALINE PHOSPHATASE 86  --  90 104 89      Results     Procedure Component Value Units Date/Time    ANA PANEL [[id number]] Collected:  04/02/15 0544     ANA Qualitative Negative Updated:  04/03/15 1059     SSA 15      SSA Interp Negative      SSB 9      SSB Interp Negative      Sm 15      Sm Interp Negative      ANA RNP 12      RNP Interp Negative      Scleroderma SCL-70 20      Scl-70 Interp Negative      Jo-1 8      Jo-1 Interp Negative      Anti-DNA (DS) Ab Qn 36      dsDNA Interp Negative      Centromere 3      Centromere Interp Negative      Histone 7      Histone Interp Negative     CBC and differential [[id number]]  (Abnormal) Collected:  04/03/15 0512    Specimen Information:  Blood / Blood Updated:  04/03/15 0606     WBC 12.57 (H)  x10 3/uL      Hgb 10.9 (L) g/dL      Hematocrit 64.3 (L) %      Platelets 225 x10 3/uL      RBC 3.41 (L) x10 6/uL      MCV  97.4 fL      MCH 32.0 pg      MCHC 32.8 g/dL      RDW 15 %      MPV 10.2 fL     Manual Differential [[id number]]  (Abnormal) Collected:  04/03/15 0512     Segmented Neutrophils 70 % Updated:  04/03/15 0606     Band Neutrophils 20 %      Lymphocytes Manual 5 %      Monocytes Manual 5 %      Eosinophils Manual 0 %      Basophils Manual 0 %      Nucleated RBC 0 /100 WBC      Abs Seg Manual 8.80 (H) x10 3/uL      Bands Absolute 2.51 (H) x10 3/uL      Absolute Lymph Manual 0.63 x10 3/uL      Monocytes Absolute 0.63 x10 3/uL      Absolute Eos Manual 0.00 x10 3/uL      Absolute Baso Manual 0.00 x10 3/uL     Cell MorpHology [[id number]] Collected:  04/03/15 0512     Cell Morphology: Normal Updated:  04/03/15 0606     Platelet Estimate Normal     GFR [[id number]] Collected:  04/03/15 0512     EGFR >60.0 Updated:  04/03/15 0553    Comprehensive metabolic panel [[id number]]  (Abnormal) Collected:  04/03/15 0512    Specimen Information:  Blood Updated:  04/03/15 0553     Glucose 121 (H) mg/dL      BUN 02.7 (H) mg/dL      Creatinine 0.7 mg/dL      CALCIUM 8.6 mg/dL      Sodium 253 mEq/L      Potassium 4.1 mEq/L      Chloride 109 mEq/L      CO2 22 mEq/L      Anion Gap 9.0      Protein, Total 6.1 g/dL      Albumin 2.7 (L) g/dL      AST (SGOT) 25 U/L      ALT 21 U/L      Alkaline Phosphatase 86 U/L      Bilirubin, Total 0.4 mg/dL      Globulin 3.4 g/dL      Albumin/Globulin Ratio 0.8 (L)     CULTURE BLOOD AEROBIC AND ANAEROBIC [[id number]] Collected:  04/02/15 1823    Specimen Information:  Blood, Venipuncture Updated:  04/02/15 2202    Narrative:      1 BLUE+1 PURPLE    Blood Culture Aerobic/Anaerobic #2 [[id number]] Collected:  03/31/15 2143    Specimen Information:  Arm / Blood Updated:  04/02/15 1651    Narrative:      [id number] called Micro Results of BldCx. Results read back [id number], by [id number] on  04/01/2015 at  14:05  ORDER#: [id number]                                    ORDERED BY: Marlane Hatcher  SOURCE: Blood RAC                                    COLLECTED:  03/31/15 21:43  ANTIBIOTICS AT COLL.:  RECEIVED :  04/01/15 00:30  [id number] called Micro Results of BldCx. Results read back [id number], by [id number] on 04/01/2015 at 14:05  Culture Blood Aerobic and Anaerobic        PRELIM      04/02/15 16:51   +  04/01/15   Aerobic and Anaerobic Blood Culture Positive in less than 24 hrs             Gram Stain Shows: Gram positive cocci in pairs             Resembling Streptococcus species             Identification and susceptibility to follow  04/02/15   Culture Positive for Streptococcus species               Identification and susceptibility to follow        Blood Culture Aerobic/Anaerobic #1 [[id number]] Collected:  03/31/15 2143    Specimen Information:  Arm / Blood Updated:  04/02/15 1648    Narrative:      [id number] called Micro Results of BldCx. Results read back [id number], by [id number]  on 04/01/2015 at 13:28  ORDER#: [id number]                                    ORDERED BY: Marlane Hatcher  SOURCE: Blood LAC                                    COLLECTED:  03/31/15 21:43  ANTIBIOTICS AT COLL.:                                RECEIVED :  04/01/15 00:30  [id number] called Micro Results of BldCx. Results read back [id number], by [id number] on 04/01/2015 at 13:28  Culture Blood Aerobic and Anaerobic        PRELIM      04/02/15 16:48   +  04/01/15   Aerobic and Anaerobic Blood Culture Positive in less than 24 hrs             Gram Stain Shows: Gram positive cocci in pairs             Resembling Streptococcus species             Identification and susceptibility to follow  04/02/15   Growth of Streptococcus gallolyticus               Susceptibility to follow        CULTURE BLOOD AEROBIC AND ANAEROBIC [[id number]] Collected:  04/02/15 1106    Specimen Information:  Blood, Intravenous Line Updated:  04/02/15 1515    Narrative:       1 BLUE+1 PURPLE         Radiology reports have been reviewed:  Radiology Results (24 Hour)     Procedure Component Value Units Date/Time    CT Lumbar Spine W Contrast [[id number]] Collected:  04/02/15 2208    Order Status:  Completed Updated:  04/02/15 2218    Narrative:      History: Bacteremia, lower extremity weakness.    Findings: Contiguous axial, reformatted coronal and sagittal images  through the lumbar spine following administration of 100 cc of Visipaque  320. Correlation with radiographs dated April 01, 2015.    The lumbar vertebral  body alignment and vertebral body heights are  normally preserved. No acute fractures are seen.    At the L2-L3 level there is shallow broad-based posterior disc bulging,  mild spinal canal and neural foraminal narrowing.    At the L3-L4 level there is degenerative, slightly hypertrophic facet  disease. There is shallow posterior disc bulging, mild to moderate  spinal canal narrowing, mild neural foraminal narrowing.    At the L4-L5 level there is a vacuum disc phenomenon, facet  degeneration, shallow broad-based posterior disc bulging. There is mild  spinal canal narrowing, mild to moderate neural foraminal narrowing.    At the L5-S1 level there is disc degeneration, disc height narrowing,  shallow broad-based posterior disc bulging, endplate foraminal  osteophyte formation, moderate narrowing of the neural foramina.    No definite paraspinal edema or fluid collections are identified. No  destructive bony changes are apparent.      Impression:      Impression: Diffuse degenerative changes. Please see the discussion  above.    Terrilee Croak, MD   04/02/2015 10:14 PM                  Hospitalist   Signed by: Zada Finders   04/03/2015 12:41 PM

## 2015-04-03 NOTE — Progress Notes (Signed)
Infectious Disease            Progress Note    04/03/2015   Bethany Howell ZOX:[id number],XBJ:[id number] is a 79 y.o. female with past medical history significant for hypertension, cardiomyopathy, atrial fibrillation, hyperlipidemia, history of basal cell carcinoma, who is admitted for gram-positive bacteremia and lower extremity weakness.    Subjective:     Bethany Howell today Symptoms:  Stable.  Afebrile, feeling better, still complaining of lower extremity weakness.No shortness of breath,cough, chest pain, chest pressure. No anorexia, nausea, vomiting, diarrhea, abdominal pain. No dysuria, increase frequency. Other review of system is non contributory.    Objective:     Blood pressure 146/90, pulse 67, temperature 97.7 F (36.5 C), temperature source Temporal Artery, resp. rate 18, height 1.651 m (5\' 5" ), weight 96.6 kg (212 lb 15.4 oz), SpO2 97 %.    General Appearance: No apparent respiratory distress.    HEENT: Pallor positive, anicteric, moist mucous membranes. No thrush.  Neck: Full, range of motion.   Lungs:  Decreased breath sounds at bases, no wheezes and no rales.  Heart: S1 and S2, irregular.    Abdomen: Soft, bowel sounds are positive. No visceromegaly.  Neurological: Awake, responsive, moves all extremities,  pelvic tenderness.  Extremities: No rashes.  Psychiatric: Mood and affect is appropriate.    Laboratory And Diagnostic Studies:     Recent Labs      04/03/15   0512  04/02/15   0549   WBC  12.57*  10.89*   HGB  10.9*  11.6*   HEMATOCRIT  33.2*  36.7*   PLATELETS  225  205   SEGMENTED NEUTROPHILS  70  79     Recent Labs      04/03/15   0512  04/02/15   0549   SODIUM  140  141   POTASSIUM  4.1  4.6   CHLORIDE  109  107   CO2  22  26   BUN  28.2*  16.0   CREATININE  0.7  0.8   GLUCOSE  121*  140*   CALCIUM  8.6  9.2     Recent Labs      04/03/15   0512  04/01/15   0427   AST (SGOT)  25  19   ALT  21  12   ALKALINE PHOSPHATASE  86  90   PROTEIN, TOTAL  6.1  6.0   ALBUMIN  2.7*  2.8*   BILIRUBIN,  TOTAL  0.4  1.1       Current Med's:     Current Facility-Administered Medications   Medication Dose Route Frequency   . atorvastatin  10 mg Oral Daily   . cefTRIAXone  2 g Intravenous Q24H SCH   . digoxin  125 mcg Oral Daily   . docusate sodium  100 mg Oral Q12H SCH   . furosemide  20 mg Oral BID   . lactobacillus/streptococcus  1 capsule Oral Daily   . losartan  25 mg Oral Daily   . methylPREDNISolone  40 mg Intravenous Q12H SCH   . metoprolol XL  50 mg Oral BID   . rivaroxaban  20 mg Oral Daily with dinner   . Senna  8.6 mg Oral QHS   . vancomycin  1,500 mg Intravenous Q18H       Assessment:      Condition.  Guarded    Gram-positive bacteremia.   Lower extremity weakness, pelvic pain.   Atrial fibrillation.   Cardiomyopathy.  Hypertension.   Hyperlipidemia.   Pacemaker placement.    Plan:      Continue Rocephin   Continue Vancomycin   Will follow Cultures   Continue supportive care   Will follow echocardiogram   Physical therapy          Alfonzo Beers, M.D.,FACP  04/03/2015  7:40 AM

## 2015-04-03 NOTE — Progress Notes (Signed)
[facility]    An appointment has been scheduled for you at the  [facility] for:    Tuesday, May 3 at 1030  Day/Time    At Location        [address], [place]  [address]  [place], [place] [address]  [phone]    Please bring the following with you to your appointment:  ? Medications in their original bottles  ? Glucometer/blood sugar log (if diabetic)  ? Weight log (if heart failure)  ? Proof of income (to enroll in medication assistance programs-last tax return or last 2 months of pay stubs)    If this is your initial appointment, please come 15 minutes prior to scheduled appointment time to complete initial appointment forms.

## 2015-04-03 NOTE — Plan of Care (Signed)
Problem: Safety  Goal: Patient will be free from injury during hospitalization  Outcome: Progressing  Intervention: Provide and maintain safe environment  Call bell within reach, bed alarm on, hourly rounding done      Problem: Pain  Goal: Patient's pain/discomfort is manageable  Outcome: Progressing  Intervention: Offer non-pharmocological pain management interventions  No complaints of pain at this time

## 2015-04-04 LAB — MAN DIFF ONLY
Band Neutrophils Absolute: 0.59 10*3/uL (ref 0.00–1.00)
Band Neutrophils: 6 %
Basophils Absolute Manual: 0 10*3/uL (ref 0.00–0.20)
Basophils Manual: 0 %
Eosinophils Absolute Manual: 0 10*3/uL (ref 0.00–0.70)
Eosinophils Manual: 0 %
Lymphocytes Absolute Manual: 0.29 10*3/uL — ABNORMAL LOW (ref 0.50–4.40)
Lymphocytes Manual: 3 %
Monocytes Absolute: 0.1 10*3/uL (ref 0.00–1.20)
Monocytes Manual: 1 %
Neutrophils Absolute Manual: 8.85 10*3/uL — ABNORMAL HIGH (ref 1.80–8.10)
Nucleated RBC: 0 /100 WBC (ref 0–1)
Segmented Neutrophils: 90 %

## 2015-04-04 LAB — CBC AND DIFFERENTIAL
Hematocrit: 33.8 % — ABNORMAL LOW (ref 37.0–47.0)
Hgb: 10.7 g/dL — ABNORMAL LOW (ref 12.0–16.0)
MCH: 30.7 pg (ref 28.0–32.0)
MCHC: 31.7 g/dL — ABNORMAL LOW (ref 32.0–36.0)
MCV: 97.1 fL (ref 80.0–100.0)
MPV: 9.7 fL (ref 9.4–12.3)
Platelets: 221 10*3/uL (ref 140–400)
RBC: 3.48 10*6/uL — ABNORMAL LOW (ref 4.20–5.40)
RDW: 15 % (ref 12–15)
WBC: 9.83 10*3/uL (ref 3.50–10.80)

## 2015-04-04 LAB — BASIC METABOLIC PANEL
Anion Gap: 10 (ref 5.0–15.0)
BUN: 27.9 mg/dL — ABNORMAL HIGH (ref 7.0–19.0)
CO2: 22 mEq/L (ref 22–29)
Calcium: 8.2 mg/dL (ref 7.9–10.2)
Chloride: 109 mEq/L (ref 100–111)
Creatinine: 0.7 mg/dL (ref 0.6–1.0)
Glucose: 124 mg/dL — ABNORMAL HIGH (ref 70–100)
Potassium: 3.9 mEq/L (ref 3.5–5.1)
Sodium: 141 mEq/L (ref 136–145)

## 2015-04-04 LAB — SEDIMENTATION RATE: Sed Rate: 68 mm/Hr — ABNORMAL HIGH (ref 0–20)

## 2015-04-04 LAB — GFR: EGFR: >60

## 2015-04-04 LAB — IHS D-DIMER: D-Dimer: 0.51 ug/mL FEU (ref 0.00–0.51)

## 2015-04-04 LAB — CELL MORPHOLOGY
Cell Morphology: ABNORMAL — AB
Platelet Estimate: NORMAL

## 2015-04-04 LAB — C-REACTIVE PROTEIN: C-Reactive Protein: 1.8 mg/dL — ABNORMAL HIGH (ref 0.0–0.8)

## 2015-04-04 MED ORDER — ALBUTEROL-IPRATROPIUM 2.5-0.5 (3) MG/3ML IN SOLN
3.0000 mL | RESPIRATORY_TRACT | Status: DC
Start: 2015-04-05 — End: 2015-04-07
  Administered 2015-04-05 – 2015-04-07 (×9): 3 mL via RESPIRATORY_TRACT
  Filled 2015-04-04 (×9): qty 3

## 2015-04-04 NOTE — Plan of Care (Signed)
Problem: Safety  Goal: Patient will be free from injury during hospitalization  Outcome: Progressing  Intervention: Provide and maintain safe environment  Call bell within reach, bed alarm on, hourly rounding done      Problem: Pain  Goal: Patient's pain/discomfort is manageable  Outcome: Progressing  Intervention: Offer non-pharmocological pain management interventions  No complaints of pain at this time

## 2015-04-04 NOTE — Progress Notes (Signed)
Infectious Disease            Progress Note    04/04/2015   Bethany Howell RUE:[id number] is a 79 y.o. female with past medical history significant for hypertension, cardiomyopathy, atrial fibrillation, hyperlipidemia, history of basal cell carcinoma, who is admitted for gram-positive bacteremia and lower extremity weakness.    Subjective:     Bethany Howell today Symptoms:  Stable.  Afebrile, blood cultures are growing Streptococcus gallolyticus, still complaining of lower extremity weakness, complains of abdominal  discomfort .No shortness of breath,cough, chest pain, chest pressure. No dysuria, increase frequency. Other review of system is non contributory.    Objective:     Blood pressure 159/79, pulse 87, temperature 97.2 F (36.2 C), temperature source Temporal Artery, resp. rate 18, height 1.651 m (5\' 5" ), weight 96.6 kg (212 lb 15.4 oz), SpO2 95 %.    General Appearance: No apparent respiratory distress.    HEENT: Pallor positive, anicteric, moist mucous membranes. No thrush.  Neck: Full, range of motion.   Lungs:  Decreased breath sounds at bases, no wheezes and no rales.  Heart: S1 and S2, irregular.    Abdomen: Soft, bowel sounds are positive. No visceromegaly. Mild diffuse discomfort  Neurological: Awake, responsive, moves all extremities,  pelvic discomfort  Extremities: No rashes.  Psychiatric: Mood and affect is appropriate.    Laboratory And Diagnostic Studies:     Recent Labs      04/04/15   0434  04/03/15   0512   WBC  9.83  12.57*   HGB  10.7*  10.9*   HEMATOCRIT  33.8*  33.2*   PLATELETS  221  225   SEGMENTED NEUTROPHILS  90  70     Recent Labs      04/04/15   0434  04/03/15   0512   SODIUM  141  140   POTASSIUM  3.9  4.1   CHLORIDE  109  109   CO2  22  22   BUN  27.9*  28.2*   CREATININE  0.7  0.7   GLUCOSE  124*  121*   CALCIUM  8.2  8.6     Recent Labs      04/03/15   0512   AST (SGOT)  25   ALT  21   ALKALINE PHOSPHATASE  86   PROTEIN, TOTAL  6.1   ALBUMIN  2.7*   BILIRUBIN,  TOTAL  0.4       Current Med's:     Current Facility-Administered Medications   Medication Dose Route Frequency   . albuterol-ipratropium  3 mL Nebulization Q6H SCH   . atorvastatin  10 mg Oral Daily   . cefTRIAXone  2 g Intravenous Q24H SCH   . digoxin  125 mcg Oral Daily   . docusate sodium  100 mg Oral Q12H SCH   . fluticasone-salmeterol  1 puff Inhalation BID   . furosemide  20 mg Oral BID   . guaiFENesin  200 mg Oral QID   . lactobacillus/streptococcus  1 capsule Oral Daily   . losartan  25 mg Oral Daily   . methylPREDNISolone  40 mg Intravenous Q12H SCH   . metoprolol XL  50 mg Oral BID   . rivaroxaban  20 mg Oral Daily with dinner   . Senna  8.6 mg Oral QHS       Assessment:      Condition.  Guarded    Streptococcus gallolyticus bacteremia.   Lower extremity weakness, pelvic  pain.   Atrial fibrillation.   Cardiomyopathy.   Hypertension.   Hyperlipidemia.   Pacemaker placement.    Plan:      Continue Rocephin   Discontinue Vancomycin   GI evaluation for possible colonoscopy   Will follow Cultures   Continue supportive care   Physical therapy   Discussed with GI   Discussed with family   Discussed with Dr. Melvia Heaps, M.D.,FACP  04/04/2015  9:57 AM

## 2015-04-04 NOTE — Progress Notes (Addendum)
Discharge Planning:    Rounded with Dr. Alinda Sierras.     Anticipated discharge plan: Home with home health care vs home with no needs vs home with IVABX    The patient has a 45$ copay for each SN/ PT/OT visit, and is currently on the fence with regard to whether this is something she would like to pay for. She states that she is independent and feels at this time she may not need the services. Pt may also need IVABX. Sensitives are pending. The home health care liaisons at [phone] are aware to determine patients benefits for IVABX under her insurance plan.     DME:  1) Patient to receive a Rollator. Home health care liaisons are aware to arrange and determine benefits.     Follow-up PCP appointment scheduled for   Tuesday May 3rd at 1030 am at the [facility].     Transport:  Daughter will transport via personal car     Time: 1-2 days        Rutherford Nail, Charity fundraiser, BSN  [facility]   Clinical Case Manager.

## 2015-04-04 NOTE — Progress Notes (Signed)
[facility] HOSPITALIST  Progress Note    Patient Info:   Date/Time: 04/04/2015 / 4:53 PM   Patient Name:Bethany Howell   ZOX:[id number]    PCP: Christa See, MD   Admit Date:03/31/2015   Attending Physician:Lossie Kalp, Drucilla Schmidt, MD      Assessment and Plan:   -Bilateral groin pain lower back pain.  We cannot do MRI.  Patient has a pacemaker, bilateral lower extremity ultrasound negative.  No acute fracture.  D-dimer is negative.  Acyanotic and C-reactive protein slightly elevated, but CT scan with contrast lumbar spine did not show any acute process.   Continue with physical therapy.  Pain Control.  Patient is walking around in the hallway also to the restroom without any major problems and pain  -Bacteremia.  She recently had pacemaker battery exchange, possibly primary source.  Continue with IV antibiotics.  Repeat cultures negative.  Sensitivities still to follow.  Follow-up on infectious disease specialist recommendation.  2-D echocardiogram done.  She has mitral and tricuspid regurgitation, history of atrial fibrillation on anticoagulation.  I will consult cardiology.  -History of atrial fibrillation, rate controlled on Xarelto.  -Acute bronchitis with cough.  Continue Advair.  Continue DuoNeb breathing treatment.  Continue IV antibiotics.   -.  Hypertension, controlled.  Continue Cozaar.  Metoprolol.  -Cardiomyopathy with valvular problems, ejection fraction around 60 percent.  Continue diuretic, beta blocker, Cozaar.     Hospital Problems:  Principal Problem:    Pelvic pain  Active Problems:    HTN (hypertension)    Cardiomyopathy    Atrial fibrillation    HLD (hyperlipidemia)    Bacteremia     DVT Prohylaxis:lovenox    Code Status: Full Code   Disposition:home   PT/OT/ST/RT/CM/Nutrition input:   Type of Admission:Inpatient   Estimated Length of Stay (including stay in the ER receiving treatment): 2 -3 more days   Medical Necessity for stay:as aove     Subjective:   Chief Complaint:  Chief Complaint    Patient presents with   . Pelvic Pain   . Leg Swelling      Update on current symptoms:coughing   Review of Systems:Review of Systems   Constitutional: Positive for malaise/fatigue.   HENT: Negative for hearing loss and tinnitus.    Respiratory: Positive for cough. Negative for hemoptysis, sputum production, shortness of breath and wheezing.    Cardiovascular: Negative for chest pain, palpitations and orthopnea.   Musculoskeletal: Positive for myalgias. Negative for back pain, joint pain, falls and neck pain.   Skin: Negative for itching and rash.   Neurological: Positive for weakness. Negative for headaches.       Allergies:  Allergies   Allergen Reactions   . Codeine       Medications:Meds have been reviewed   Current Facility-Administered Medications   Medication Dose Route Frequency Last Rate Last Dose   . acetaminophen (TYLENOL) tablet 650 mg  650 mg Oral Q4H PRN       . albuterol (PROVENTIL) nebulizer solution 2.5 mg  2.5 mg Nebulization Q6H PRN       . albuterol-ipratropium (DUO-NEB) 2.5-0.5(3) mg/3 mL nebulizer 3 mL  3 mL Nebulization Q6H SCH   3 mL at 04/04/15 1417   . atorvastatin (LIPITOR) tablet 10 mg  10 mg Oral Daily   10 mg at 04/04/15 0916   . cefTRIAXone (ROCEPHIN) 2 g in sodium chloride 0.9 % 100 mL IVPB mini-bag plus  2 g Intravenous Q24H SCH 200 mL/hr at 04/04/15 0916 2 g at  04/04/15 0916   . cyclobenzaprine (FLEXERIL) tablet 10 mg  10 mg Oral TID PRN   10 mg at 04/01/15 1016   . digoxin (LANOXIN) tablet 125 mcg  125 mcg Oral Daily   125 mcg at 04/04/15 0916   . docusate sodium (COLACE) capsule 100 mg  100 mg Oral Q12H SCH   100 mg at 04/04/15 0916   . fluticasone-salmeterol (ADVAIR DISKUS) 250-50 MCG/DOSE 1 puff  1 puff Inhalation BID   1 puff at 04/04/15 0803   . furosemide (LASIX) tablet 20 mg  20 mg Oral BID   20 mg at 04/04/15 0916   . guaiFENesin (ROBITUSSIN) oral solution 200 mg  200 mg Oral QID   200 mg at 04/04/15 1155   . HYDROcodone-acetaminophen (NORCO) 5-325 MG per tablet 0.5-1  tablet  0.5-1 tablet Oral Q6H PRN   1 tablet at 04/04/15 1429   . lactobacillus/streptococcus (RISAQUAD) capsule 1 capsule  1 capsule Oral Daily   1 capsule at 04/04/15 0916   . losartan (COZAAR) tablet 25 mg  25 mg Oral Daily   25 mg at 04/04/15 0916   . methylPREDNISolone sodium succinate (Solu-MEDROL) injection 40 mg  40 mg Intravenous Q12H SCH   40 mg at 04/04/15 0916   . metoprolol XL (TOPROL-XL) 24 hr tablet 50 mg  50 mg Oral BID   50 mg at 04/04/15 0916   . morphine injection 2 mg  2 mg Intravenous Q4H PRN       . rivaroxaban (XARELTO) tablet 20 mg  20 mg Oral Daily with dinner   20 mg at 04/03/15 1720   . Senna (SENOKOT) tablet 8.6 mg  8.6 mg Oral QHS   8.6 mg at 04/02/15 2256        Objective:   Vitals: Vitals reviewed height is 1.651 m (5\' 5" ) and weight is 96.6 kg (212 lb 15.4 oz). Her temporal artery temperature is 97.2 F (36.2 C). Her blood pressure is 190/84 and her pulse is 117. Her respiration is 19 and oxygen saturation is 96%. Body mass index is 35.44 kg/(m^2).  Filed Vitals:    04/04/15 0149 04/04/15 0550 04/04/15 0803 04/04/15 1344   BP: 160/72 159/79  190/84   Pulse: 70 87 85 117   Temp: 97 F (36.1 C) 97.2 F (36.2 C)  97.2 F (36.2 C)   TempSrc: Temporal Artery Temporal Artery  Temporal Artery   Resp: 19 18 22 19    Height:       Weight:       SpO2: 94% 95%  96%   Intake and Output Summary (Last 24 hours) at Date Time     Intake/Output Summary (Last 24 hours) at 04/04/15 1653  Last data filed at 04/04/15 0800   Gross per 24 hour   Intake    240 ml   Output      0 ml   Net    240 ml      Physical Exam:  Physical Exam   Constitutional: She is oriented to person, place, and time.   Cardiovascular: Exam reveals no gallop and no friction rub.    Murmur heard.  Pulmonary/Chest: No respiratory distress. She has no wheezes. She has no rales.   Abdominal: She exhibits no distension. There is no tenderness. There is no rebound and no guarding.   Musculoskeletal: She exhibits tenderness. She exhibits  no edema.   Neurological: She is alert and oriented to person, place, and time.   Skin:  No rash noted. No erythema. No pallor.              Results of Labs/imaging   Labs have been reviewed:   Coagulation Profile:     Recent Labs  Lab 04/01/15  0427   PT 33.3*   PT INR 3.4   PTT 46*        CBC review:     Recent Labs  Lab 04/04/15  0434 04/03/15  0512 04/02/15  0549 04/01/15  0427 03/31/15  2143   WBC 9.83 12.57* 10.89* 6.90 8.23   HGB 10.7* 10.9* 11.6* 10.6* 11.4*   HEMATOCRIT 33.8* 33.2* 36.7* 33.5* 35.4*   PLATELETS 221 225 205 161 205   MCV 97.1 97.4 97.9 97.7 97.5   RDW 15 15 15 15 15    NEUTROPHILS  --   --   --  67 74   SEGMENTED NEUTROPHILS 90 70 79  --   --    LYMPHOCYTES AUTOMATED  --   --   --  19 12   EOSINOPHILS AUTOMATED  --   --   --  1 1   IMMATURE GRANULOCYTE  --   --   --  0 0   NEUTROPHILS ABSOLUTE  --   --   --  4.59 6.08   ABSOLUTE IMMATURE GRANULOCYTE  --   --   --  0.01 0.03      Chem Review:    Recent Labs  Lab 04/04/15  0434 04/03/15  0512 04/02/15  0549 04/01/15  0427 03/31/15  2143   SODIUM 141 140 141 141 138   POTASSIUM 3.9 4.1 4.6 3.8 3.7   CHLORIDE 109 109 107 107 104   CO2 22 22 26 24 24    BUN 27.9* 28.2* 16.0 10.4 12.8   CREATININE 0.7 0.7 0.8 0.7 0.8   GLUCOSE 124* 121* 140* 90 112*   CALCIUM 8.2 8.6 9.2 8.4 9.0   MAGNESIUM  --   --   --  2.1  --    BILIRUBIN, TOTAL  --  0.4  --  1.1 0.9   AST (SGOT)  --  25  --  19 24   ALT  --  21  --  12 12   ALKALINE PHOSPHATASE  --  86  --  90 104      Results     Procedure Component Value Units Date/Time    CULTURE BLOOD AEROBIC AND ANAEROBIC [[id number]] Collected:  04/02/15 1106    Specimen Information:  Blood, Intravenous Line Updated:  04/04/15 1521    Narrative:      ORDER#: [id number]                                    ORDERED BY: MANCHIREDDY, SU  SOURCE: Blood, Intravenous Line LT. A/C              COLLECTED:  04/02/15 11:06  ANTIBIOTICS AT COLL.:                                RECEIVED :  04/02/15 15:15  Culture Blood Aerobic and  Anaerobic        PRELIM      04/04/15 15:21  04/03/15   No Growth after 1 day/s of incubation.  04/04/15   No Growth after 2 day/s of incubation.  Basic Metabolic Panel [[id number]]  (Abnormal) Collected:  04/04/15 0434    Specimen Information:  Blood Updated:  04/04/15 0521     Glucose 124 (H) mg/dL      BUN 02.7 (H) mg/dL      Creatinine 0.7 mg/dL      CALCIUM 8.2 mg/dL      Sodium 253 mEq/L      Potassium 3.9 mEq/L      Chloride 109 mEq/L      CO2 22 mEq/L      Anion Gap 10.0     C Reactive Protein [[id number]]  (Abnormal) Collected:  04/04/15 0434    Specimen Information:  Blood Updated:  04/04/15 0521     C-Reactive Protein 1.8 (H) mg/dL     GFR [[id number]] Collected:  04/04/15 0434     EGFR >60.0 Updated:  04/04/15 0521    Sedimentation rate (ESR) [[id number]]  (Abnormal) Collected:  04/04/15 0434    Specimen Information:  Blood Updated:  04/04/15 0519     Sed Rate 68 (H) mm/Hr     Manual Differential [[id number]]  (Abnormal) Collected:  04/04/15 0434     Segmented Neutrophils 90 % Updated:  04/04/15 0519     Band Neutrophils 6 %      Lymphocytes Manual 3 %      Monocytes Manual 1 %      Eosinophils Manual 0 %      Basophils Manual 0 %      Nucleated RBC 0 /100 WBC      Abs Seg Manual 8.85 (H) x10 3/uL      Bands Absolute 0.59 x10 3/uL      Absolute Lymph Manual 0.29 (L) x10 3/uL      Monocytes Absolute 0.10 x10 3/uL      Absolute Eos Manual 0.00 x10 3/uL      Absolute Baso Manual 0.00 x10 3/uL     Cell MorpHology [[id number]]  (Abnormal) Collected:  04/04/15 0434     Cell Morphology: Abnormal (A) Updated:  04/04/15 0519     Hypochromia =1+ (A)      Polychromasia =1+ (A)      Ovalocytes =1+ (A)      Platelet Estimate Normal     CBC and differential [[id number]]  (Abnormal) Collected:  04/04/15 0434    Specimen Information:  Blood / Blood Updated:  04/04/15 0518     WBC 9.83 x10 3/uL      Hgb 10.7 (L) g/dL      Hematocrit 42.7 (L) %      Platelets 221 x10 3/uL      RBC 3.48 (L) x10 6/uL      MCV 97.1 fL      MCH  30.7 pg      MCHC 31.7 (L) g/dL      RDW 15 %      MPV 9.7 fL     D-Dimer [[id number]] Collected:  04/04/15 0434     D-Dimer 0.51 ug/mL FEU Updated:  04/04/15 0505    CULTURE BLOOD AEROBIC AND ANAEROBIC [[id number]] Collected:  04/02/15 1823    Specimen Information:  Blood, Venipuncture Updated:  04/04/15 0028    Narrative:      ORDER#: [id number]                                    ORDERED BY: Nathaneil Canary  SOURCE: Blood, Venipuncture Venipuncture l AC  COLLECTED:  04/02/15 18:23  ANTIBIOTICS AT COLL.:                                RECEIVED :  04/02/15 22:02  Culture Blood Aerobic and Anaerobic        PRELIM      04/03/15 22:21  04/03/15   No Growth after 1 day/s of incubation.           Radiology reports have been reviewed:  Radiology Results (24 Hour)     ** No results found for the last 24 hours. **                Hospitalist   Signed by: Zada Finders   04/04/2015 4:53 PM

## 2015-04-04 NOTE — Plan of Care (Signed)
Problem: Health Promotion  Goal: Vaccination Screening  All patients will be screened for current vaccination status on each admission.   Outcome: Completed Date Met:  04/04/15  Goal: Knowledge - disease process  Extent of understanding conveyed about a specific disease process.   Outcome: Progressing    Problem: Safety  Goal: Patient will be free from injury during hospitalization  Outcome: Progressing  Bed low, call bell in reach, bed alarm activated.    Problem: Pain  Goal: Patient's pain/discomfort is manageable  Outcome: Progressing    Problem: Psychosocial and Spiritual Needs  Goal: Demonstrates ability to cope with hospitalization/illness  Outcome: Progressing

## 2015-04-04 NOTE — Progress Notes (Signed)
Home Health Referral          Referral from Betsy Rifka/Melissa Edrick Kins (Case Manager) for home health care upon discharge.    By [employer], the patient has the right to freely choose a home care provider.  Arrangements have been made with:     A company of the patients choosing. We have supplied the patient with a listing of providers in your area who asked to be included and participate in Medicare.   [facility], a home care agency that provides both adult home care services which is a wholly owned and operated by [employer] and participates in [employer]   The preferred provider of your insurance company. Choosing a home care provider other than your insurance company's preferred provider may affect your insurance coverage.    The Home Health Care Referral Form acknowledging the voluntary selection of the home care company has been completed, signed, and is on file.      Home Health Discharge Information     Your doctor has ordered Skilled Nursing and Physical Therapy in-home service(s) for you while you recuperate at home, to assist you in the transition from hospital to home.      The agency that you or your representative chose to provide the service:   ]      [employer]:  Name of DME Company: [employer]-[phone] (Rollator ordered and to be delivered to home)]  Equipment Ordered:   Rollator        The above services were set up by:  Julien Girt  [facility] Health Liaison)   Phone     [phone]                                        Additional comments:       Signed by: Berton Mount [place]-Le  Date Time: 04/04/2015 11:44 AM

## 2015-04-05 LAB — PT/INR
PT INR: 1.9
PT: 21.7 s — ABNORMAL HIGH (ref 12.6–15.0)

## 2015-04-05 MED ORDER — MAGNESIUM CITRATE 1.745 GM/30ML PO SOLN
296.0000 mL | Freq: Once | ORAL | Status: AC
Start: 2015-04-05 — End: 2015-04-05
  Administered 2015-04-05: 16:00:00 296 mL via ORAL
  Filled 2015-04-05: qty 296

## 2015-04-05 MED ORDER — SODIUM CHLORIDE 0.9 % IV MBP
2.0000 g | INTRAVENOUS | Status: DC
Start: 2015-04-06 — End: 2015-04-07
  Administered 2015-04-06 – 2015-04-07 (×2): 2 g via INTRAVENOUS
  Filled 2015-04-05 (×2): qty 2000

## 2015-04-05 MED ORDER — HYDRALAZINE HCL 20 MG/ML IJ SOLN
10.0000 mg | INTRAMUSCULAR | Status: DC | PRN
Start: 2015-04-05 — End: 2015-04-07
  Administered 2015-04-05: 10 mg via INTRAVENOUS
  Filled 2015-04-05: qty 1

## 2015-04-05 MED ORDER — PEG 3350-KCL-NABCB-NACL-NASULF 236 G PO SOLR
2000.0000 mL | Freq: Once | ORAL | Status: AC
Start: 2015-04-05 — End: 2015-04-05
  Administered 2015-04-05: 2000 mL via ORAL
  Filled 2015-04-05: qty 4000

## 2015-04-05 NOTE — Consults (Signed)
[place] [facility] CARDIOLOGY CONSULTATION REPORT  [facility]    Date Time: 04/05/2015 11:34 AM  Patient Name: Bethany Howell  Requesting Physician: Zada Finders, MD       Reason for Consultation:   Valvular disease       History:   Haya Borjon is a 79 y.o. female admitted on 03/31/2015.  We have been asked by Zada Finders, MD,  to provide cardiac consultation, regarding valvular disease  Pt is from [place]. Admitted with froin and back pain. Pt has hx of cardiomyopathy and recent pacemaker generator change  She has been here for several days.  Echo done shows moderate mr and moderately severe tr EF is normal at 60%    Chronic afib on anticoagulation with xarelto   Acute bronichits and bacteremia noted with postiive blood cultures       Past Medical History:     Past Medical History   Diagnosis Date   . Hypertension    . Cardiomyopathy    . Atrial fibrillation      with pacer    . Arthritis    . Hypercholesterolemia    . Basal cell carcinoma        Past Surgical History:     Past Surgical History   Procedure Laterality Date   . Hysterectomy     . Replacement total knee         Family History:   No family history on file.    Social History:     History     Social History   . Marital Status: Widowed     Spouse Name: N/A   . Number of Children: N/A   . Years of Education: N/A     Social History Main Topics   . Smoking status: Never Smoker    . Smokeless tobacco: Not on file   . Alcohol Use: No   . Drug Use: No   . Sexual Activity: Not on file     Other Topics Concern   . Not on file     Social History Narrative       Allergies:     Allergies   Allergen Reactions   . Codeine        Medications:     Prescriptions prior to admission   Medication Sig   . albuterol (PROVENTIL HFA;VENTOLIN HFA) 108 (90 BASE) MCG/ACT inhaler Inhale 2 puffs into the lungs.   Marland Kitchen atorvastatin (LIPITOR) 10 MG tablet Take 10 mg by mouth daily.   . Biotin 1000 MCG tablet Take 1,000 mcg by mouth 3 (three) times daily.   . Cholecalciferol  (VITAMIN D-3 PO) Take by mouth.   . digoxin (LANOXIN) 0.125 MG tablet Take 125 mcg by mouth.   . furosemide (LASIX) 20 MG tablet Take 20 mg by mouth 2 (two) times daily.   Marland Kitchen HYDROcodone-acetaminophen (NORCO) 5-325 MG per tablet Take 0.5-1 tablets by mouth every 6 (six) hours as needed.   Marland Kitchen losartan (COZAAR) 25 MG tablet Take 25 mg by mouth daily.   . metoprolol XL (TOPROL-XL) 50 MG 24 hr tablet Take 50 mg by mouth 2 (two) times daily.   . Mometasone Furo-Formoterol Fum (DULERA IN) Inhale into the lungs.   . Potassium (POTASSIMIN PO) Take by mouth.   . rivaroxaban (XARELTO) 20 MG Tab Take 20 mg by mouth daily with dinner.   . traMADol (ULTRAM) 50 MG tablet Take 50 mg by mouth every 6 (six) hours as needed for Pain.  Current Facility-Administered Medications   Medication Dose Route Frequency Provider Last Rate Last Dose   . acetaminophen (TYLENOL) tablet 650 mg  650 mg Oral Q4H PRN Hartojo, Wibisono, MD       . albuterol (PROVENTIL) nebulizer solution 2.5 mg  2.5 mg Nebulization Q6H PRN Hartojo, Wibisono, MD       . albuterol-ipratropium (DUO-NEB) 2.5-0.5(3) mg/3 mL nebulizer 3 mL  3 mL Nebulization Q4H WA Traficante, Diane Lyn, DO   3 mL at 04/05/15 0756   . atorvastatin (LIPITOR) tablet 10 mg  10 mg Oral Daily Hartojo, Wibisono, MD   10 mg at 04/05/15 1024   . [START ON 04/06/2015] cefTRIAXone (ROCEPHIN) 2 g in sodium chloride 0.9 % 100 mL IVPB mini-bag plus  2 g Intravenous Q24H SCH Choudhary, Sarfraz A, MD       . cyclobenzaprine (FLEXERIL) tablet 10 mg  10 mg Oral TID PRN Judson Roch, MD   10 mg at 04/01/15 1016   . digoxin (LANOXIN) tablet 125 mcg  125 mcg Oral Daily Hartojo, Wibisono, MD   125 mcg at 04/05/15 1024   . docusate sodium (COLACE) capsule 100 mg  100 mg Oral Q12H SCH Judson Roch, MD   100 mg at 04/05/15 1024   . fluticasone-salmeterol (ADVAIR DISKUS) 250-50 MCG/DOSE 1 puff  1 puff Inhalation BID Zada Finders, MD   1 puff at 04/05/15 0756   . furosemide (LASIX) tablet 20 mg  20 mg Oral BID  Hartojo, Wibisono, MD   20 mg at 04/05/15 1024   . guaiFENesin (ROBITUSSIN) oral solution 200 mg  200 mg Oral QID Zada Finders, MD   200 mg at 04/05/15 1610   . HYDROcodone-acetaminophen (NORCO) 5-325 MG per tablet 0.5-1 tablet  0.5-1 tablet Oral Q6H PRN Hartojo, Wibisono, MD   1 tablet at 04/05/15 1024   . lactobacillus/streptococcus (RISAQUAD) capsule 1 capsule  1 capsule Oral Daily Oswald Hillock, NP   1 capsule at 04/05/15 1024   . losartan (COZAAR) tablet 25 mg  25 mg Oral Daily Hartojo, Wibisono, MD   25 mg at 04/05/15 1024   . magnesium citrate (CITROMA) solution 296 mL  296 mL Oral Once Gliem, Shana A, NP       . methylPREDNISolone sodium succinate (Solu-MEDROL) injection 40 mg  40 mg Intravenous Q12H [facility] Judson Roch, MD   40 mg at 04/05/15 1024   . metoprolol XL (TOPROL-XL) 24 hr tablet 50 mg  50 mg Oral BID Hartojo, Wibisono, MD   50 mg at 04/05/15 1024   . morphine injection 2 mg  2 mg Intravenous Q4H PRN Hartojo, Wibisono, MD       . polyethylene glycol (GoLYTELY,NuLYTELY) suspension 2,000 mL  2,000 mL Oral Once Gliem, Shana A, NP       . Senna (SENOKOT) tablet 8.6 mg  8.6 mg Oral QHS Judson Roch, MD   8.6 mg at 04/02/15 2256         Review of Systems:    Comprehensive review of systems including constitutional, eyes, ears, nose, mouth, throat, cardiovascular, GI, GU, musculoskeletal, integumentary, respiratory, neurologic, psychiatric, and endocrine is negative other than what is mentioned already in the history of present illness    Physical Exam:     Filed Vitals:    04/05/15 1003   BP: 166/77   Pulse: 78   Temp: 97 F (36.1 C)   Resp: 16   SpO2: 94%     Temp (24hrs), Avg:97.2 F (36.2 C), Min:97 F (  36.1 C), Max:97.7 F (36.5 C)      Intake and Output Summary (Last 24 hours) at Date Time    Intake/Output Summary (Last 24 hours) at 04/05/15 1134  Last data filed at 04/05/15 0700   Gross per 24 hour   Intake      0 ml   Output    400 ml   Net   -400 ml       GENERAL: Patient is in  no acute distress   HEENT: No scleral icterus or conjunctival pallor, moist mucous membranes   NECK: No jugular venous distention or thyromegaly, normal carotid upstrokes without bruits   CARDIAC: Normal apical impulse, regular rate and rhythm, with normal S1 and S2, and no murmurs, rubs, or gallops   CHEST: Clear to auscultation bilaterally, normal respiratory effort  ABDOMEN: No abdominal bruits, masses, or hepatosplenomegaly, nontender, non-distended, good bowel sounds   EXTREMITIES: No clubbing, cyanosis, or edema, 2+ DP, PT, and femoral pulses bilaterally without bruits  SKIN: No rash or jaundice   NEUROLOGIC: Alert and oriented to time, place and person, normal mood and affect  MUSCULOSKELETAL: Normal muscle strength and tone.      Labs Reviewed:       Recent Labs  Lab 03/31/15  2143   CREATINE KINASE (CK) 54       Recent Labs  Lab 03/31/15  2143   DIGOXIN LEVEL 0.38*           Recent Labs  Lab 04/03/15  0512   BILIRUBIN, TOTAL 0.4   PROTEIN, TOTAL 6.1   ALBUMIN 2.7*   ALT 21   AST (SGOT) 25       Recent Labs  Lab 04/01/15  0427   MAGNESIUM 2.1       Recent Labs  Lab 04/01/15  0427   PT 33.3*   PT INR 3.4   PTT 46*       Recent Labs  Lab 04/04/15  0434 04/03/15  0512 04/02/15  0549   WBC 9.83 12.57* 10.89*   HGB 10.7* 10.9* 11.6*   HEMATOCRIT 33.8* 33.2* 36.7*   PLATELETS 221 225 205       Recent Labs  Lab 04/04/15  0434 04/03/15  0512 04/02/15  0549   SODIUM 141 140 141   POTASSIUM 3.9 4.1 4.6   CHLORIDE 109 109 107   CO2 22 22 26    BUN 27.9* 28.2* 16.0   CREATININE 0.7 0.7 0.8   EGFR >60.0 >60.0 >60.0   GLUCOSE 124* 121* 140*   CALCIUM 8.2 8.6 9.2         Radiology   Radiological Procedure reviewed.      chest X-ray  Assessment:    Admitted with bronchits and lower back/pelvic pain   Chronic afib with anticoagulation   Nl ef by echo with moderate MR and TR    No pulmonary htn   Bacteremia this admission   Recent generator change of pacer done elsewhere       Recommendations:    Valvular disease as  per pt has been the same for several years. Currenlty euvolemic would proceed with colonoscopy without further cardiac testing.             Signed by: Amedeo Gory, MD      [facility]  NP Spectralink [phone] (8am-5pm)  MD Spectralink [phone] (8am-5pm)  After hours, non urgent consult line [phone]  After Hours, urgent consults [phone]

## 2015-04-05 NOTE — Progress Notes (Signed)
Informed by clin tech that pt's BP 182/103. Other VSS with HR 74. Pt denies pain, denies SOB, denies nausea, denies numbness, denies tingling, denies dizziness. Dr. Alinda Sierras informed via page. New orders received. Will continue to monitor.

## 2015-04-05 NOTE — Progress Notes (Signed)
Infectious Disease            Progress Note    04/05/2015   Bethany Howell ZOX:[id number] is a 79 y.o. female with past medical history significant for hypertension, cardiomyopathy, atrial fibrillation, hyperlipidemia, history of basal cell carcinoma, who is admitted for gram-positive bacteremia and lower extremity weakness.    Subjective:     [name] today Symptoms:  Stable.  Afebrile, feeling better. Still complains of weakness. Pain under control.No shortness of breath,cough, chest pain, chest pressure. No dysuria, increase frequency. Other review of system is non contributory.    Objective:     Blood pressure 166/77, pulse 78, temperature 97 F (36.1 C), temperature source Temporal Artery, resp. rate 16, height 1.651 m (5\' 5" ), weight 97.4 kg (214 lb 11.7 oz), SpO2 94 %.    General Appearance: No apparent respiratory distress.    HEENT: Pallor positive, anicteric, moist mucous membranes. No thrush.  Neck: Full, range of motion.   Lungs:  Decreased breath sounds at bases, no wheezes and no rales.  Heart: S1 and S2, irregular.    Abdomen: Soft, bowel sounds are positive. No visceromegaly. Mild diffuse discomfort  Neurological: Awake, responsive, moves all extremities.  Extremities: No rashes.  Psychiatric: Mood and affect is appropriate.    Laboratory And Diagnostic Studies:     Recent Labs      04/04/15   0434  04/03/15   0512   WBC  9.83  12.57*   HGB  10.7*  10.9*   HEMATOCRIT  33.8*  33.2*   PLATELETS  221  225   SEGMENTED NEUTROPHILS  90  70     Recent Labs      04/04/15   0434  04/03/15   0512   SODIUM  141  140   POTASSIUM  3.9  4.1   CHLORIDE  109  109   CO2  22  22   BUN  27.9*  28.2*   CREATININE  0.7  0.7   GLUCOSE  124*  121*   CALCIUM  8.2  8.6     Recent Labs      04/03/15   0512   AST (SGOT)  25   ALT  21   ALKALINE PHOSPHATASE  86   PROTEIN, TOTAL  6.1   ALBUMIN  2.7*   BILIRUBIN, TOTAL  0.4       Current Med's:     Current Facility-Administered Medications   Medication  Dose Route Frequency   . albuterol-ipratropium  3 mL Nebulization Q4H WA   . atorvastatin  10 mg Oral Daily   . cefTRIAXone  2 g Intravenous Q24H SCH   . digoxin  125 mcg Oral Daily   . docusate sodium  100 mg Oral Q12H SCH   . fluticasone-salmeterol  1 puff Inhalation BID   . furosemide  20 mg Oral BID   . guaiFENesin  200 mg Oral QID   . lactobacillus/streptococcus  1 capsule Oral Daily   . losartan  25 mg Oral Daily   . methylPREDNISolone  40 mg Intravenous Q12H SCH   . metoprolol XL  50 mg Oral BID   . Senna  8.6 mg Oral QHS       Assessment:      Condition.  Guarded    Streptococcus gallolyticus bacteremia.   Lower extremity weakness, pelvic pain.   Atrial fibrillation.   Cardiomyopathy.   Hypertension.   Hyperlipidemia.   Pacemaker placement.    Plan:  Continue Rocephin   Possible colonoscopy tomorrow   Continue supportive care   Physical therapy            Alfonzo Beers, M.D.,FACP  04/05/2015  11:09 AM

## 2015-04-05 NOTE — Anesthesia Preprocedure Evaluation (Addendum)
Anesthesia Evaluation    AIRWAY    Mallampati: III    TM distance: >3 FB  Neck ROM: full  Mouth Opening:full   CARDIOVASCULAR    cardiovascular exam normal       DENTAL    no notable dental hx    (+) upper dentures and lower dentures   PULMONARY    pulmonary exam normal     OTHER FINDINGS                      Anesthesia Plan    ASA 3     general               (Echo: EF60%, mild LVH, moderate to severe TR    Admitted with Bronchitis    Safko from CV states no further w/u needed, OK to proceed with procedure.)      intravenous induction   Detailed anesthesia plan: general IV            informed consent obtained    Plan discussed with CRNA.    ECG reviewed (AF)  pertinent labs reviewed  imaging results reviewed

## 2015-04-05 NOTE — Consults (Signed)
GASTROENTEROLOGY CONSULTATION  [facility]  [facility]  [phone]        Date Time: 04/05/2015 11:15 AM  Patient Name: Bethany Howell  Requesting Physician: Zada Finders, MD      Reason for Consultation:   Colonoscopy     Assessment:   Lower abdominal pain/groin pain  blood cultures are growing Streptococcus gallolyticus  H/O pacemaker due to afib - on Xarelto     Plan:   1. Colonoscopy planned for tomorrow est time 2 pm  2. NPO after midnight   3. Start bowel prep at 1600  Informed consent obtained. The risks, benefits, possible complications, and alternatives to the procedure were discussed with the patient and patient understands and wishes to proceed.   Cards clearance, INR needs to be corrected, PT/INR ordered  Plan discussed with pt, nurse and Dr. Alinda Sierras     History:   Bethany Howell is a 79 y.o. female who presents to the hospital on 03/31/2015 with pelvic pain.  Pt reports she was having lower abdominal pain that radiated to bilateral groin area for a few days.  Reports having colonoscopy in 2008 in [place].  Pt does have some constipation at home and will use a fleets enema sometimes.   Denies blood in stools, diarrhea.  Past medical history significant for hypertension, cardiomyopathy, atrial fibrillation, hyperlipidemia, history of basal cell carcinoma.  Recently had batteries changed in pacemaker April 6 in [place] per patient.     Endoscopic History   EGD:    Colonoscopy: 2008 in [place] polyps removed per patient   ERCP:    Past Medical History:     Past Medical History   Diagnosis Date   . Hypertension    . Cardiomyopathy    . Atrial fibrillation      with pacer    . Arthritis    . Hypercholesterolemia    . Basal cell carcinoma             Past Surgical History:     Past Surgical History   Procedure Laterality Date   . Hysterectomy     . Replacement total knee         Family History:   No family history on file.    Social History:     History     Social History   . Marital Status:  Widowed     Spouse Name: N/A   . Number of Children: N/A   . Years of Education: N/A     Social History Main Topics   . Smoking status: Never Smoker    . Smokeless tobacco: Not on file   . Alcohol Use: No   . Drug Use: No   . Sexual Activity: Not on file     Other Topics Concern   . Not on file     Social History Narrative       Allergies:     Allergies   Allergen Reactions   . Codeine        Medications:     Current Facility-Administered Medications   Medication Dose Route Frequency   . albuterol-ipratropium  3 mL Nebulization Q4H WA   . atorvastatin  10 mg Oral Daily   . cefTRIAXone  2 g Intravenous Q24H SCH   . digoxin  125 mcg Oral Daily   . docusate sodium  100 mg Oral Q12H SCH   . fluticasone-salmeterol  1 puff Inhalation BID   . furosemide  20 mg Oral BID   .  guaiFENesin  200 mg Oral QID   . lactobacillus/streptococcus  1 capsule Oral Daily   . losartan  25 mg Oral Daily   . magnesium citrate  296 mL Oral Once   . methylPREDNISolone  40 mg Intravenous Q12H SCH   . metoprolol XL  50 mg Oral BID   . polyethylene glycol  2,000 mL Oral Once   . Senna  8.6 mg Oral QHS       Review of Systems:   A comprehensive review of systems was:   History obtained from the patient  Gastrointestinal ROS:  positive for - abdominal pain  negative for - appetite loss, blood in stools, diarrhea, heartburn, hematemesis, melena, nausea/vomiting or swallowing difficulty/pain    A comprehensive review of systems was: History obtained from the patient  The patient denies any additional changes to their otic, opthalmologic, dermatologic, pulmonary, cardiac, genitourinary, musculoskeletal, hematologic, constitutional, or psychiatric systems.      Physical Exam:     Filed Vitals:    04/05/15 1003   BP: 166/77   Pulse: 78   Temp: 97 F (36.1 C)   Resp: 16   SpO2: 94%       Intake and Output Summary (Last 24 hours) at Date Time    Intake/Output Summary (Last 24 hours) at 04/05/15 1115  Last data filed at 04/05/15 0700   Gross per 24 hour    Intake      0 ml   Output    400 ml   Net   -400 ml       General appearance - alert, well appearing, and in no distress  Mental status - alert, oriented to person, place, and time  Eyes - pupils equal and reactive, extraocular eye movements intact  Chest - clear to auscultation, no wheezes, rales or rhonchi, symmetric air entry  Heart - a-fib, normal S1, S2, no murmurs, rubs, clicks or gallops  Abdomen - soft, obese, nontender, nondistended, no masses or organomegaly  Extremities - peripheral pulses normal, +1 pedal edema, no clubbing or cyanosis      Lab:     Results     Procedure Component Value Units Date/Time    CULTURE BLOOD AEROBIC AND ANAEROBIC [[id number]] Collected:  04/02/15 1823    Specimen Information:  Blood, Venipuncture Updated:  04/04/15 2221    Narrative:      ORDER#: [id number]                                    ORDERED BY: Nathaneil Canary  SOURCE: Blood, Venipuncture Venipuncture l AC        COLLECTED:  04/02/15 18:23  ANTIBIOTICS AT COLL.:                                RECEIVED :  04/02/15 22:02  Culture Blood Aerobic and Anaerobic        PRELIM      04/04/15 22:21  04/03/15   No Growth after 1 day/s of incubation.  04/04/15   No Growth after 2 day/s of incubation.      CULTURE BLOOD AEROBIC AND ANAEROBIC [[id number]] Collected:  04/02/15 1106    Specimen Information:  Blood, Intravenous Line Updated:  04/04/15 1521    Narrative:      ORDER#: [id number]  ORDERED BY: MANCHIREDDY, SU  SOURCE: Blood, Intravenous Line LT. A/C              COLLECTED:  04/02/15 11:06  ANTIBIOTICS AT COLL.:                                RECEIVED :  04/02/15 15:15  Culture Blood Aerobic and Anaerobic        PRELIM      04/04/15 15:21  04/03/15   No Growth after 1 day/s of incubation.  04/04/15   No Growth after 2 day/s of incubation.              Radiology:   Radiological Procedure reviewed.     Signed by: Tenna Delaine

## 2015-04-05 NOTE — Plan of Care (Signed)
Problem: Health Promotion  Goal: Risk control - tobacco abuse  Actions to eliminate or reduce tobacco use.   Outcome: Completed Date Met:  04/05/15  Never smoked    Problem: Safety  Goal: Patient will be free from injury during hospitalization  Outcome: Progressing  Bed in low position, call bell within reach, bed alarm activated.    Problem: Pain  Goal: Patient's pain/discomfort is manageable  Outcome: Progressing    Problem: Psychosocial and Spiritual Needs  Goal: Demonstrates ability to cope with hospitalization/illness  Outcome: Progressing

## 2015-04-05 NOTE — Plan of Care (Signed)
Problem: Safety  Goal: Patient will be free from injury during hospitalization  Outcome: Progressing  Fall risk assessment q shift. Call bell, overhead table, and assistive devices within reach. Pt instructed to call for assistance. Bed alarm activated. Bed in lowest position.  Intervention: Assess patient's risk for falls and implement fall prevention plan of care per policy  Fall risk assessment q shift. Call bell, overhead table, and assistive devices within reach. Pt instructed to call for assistance. Bed alarm activated. Bed in lowest position.  Intervention: Provide and maintain safe environment  Fall risk assessment q shift. Call bell, overhead table, and assistive devices within reach. Pt instructed to call for assistance. Bed alarm activated. Bed in lowest position.  Intervention: Use appropriate transfer methods  Assistive devices within reach.  Intervention: Ensure appropriate safety devices are available at the bedside  Fall risk assessment q shift. Call bell, overhead table, and assistive devices within reach. Pt instructed to call for assistance. Bed alarm activated. Bed in lowest position.  Intervention: Include patient/family/caregiver in decisions related to safety  .  Intervention: Hourly rounding.  .  Intervention: Assess for patient's risk for elopement and implement Elopement Risk plan per policy  .      Problem: Pain  Goal: Patient's pain/discomfort is manageable  Outcome: Progressing  Pain assessment q shift and prn.  Intervention: Include patient/family/caregiver in decisions related to pain management  .  Intervention: Offer non-pharmocological pain management interventions  .      Problem: Moderate/High Fall Risk Score >5  Goal: Patient will remain free of falls  Outcome: Progressing  Fall risk assessment q shift. Call bell, overhead table, and assistive devices within reach. Pt instructed to call for assistance. Bed alarm activated. Bed in lowest position.

## 2015-04-05 NOTE — Progress Notes (Signed)
[facility] HOSPITALIST  Progress Note    Patient Info:   Date/Time: 04/05/2015 / 4:12 PM   Patient Name:Bethany Howell   NWG:[id number]    PCP: Christa See, MD   Admit Date:03/31/2015   Attending Physician:Verna Desrocher, Drucilla Schmidt, MD      Assessment and Plan:   -Bilateral groin pain lower back pain.  We cannot do MRI.  Patient has a pacemaker, bilateral lower extremity ultrasound negative.  No acute fracture.  D-dimer is negative.  ESR and C-reactive protein slightly elevated, but CT scan with contrast lumbar spine did not show any acute process.   Continue with physical therapy.  Pain Control.  Patient is walking around in the hallway also to the restroom without any major problems and pain  -Bacteremia. Streptococcus gallolyticus .  We will do colonoscopy to rule out colon cancer organism often seen in individual with colon cancer  -History of atrial fibrillation, rate controlled Xarelto on hold for colonoscopy tomorrow  -Acute bronchitis with cough.  Continue Advair.  Continue DuoNeb breathing treatment.  Continue IV antibiotics.   -.  Hypertension, controlled.  Continue Cozaar.  Metoprolol.  -Cardiomyopathy with valvular problems, ejection fraction around 60 percent.  Continue diuretic, beta blocker, Cozaar.  Consulted cardiologist.  Follow-up on recommendation     Hospital Problems:  Principal Problem:    Pelvic pain  Active Problems:    HTN (hypertension)    Cardiomyopathy    Atrial fibrillation    HLD (hyperlipidemia)    Bacteremia     DVT Prohylaxis:lovenox    Code Status: Full Code   Disposition:home   PT/OT/ST/RT/CM/Nutrition input:   Type of Admission:Inpatient   Estimated Length of Stay (including stay in the ER receiving treatment): 1-2 more days   Medical Necessity for stay:as aove     Subjective:   Chief Complaint:  Chief Complaint   Patient presents with   . Pelvic Pain   . Leg Swelling      Update on current symptoms:coughing   Review of Systems:Review of Systems   Constitutional: Positive for  malaise/fatigue.   HENT: Negative for hearing loss and tinnitus.    Respiratory: Positive for cough. Negative for hemoptysis, sputum production, shortness of breath and wheezing.    Cardiovascular: Negative for chest pain, palpitations and orthopnea.   Musculoskeletal: Positive for myalgias. Negative for back pain, joint pain, falls and neck pain.   Skin: Negative for itching and rash.   Neurological: Positive for weakness. Negative for headaches.       Allergies:  Allergies   Allergen Reactions   . Codeine       Medications:Meds have been reviewed   Current Facility-Administered Medications   Medication Dose Route Frequency Last Rate Last Dose   . acetaminophen (TYLENOL) tablet 650 mg  650 mg Oral Q4H PRN       . albuterol (PROVENTIL) nebulizer solution 2.5 mg  2.5 mg Nebulization Q6H PRN       . albuterol-ipratropium (DUO-NEB) 2.5-0.5(3) mg/3 mL nebulizer 3 mL  3 mL Nebulization Q4H WA   3 mL at 04/05/15 0756   . atorvastatin (LIPITOR) tablet 10 mg  10 mg Oral Daily   10 mg at 04/05/15 1024   . [START ON 04/06/2015] cefTRIAXone (ROCEPHIN) 2 g in sodium chloride 0.9 % 100 mL IVPB mini-bag plus  2 g Intravenous Q24H SCH       . cyclobenzaprine (FLEXERIL) tablet 10 mg  10 mg Oral TID PRN   10 mg at 04/01/15 1016   .  digoxin (LANOXIN) tablet 125 mcg  125 mcg Oral Daily   125 mcg at 04/05/15 1024   . docusate sodium (COLACE) capsule 100 mg  100 mg Oral Q12H SCH   100 mg at 04/05/15 1024   . fluticasone-salmeterol (ADVAIR DISKUS) 250-50 MCG/DOSE 1 puff  1 puff Inhalation BID   1 puff at 04/05/15 0756   . furosemide (LASIX) tablet 20 mg  20 mg Oral BID   20 mg at 04/05/15 1024   . guaiFENesin (ROBITUSSIN) oral solution 200 mg  200 mg Oral QID   200 mg at 04/05/15 1211   . HYDROcodone-acetaminophen (NORCO) 5-325 MG per tablet 0.5-1 tablet  0.5-1 tablet Oral Q6H PRN   1 tablet at 04/05/15 1024   . lactobacillus/streptococcus (RISAQUAD) capsule 1 capsule  1 capsule Oral Daily   1 capsule at 04/05/15 1024   . losartan  (COZAAR) tablet 25 mg  25 mg Oral Daily   25 mg at 04/05/15 1024   . magnesium citrate (CITROMA) solution 296 mL  296 mL Oral Once       . methylPREDNISolone sodium succinate (Solu-MEDROL) injection 40 mg  40 mg Intravenous Q12H SCH   40 mg at 04/05/15 1024   . metoprolol XL (TOPROL-XL) 24 hr tablet 50 mg  50 mg Oral BID   50 mg at 04/05/15 1024   . morphine injection 2 mg  2 mg Intravenous Q4H PRN       . polyethylene glycol (GoLYTELY,NuLYTELY) suspension 2,000 mL  2,000 mL Oral Once       . Senna (SENOKOT) tablet 8.6 mg  8.6 mg Oral QHS   8.6 mg at 04/02/15 2256        Objective:   Vitals: Vitals reviewed height is 1.651 m (5\' 5" ) and weight is 97.4 kg (214 lb 11.7 oz). Her temporal artery temperature is 97.3 F (36.3 C). Her blood pressure is 186/90 and her pulse is 88. Her respiration is 18 and oxygen saturation is 95%. Body mass index is 35.73 kg/(m^2).  Filed Vitals:    04/05/15 0553 04/05/15 0756 04/05/15 1003 04/05/15 1347   BP: 180/86  166/77 186/90   Pulse: 94 84 78 88   Temp: 97.2 F (36.2 C)  97 F (36.1 C) 97.3 F (36.3 C)   TempSrc: Temporal Artery  Temporal Artery Temporal Artery   Resp: 23 20 16 18    Height:       Weight: 97.4 kg (214 lb 11.7 oz)      SpO2: 96%  94% 95%   Intake and Output Summary (Last 24 hours) at Date Time     Intake/Output Summary (Last 24 hours) at 04/05/15 1612  Last data filed at 04/05/15 1211   Gross per 24 hour   Intake      0 ml   Output    700 ml   Net   -700 ml      Physical Exam:  Physical Exam   Constitutional: She is oriented to person, place, and time.   Cardiovascular: Exam reveals no gallop and no friction rub.    Murmur heard.  Pulmonary/Chest: No respiratory distress. She has no wheezes. She has no rales.   Abdominal: She exhibits no distension. There is tenderness. There is no rebound and no guarding.   Musculoskeletal: She exhibits no edema or tenderness.   Neurological: She is alert and oriented to person, place, and time.   Skin: No rash noted. No  erythema. No pallor.  Results of Labs/imaging   Labs have been reviewed:   Coagulation Profile:     Recent Labs  Lab 04/05/15  1224 04/01/15  0427   PT 21.7* 33.3*   PT INR 1.9 3.4   PTT  --  46*        CBC review:     Recent Labs  Lab 04/04/15  0434 04/03/15  0512 04/02/15  0549 04/01/15  0427 03/31/15  2143   WBC 9.83 12.57* 10.89* 6.90 8.23   HGB 10.7* 10.9* 11.6* 10.6* 11.4*   HEMATOCRIT 33.8* 33.2* 36.7* 33.5* 35.4*   PLATELETS 221 225 205 161 205   MCV 97.1 97.4 97.9 97.7 97.5   RDW 15 15 15 15 15    NEUTROPHILS  --   --   --  67 74   SEGMENTED NEUTROPHILS 90 70 79  --   --    LYMPHOCYTES AUTOMATED  --   --   --  19 12   EOSINOPHILS AUTOMATED  --   --   --  1 1   IMMATURE GRANULOCYTE  --   --   --  0 0   NEUTROPHILS ABSOLUTE  --   --   --  4.59 6.08   ABSOLUTE IMMATURE GRANULOCYTE  --   --   --  0.01 0.03      Chem Review:    Recent Labs  Lab 04/04/15  0434 04/03/15  0512 04/02/15  0549 04/01/15  0427 03/31/15  2143   SODIUM 141 140 141 141 138   POTASSIUM 3.9 4.1 4.6 3.8 3.7   CHLORIDE 109 109 107 107 104   CO2 22 22 26 24 24    BUN 27.9* 28.2* 16.0 10.4 12.8   CREATININE 0.7 0.7 0.8 0.7 0.8   GLUCOSE 124* 121* 140* 90 112*   CALCIUM 8.2 8.6 9.2 8.4 9.0   MAGNESIUM  --   --   --  2.1  --    BILIRUBIN, TOTAL  --  0.4  --  1.1 0.9   AST (SGOT)  --  25  --  19 24   ALT  --  21  --  12 12   ALKALINE PHOSPHATASE  --  86  --  90 104      Results     Procedure Component Value Units Date/Time    CULTURE BLOOD AEROBIC AND ANAEROBIC [[id number]] Collected:  04/02/15 1106    Specimen Information:  Blood, Intravenous Line Updated:  04/05/15 1521    Narrative:      ORDER#: [id number]                                    ORDERED BY: MANCHIREDDY, SU  SOURCE: Blood, Intravenous Line LT. A/C              COLLECTED:  04/02/15 11:06  ANTIBIOTICS AT COLL.:                                RECEIVED :  04/02/15 15:15  Culture Blood Aerobic and Anaerobic        PRELIM      04/05/15 15:21  04/03/15   No Growth after 1 day/s of  incubation.  04/04/15   No Growth after 2 day/s of incubation.  04/05/15   No Growth after 3 day/s of incubation.  Prothrombin time/INR [[id number]]  (Abnormal) Collected:  04/05/15 1224     PT 21.7 (H) sec Updated:  04/05/15 1256     PT INR 1.9      PT Anticoag. Given Within 48 hrs. Urokinase     CULTURE BLOOD AEROBIC AND ANAEROBIC [[id number]] Collected:  04/02/15 1823    Specimen Information:  Blood, Venipuncture Updated:  04/04/15 2221    Narrative:      ORDER#: [id number]                                    ORDERED BY: Nathaneil Canary  SOURCE: Blood, Venipuncture Venipuncture l AC        COLLECTED:  04/02/15 18:23  ANTIBIOTICS AT COLL.:                                RECEIVED :  04/02/15 22:02  Culture Blood Aerobic and Anaerobic        PRELIM      04/04/15 22:21  04/03/15   No Growth after 1 day/s of incubation.  04/04/15   No Growth after 2 day/s of incubation.           Radiology reports have been reviewed:  Radiology Results (24 Hour)     ** No results found for the last 24 hours. **                Hospitalist   Signed by: Zada Finders   04/05/2015 4:12 PM

## 2015-04-06 ENCOUNTER — Inpatient Hospital Stay: Payer: Medicare (Managed Care) | Admitting: Anesthesiology

## 2015-04-06 ENCOUNTER — Encounter
Admission: EM | Disposition: A | Discharge status: Home Health | Payer: Self-pay | Source: Home / Self Care | Attending: Internal Medicine

## 2015-04-06 LAB — CBC AND DIFFERENTIAL
Basophils Absolute Automated: 0.02 10*3/uL (ref 0.00–0.20)
Basophils Automated: 0 %
Eosinophils Absolute Automated: 0 10*3/uL (ref 0.00–0.70)
Eosinophils Automated: 0 %
Hematocrit: 41.5 % (ref 37.0–47.0)
Hgb: 13.1 g/dL (ref 12.0–16.0)
Immature Granulocytes Absolute: 0.22 10*3/uL — ABNORMAL HIGH
Immature Granulocytes: 2 %
Lymphocytes Absolute Automated: 0.93 10*3/uL (ref 0.50–4.40)
Lymphocytes Automated: 10 %
MCH: 31 pg (ref 28.0–32.0)
MCHC: 31.6 g/dL — ABNORMAL LOW (ref 32.0–36.0)
MCV: 98.1 fL (ref 80.0–100.0)
MPV: 10 fL (ref 9.4–12.3)
Monocytes Absolute Automated: 0.76 10*3/uL (ref 0.00–1.20)
Monocytes: 8 %
Neutrophils Absolute: 8.06 10*3/uL (ref 1.80–8.10)
Neutrophils: 82 %
Platelets: 228 10*3/uL (ref 140–400)
RBC: 4.23 10*6/uL (ref 4.20–5.40)
RDW: 15 % (ref 12–15)
WBC: 9.77 10*3/uL (ref 3.50–10.80)

## 2015-04-06 LAB — ANTIBIOTIC SENSITIVITY TESTING

## 2015-04-06 LAB — PT/INR
PT INR: 1.2
PT: 14.8 s (ref 12.6–15.0)

## 2015-04-06 LAB — BASIC METABOLIC PANEL
Anion Gap: 12 (ref 5.0–15.0)
BUN: 18.4 mg/dL (ref 7.0–19.0)
CO2: 27 mEq/L (ref 22–29)
Calcium: 8.7 mg/dL (ref 7.9–10.2)
Chloride: 104 mEq/L (ref 100–111)
Creatinine: 0.7 mg/dL (ref 0.6–1.0)
Glucose: 115 mg/dL — ABNORMAL HIGH (ref 70–100)
Potassium: 3.9 mEq/L (ref 3.5–5.1)
Sodium: 143 mEq/L (ref 136–145)

## 2015-04-06 LAB — CELL MORPHOLOGY
Cell Morphology: NORMAL
Platelet Estimate: NORMAL

## 2015-04-06 LAB — GFR: EGFR: >60

## 2015-04-06 SURGERY — DONT USE, USE 1094-COLONOSCOPY, DIAGNOSTIC (SCREENING)
Anesthesia: Anesthesia General | Site: Anus | Wound class: Clean Contaminated

## 2015-04-06 MED ORDER — DIPHENHYDRAMINE HCL 50 MG/ML IJ SOLN
6.2500 mg | Freq: Four times a day (QID) | INTRAMUSCULAR | Status: DC | PRN
Start: 2015-04-06 — End: 2015-04-06

## 2015-04-06 MED ORDER — PROPOFOL 10 MG/ML IV EMUL
INTRAVENOUS | Status: AC
Start: 2015-04-06 — End: ?
  Filled 2015-04-06: qty 20

## 2015-04-06 MED ORDER — ONDANSETRON HCL 4 MG/2ML IJ SOLN
4.0000 mg | Freq: Once | INTRAMUSCULAR | Status: DC | PRN
Start: 2015-04-06 — End: 2015-04-06

## 2015-04-06 MED ORDER — LIDOCAINE HCL 2 % IJ SOLN
INTRAMUSCULAR | Status: DC | PRN
Start: 2015-04-06 — End: 2015-04-06
  Administered 2015-04-06: 100 mg via INTRAVENOUS

## 2015-04-06 MED ORDER — LIDOCAINE HCL (PF) 2 % IJ SOLN
INTRAMUSCULAR | Status: AC
Start: 2015-04-06 — End: ?
  Filled 2015-04-06: qty 5

## 2015-04-06 MED ORDER — ZOLPIDEM TARTRATE 5 MG PO TABS
5.0000 mg | ORAL_TABLET | Freq: Every evening | ORAL | Status: DC | PRN
Start: 2015-04-06 — End: 2015-04-07
  Administered 2015-04-06: 5 mg via ORAL
  Filled 2015-04-06: qty 1

## 2015-04-06 MED ORDER — PROMETHAZINE HCL 25 MG/ML IJ SOLN
6.2500 mg | Freq: Once | INTRAMUSCULAR | Status: DC | PRN
Start: 2015-04-06 — End: 2015-04-06

## 2015-04-06 MED ORDER — METOPROLOL TARTRATE 1 MG/ML IV SOLN
INTRAVENOUS | Status: DC | PRN
Start: 2015-04-06 — End: 2015-04-06
  Administered 2015-04-06: 1 mg via INTRAVENOUS
  Administered 2015-04-06: 2 mg via INTRAVENOUS

## 2015-04-06 MED ORDER — PROPOFOL INFUSION 10 MG/ML
INTRAVENOUS | Status: DC | PRN
Start: 2015-04-06 — End: 2015-04-06
  Administered 2015-04-06: 200 ug/kg/min via INTRAVENOUS

## 2015-04-06 MED ORDER — PROPOFOL 10 MG/ML IV EMUL
INTRAVENOUS | Status: AC
Start: 2015-04-06 — End: ?
  Filled 2015-04-06: qty 40

## 2015-04-06 MED ORDER — PROPOFOL INFUSION 10 MG/ML
INTRAVENOUS | Status: DC | PRN
Start: 2015-04-06 — End: 2015-04-06
  Administered 2015-04-06: 50 mg via INTRAVENOUS
  Administered 2015-04-06: 30 mg via INTRAVENOUS

## 2015-04-06 MED ORDER — LACTATED RINGERS IV SOLN
INTRAVENOUS | Status: DC
Start: 2015-04-06 — End: 2015-04-07
  Administered 2015-04-06: 1000 mL via INTRAVENOUS

## 2015-04-06 MED ORDER — METOPROLOL TARTRATE 1 MG/ML IV SOLN
INTRAVENOUS | Status: AC
Start: 2015-04-06 — End: ?
  Filled 2015-04-06: qty 5

## 2015-04-06 SURGICAL SUPPLY — 50 items
BALLOON CRE DILTR 12-15MMX8CM (Balloon) IMPLANT
BASIN EME PLS 700ML LF GRAD TRNLU PGMNT (Patient Supply) ×1
BASIN EMESIS 700 ML GRADUATED TRANSLUCENT PIGMENT FREE PLASTIC (Patient Supply) ×1 IMPLANT
BASIN EMESIS LF (Patient Supply) ×1
CANNISTER SUCTION 1500CC (Suction)
CATHETER BALLOON DILATATION CRE PEBAX (Balloon) IMPLANT
CATHETER OD6 FR ODSEC12-13.5-15 MM L180 CM CRE BALLOON DILATATION L8 (Balloon) IMPLANT
CATHETER OD6 FR ODSEC12-13.5-15 MM L180 CM CREâ„¢ BALLOON DILATATION L8 (Balloon) IMPLANT
CLIP QUICKCLIP2 GI FIXATN LONG (Clip) IMPLANT
CONTAINER HISTOLOGY 60 ML 30 ML GRADUATE LEAK RESISTANT O RING PREFILL (Procedure Accessories) IMPLANT
FORCEP HOT BIOPSY RJ4 (Endoscopic Supplies)
FORCEPS BIOPSY L240 CM +2.8 MM HOT OD2.2 (Endoscopic Supplies)
FORCEPS BIOPSY L240 CM +2.8 MM HOT OD2.2 MM RADIAL JAW (Endoscopic Supplies) IMPLANT
FORCEPS BIOPSY L240 CM JUMBO MICROMESH (Instrument)
FORCEPS BIOPSY L240 CM JUMBO MICROMESH TEETH STREAMLINE CATHETER (Instrument) IMPLANT
FORCEPS BIOPSY L240 CM LARGE CAPACITY (Instrument)
FORCEPS BIOPSY L240 CM MICROMESH TEETH STREAMLINE CATHETER NEEDLE (Instrument) IMPLANT
FORCEPS BIOPSY L240 CM STANDARD CAPACITY (Instrument)
FORCEPS JAW RADIAL JUMBO (Instrument)
FORCEPS RAD JAW 4 BIOSPY W/NDL (Instrument)
FORCEPS RADIAL JAW 3 W/ NEEDLE (Endoscopic Supplies) IMPLANT
FORCEPS RADIAL JAW 4 2.8MM (Instrument)
GOWN ISO YELLOW UNIVERSAL (Gown) ×4 IMPLANT
KIT SMARTPREP APC 30 30ML (Kits) IMPLANT
LINER SCT 1500CC THNWL MDVC FLX ADV LF (Suction) IMPLANT
MARKER ENDOSCOPIC PERMANENT INDICATION (Syringes, Needles) ×1
MARKER ENDOSCOPIC PERMANENT INDICATION DARK SYRINGE SPOT EX 5 ML (Syringes, Needles) IMPLANT
NEEDLE CARR-LOCKE INJECT 25GX5 (Needles) ×1 IMPLANT
NEEDLE INJC DISP NM LOWER GI (Needles) IMPLANT
NET SPECIMEN RETRIEVAL L230 CM STANDARD (Urology Supply)
NET SPECIMEN RETRIEVAL L230 CM STANDARD SHEATH OD2.5 MM L6 CM X W3 CM (Urology Supply) IMPLANT
PAD ABC GROUNDING BLUE (Laser Supplies) IMPLANT
PAD ELECTROSRG GRND REM W CRD (Procedure Accessories) ×1 IMPLANT
RETRIEVER ROTH NET STD 2.5MM (Urology Supply)
SNARE CAPTIFLEX 27MM (Endoscopic Supplies)
SNARE CAPTIVATOR 13MMX240CM (GE Lab Supplies) ×1
SNARE ESCP MIC CPTVTR 13MM 240IN STRL (GE Lab Supplies) ×1
SNARE MD OVAL 240CM 2.4 MM CPTFLX LP FLXBL ENDOSCOPIC POLYPECTOMY 27MM (Endoscopic Supplies) IMPLANT
SNARE MEDIUM OVAL L240 CM OD2.4 MM (Endoscopic Supplies)
SNARE SMALL HEXAGON CAPTIVATOR STIFF ENDOSCOPIC POLYPECTOMY (GE Lab Supplies) IMPLANT
SOL FORMALIN 10% PREFILL 30ML (Procedure Accessories)
SOLN LUBRICATING JELLY 4.25OZ (Irrigation Solutions) ×2 IMPLANT
SPONGE GAUZE L4 IN X W4 IN 4 PLY HIGH (Sponge) ×1
SPONGE GAUZE L4 IN X W4 IN 4 PLY NONWOVEN LINT FREE CURITY RAYON (Sponge) ×1 IMPLANT
SPONGE GAUZE VERSA 4PLY 4X4 (Sponge) ×1
SYRING SPOT EX ENDO MARK 5CC (Syringes, Needles) ×1
SYRINGE 50 ML GRADUATE NONPYROGENIC DEHP (Syringes, Needles)
SYRINGE 50 ML GRADUATE NONPYROGENIC DEHP FREE PVC FREE BD MEDICAL (Syringes, Needles) IMPLANT
SYRINGE SLIP-TIP 60CC (Syringes, Needles)
TRAP SPECIMEN LF (Procedure Accessories) IMPLANT

## 2015-04-06 NOTE — Anesthesia Postprocedure Evaluation (Signed)
Anesthesia Post Evaluation    Patient: Bethany Howell    Procedure(s) with comments:  COLONOSCOPY - w/ polypectomy    Anesthesia type: general    Last Vitals:   Filed Vitals:    04/06/15 1435   BP: 160/76   Pulse: 86   Temp:    Resp: 18   SpO2: 96%                 Rayburn Ma, 04/06/2015 2:49 PM

## 2015-04-06 NOTE — Progress Notes (Signed)
Returned from colonoscopy this afternoon. Alert and oriented.

## 2015-04-06 NOTE — PACU (Addendum)
Received from OR, awake and alert. Denies any pain or discomfort.    1435 report given to receiving RN Olegario Messier in PCU. Pt is more awake and alert. Passing gas.

## 2015-04-06 NOTE — Transfer of Care (Signed)
Anesthesia Transfer of Care Note    Patient: Bethany Howell    Procedures performed: Procedure(s) with comments:  COLONOSCOPY - w/ polypectomy    Anesthesia type: General TIVA    Patient location:Phase I PACU    Last vitals:   Filed Vitals:    04/06/15 1418   BP: 143/62   Pulse: 116   Temp: 36.2 C (97.1 F)   Resp: 16   SpO2: 97%       Post pain: Patient not complaining of pain, continue current therapy      Mental Status:alert     Respiratory Function: tolerating nasal cannula    Cardiovascular: stable    Nausea/Vomiting: patient not complaining of nausea or vomiting    Hydration Status: adequate    Post assessment: no apparent anesthetic complications

## 2015-04-06 NOTE — Plan of Care (Signed)
Problem: Safety  Goal: Patient will be free from injury during hospitalization  Outcome: Progressing  Intervention: Hourly rounding.  Hourly rounding continued.      Problem: Pain  Goal: Patient's pain/discomfort is manageable  Outcome: Progressing  Intervention: Include patient/family/caregiver in decisions related to pain management  RN assessing patient's pain levels on hourly rounding.      Problem: Psychosocial and Spiritual Needs  Goal: Demonstrates ability to cope with hospitalization/illness  Outcome: Progressing  Intervention: Encourage verbalization of feelings/concerns/expectations  RN offering emotional support and reassurance.      Problem: Moderate/High Fall Risk Score >5  Goal: Patient will remain free of falls  Outcome: Progressing  No falls. No injury.

## 2015-04-06 NOTE — H&P (Signed)
GI PRE PROCEDURE NOTE    Proceduralist Comments:   Review of Systems and Past Medical / Surgical History performed: Yes     Indications:r/o  colon malignancy    Previous Adverse Reaction to Anesthesia or Sedation (if yes, describe): No    Physical Exam / Laboratory Data (If applicable)   Airway Classification: Class II    General: Alert and cooperative  Lungs: Lungs clear to auscultation  Cardiac: RRR, normal S1S2.    Abdomen: Soft, non tender. Normal active bowel sounds  Other:       Recent BMP   Recent Labs      04/06/15   0519   GLUCOSE  115*   BUN  18.4   CREATININE  0.7   CALCIUM  8.7   SODIUM  143   POTASSIUM  3.9   CHLORIDE  104   CO2  27     Recent CMP   Recent Labs      04/06/15   0519   GLUCOSE  115*   BUN  18.4   CREATININE  0.7   SODIUM  143   CO2  27   CALCIUM  8.7       American Society of Anesthesiologists (ASA) Physical Status Classification:   ASA 3 - Patient with moderate systemic disease with functional limitations    Planned Sedation:   Deep sedation with anesthesia    Attestation:   Raynisha Avilla has been reassessed immediately prior to the procedure and is an appropriate candidate for the planned sedation and procedure. Risks, benefits and alternatives to the planned procedure and sedation have been explained to the patient or guardian:  yes        Signed by: Linsey Hirota Billey Chang

## 2015-04-06 NOTE — Plan of Care (Signed)
Problem: Safety  Goal: Patient will be free from injury during hospitalization  Outcome: Progressing  Pt a&o x4, slept during the night with eyes closed. HS meds given per MAR.   No resp distress oR sob. Denies n&v and pain. Voiding yellow urine. ABD soft and non tender. BS +.  Tolerated bowel prep, pt light brown /clear stool noted. VSS V pace on tele.   Safety and fall precautions maintained. Call bell in reach.

## 2015-04-06 NOTE — Progress Notes (Signed)
Infectious Disease            Progress Note    04/06/2015   Bethany Howell ZOX:[id number],XBJ:[id number] is a 79 y.o. female with past medical history significant for hypertension, cardiomyopathy, atrial fibrillation, hyperlipidemia, history of basal cell carcinoma, who is admitted for gram-positive bacteremia and lower extremity weakness.    Subjective:     Bethany Howell today Symptoms:  Stable.  Afebrile, underwent colonoscopy, feeling better. No shortness of breath,cough, chest pain, chest pressure. No dysuria, increase frequency. Other review of system is non contributory.    Objective:     Blood pressure 166/85, pulse 80, temperature 97.3 F (36.3 C), temperature source Temporal Artery, resp. rate 18, height 1.651 m (5\' 5" ), weight 98.4 kg (216 lb 14.9 oz), SpO2 93 %.    General Appearance: No apparent respiratory distress.    HEENT: Pallor positive, anicteric, moist mucous membranes. No thrush.  Neck: Full, range of motion.   Lungs:  Decreased breath sounds at bases, no wheezes and no rales.  Heart: S1 and S2, irregular.    Abdomen: Soft, bowel sounds are positive. No visceromegaly.  Neurological: Awake, responsive, moves all extremities.  Extremities: No rashes.  Psychiatric: Mood and affect is appropriate.    Laboratory And Diagnostic Studies:     Recent Labs      04/06/15   0519  04/04/15   0434   WBC  9.77  9.83   HGB  13.1  10.7*   HEMATOCRIT  41.5  33.8*   PLATELETS  228  221   NEUTROPHILS  82   --    SEGMENTED NEUTROPHILS   --   90     Recent Labs      04/06/15   0519  04/04/15   0434   SODIUM  143  141   POTASSIUM  3.9  3.9   CHLORIDE  104  109   CO2  27  22   BUN  18.4  27.9*   CREATININE  0.7  0.7   GLUCOSE  115*  124*   CALCIUM  8.7  8.2         Current Med's:     Current Facility-Administered Medications   Medication Dose Route Frequency   . albuterol-ipratropium  3 mL Nebulization Q4H WA   . atorvastatin  10 mg Oral Daily   . cefTRIAXone  2 g Intravenous Q24H SCH   . digoxin  125 mcg Oral  Daily   . docusate sodium  100 mg Oral Q12H SCH   . fluticasone-salmeterol  1 puff Inhalation BID   . furosemide  20 mg Oral BID   . guaiFENesin  200 mg Oral QID   . lactobacillus/streptococcus  1 capsule Oral Daily   . losartan  25 mg Oral Daily   . methylPREDNISolone  40 mg Intravenous Q12H SCH   . metoprolol XL  50 mg Oral BID   . Senna  8.6 mg Oral QHS       Assessment:      Condition.  Guarded    Streptococcus gallolyticus bacteremia.   Lower extremity weakness, pelvic pain.   Atrial fibrillation.   Cardiomyopathy.   Hypertension.   Hyperlipidemia.   Pacemaker placement.    Plan:      Continue Rocephin   Continue supportive care   Physical therapy   Discussed with Dr. Marnette Burgess   Discussed with patient's daughter            Alfonzo Beers, M.D.,FACP  04/06/2015  11:06 AM

## 2015-04-06 NOTE — Anesthesia Postprocedure Evaluation (Signed)
Anesthesia Post Evaluation    Patient: Bethany Howell    Procedure(s) with comments:  COLONOSCOPY - w/ polypectomy    Anesthesia type: general    Last Vitals:   Filed Vitals:    04/06/15 1435   BP: 160/76   Pulse: 86   Temp:    Resp: 18   SpO2: 96%       Patient Location: Phase II PACU      Post Pain: Patient not complaining of pain, continue current therapy    Mental Status: awake    Respiratory Function: tolerating room air    Cardiovascular: stable    Nausea/Vomiting: patient not complaining of nausea or vomiting    Hydration Status: adequate    Post Assessment: no apparent anesthetic complications, no reportable events and no evidence of recall    QCDR IS INCOMPLETE           Rayburn Ma, 04/06/2015 2:48 PM

## 2015-04-06 NOTE — Progress Notes (Signed)
[facility]  Progress Note    Patient Info:   Date/Time: 04/06/2015 / 4:26 PM   Patient Name:Bethany Howell   MWU:[id number]    PCP: Christa See, MD   Admit Date:03/31/2015   Attending Physician:Allegra Cerniglia, Drucilla Schmidt, MD      Assessment and Plan:   -Bilateral groin pain lower back pain.  We cannot do MRI.  Patient has a pacemaker, bilateral lower extremity ultrasound negative.  No acute fracture.  D-dimer is negative.  ESR and C-reactive protein slightly elevated, but CT scan with contrast lumbar spine did not show any acute process.   Continue with physical therapy.  Pain Control.  Patient is walking around in the hallway also to the restroom without any major problems and pain  -Bacteremia. Streptococcus gallolyticus sensitivity to follow .  Continue with IV antibiotics.  Colonoscopy done today 15 mm polyp removed.  2 small polyps also noticed that polyp removed, polypectomy hold on anticoagulation.  Monitor complete blood counts risk of bleeding.  Repeat colonoscopy in 3 months.  -History of atrial fibrillation, rate controlled Xarelto on hold for colonoscopy tomorrow  -Acute bronchitis with cough.  Continue Advair.  Continue DuoNeb breathing treatment.  Continue IV antibiotics.   -.  Hypertension, controlled.  Continue Cozaar.  Metoprolol.  -Cardiomyopathy with valvular problems, ejection fraction around 60 percent.  Continue diuretic, beta blocker, Cozaar.  Consulted cardiologist.  Follow-up on recommendation     Hospital Problems:  Principal Problem:    Pelvic pain  Active Problems:    HTN (hypertension)    Cardiomyopathy    Atrial fibrillation    HLD (hyperlipidemia)    Bacteremia     DVT Prohylaxis:lovenox    Code Status: Full Code   Disposition:home   PT/OT/ST/RT/CM/Nutrition input:   Type of Admission:Inpatient   Estimated Length of Stay (including stay in the ER receiving treatment): 1 more day   Medical Necessity for stay:as aove     Subjective:   Chief Complaint:  Chief Complaint   Patient  presents with   . Pelvic Pain   . Leg Swelling      Update on current symptoms:coughing   Review of Systems:Review of Systems   Constitutional: Positive for malaise/fatigue.   HENT: Negative for hearing loss and tinnitus.    Respiratory: Positive for cough. Negative for shortness of breath and wheezing.    Cardiovascular: Negative for chest pain, palpitations, orthopnea, claudication and leg swelling.   Musculoskeletal: Positive for myalgias. Negative for back pain, joint pain, falls and neck pain.   Skin: Negative for itching and rash.   Neurological: Positive for weakness. Negative for headaches.       Allergies:  Allergies   Allergen Reactions   . Codeine       Medications:Meds have been reviewed   Current Facility-Administered Medications   Medication Dose Route Frequency Last Rate Last Dose   . acetaminophen (TYLENOL) tablet 650 mg  650 mg Oral Q4H PRN       . albuterol (PROVENTIL) nebulizer solution 2.5 mg  2.5 mg Nebulization Q6H PRN       . albuterol-ipratropium (DUO-NEB) 2.5-0.5(3) mg/3 mL nebulizer 3 mL  3 mL Nebulization Q4H WA   3 mL at 04/06/15 1545   . atorvastatin (LIPITOR) tablet 10 mg  10 mg Oral Daily   10 mg at 04/06/15 1024   . cefTRIAXone (ROCEPHIN) 2 g in sodium chloride 0.9 % 100 mL IVPB mini-bag plus  2 g Intravenous Q24H SCH 200 mL/hr at 04/06/15 1030  2 g at 04/06/15 1030   . cyclobenzaprine (FLEXERIL) tablet 10 mg  10 mg Oral TID PRN   10 mg at 04/01/15 1016   . digoxin (LANOXIN) tablet 125 mcg  125 mcg Oral Daily   125 mcg at 04/06/15 1025   . docusate sodium (COLACE) capsule 100 mg  100 mg Oral Q12H SCH   100 mg at 04/05/15 1024   . fluticasone-salmeterol (ADVAIR DISKUS) 250-50 MCG/DOSE 1 puff  1 puff Inhalation BID   1 puff at 04/06/15 0836   . furosemide (LASIX) tablet 20 mg  20 mg Oral BID   20 mg at 04/06/15 1025   . guaiFENesin (ROBITUSSIN) oral solution 200 mg  200 mg Oral QID   200 mg at 04/06/15 1222   . hydrALAZINE (APRESOLINE) injection 10 mg  10 mg Intravenous Q4H PRN   10 mg  at 04/05/15 1744   . HYDROcodone-acetaminophen (NORCO) 5-325 MG per tablet 0.5-1 tablet  0.5-1 tablet Oral Q6H PRN   1 tablet at 04/05/15 1024   . lactated ringers infusion   Intravenous Continuous   Stopped at 04/06/15 1413   . lactobacillus/streptococcus (RISAQUAD) capsule 1 capsule  1 capsule Oral Daily   1 capsule at 04/06/15 1026   . losartan (COZAAR) tablet 25 mg  25 mg Oral Daily   25 mg at 04/06/15 1026   . methylPREDNISolone sodium succinate (Solu-MEDROL) injection 40 mg  40 mg Intravenous Q12H SCH   40 mg at 04/06/15 1029   . metoprolol XL (TOPROL-XL) 24 hr tablet 50 mg  50 mg Oral BID   50 mg at 04/06/15 1027   . morphine injection 2 mg  2 mg Intravenous Q4H PRN       . Senna (SENOKOT) tablet 8.6 mg  8.6 mg Oral QHS   8.6 mg at 04/02/15 2256        Objective:   Vitals: Vitals reviewed height is 1.651 m (5\' 5" ) and weight is 97.977 kg (216 lb). Her temporal artery temperature is 97.1 F (36.2 C). Her blood pressure is 160/76 and her pulse is 86. Her respiration is 18 and oxygen saturation is 96%. Body mass index is 35.94 kg/(m^2).  Filed Vitals:    04/06/15 0910 04/06/15 1327 04/06/15 1418 04/06/15 1435   BP: 166/85 170/85 143/62 160/76   Pulse: 80 84 116 86   Temp: 97.3 F (36.3 C)  97.1 F (36.2 C)    TempSrc: Temporal Artery  Temporal Artery    Resp: 18 20 16 18    Height:  1.651 m (5\' 5" )     Weight:  97.977 kg (216 lb)     SpO2: 93% 96% 97% 96%   Intake and Output Summary (Last 24 hours) at Date Time     Intake/Output Summary (Last 24 hours) at 04/06/15 1626  Last data filed at 04/06/15 1413   Gross per 24 hour   Intake    540 ml   Output      0 ml   Net    540 ml      Physical Exam:  Physical Exam   Constitutional: She is oriented to person, place, and time.   Cardiovascular: Exam reveals no gallop and no friction rub.    Murmur heard.  Pulmonary/Chest: No respiratory distress. She has no wheezes. She has no rales. She exhibits no tenderness.   Abdominal: She exhibits no distension and no mass.  There is tenderness. There is no rebound and no guarding.  Musculoskeletal: She exhibits no edema or tenderness.   Neurological: She is alert and oriented to person, place, and time.   Skin: No rash noted. No erythema. No pallor.              Results of Labs/imaging   Labs have been reviewed:   Coagulation Profile:     Recent Labs  Lab 04/06/15  1023  04/01/15  0427   PT 14.8 More results in Results Review 33.3*   PT INR 1.2 More results in Results Review 3.4   PTT  --   --  46*   More results in Results Review = values in this interval not displayed.     CBC review:     Recent Labs  Lab 04/06/15  0519 04/04/15  0434 04/03/15  0512 04/02/15  0549 04/01/15  0427 03/31/15  2143   WBC 9.77 9.83 12.57* 10.89* 6.90 8.23   HGB 13.1 10.7* 10.9* 11.6* 10.6* 11.4*   HEMATOCRIT 41.5 33.8* 33.2* 36.7* 33.5* 35.4*   PLATELETS 228 221 225 205 161 205   MCV 98.1 97.1 97.4 97.9 97.7 97.5   RDW 15 15 15 15 15 15    NEUTROPHILS 82  --   --   --  67 74   SEGMENTED NEUTROPHILS  --  90 70 79  --   --    LYMPHOCYTES AUTOMATED 10  --   --   --  19 12   EOSINOPHILS AUTOMATED 0  --   --   --  1 1   IMMATURE GRANULOCYTE 2  --   --   --  0 0   NEUTROPHILS ABSOLUTE 8.06  --   --   --  4.59 6.08   ABSOLUTE IMMATURE GRANULOCYTE 0.22*  --   --   --  0.01 0.03      Chem Review:    Recent Labs  Lab 04/06/15  0519 04/04/15  0434 04/03/15  0512 04/02/15  0549 04/01/15  0427 03/31/15  2143   SODIUM 143 141 140 141 141 138   POTASSIUM 3.9 3.9 4.1 4.6 3.8 3.7   CHLORIDE 104 109 109 107 107 104   CO2 27 22 22 26 24 24    BUN 18.4 27.9* 28.2* 16.0 10.4 12.8   CREATININE 0.7 0.7 0.7 0.8 0.7 0.8   GLUCOSE 115* 124* 121* 140* 90 112*   CALCIUM 8.7 8.2 8.6 9.2 8.4 9.0   MAGNESIUM  --   --   --   --  2.1  --    BILIRUBIN, TOTAL  --   --  0.4  --  1.1 0.9   AST (SGOT)  --   --  25  --  19 24   ALT  --   --  21  --  12 12   ALKALINE PHOSPHATASE  --   --  86  --  90 104      Results     Procedure Component Value Units Date/Time    Antibiotic Sensitivity Testing  [[id number]] Collected:  03/31/15 2143     Abx Sensitivity Tsting Rslt/Com SEE BELOW Updated:  04/06/15 1545     Organism ID Strep. gallolyt     Narrative:      [id number] called Micro Results of BldCx. Results read back [id number], by [id number]  on 04/01/2015 at 13:28  source = Blood  Organism ID = Streptococcus gallolyticus  test = ABSNS   order code = 6641x    CULTURE  BLOOD AEROBIC AND ANAEROBIC [[id number]] Collected:  04/02/15 1106    Specimen Information:  Blood, Intravenous Line Updated:  04/06/15 1521    Narrative:      ORDER#: [id number]                                    ORDERED BY: MANCHIREDDY, SU  SOURCE: Blood, Intravenous Line LT. A/C              COLLECTED:  04/02/15 11:06  ANTIBIOTICS AT COLL.:                                RECEIVED :  04/02/15 15:15  Culture Blood Aerobic and Anaerobic        PRELIM      04/06/15 15:21  04/03/15   No Growth after 1 day/s of incubation.  04/04/15   No Growth after 2 day/s of incubation.  04/05/15   No Growth after 3 day/s of incubation.  04/06/15   No Growth after 4 day/s of incubation.      Prothrombin time/INR [[id number]] Collected:  04/06/15 1023    Specimen Information:  Blood Updated:  04/06/15 1125     PT 14.8 sec      PT INR 1.2      PT Anticoag. Given Within 48 hrs. None     Cell MorpHology [[id number]] Collected:  04/06/15 0519     Cell Morphology: Normal Updated:  04/06/15 0618     Platelet Estimate Normal     CBC and differential [[id number]]  (Abnormal) Collected:  04/06/15 0519    Specimen Information:  Blood / Blood Updated:  04/06/15 0618     WBC 9.77 x10 3/uL      Hgb 13.1 g/dL      Hematocrit 24.4 %      Platelets 228 x10 3/uL      RBC 4.23 x10 6/uL      MCV 98.1 fL      MCH 31.0 pg      MCHC 31.6 (L) g/dL      RDW 15 %      MPV 10.0 fL      Neutrophils 82 %      Lymphocytes Automated 10 %      Monocytes 8 %      Eosinophils Automated 0 %      Basophils Automated 0 %      Immature Granulocyte 2 %      Neutrophils Absolute 8.06 x10 3/uL      Abs Lymph Automated  0.93 x10 3/uL      Abs Mono Automated 0.76 x10 3/uL      Abs Eos Automated 0.00 x10 3/uL      Absolute Baso Automated 0.02 x10 3/uL      Absolute Immature Granulocyte 0.22 (H) x10 3/uL     Basic Metabolic Panel [[id number]]  (Abnormal) Collected:  04/06/15 0519    Specimen Information:  Blood Updated:  04/06/15 0557     Glucose 115 (H) mg/dL      BUN 64.4 mg/dL      Creatinine 0.7 mg/dL      CALCIUM 8.7 mg/dL      Sodium 034 mEq/L      Potassium 3.9 mEq/L      Chloride 104 mEq/L      CO2 27 mEq/L  Anion Gap 12.0     GFR [[id number]] Collected:  04/06/15 0519     EGFR >60.0 Updated:  04/06/15 0557    CULTURE BLOOD AEROBIC AND ANAEROBIC [[id number]] Collected:  04/02/15 1823    Specimen Information:  Blood, Venipuncture Updated:  04/05/15 2221    Narrative:      ORDER#: [id number]                                    ORDERED BY: Nathaneil Canary  SOURCE: Blood, Venipuncture Venipuncture l AC        COLLECTED:  04/02/15 18:23  ANTIBIOTICS AT COLL.:                                RECEIVED :  04/02/15 22:02  Culture Blood Aerobic and Anaerobic        PRELIM      04/05/15 22:21  04/03/15   No Growth after 1 day/s of incubation.  04/04/15   No Growth after 2 day/s of incubation.  04/05/15   No Growth after 3 day/s of incubation.           Radiology reports have been reviewed:  Radiology Results (24 Hour)     ** No results found for the last 24 hours. **                Hospitalist   Signed by: Zada Finders   04/06/2015 4:26 PM

## 2015-04-06 NOTE — Progress Notes (Signed)
Nutritional Support Services  Nutrition Screening    Bethany Howell 79 y.o. female                                 MRN: [id number]    Referral Source: Per policy  Reason for Referral: LOS    Orders Placed This Encounter   Procedures   . Diet NPO time specified      PO intake: Pt has been npo/clear liquids for the last 2 days but was consuming all her meals prior to that.     Chart review completed.  Labs and meds noted.  No nutrition diagnosis or intervention at this time.  Please consult dietitian if needed.    Nutrition Risk level: Low    Caprice Renshaw, MS, RD  Ext. [phone]

## 2015-04-06 NOTE — Anesthesia Postprocedure Evaluation (Signed)
Anesthesia Post Evaluation    Patient: Bethany Howell    Procedure(s) with comments:  COLONOSCOPY - w/ polypectomy    Anesthesia type: general    Last Vitals:   Filed Vitals:    04/06/15 1327   BP: 170/85   Pulse: 84   Temp:    Resp: 20   SpO2: 96%            Anesthesia Qualified Clinical Data Registry    Central Line      CVC insertion : NO                                               Perioperative temperature management      General/neuraxial anesthesia > or = 60 minutes (excluding CABG) : NO                                Administration of antibiotic prophylaxis      Age > or = 18, with IV access, with surgical procedure for which antibiotic prophylaxis indicated, and not on chronic antibiotics : NO                  Medication Administration      Ordering or administration of drug inconsistent with intended drug, dose, delivery or timing : NO      Dental loss/damage      Dental injury with administration of anesthesia : NO      Difficult intubation due to unrecognized difficult airway        Elective airway procedure including but not limited to: tracheostomy, fiberoptic bronchoscopy, rigid bronchoscopy; jet ventilation; or elective use of a device to facilitate airway management such as a Glidescope : NO                > Unanticipated difficult intubation post pre-evaluation : NO      Aspiration of gastric contents        Aspiration of gastric contents : NO                    Surgical fire        Procedure requiring electrocautery/laser : NO                    Immediate perioperative cardiac arrest        Cardiac arrest in OR or PACU : NO                    Unplanned hospital admission        Unplanned hospital admission for initially intended outpatient anesthesia service : NO      Unplanned ICU admission        Unplanned ICU admission related to anesthesia occurring within 24 hours of induction or start of MAC : NO      Surgical case cancellation        Cancellation of procedure after care already started by  anesthesia care team : NO      Post-anesthesia transfer of care checklist/protocol to PACU        Transfer from OR to PACU upon case conclusion : YES              > Use of PACU transfer checklist/protocol : YES     (Includes  the key elements of: patient identification, responsible practitioner identification (PACU nurse or advanced practitioner), discussion of pertinent history and procedure course, intraoperative anesthetic management, post-procedure plans, acknowledgement/questions)    Post-anesthesia transfer of care checklist/protocol to ICU        Transfer from OR to ICU upon case conclusion : NO                    Post-operative nausea/vomiting risk protocol        Post-operative nausea/vomiting risk protocol : NO  Patient > or = 18 with care initiated by anesthesia team that has a risk factor screen for post-op nausea/vomiting (Includes female, hx PONV, or motion sickness, non-smoker, intended opioid administration for post-op analgesia.)    Anaphylaxis        Anaphylaxis during anesthesia services : NO    (Inclusive of any suspected transfusion reaction in association with blood-bank confirmed blood product incompatibility)              Lajean Silvius, 04/06/2015 2:07 PM

## 2015-04-06 NOTE — Anesthesia Pain Clinic Consult Note (Signed)
===============================================================  Inpatient Anesthesia Evaluation    Patient Name: Bethany Howell,Bethany Howell  Surgeon: Nani Skillern, MD  Patient Age / Sex: 79 y.o. / female    Medical History:     Past Medical History   Diagnosis Date   . Hypertension    . Cardiomyopathy    . Atrial fibrillation      with pacer    . Arthritis    . Hypercholesterolemia    . Basal cell carcinoma        Past Surgical History   Procedure Laterality Date   . Hysterectomy     . Replacement total knee           Allergies:     Allergies   Allergen Reactions   . Codeine          Medications:     Current Facility-Administered Medications   Medication Dose Route Frequency Last Rate Last Dose   . acetaminophen (TYLENOL) tablet 650 mg  650 mg Oral Q4H PRN       . albuterol (PROVENTIL) nebulizer solution 2.5 mg  2.5 mg Nebulization Q6H PRN       . albuterol-ipratropium (DUO-NEB) 2.5-0.5(3) mg/3 mL nebulizer 3 mL  3 mL Nebulization Q4H WA   3 mL at 04/06/15 1149   . atorvastatin (LIPITOR) tablet 10 mg  10 mg Oral Daily   10 mg at 04/06/15 1024   . cefTRIAXone (ROCEPHIN) 2 g in sodium chloride 0.9 % 100 mL IVPB mini-bag plus  2 g Intravenous Q24H SCH 200 mL/hr at 04/06/15 1030 2 g at 04/06/15 1030   . cyclobenzaprine (FLEXERIL) tablet 10 mg  10 mg Oral TID PRN   10 mg at 04/01/15 1016   . digoxin (LANOXIN) tablet 125 mcg  125 mcg Oral Daily   125 mcg at 04/06/15 1025   . docusate sodium (COLACE) capsule 100 mg  100 mg Oral Q12H SCH   100 mg at 04/05/15 1024   . fluticasone-salmeterol (ADVAIR DISKUS) 250-50 MCG/DOSE 1 puff  1 puff Inhalation BID   1 puff at 04/06/15 0836   . furosemide (LASIX) tablet 20 mg  20 mg Oral BID   20 mg at 04/06/15 1025   . guaiFENesin (ROBITUSSIN) oral solution 200 mg  200 mg Oral QID   200 mg at 04/06/15 1222   . hydrALAZINE (APRESOLINE) injection 10 mg  10 mg Intravenous Q4H PRN   10 mg at 04/05/15 1744   . HYDROcodone-acetaminophen (NORCO) 5-325 MG per tablet 0.5-1 tablet  0.5-1 tablet Oral Q6H  PRN   1 tablet at 04/05/15 1024   . lactobacillus/streptococcus (RISAQUAD) capsule 1 capsule  1 capsule Oral Daily   1 capsule at 04/06/15 1026   . losartan (COZAAR) tablet 25 mg  25 mg Oral Daily   25 mg at 04/06/15 1026   . methylPREDNISolone sodium succinate (Solu-MEDROL) injection 40 mg  40 mg Intravenous Q12H SCH   40 mg at 04/06/15 1029   . metoprolol XL (TOPROL-XL) 24 hr tablet 50 mg  50 mg Oral BID   50 mg at 04/06/15 1027   . morphine injection 2 mg  2 mg Intravenous Q4H PRN       . Senna (SENOKOT) tablet 8.6 mg  8.6 mg Oral QHS   8.6 mg at 04/02/15 2256              Prior to Admission medications    Medication Sig Start Date End Date Taking? Authorizing Provider   albuterol (  PROVENTIL HFA;VENTOLIN HFA) 108 (90 BASE) MCG/ACT inhaler Inhale 2 puffs into the lungs.   Yes [provider]   atorvastatin (LIPITOR) 10 MG tablet Take 10 mg by mouth daily.   Yes [provider]   Biotin 1000 MCG tablet Take 1,000 mcg by mouth 3 (three) times daily.   Yes [provider]   Cholecalciferol (VITAMIN D-3 PO) Take by mouth.   Yes [provider]   digoxin (LANOXIN) 0.125 MG tablet Take 125 mcg by mouth.   Yes [provider]   furosemide (LASIX) 20 MG tablet Take 20 mg by mouth 2 (two) times daily.   Yes [provider]   HYDROcodone-acetaminophen (NORCO) 5-325 MG per tablet Take 0.5-1 tablets by mouth every 6 (six) hours as needed. 03/27/15  Yes Ulla Gallo, PA   losartan (COZAAR) 25 MG tablet Take 25 mg by mouth daily.   Yes [provider]   metoprolol XL (TOPROL-XL) 50 MG 24 hr tablet Take 50 mg by mouth 2 (two) times daily.   Yes [provider]   Mometasone Furo-Formoterol Fum (DULERA IN) Inhale into the lungs.   Yes [provider]   Potassium (POTASSIMIN PO) Take by mouth.   Yes [provider]   rivaroxaban (XARELTO) 20 MG Tab Take 20 mg by mouth daily with dinner.   Yes [provider]   traMADol (ULTRAM) 50  MG tablet Take 50 mg by mouth every 6 (six) hours as needed for Pain.    [provider]     Vitals   Temp:  [36.2 C (97.2 F)-36.6 C (97.9 F)] 36.3 C (97.3 F)  Heart Rate:  [69-121] 80  Resp Rate:  [16-18] 18  BP: (150-186)/(80-103) 166/85 mmHg    Wt Readings from Last 3 Encounters:   04/06/15 98.4 kg (216 lb 14.9 oz)   03/27/15 97.07 kg (214 lb)     BMI (Estimated body mass index is 36.1 kg/(m^2) as calculated from the following:    Height as of this encounter: 1.651 m (5\' 5" ).    Weight as of this encounter: 98.4 kg (216 lb 14.9 oz).)  Temp Readings from Last 3 Encounters:   04/06/15 36.3 C (97.3 F) Temporal Artery   03/27/15 37.7 C (99.8 F)      BP Readings from Last 3 Encounters:   04/06/15 166/85   03/27/15 126/93     Pulse Readings from Last 3 Encounters:   04/06/15 80   03/27/15 81           Labs:   CBC:  Lab Results   Component Value Date    WBC 9.77 04/06/2015    HGB 13.1 04/06/2015    HCT 41.5 04/06/2015    PLT 228 04/06/2015       Chemistries:  Lab Results   Component Value Date    NA 143 04/06/2015    K 3.9 04/06/2015    CL 104 04/06/2015    CO2 27 04/06/2015    BUN 18.4 04/06/2015    CREAT 0.7 04/06/2015    GLU 115* 04/06/2015    CA 8.7 04/06/2015    AST 25 04/03/2015       Coags:  Lab Results   Component Value Date    PT 14.8 04/06/2015    PTT 46* 04/01/2015    INR 1.2 04/06/2015     _____________________      Signed by: Kipp Brood Chrystina Naff  04/06/2015   12:56 PM    =============================================================

## 2015-04-07 LAB — CELL MORPHOLOGY
Cell Morphology: ABNORMAL — AB
Platelet Estimate: NORMAL

## 2015-04-07 LAB — MAN DIFF ONLY
Band Neutrophils Absolute: 0.89 10*3/uL (ref 0.00–1.00)
Band Neutrophils: 9 %
Basophils Absolute Manual: 0 10*3/uL (ref 0.00–0.20)
Basophils Manual: 0 %
Eosinophils Absolute Manual: 0 10*3/uL (ref 0.00–0.70)
Eosinophils Manual: 0 %
Lymphocytes Absolute Manual: 0.89 10*3/uL (ref 0.50–4.40)
Lymphocytes Manual: 9 %
Monocytes Absolute: 0.2 10*3/uL (ref 0.00–1.20)
Monocytes Manual: 2 %
Neutrophils Absolute Manual: 7.89 10*3/uL (ref 1.80–8.10)
Nucleated RBC: 0 /100 WBC (ref 0–1)
Segmented Neutrophils: 80 %

## 2015-04-07 LAB — CBC AND DIFFERENTIAL
Hematocrit: 36.7 % — ABNORMAL LOW (ref 37.0–47.0)
Hgb: 11.6 g/dL — ABNORMAL LOW (ref 12.0–16.0)
MCH: 30.5 pg (ref 28.0–32.0)
MCHC: 31.6 g/dL — ABNORMAL LOW (ref 32.0–36.0)
MCV: 96.6 fL (ref 80.0–100.0)
MPV: 9.7 fL (ref 9.4–12.3)
Platelets: 219 10*3/uL (ref 140–400)
RBC: 3.8 10*6/uL — ABNORMAL LOW (ref 4.20–5.40)
RDW: 15 % (ref 12–15)
WBC: 9.86 10*3/uL (ref 3.50–10.80)

## 2015-04-07 MED ORDER — CYCLOBENZAPRINE HCL 10 MG PO TABS
10.0000 mg | ORAL_TABLET | Freq: Three times a day (TID) | ORAL | Status: DC | PRN
Start: 2015-04-07 — End: 2016-03-07

## 2015-04-07 MED ORDER — HYDROCODONE-ACETAMINOPHEN 5-325 MG PO TABS
0.5000 | ORAL_TABLET | Freq: Four times a day (QID) | ORAL | Status: DC | PRN
Start: 2015-04-07 — End: 2015-04-24

## 2015-04-07 MED ORDER — AMOXICILLIN-POT CLAVULANATE 875-125 MG PO TABS
1.0000 | ORAL_TABLET | Freq: Two times a day (BID) | ORAL | Status: AC
Start: 2015-04-07 — End: 2015-04-19

## 2015-04-07 MED ORDER — GUAIFENESIN 100 MG/5ML PO SOLN
200.0000 mg | Freq: Four times a day (QID) | ORAL | Status: DC
Start: 2015-04-07 — End: 2016-03-07

## 2015-04-07 NOTE — Progress Notes (Signed)
Pt discharged via wheelchair with daughter to home. Discharge instructions and meds informations discussed and understood. Pt will f/u with PCP and specialists as directed.

## 2015-04-07 NOTE — Discharge Summary (Signed)
[facility] HOSPITALIST   [place] Summary     Patient Info:   Date Time: 04/07/2015  2:47 PM   Patient Name:Bethany Howell   ZOX:[id number]    PCP: Christa See, MD   Admit Date:03/31/2015   Attending Physician:Kalilah Barua, Drucilla Schmidt, MD      Hospital Course:   Brief history ad admission and Hospital course:  Patient is 79 year old female came in with abdominal pain groin pain bilateral thighs pain.  Diagnosed with bacteremia, most probably was responsible for her week symptoms.  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  Colonoscopy done 3 polyps removed 1 was 15 mm patient will follow up with gastroenterologist in 1 week to discussed results.  Follow up with infectious disease specialist in 1 week.  I will discharge her on Augmentin for 12 more days for infectious disease specialist recommendation.  She does have history of atrial fibrillation with mitral and tricuspid regurgitation, preserved ejection fraction.  She will follow-up with cardiologist.  Continue anticoagulation.  I will recommend to start after 2 days.  She had biopsies done.  Risk of gastrointestinal bleed.  Discharge diagnosis:  Please see H&P for complete details of HPI and ROS. The patient was admitted to [facility] and has been diagnosed with the following conditions and has been taken care as mentioned below.    -Bilateral groin pain lower back pain. We cannot do MRI. Patient has a pacemaker, bilateral lower extremity ultrasound negative. No acute fracture. D-dimer is negative. ESR and C-reactive protein slightly elevated, but CT scan with contrast lumbar spine did not show any acute process.  Chronic degenerative arthritic changes    -Bacteremia. Streptococcus gallolyticus.  -History of atrial fibrillation, rate controlled Xarelto on hold for 2 more days.  On EVS it shows Continue, but we will educate patient  -Acute bronchitis with cough. Continue Advair. Continue DuoNeb breathing treatment.d/c  solumedrol  -. Hypertension, controlled. Continue Cozaar. Metoprolol.  -Cardiomyopathy with valvular problems, ejection fraction around 60 percent. Continue diuretic, beta blocker, Cozaar.     Hospital Problems:  Principal Problem:    Pelvic pain  Active Problems:    HTN (hypertension)    Cardiomyopathy    Atrial fibrillation    HLD (hyperlipidemia)    Bacteremia     Admission Date:03/31/2015   Discharge Date:04/07/2015    Disposition: home   Condition at Discharge and Prognosis: stable   Type of Admission: Inpatient   Medical Necessity for stay:as above   Code Status: Full Code       Clinical Presentation:      Chief Complaint:   Chief Complaint   Patient presents with   . Pelvic Pain   . Leg Swelling      Vitals: Vitals reviewed height is 1.651 m (5\' 5" ) and weight is 97.977 kg (216 lb). Her temporal artery temperature is 97.3 F (36.3 C). Her blood pressure is 158/74 and her pulse is 70. Her respiration is 16 and oxygen saturation is 94%. Body mass index is 35.94 kg/(m^2).  Filed Vitals:    04/07/15 0523 04/07/15 0728 04/07/15 0904 04/07/15 1338   BP: 157/80  157/75 158/74   Pulse: 90 99 126 70   Temp: 97 F (36.1 C)  97.7 F (36.5 C) 97.3 F (36.3 C)   TempSrc: Temporal Artery  Temporal Artery Temporal Artery   Resp: 18 18 16 16    Height:       Weight:       SpO2: 95%  95% 94%  Intake and Output Summary (Last 24 hours) at Date Time   Intake/Output Summary (Last 24 hours) at 04/07/15 1447  Last data filed at 04/07/15 0830   Gross per 24 hour   Intake    300 ml   Output      0 ml   Net    300 ml      Physical Exam:  Physical Exam   Cardiovascular: Exam reveals no friction rub.    No murmur heard.  Pulmonary/Chest: No respiratory distress. She has no wheezes. She has no rales. She exhibits no tenderness.   Abdominal: She exhibits no distension and no mass. There is no tenderness. There is no rebound and no guarding. No hernia.   Musculoskeletal: She exhibits no edema or tenderness.              Discharge  Diagnosis and Instructions:   Pending Labs:  Unresulted Labs     None         Consultants:[facility]ARM[facility] TO DOSE MEDICATION  [facility] FACE-TO-FACE (FTF) ENCOUNTER  IP CONSULT TO CARDIOLOGY   Discharge Medications:     Discharge Medication List      Taking          albuterol 108 (90 BASE) MCG/ACT inhaler   Dose:  2 puff   Commonly known as:  PROVENTIL HFA;VENTOLIN HFA   Inhale 2 puffs into the lungs.       amoxicillin-clavulanate 875-125 MG per tablet   Dose:  1 tablet   Commonly known as:  AUGMENTIN   Take 1 tablet by mouth 2 (two) times daily.       atorvastatin 10 MG tablet   Dose:  10 mg   Commonly known as:  LIPITOR   Take 10 mg by mouth daily.       Biotin 1000 MCG tablet   Dose:  1000 mcg   Take 1,000 mcg by mouth 3 (three) times daily.       cyclobenzaprine 10 MG tablet   Dose:  10 mg   Commonly known as:  FLEXERIL   Take 1 tablet (10 mg total) by mouth 3 (three) times daily as needed for Muscle spasms.       digoxin 0.125 MG tablet   Dose:  125 mcg   Commonly known as:  LANOXIN   Take 125 mcg by mouth.       DULERA IN   Inhale into the lungs.       furosemide 20 MG tablet   Dose:  20 mg   Commonly known as:  LASIX   Take 20 mg by mouth 2 (two) times daily.       guaiFENesin 100 MG/5ML Soln   Dose:  200 mg   Commonly known as:  ROBITUSSIN   Take 10 mLs (200 mg total) by mouth 4 (four) times daily.       HYDROcodone-acetaminophen 5-325 MG per tablet   Dose:  0.5-1 tablet   Commonly known as:  NORCO   Take 0.5-1 tablets by mouth every 6 (six) hours as needed.       losartan 25 MG tablet   Dose:  25 mg   Commonly known as:  COZAAR   Take 25 mg by mouth daily.       metoprolol XL 50 MG 24 hr tablet   Dose:  50 mg   Commonly known as:  TOPROL-XL   Take 50 mg by mouth 2 (two) times daily.  POTASSIMIN PO   Take by mouth.       rivaroxaban 20 MG Tabs   Dose:  20 mg   Commonly known as:  XARELTO   Take 20 mg by mouth daily with dinner.       VITAMIN D-3 PO   Take by mouth.         STOP taking these  medications          traMADol 50 MG tablet   Commonly known as:  ULTRAM            Labs/Images to be followed at your PCP office:follow on biopsy results   Hospital Problems:Principal Problem:    Pelvic pain  Active Problems:    HTN (hypertension)    Cardiomyopathy    Atrial fibrillation    HLD (hyperlipidemia)    Bacteremia     Lists the present on admission hospital problems:Present on Admission:   . Pelvic pain  . HTN (hypertension)  . Cardiomyopathy  . Atrial fibrillation  . HLD (hyperlipidemia)  . Bacteremia   Follow up:         Follow-up Information     [facility] Go on 04/10/2015.    Why:  Follow Up, Primary Care Tuesday, May 3 at 1030    Contact information:    [address] [place]  [address]  [place] [address]  [phone]        Follow up with Alfonzo Beers, MD. Schedule an appointment as soon as possible for a visit in 1 week.    Specialties:  Infectious Disease, Internal Medicine    Contact information:    [address] [facility]  [address]  [place] [address]  [phone]          Follow up with Nani Skillern, MD. Schedule an appointment as soon as possible for a visit in 1 week.    Specialty:  Gastroenterology    Contact information:    [address]  [address] [address]  [phone]          Follow up with Amedeo Gory, MD. Schedule an appointment as soon as possible for a visit in 4 weeks.    Specialties:  Cardiology, Internal Medicine    Contact information:    [address]  [address]  [place] [address]  [phone]          Follow up with Pcp, Noneorunknown, MD .              Results of Labs/imaging:   Labs have been reviewed:   Coagulation Profile:   Recent Labs  Lab 04/06/15  1023  04/01/15  0427   PT 14.8 More results in Results Review 33.3*   PT INR 1.2 More results in Results Review 3.4   PTT  --   --  46*   More results in Results Review = values in this interval not displayed.     CBC review:   Recent Labs  Lab 04/07/15  0503  04/06/15  0519 04/04/15  0434 04/03/15  0512 04/02/15  0549 04/01/15  0427 03/31/15  2143   WBC 9.86 9.77 9.83 12.57* 10.89* 6.90 8.23   HGB 11.6* 13.1 10.7* 10.9* 11.6* 10.6* 11.4*   HEMATOCRIT 36.7* 41.5 33.8* 33.2* 36.7* 33.5* 35.4*   PLATELETS 219 228 221 225 205 161 205   MCV 96.6 98.1 97.1 97.4 97.9 97.7 97.5   RDW 15 15 15  15  15 15 15    NEUTROPHILS  --  82  --   --   --  67 74   SEGMENTED NEUTROPHILS 80  --  90 70 79  --   --    LYMPHOCYTES AUTOMATED  --  10  --   --   --  19 12   EOSINOPHILS AUTOMATED  --  0  --   --   --  1 1   IMMATURE GRANULOCYTE  --  2  --   --   --  0 0   NEUTROPHILS ABSOLUTE  --  8.06  --   --   --  4.59 6.08   ABSOLUTE IMMATURE GRANULOCYTE  --  0.22*  --   --   --  0.01 0.03      Chem Review:  Recent Labs  Lab 04/06/15  0519 04/04/15  0434 04/03/15  0512 04/02/15  0549 04/01/15  0427 03/31/15  2143   SODIUM 143 141 140 141 141 138   POTASSIUM 3.9 3.9 4.1 4.6 3.8 3.7   CHLORIDE 104 109 109 107 107 104   CO2 27 22 22 26 24 24    BUN 18.4 27.9* 28.2* 16.0 10.4 12.8   CREATININE 0.7 0.7 0.7 0.8 0.7 0.8   GLUCOSE 115* 124* 121* 140* 90 112*   CALCIUM 8.7 8.2 8.6 9.2 8.4 9.0   MAGNESIUM  --   --   --   --  2.1  --    BILIRUBIN, TOTAL  --   --  0.4  --  1.1 0.9   AST (SGOT)  --   --  25  --  19 24   ALT  --   --  21  --  12 12   ALKALINE PHOSPHATASE  --   --  86  --  90 104      Results     Procedure Component Value Units Date/Time    Blood Culture Aerobic/Anaerobic #2 [[id number]] Collected:  03/31/15 2143    Specimen Information:  Arm / Blood Updated:  04/07/15 1220    Narrative:      [id number] called Micro Results of BldCx. Results read back [id number], by [id number] on  04/01/2015 at 14:05  ORDER#: [id number]                                    ORDERED BY: Marlane Hatcher  SOURCE: Blood RAC                                    COLLECTED:  03/31/15 21:43  ANTIBIOTICS AT COLL.:                                RECEIVED :  04/01/15 00:30  [id number] called Micro Results of BldCx. Results read back [id number], by  [id number] on 04/01/2015 at 14:05  Culture Blood Aerobic and Anaerobic        FINAL       04/07/15 12:20   +  04/01/15   Aerobic and Anaerobic Blood Culture Positive in less than 24 hrs             Gram Stain Shows: Gram positive cocci in pairs             Resembling  Streptococcus species  04/02/15   Culture Positive for Streptococcus species               Refer to identification on culture #[id number]             Other isolate [facility] for sensitivity        Blood Culture Aerobic/Anaerobic #1 [[id number]] Collected:  03/31/15 2143    Specimen Information:  Arm / Blood Updated:  04/07/15 1219    Narrative:      [id number] called Micro Results of BldCx. Results read back [id number], by [id number]  on 04/01/2015 at 13:28  source = Blood  Organism ID = Streptococcus gallolyticus  test = ABSNS   order code = 6641x  ORDER#: [id number]                                    ORDERED BY: Marlane Hatcher  SOURCE: Blood LAC                                    COLLECTED:  03/31/15 21:43  ANTIBIOTICS AT COLL.:                                RECEIVED :  04/01/15 00:30  [id number] called Micro Results of BldCx. Results read back [id number], by [id number] on 04/01/2015 at 13:28  Culture Blood Aerobic and Anaerobic        FINAL       04/07/15 12:19   +  04/01/15   Aerobic and Anaerobic Blood Culture Positive in less than 24 hrs             Gram Stain Shows: Gram positive cocci in pairs             Resembling Streptococcus species  04/02/15   Growth of Streptococcus gallolyticus               See seperate report from reference laboratory.        CBC and differential [[id number]]  (Abnormal) Collected:  04/07/15 0503    Specimen Information:  Blood / Blood Updated:  04/07/15 0557     WBC 9.86 x10 3/uL      Hgb 11.6 (L) g/dL      Hematocrit 42.5 (L) %      Platelets 219 x10 3/uL      RBC 3.80 (L) x10 6/uL      MCV 96.6 fL      MCH 30.5 pg      MCHC 31.6 (L) g/dL      RDW 15 %      MPV 9.7 fL     Manual Differential [[id number]] Collected:  04/07/15 0503      Segmented Neutrophils 80 % Updated:  04/07/15 0557     Band Neutrophils 9 %      Lymphocytes Manual 9 %      Monocytes Manual 2 %      Eosinophils Manual 0 %      Basophils Manual 0 %      Nucleated RBC 0 /100 WBC      Abs Seg Manual 7.89 x10 3/uL      Bands Absolute 0.89 x10 3/uL      Absolute Lymph Manual 0.89 x10 3/uL  Monocytes Absolute 0.20 x10 3/uL      Absolute Eos Manual 0.00 x10 3/uL      Absolute Baso Manual 0.00 x10 3/uL     Cell MorpHology [[id number]]  (Abnormal) Collected:  04/07/15 0503     Cell Morphology: Abnormal (A) Updated:  04/07/15 0557     Ovalocytes =1+ (A)      Platelet Estimate Normal     CULTURE BLOOD AEROBIC AND ANAEROBIC [[id number]] Collected:  04/02/15 1823    Specimen Information:  Blood, Venipuncture Updated:  04/06/15 2221    Narrative:      ORDER#: [id number]                                    ORDERED BY: Nathaneil Canary  SOURCE: Blood, Venipuncture Venipuncture l AC        COLLECTED:  04/02/15 18:23  ANTIBIOTICS AT COLL.:                                RECEIVED :  04/02/15 22:02  Culture Blood Aerobic and Anaerobic        PRELIM      04/06/15 22:21  04/03/15   No Growth after 1 day/s of incubation.  04/04/15   No Growth after 2 day/s of incubation.  04/05/15   No Growth after 3 day/s of incubation.  04/06/15   No Growth after 4 day/s of incubation.      Antibiotic Sensitivity Testing [[id number]] Collected:  03/31/15 2143     Abx Sensitivity Tsting Rslt/Com SEE BELOW Updated:  04/06/15 1545     Organism ID Strep. gallolyt     Narrative:      [id number] called Micro Results of BldCx. Results read back [id number], by [id number]  on 04/01/2015 at 13:28  source = Blood  Organism ID = Streptococcus gallolyticus  test = ABSNS   order code = 6641x    CULTURE BLOOD AEROBIC AND ANAEROBIC [[id number]] Collected:  04/02/15 1106    Specimen Information:  Blood, Intravenous Line Updated:  04/06/15 1521    Narrative:      ORDER#: [id number]                                    ORDERED BY: MANCHIREDDY,  SU  SOURCE: Blood, Intravenous Line LT. A/C              COLLECTED:  04/02/15 11:06  ANTIBIOTICS AT COLL.:                                RECEIVED :  04/02/15 15:15  Culture Blood Aerobic and Anaerobic        PRELIM      04/06/15 15:21  04/03/15   No Growth after 1 day/s of incubation.  04/04/15   No Growth after 2 day/s of incubation.  04/05/15   No Growth after 3 day/s of incubation.  04/06/15   No Growth after 4 day/s of incubation.           Radiology reports have been reviewed:  Radiology Results (24 Hour)     ** No results found for the last 24 hours. **        Echocardiogram Adult Complete W Clr/  Dopp Waveform    04/03/2015   ECHOCARDIOGRAM  Sonographer:  Steele Berg Technical Quality: good Indications:  Bacteremia  Height (in):  65 Weight (lb):  212 Blood Pressure:  146/90    2-D Measurements  Left Ventricle                                           Diastolic Dimension:  41  (40-56 mm) Systolic Dimension:  24  (25-40 mm)      Septal Diastolic Thickness:  13  (6-11 mm)     Posterior Wall Thickness:  12  (6-11 mm)  Fractional Shortening Percentage:  30  (24-46 %)                        Visually Estimated Ejection Fraction:  60  (55-75 %)                           Right Ventricle Diastolic Dimension:  37  (7-26 mm)                             Left Atrium End Systolic Dimension:  46  (19-40 mm)                      Aortic Root:  28  (20-37 mm)                         Doppler Measurements and Color Flow Imaging Valves                                          Aortic Valve:  1.4  (0.9-1.8 m/s).          Regurgitation:  None  Pulmonic Valve:  1.2  (0.6-0.9 m/s).      Regurgitation:  Trace  Mitral Valve:  1.1  (0.6-1.4 m/s).           Regurgitation:  Moderate  Tricuspid Valve:  0.8  (0.4-0.8 m/s.      Regurgitation:  Moderate to severe  Left Ventricular Outflow Tract Velocity:  0.9 m/s.   E/A Ratio:      Est. PASP:  32 mmHg Est. RA Mean Pressure:  5 mmHg     04/03/2015     1.   The quality of the study is technically  adequate for interpretation. 2.   The left ventricle is normal in size. Left ventricular systolic function is normal. There are no regional wall motion abnormalities. Estimated EF is 60%.        There is mild left ventricular hypertrophy. T  3.  The left atrium is and large in size. 4.  The aortic valve is mildly sclerotic. Normal valve excursion is present. There is no aortic insufficiency. No aortic stenosis is present. The aortic root is     in size. 5.   The mitral valve is structurally normal. Moderate mitral regurgitation  is present. 6.   The right ventricle is enlarged in size and function. 7.   The right atrium is enlarged in size. 8.   The tricuspid valve is structurally normal. There is moderate to severe tricuspid  insufficiency. 9.   The pulmonic valve is structurally normal. There is trace of pulmonic insufficiency. 10. The RV systolic pressure is estimated at 32 mmHg, indicating no pulmonary hypertension.  11. Nopericardial effusion, intracardiac thrombi, or masses are seen. 12. The atrial septum is structurally intact. No shunt by color flow doppler.   CONCLUSION:  Borderline LVH with normal LV wall motion. EF 60%  Moderate mitral regurgitation. Moderate to severe tricuspid regurgitation. Normal PA systolic pressure. Dilated left and right atrium and right ventricle No valvular regurgitation  Kristeen Mans, MD  04/03/2015 1:13 PM     Xr Lumbar Spine Ap And Lateral    04/01/2015   Clinical History: Radiating posterior thigh and calf pain.  Findings: AP, lateral, and cone-down lateral views of the lumbar spine.   Disc space narrowing and osteophytes from L2-3 through L5-S1. Mild endplate sclerosis and vacuum disc at L5-S1. Slight retrolisthesis at L2-3 and L3-4. Portions of the spine are partially obscured by overlying oral contrast in portions of the colon. Bone mineralization may be decreased. No fracture is identified.     04/01/2015   Impression: Multilevel degenerative changes.  Darra Lis, MD  04/01/2015 2:50 PM     Hip Left Ap And Lateral    03/31/2015   History: Hip pain.  FINDINGS: AP and frog-leg views were obtained of the left hip and pelvis.  There is minimal degenerative spur formation along the posterior acetabula bilaterally. There is no evidence of a fracture or a focal bone lesion.     03/31/2015    1. Minimal degenerative changes in both hips. 2. No fracture is seen.  Lanier Ensign, MD  03/31/2015 10:58 PM     Hip Right Ap And Lateral    03/31/2015   History: Pain.  FINDINGS: AP and frog-leg views were obtained of the right hip and pelvis.  There is no evidence of a fracture or a focal bone lesion. There is mild spur formation along the posterior acetabula bilaterally. The sacral iliac joints show minimal degenerative change.     03/31/2015    Minimal degenerative changes in the hips and sacroiliac joints.  Lanier Ensign, MD  03/31/2015 10:57 PM     Ct Lumbar Spine W Contrast    04/02/2015   History: Bacteremia, lower extremity weakness.  Findings: Contiguous axial, reformatted coronal and sagittal images through the lumbar spine following administration of 100 cc of Visipaque 320. Correlation with radiographs dated April 01, 2015.  The lumbar vertebral body alignment and vertebral body heights are normally preserved. No acute fractures are seen.  At the L2-L3 level there is shallow broad-based posterior disc bulging, mild spinal canal and neural foraminal narrowing.  At the L3-L4 level there is degenerative, slightly hypertrophic facet disease. There is shallow posterior disc bulging, mild to moderate spinal canal narrowing, mild neural foraminal narrowing.  At the L4-L5 level there is a vacuum disc phenomenon, facet degeneration, shallow broad-based posterior disc bulging. There is mild spinal canal narrowing, mild to moderate neural foraminal narrowing.  At the L5-S1 level there is disc degeneration, disc height narrowing, shallow broad-based posterior disc bulging, endplate foraminal  osteophyte formation, moderate narrowing of the neural foramina.  No definite paraspinal edema or fluid collections are identified. No destructive bony changes are apparent.     04/02/2015   Impression: Diffuse degenerative changes. Please see the discussion above.  Terrilee Croak, MD  04/02/2015 10:14 PM     Ct Boney Pelvis  04/01/2015   TECHNIQUE: CT pelvis WITHOUT contrast. Sagittal and coronal reconstructed images were obtained.  INDICATION: Bilateral hip pain.  COMPARISON: Hip radiographs dated 03/31/2015.   FINDINGS:   No acute fracture or dislocation.  Small osteophytes are seen at both hips with no significant joint space narrowing consistent with minimal degenerative changes. Mild degenerative changes of the bilateral sacroiliac joints. Mild degenerative changes of the lumbar spine.     04/01/2015    No acute abnormality.  Johnsie Kindred, MD  04/01/2015 1:29 AM     Xr Chest  Ap Portable    03/31/2015   History: Cough and shortness of breath.  FINDINGS: An AP portable upright view was obtained.  The lungs are clear with no evidence of infiltrates, edema, or fibrosis. There is no evidence of a pleural effusion or pneumothorax. Cardiomegaly is noted. A pacemaker is in place. There are degenerative changes in the thoracic spine.     03/31/2015    No active disease in the chest.  Lanier Ensign, MD  03/31/2015 10:26 PM     [place] Venodopp Low Extremity Bilateral    03/31/2015   History: Bilateral leg pain.  FINDINGS: Color flow and spectral doppler evaluation of the deep venous system of both lower extremities was performed. On each side the common femoral vein, superficial femoral vein, and popliteal vein were visualized and appear unremarkable.  These veins are compressible with no evidence of thrombus.  Doppler demonstrates patency and normal directional and phasic flow.  Appropriate augmentation was observed.  The proximal deep femoral vein is visualized and appears unremarkable.  The portions of the posterior  tibial and peroneal veins that were seen appear unremarkable with no thrombus noted.  There is no indirect evidence of a calf vein thrombus.     03/31/2015    No evidence of deep vein thrombosis involving either lower extremity.  Lanier Ensign, MD  03/31/2015 10:25 PM     Ct Abd/pelvis With Iv And Po Contrast    03/27/2015   CLINICAL INFORMATION: 79 year old. Groin pain for 7 days. Right lower quadrant pain for 3 days.  TECHNIQUE AND FINDINGS: CT of the abdomen and pelvis with oral and intravenous contrast. No comparisons.  Intestinal gas pattern within normal limits with no evidence of obstruction. No right lower quadrant inflammatory changes. No inguinal or abdominal wall hernia. Tiny fat-containing umbilical hernia. No inguinal adenopathy  No abnormal pelvic mass or fluid collection. No pelvic adenopathy. Bladder appears normal. No significant renal abnormality. Several small renal cysts. No perirenal abnormality. Unremarkable adrenal glands.  Normal size liver with no focal abnormality. Mild periportal edema and prominent hepatic IVC and hepatic veins suggest elevated right heart pressure. No calcified gallstones or biliary dilatation. Unremarkable spleen and pancreas. No retroperitoneal adenopathy. No significant abnormality of the aorta.  Lower chest images demonstrate a small hiatus hernia. The heart is moderately to markedly enlarged with enlargement of the right atrium and right ventricle. Intact pacemaker. No pericardial or pleural effusion. No acute findings at the lung bases.     03/27/2015    1. No acute or significant abnormality identified to explain either the patient's right lower quadrant pain groin pain. 2. No acute abdominal findings. 3. Moderate to marked cardiac enlargement with enlargement of the right atrium and right ventricle and distended hepatic IVC and hepatic veins consistent with elevated right heart pressure.   Annabell Sabal, MD  03/27/2015 10:10 PM      Pathology:   Specimens  None             Hospitalist:   Signed by: Zada Finders   04/07/2015 2:47 PM   Time spent for discharge: 35 minutes

## 2015-04-07 NOTE — Plan of Care (Signed)
Problem: Safety  Goal: Patient will be free from injury during hospitalization  Outcome: Progressing  .  Intervention: Assess patient's risk for falls and implement fall prevention plan of care per policy  .  Intervention: Provide and maintain safe environment  .  Intervention: Use appropriate transfer methods  .  Intervention: Ensure appropriate safety devices are available at the bedside  .  Intervention: Include patient/family/caregiver in decisions related to safety  .  Intervention: Hourly rounding.  .      Comments:   Pt is alert and oriented. Daughter in law at the bed side.denies any pain or discomfort at this time.will monitor her conditions and report any changes to attending.

## 2015-04-07 NOTE — Discharge Instructions (Signed)
Home Health Discharge Information    Your doctor has ordered Skilled Nursing and Physical Therapy in-home service(s) for you while you recuperate at home, to assist you in the transition from hospital to home.      The agency that you or your representative chose to provide the service:   ]      [employer]:  Name of DME Company: [employer]-[phone] (Rollator ordered and to be delivered to home)]  Equipment Ordered: Rollator      The above services were set up by:  Julien Girt [facility]iaison) Phone [phone]     Additional comments:     Medication:            Amoxicillin/Clavulanic acid(Augmentin)   This Medication is used for:   Bacterial Infections    Common Side Effects are:   Nausea   Upset Stomach   Diarrhea    A note from your nurse:  Call your nurse immediately if you notice itching, hives, swelling or trouble breathing     Medication Name: Cyclobenzaprine (Flexeril)   This Medication is used for:   Muscular pain     Common Side Effects are:   Dizziness   Dry mouth   Drowsiness     A note from your nurse:  Call your nurse immediately if you notice itching, hives, swelling or trouble breathing       Medication: Guaifenesin(Robitussin, Mucinex)   This Medication is used for:   Chest Congestion    Common Side Effects are:   Drowsiness   Nausea    A note from your nurse:  Call your nurse immediately if you notice itching, hives, swelling or trouble breathing

## 2015-04-07 NOTE — Plan of Care (Signed)
Problem: Safety  Goal: Patient will be free from injury during hospitalization  Outcome: Progressing  Call bell and personal belongings within reach, bed alarm on. Hourly rounding done.    Problem: Pain  Goal: Patient's pain/discomfort is manageable  Outcome: Progressing  Pt not in pain at this time, cough better. Will continue to monitor.    Problem: Moderate/High Fall Risk Score >5  Goal: Patient will remain free of falls  Outcome: Progressing  Nonskid socks on, bed at lowest level and locked. Bed alarm on. Pt needs minimal assist. Hourly rounding done.

## 2015-04-08 ENCOUNTER — Encounter: Payer: Self-pay | Admitting: Gastroenterology

## 2015-04-08 NOTE — Op Note (Signed)
PATIENT NAME:  Cynthia Wallace, Cynthia Wallace MR#:  [id number] DATE OF BIRTH:  [birth year]  DATE OF PROCEDURE:  03/14/2015  PRIMARY CARE PHYSICIAN:  Fulton Reek, MD  PREPROCEDURE DIAGNOSIS: Sick sinus syndrome, elective replacement indication.   PROCEDURE: Dual chamber pacemaker generator change out.   INDICATION: The patient is a 79 year old female status post dual-chamber pacemaker for sick sinus syndrome. Recent pacemaker interrogation has shown the pacemaker was at elective replacement indication.   PROCEDURE IN DETAIL: Dual-chamber pacemaker generator change out were explained to the patient and informed written consent was obtained. The patient was brought to the operating room in a fasting state. The left pectoral region was prepped and draped in the usual sterile manner. Anesthesia was obtained with 1% Xylocaine locally. A 6 cm incision was performed over the left pectoral region. The old pacemaker generator was retrieved by electrocautery and blunt dissection. The leads were disconnected to the old pacemaker generator and connected to a new dual-chamber rate responsive pacemaker generator (Adapta [id number]). The pacemaker pocket was irrigated with gentamicin solution. The new pacemaker generator was positioned in the pocket. The pocket was closed with 2-0 and 4-0 Vicryl, respectively. Steri-Strips and a pressure dressing were applied.    ____________________________ Isaias Cowman, MD ap:tr D: 03/14/2015 13:00:43 ET T: 03/14/2015 16:06:47 ET JOB#: [id number]  cc: Isaias Cowman, MD, <Dictator> Isaias Cowman MD ELECTRONICALLY SIGNED 04/03/2015 17:16

## 2015-04-09 LAB — LAB USE ONLY - HISTORICAL SURGICAL PATHOLOGY

## 2015-04-10 ENCOUNTER — Encounter (INDEPENDENT_AMBULATORY_CARE_PROVIDER_SITE_OTHER): Payer: Self-pay | Admitting: "Endocrinology

## 2015-04-10 ENCOUNTER — Ambulatory Visit (INDEPENDENT_AMBULATORY_CARE_PROVIDER_SITE_OTHER): Payer: Medicare (Managed Care) | Admitting: "Endocrinology

## 2015-04-10 ENCOUNTER — Ambulatory Visit (INDEPENDENT_AMBULATORY_CARE_PROVIDER_SITE_OTHER): Payer: Medicare (Managed Care)

## 2015-04-10 VITALS — BP 110/78 | HR 65 | Temp 98.3°F | Resp 16 | Wt 206.9 lb

## 2015-04-10 DIAGNOSIS — J40 Bronchitis, not specified as acute or chronic: Secondary | ICD-10-CM

## 2015-04-10 MED ORDER — FLUTICASONE-SALMETEROL 250-50 MCG/DOSE IN AEPB
1.0000 | INHALATION_SPRAY | Freq: Two times a day (BID) | RESPIRATORY_TRACT | Status: DC
Start: 2015-04-10 — End: 2016-03-07

## 2015-04-10 NOTE — Progress Notes (Signed)
Nurse case coordinator introduce self to patient and daughter. Case coordinating services from [facility] explained to both. Patient and pt's daughter verb und in role.    Meeting with patient to discuss transition /disposition plan with patient. Patient recently moved to [facility] . Pt provided with information regarding PCP- [facility] and [facility]. Pt will be attempting to establish with Dr. Heber Carolina, [facility].    All questions answered.Patient verb und and agreed with POC

## 2015-04-10 NOTE — Progress Notes (Signed)
History of Present Illness:     This patient is a 79 y.o. female, with PMH significant for HTN, Cardiomyopathy, atrial fibrillation, HLD, arthritis as well basal cell carcinoma, here for her initial [facility] visit after her recent hospitalization at [facility], April 24 to 30, for Strep gallolyticus bacteremia usually seen with colon CA.  She, therefore underwent colonoscopy, 3 polyps removed, bx pending, to follow-up with GI, Dr. Marnette Burgess to discuss results.  She also had bronchitis during this hospitalization for which she was started on Augmentin.  She also had pelvic and groin pain, CT of lumbar showed DJD otherwise no acute abnormality.     1) She presents to clinic today with no report of GI symptoms, except chronic, intermittent constipation.  She has had no BM since after her colonoscopy on Friday.  She is aware that she has to follow-up with GI.      2) Cough and phlegm has improved significantly, wheezing has resolved.  She has had intermittent cough and phlegm since August of last year.  She has no hx of asthma or chronic bronchitis, she has no smoking hx.  She was on Albuterol as needed and Dulera, she states that the [facility] has not really worked.  She was taking Advair before which she feels is more effective in keeping off bronchitis. She denies SOB or DOB. She is currently completing course of Augmentin.     3) She continues on her Xarelto for afib, with no complications.  She is in sinus rhythm.     4) Pelvic and groin pain is almost resolved, mild and only with ambulation.      5) BP at [facility] - 110/78.      Review of Systems:     Review of Systems   Constitutional: Negative for fever, chills, malaise/fatigue and diaphoresis.   HENT: Negative for congestion and sore throat.    Eyes: Negative for blurred vision and double vision.   Respiratory: Positive for cough and sputum production. Negative for shortness of breath, wheezing and stridor.    Cardiovascular: Negative for chest pain  and palpitations.   Gastrointestinal: Positive for constipation. Negative for nausea, vomiting, abdominal pain, diarrhea, blood in stool and melena.   Genitourinary: Negative for dysuria.   Musculoskeletal: Negative for myalgias.   Neurological: Negative for dizziness, tingling, tremors, focal weakness, weakness and headaches.       Physical Exam:     Filed Vitals:    04/10/15 1118   BP: 110/78   Pulse: 65   Temp: 98.3 F (36.8 C)   Resp: 16   SpO2: 98%       Wt Readings from Last 3 Encounters:   04/10/15 93.849 kg (206 lb 14.4 oz)   04/06/15 97.977 kg (216 lb)   03/27/15 97.07 kg (214 lb)       General: awake, alert, oriented x 3  HEENT: perrla, eomi, sclera anicteric,oropharynx clear without lesions, mucous membranes moist  Neck: supple, no lymphadenopathy, no thyromegaly, no JVD, no carotid bruits  Cardiovascular: S1, S2, regular rate and rhythm, no murmurs, rubs, or gallops  Lungs: clear to auscultation bilaterally, without wheezing, rhonchi, or rales  Abdomen: soft, non tender, normal bowel sounds  Extremities: no clubbing, cyanosis, or edema  Neuro: cranial nerves grossly intact, strength 5/5 in upper and lower extremities, sensation intact  Skin: no rashes or lesions noted    Diagnostics:     Lab Results   Component Value Date    WBC 9.86 04/07/2015  HGB 11.6* 04/07/2015    HCT 36.7* 04/07/2015    PLT 219 04/07/2015    ALT 21 04/03/2015    AST 25 04/03/2015    NA 143 04/06/2015    K 3.9 04/06/2015    CL 104 04/06/2015    CREAT 0.7 04/06/2015    BUN 18.4 04/06/2015    CO2 27 04/06/2015    INR 1.2 04/06/2015    GLU 115* 04/06/2015       No results found.    Assessment:     Patient Active Problem List   Diagnosis   . Pelvic pain   . HTN (hypertension)   . Cardiomyopathy   . Atrial fibrillation   . HLD (hyperlipidemia)   . Bacteremia       Plan:     Shekira was seen today for follow-up.    Diagnoses and all orders for this visit:    Bronchitis  Orders:  -     fluticasone-salmeterol (ADVAIR DISKUS) 250-50  MCG/DOSE Aerosol Powder, Breath Activtivatede; Inhale 1 puff into the lungs 2 (two) times daily.    1) Bacteremia with strep gallolyticus - colonoscopy done with 3 polyps removed with biopsy taken - she has no acute GI sx - follow-up with GI, Dr. Marnette Burgess, [facility] as recommended  2) Constipation - chronic and intermittent - start Colace 100 mg. PO BID; start Miralax PO daily as needed  3) HTN - well controlled - continue  Losartan and Metoprolol  4) Atrial fibrillation - stable - continue Metoprolol, Digoxin  and Xarelto  5) Cardiomyopathy - stable - continue Lasix and Metoprolol   6) Bronchitis - intermittent since August 2015 - Change Dulera to Advair 250-50 mcg 1 puff BID; continue Albuterol as needed; complete course of Augmentin; she will need PFT to r/o COPD - will refer to Pulmo next office visit  7) Pelvic and groin area - CT lumbar unremarkable; pain is improved; -  likely arthritic pain - continue Norco and Flexeril as needed;  She will likely benefit from PT - patient's daughter states that they have an order for home PT  8) HLD - continue Lipitor      FOLLOW-UP PLAN:2 weeks     PROGRESS ON ESTABLISHING A MEDICAL HOME:Has insurance PCP list given

## 2015-04-10 NOTE — Patient Instructions (Addendum)
1) Stop the Dulera    2) Start Advair diskus 250/50 mcg 1 puff inhalation twice daily for your bronchitis    3) Start Colace 100 mg. 1 tablet by mouth twice daily for bowel regularity    4) Start Miralax 17 grams by mouth daily as needed for constipation.      Follow up with Alfonzo Beers, MD. Schedule an appointment as soon as possible for a visit in 1 week.     Specialties: Infectious Disease, Internal Medicine    Contact information:    [address] [facility]   [address]   [place] [address]   [phone]           Follow up with Nani Skillern, MD. Schedule an appointment as soon as possible for a visit in 1 week.    Specialty: Gastroenterology    Contact information:    [address]   [address] [address]   [phone]           Follow up with Amedeo Gory, MD. Schedule an appointment as soon as possible for a visit in 4 weeks.    Specialties: Cardiology, Internal Medicine    Contact information:    [phone] [facility]   [address]   [place] [address]   [phone]

## 2015-04-15 NOTE — Retrospective Coding Query (Addendum)
PHYSICIAN'S DOCUMENTATION                                                                      REQUEST                                                                         Date of Request:  04/15/2015  Type of Request:  UNDERLYING CAUSE OF SYMPTOM                                         Patient Name: Bethany Howell, Bethany Howell  Account #: [id number]  MR #: [id number]  Discharge Date: 04/07/2015      Dear Dr. Alinda Sierras,    Patient presents with pelvic pain and weakness.  Diagnosed with bacteremia.  Evaluated by infectious disease, cardiology, and gastroenterology.    Progress note 04/03/15 documents "Bacteremia.  She recently had pacemaker battery exchange, possibly primary source.  Continue with IV antibiotics.  Repeat blood culture in the morning.  If still positive pacemaker need to come out".     Patient treated  with IV antibiotics.  Repeat cultures negative.    Progress note 04/05/2015 documents "Bacteremia. Streptococcus gallolyticus .  We will do colonoscopy to rule out colon cancer organism often seen in individual with colon cancer".    D/C summary documents "Patient is 79 year old female came in with abdominal pain groin pain bilateral thighs pain.  Diagnosed with bacteremia, most probably was responsible for her weak symptoms.  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  Colonoscopy done 3 polyps removed 1 was 15 mm patient will follow up with gastroenterologist in 1 week to discussed results.  Follow up with infectious disease specialist in 1 week.  I will discharge her on Augmentin for 12 more days for infectious disease specialist recommendation".      QUESTION:  Please document the underlying cause bacteremia  if known or suspected. Thank you!        Important Note:  In the inpatient setting, you may document "Possible" "Probable" diagnoses that you have not ruled out at point of discharge. Any conditions ruled out during the hospital stay will  not be coded. Documentation should include the medical decision making process and other clinical information supporting the suspected condition.          PHYSICIAN RESPONSE:                    Coder Pati Gallo  Date 04/15/2015         Bacteremia due to colonic polyps

## 2015-04-24 ENCOUNTER — Encounter (INDEPENDENT_AMBULATORY_CARE_PROVIDER_SITE_OTHER): Payer: Self-pay | Admitting: "Endocrinology

## 2015-04-24 ENCOUNTER — Ambulatory Visit (INDEPENDENT_AMBULATORY_CARE_PROVIDER_SITE_OTHER): Payer: Medicare (Managed Care) | Admitting: "Endocrinology

## 2015-04-24 VITALS — BP 119/65 | HR 87 | Temp 98.5°F | Resp 16 | Wt 210.0 lb

## 2015-04-24 DIAGNOSIS — I4891 Unspecified atrial fibrillation: Secondary | ICD-10-CM

## 2015-04-24 DIAGNOSIS — I10 Essential (primary) hypertension: Secondary | ICD-10-CM

## 2015-04-24 DIAGNOSIS — G629 Polyneuropathy, unspecified: Secondary | ICD-10-CM

## 2015-04-24 DIAGNOSIS — R1030 Lower abdominal pain, unspecified: Secondary | ICD-10-CM

## 2015-04-24 MED ORDER — DIGOXIN 125 MCG PO TABS
125.0000 ug | ORAL_TABLET | Freq: Every day | ORAL | Status: AC
Start: 2015-04-24 — End: ?

## 2015-04-24 MED ORDER — GABAPENTIN 100 MG PO CAPS
100.0000 mg | ORAL_CAPSULE | Freq: Three times a day (TID) | ORAL | Status: AC
Start: 2015-04-24 — End: ?

## 2015-04-24 NOTE — Progress Notes (Signed)
History of Present Illness:     This patient is a 79 y.o. female, with PMH significant for HTN, Cardiomyopathy, atrial fibrillation, HLD, arthritis as well basal cell carcinoma, who was initially referred to [facility] after hospitalization for Strep gallolyticus bacteremia usually seen with colon CA. She, therefore underwent colonoscopy, 3 polyps removed, bx pending, to follow-up with GI, Dr. Marnette Howell to discuss results. She also had bronchitis during this hospitalization for which she was started on Augmentin. She also had pelvic and groin pain, CT of lumbar showed DJD otherwise no acute abnormality. She is here with her daughter today for a follow-up.     1) She has no GI complaints, except for a mildly upset stomach yesterday, which resolved with no intervention.  She has an appointment with her GI, Dr. Marnette Howell tomorrow.       2) She completed course of Augmentin for her bronchitis.  She reports cough, wheezing as well as SOB is now resolved.  She continues on the Advair twice daily.      3) She continues on her Xarelto, Digoxin and Metoprolol  for afib, with no complications. She is in sinus rhythm during this encounter.      4) Pelvic and bilateral groin pain, left > right has gotten worse again.  She reports that the left groin pain radiates down the anterior left thigh, she feels "burning sensation," to bilateral legs.  She states that using the stairs, position changes as well as prolonged standing and ambulation worsens the pain.  She saw her Urologist last week, who prescribed "vaginal suppository" (this is most likely the Valium vaginal) for pelvic muscle spasms.  Patient states that her pelvic pain seems to be better since she started on this tx.  She denies urinary or bladder  incontinence.   Will refer to Ortho for the groin pain.      5) Denies chest pain or palpitation, headache or dizziness, POCT BP at goal, 119/65.     Review of Systems:     Review of Systems   Constitutional: Negative for  fever, chills, malaise/fatigue and diaphoresis.   HENT: Negative for congestion.    Respiratory: Negative for cough, sputum production, shortness of breath and wheezing.    Cardiovascular: Negative for chest pain and palpitations.   Gastrointestinal: Negative for nausea, vomiting, abdominal pain, diarrhea, constipation, blood in stool and melena.   Musculoskeletal: Positive for joint pain (bilaeral groin area).   Neurological: Negative for dizziness, tingling, weakness and headaches.   Endo/Heme/Allergies: Does not bruise/bleed easily.       Physical Exam:     Filed Vitals:    04/24/15 1447   BP: 119/65   Pulse: 87   Temp: 98.5 F (36.9 C)   Resp: 16   SpO2: 98%       Wt Readings from Last 3 Encounters:   04/24/15 95.255 kg (210 lb)   04/10/15 93.849 kg (206 lb 14.4 oz)   04/06/15 97.977 kg (216 lb)       General: awake, alert, oriented x 3  HEENT: perrla, eomi, sclera anicteric,oropharynx clear without lesions, mucous membranes moist  Neck: supple, no lymphadenopathy, no thyromegaly, no JVD, no carotid bruits  Cardiovascular: S1, S2, regular rate and rhythm, no murmurs, rubs, or gallops  Lungs: clear to auscultation bilaterally, without wheezing, rhonchi, or rales  Abdomen: soft, non tender, normal bowel sounds  Extremities: no clubbing, cyanosis, or edema  Neuro: cranial nerves grossly intact, strength 5/5 in upper and 4/5 lower extremities, sensation  intact  Skin: no rashes or lesions noted    Diagnostics:     Lab Results   Component Value Date    WBC 9.86 04/07/2015    HGB 11.6* 04/07/2015    HCT 36.7* 04/07/2015    PLT 219 04/07/2015    ALT 21 04/03/2015    AST 25 04/03/2015    NA 143 04/06/2015    K 3.9 04/06/2015    CL 104 04/06/2015    CREAT 0.7 04/06/2015    BUN 18.4 04/06/2015    CO2 27 04/06/2015    INR 1.2 04/06/2015    GLU 115* 04/06/2015       No results found.    Assessment:     Patient Active Problem List   Diagnosis   . Pelvic pain   . HTN (hypertension)   . Cardiomyopathy   . Atrial fibrillation     . HLD (hyperlipidemia)   . Bacteremia   . Bronchitis       Plan:     Bethany Howell was seen today for follow-up and atrial fibrillation.    Diagnoses and all orders for this visit:    Neuropathy  Orders:  -     gabapentin (NEURONTIN) 100 MG capsule; Take 1 capsule (100 mg total) by mouth 3 (three) times daily.    Groin pain, unspecified laterality  Orders:  -     Referral to Orthopaedic Sports Med    Essential hypertension  Orders:  -     Basic Metabolic Panel    Atrial fibrillation, unspecified type  Orders:  -     Digoxin level    Other orders  -     digoxin (LANOXIN) 0.125 MG tablet; Take 1 tablet (125 mcg total) by mouth daily.    1) Bacteremia with strep gallolyticus - colonoscopy done with 3 polyps removed with biopsy taken - she has no acute GI sx - follow-up with GI, Dr. Marnette Howell, Bethany Howell as planned  2) HTN - well controlled - continue Losartan and Metoprolol  4) Atrial fibrillation - stable - continue Metoprolol, Digoxin and Xarelto  5) Cardiomyopathy - stable - continue Lasix and Metoprolol   6) Bronchitis - stable - completed Augmentin; continue Advair and Albuterol as needed; I was supposed to refer her to Pulmonology for PFT, however, patient as well as her daughter were both overwhelmed by all the specialty follow-up that they have to go; patient's priority right now is  to see Ortho due to her groin pain.    7) Pelvic and groin area - CT lumbar unremarkable; she cannot get MRI, she has ICD; her sx are c/w neuropathy - trial Neurontin 100 mg. PO TID; referred to Ortho  8) HLD - continue Lipitor   9) BMP and Digoxin level ordered    FOLLOW-UP PLAN:2 weeks if unable to see PCP     PROGRESS ON ESTABLISHING A MEDICAL HOME:Has insurance - working

## 2015-04-24 NOTE — Patient Instructions (Signed)
1) Stop taking the Norco    2) Start taking Neurontin 100 mg. 1 tablet by mouth three times daily for neuropathy.     3) Continue to take all your other medications as prescribed.     4) We will refer you to an Orthopedic doctor for further evaluation and management of your groin pain.

## 2015-04-25 LAB — BASIC METABOLIC PANEL
BUN / Creatinine Ratio: 23 (ref 11–26)
BUN: 13 mg/dL (ref 8–27)
CO2: 21 mmol/L (ref 18–29)
Calcium: 9 mg/dL (ref 8.7–10.3)
Chloride: 101 mmol/L (ref 97–108)
Creatinine: 0.56 mg/dL — ABNORMAL LOW (ref 0.57–1.00)
EGFR: 104 mL/min/{1.73_m2} (ref >59)
EGFR: 90 mL/min/{1.73_m2} (ref >59)
Glucose: 86 mg/dL (ref 65–99)
Potassium: 4 mmol/L (ref 3.5–5.2)
Sodium: 142 mmol/L (ref 134–144)

## 2015-04-25 LAB — DIGOXIN LEVEL: Digoxin Level: 0.7 ng/mL — ABNORMAL LOW (ref 0.9–2.0)

## 2015-06-04 LAB — VAHRT HISTORIC LVEF: Ejection Fraction: 60 %

## 2015-07-03 ENCOUNTER — Ambulatory Visit (INDEPENDENT_AMBULATORY_CARE_PROVIDER_SITE_OTHER): Payer: Self-pay | Admitting: Cardiovascular Disease

## 2015-07-03 ENCOUNTER — Other Ambulatory Visit (INDEPENDENT_AMBULATORY_CARE_PROVIDER_SITE_OTHER): Payer: Self-pay

## 2015-08-29 ENCOUNTER — Ambulatory Visit: Admission: RE | Admit: 2015-08-29 | Payer: Self-pay | Source: Ambulatory Visit | Admitting: Unknown Physician Specialty

## 2015-08-29 ENCOUNTER — Encounter: Admission: RE | Payer: Self-pay | Source: Ambulatory Visit

## 2015-08-29 SURGERY — COLONOSCOPY WITH PROPOFOL
Anesthesia: General

## 2015-11-15 ENCOUNTER — Encounter (INDEPENDENT_AMBULATORY_CARE_PROVIDER_SITE_OTHER): Payer: Self-pay

## 2015-11-26 ENCOUNTER — Encounter (INDEPENDENT_AMBULATORY_CARE_PROVIDER_SITE_OTHER): Payer: Self-pay

## 2015-11-26 ENCOUNTER — Ambulatory Visit (INDEPENDENT_AMBULATORY_CARE_PROVIDER_SITE_OTHER): Payer: Medicare (Managed Care) | Admitting: Nurse Practitioner

## 2015-11-26 VITALS — BP 128/84 | HR 89 | Temp 97.6°F | Resp 16 | Ht 65.0 in | Wt 210.0 lb

## 2015-11-26 DIAGNOSIS — J40 Bronchitis, not specified as acute or chronic: Secondary | ICD-10-CM

## 2015-11-26 DIAGNOSIS — R062 Wheezing: Secondary | ICD-10-CM

## 2015-11-26 MED ORDER — PREDNISONE 20 MG PO TABS
20.0000 mg | ORAL_TABLET | Freq: Two times a day (BID) | ORAL | Status: AC
Start: 2015-11-26 — End: 2015-12-01

## 2015-11-26 MED ORDER — ALBUTEROL-IPRATROPIUM 2.5-0.5 (3) MG/3ML IN SOLN
3.0000 mL | Freq: Once | RESPIRATORY_TRACT | Status: AC
Start: 2015-11-26 — End: 2015-11-26
  Administered 2015-11-26: 3 mL via RESPIRATORY_TRACT

## 2015-11-26 MED ORDER — DOXYCYCLINE HYCLATE 100 MG PO CAPS
100.0000 mg | ORAL_CAPSULE | Freq: Two times a day (BID) | ORAL | Status: AC
Start: 2015-11-26 — End: 2015-12-03

## 2015-11-26 NOTE — Progress Notes (Signed)
[facility]    PROGRESS NOTE      Patient: Bethany Howell   Date: 11/26/2015   MRN: [id number]     Past Medical History   Diagnosis Date   . Hypertension    . Cardiomyopathy    . Atrial fibrillation      with pacer    . Arthritis    . Hypercholesterolemia    . Basal cell carcinoma      Social History     Social History   . Marital Status: Widowed     Spouse Name: N/A   . Number of Children: N/A   . Years of Education: N/A     Occupational History   . Not on file.     Social History Main Topics   . Smoking status: Never Smoker    . Smokeless tobacco: Not on file   . Alcohol Use: No   . Drug Use: No   . Sexual Activity: Not on file     Other Topics Concern   . Not on file     Social History Narrative     No family history on file.    ASSESSMENT/PLAN     Bethany Howell is a 79 y.o. female    Chief Complaint   Patient presents with   . Cough        1. Wheezing  - albuterol-ipratropium (DUO-NEB) 2.5-0.5(3) mg/3 mL nebulizer 3 mL; Take 3 mLs by nebulization one time.      2. Bronchitis  Wheezing cleared after Duo-neb Rx. No resp distress. Easy resp. With congested cough.Continue the use of OTC mucinex or decongestant (Avoid pseudophedrine). Cool mist humidifer, with some vicks as needed. Rest, fluids, Vit C, Use proair inhaler as directed to help open up your airways. Also restart your Advair inhaler once done with prednisone. Rinse mouth after each use. Continue to take deep breaths while awake to promote lung expansion and prevent pneumnonia. Tylenol or Motrin for discomfort. Reviewed medication use and side effects with patient, please complete entire course of antibiotics. Reviewed with patient that the antibiotic will continue to work and stay in the system for 10 days after finishing the abx. Also recommended taking probiotics or eat yogurt while on abx to restore good gut flora. Discussed s/sx that warrant RTO. Please follow up with PCP.          Results for orders placed or performed in visit on  04/24/15   Basic Metabolic Panel   Result Value Ref Range    Glucose 86 65 - 99 mg/dL    BUN 13 8 - 27 mg/dL    Creatinine 5.40 (L) 0.57 - 1.00 mg/dL    EGFR 90 >98 JX/BJY/7.82    EGFR 104 >59 mL/min/1.73    BUN/Creatinine Ratio 23 11 - 26    Sodium 142 134 - 144 mmol/L    Potassium 4.0 3.5 - 5.2 mmol/L    Chloride 101 97 - 108 mmol/L    CO2 21 18 - 29 mmol/L    Calcium 9.0 8.7 - 10.3 mg/dL   Digoxin level   Result Value Ref Range    Digoxin Level 0.7 (L) 0.9 - 2.0 ng/mL       Risk & Benefits of the new medication(s) were explained to the patient (and family) who verbalized understanding & agreed to the treatment plan. Patient (family) encouraged to contact me/clinical staff with any questions/concerns      MEDICATIONS     Current  Outpatient Prescriptions   Medication Sig Dispense Refill   . atorvastatin (LIPITOR) 10 MG tablet Take 10 mg by mouth daily.     . Biotin 1000 MCG tablet Take 1,000 mcg by mouth 3 (three) times daily.     . Cholecalciferol (VITAMIN D-3 PO) Take by mouth.     . cyclobenzaprine (FLEXERIL) 10 MG tablet Take 1 tablet (10 mg total) by mouth 3 (three) times daily as needed for Muscle spasms. 30 tablet 0   . digoxin (LANOXIN) 0.125 MG tablet Take 1 tablet (125 mcg total) by mouth daily. 90 tablet 0   . fluticasone-salmeterol (ADVAIR DISKUS) 250-50 MCG/DOSE Aerosol Powder, Breath Activtivatede Inhale 1 puff into the lungs 2 (two) times daily. 1 each 3   . furosemide (LASIX) 20 MG tablet Take 20 mg by mouth 2 (two) times daily.     Marland Kitchen gabapentin (NEURONTIN) 100 MG capsule Take 1 capsule (100 mg total) by mouth 3 (three) times daily. 90 capsule 3   . guaiFENesin (ROBITUSSIN) 100 MG/5ML Solution Take 10 mLs (200 mg total) by mouth 4 (four) times daily. 236 mL 0   . losartan (COZAAR) 25 MG tablet Take 50 mg by mouth daily.        . metoprolol XL (TOPROL-XL) 50 MG 24 hr tablet Take 50 mg by mouth 2 (two) times daily.     . Potassium (POTASSIMIN PO) Take by mouth.     . rivaroxaban (XARELTO) 20 MG Tab  Take 20 mg by mouth daily with dinner.     Marland Kitchen albuterol (PROVENTIL HFA;VENTOLIN HFA) 108 (90 BASE) MCG/ACT inhaler Inhale 2 puffs into the lungs.     . budesonide-formoterol (SYMBICORT) 160-4.5 MCG/ACT inhaler Inhale 2 puffs into the lungs 2 (two) times daily.     Marland Kitchen doxycycline (VIBRAMYCIN) 100 MG capsule Take 1 capsule (100 mg total) by mouth 2 (two) times daily. 14 capsule 0   . HYDROcodone-acetaminophen (NORCO) 5-325 MG per tablet Take 1 tablet by mouth every 6 (six) hours as needed for Pain.     . predniSONE (DELTASONE) 20 MG tablet Take 1 tablet (20 mg total) by mouth 2 (two) times daily. 10 tablet 0     No current facility-administered medications for this visit.       Allergies   Allergen Reactions   . Codeine        SUBJECTIVE     Chief Complaint   Patient presents with   . Cough        Cough  This is a new problem. Episode onset: Wednesday 11/21/2015. The problem has been unchanged. The problem occurs constantly. The cough is productive of sputum. Associated symptoms include headaches, nasal congestion, postnasal drip and shortness of breath. Pertinent negatives include no chest pain, ear pain or fever. The symptoms are aggravated by pollens. She has tried OTC cough suppressant for the symptoms. Her past medical history is significant for asthma and bronchitis (recurrent).       ROS     Review of Systems   Constitutional: Negative for fever.   HENT: Positive for postnasal drip. Negative for ear pain.    Respiratory: Positive for cough and shortness of breath. Negative for chest tightness.    Cardiovascular: Negative for chest pain.   Gastrointestinal: Negative for nausea, vomiting and diarrhea.   Neurological: Positive for headaches.       The following portions of the patient's history were reviewed and updated as appropriate: Allergies, Current Medications, Past Family History, Past Medical history,  Past social history, Past surgical history, and Problem List.    PHYSICAL EXAM     Filed Vitals:    11/26/15  1310   BP: 128/84   Pulse: 89   Temp: 97.6 F (36.4 C)   TempSrc: Oral   Resp: 16   Height: 1.651 m (5\' 5" )   Weight: 95.255 kg (210 lb)   SpO2: 97%       Physical Exam   Nursing note and vitals reviewed.  Constitutional: She is oriented to person, place, and time. She appears well-developed and well-nourished.   HENT:   Head: Normocephalic and atraumatic.   Cardiovascular: Normal rate, regular rhythm, S1 normal, S2 normal, normal heart sounds and intact distal pulses.  PMI is not displaced.  Exam reveals no gallop and no friction rub.    No murmur heard.  Pulmonary/Chest: Effort normal. No accessory muscle usage. No respiratory distress. She has wheezes in the right upper field, the right middle field and the right lower field. She has rhonchi in the left middle field and the left lower field. She has no rales. She exhibits no tenderness.   Abdominal: Soft. Bowel sounds are normal.   Musculoskeletal: Normal range of motion.   Neurological: She is alert and oriented to person, place, and time.   Skin: Skin is warm and dry.   Psychiatric: She has a normal mood and affect. Her behavior is normal. Judgment and thought content normal.     Ortho Exam  Neurologic Exam     Mental Status   Oriented to person, place, and time.       PROCEDURE(S)     Procedures        Signed,  Solon Palm, FNP  11/26/2015

## 2015-12-19 DIAGNOSIS — G4733 Obstructive sleep apnea (adult) (pediatric): Secondary | ICD-10-CM | POA: Diagnosis not present

## 2016-01-19 DIAGNOSIS — G4733 Obstructive sleep apnea (adult) (pediatric): Secondary | ICD-10-CM | POA: Diagnosis not present

## 2016-02-16 DIAGNOSIS — G4733 Obstructive sleep apnea (adult) (pediatric): Secondary | ICD-10-CM | POA: Diagnosis not present

## 2016-03-03 DIAGNOSIS — N393 Stress incontinence (female) (male): Secondary | ICD-10-CM | POA: Diagnosis not present

## 2016-03-03 DIAGNOSIS — R102 Pelvic and perineal pain: Secondary | ICD-10-CM | POA: Diagnosis not present

## 2016-03-04 ENCOUNTER — Other Ambulatory Visit (INDEPENDENT_AMBULATORY_CARE_PROVIDER_SITE_OTHER): Payer: Self-pay

## 2016-03-04 ENCOUNTER — Ambulatory Visit (INDEPENDENT_AMBULATORY_CARE_PROVIDER_SITE_OTHER): Payer: Self-pay | Admitting: Cardiovascular Disease

## 2016-03-04 DIAGNOSIS — I10 Essential (primary) hypertension: Secondary | ICD-10-CM | POA: Diagnosis not present

## 2016-03-04 DIAGNOSIS — I340 Nonrheumatic mitral (valve) insufficiency: Secondary | ICD-10-CM | POA: Diagnosis not present

## 2016-03-04 DIAGNOSIS — I482 Chronic atrial fibrillation: Secondary | ICD-10-CM | POA: Diagnosis not present

## 2016-03-04 DIAGNOSIS — G4733 Obstructive sleep apnea (adult) (pediatric): Secondary | ICD-10-CM | POA: Diagnosis not present

## 2016-03-05 ENCOUNTER — Other Ambulatory Visit: Payer: Self-pay | Admitting: Cardiovascular Disease

## 2016-03-05 DIAGNOSIS — I341 Nonrheumatic mitral (valve) prolapse: Secondary | ICD-10-CM

## 2016-03-07 ENCOUNTER — Inpatient Hospital Stay
Admission: EM | Admit: 2016-03-07 | Discharge: 2016-03-13 | DRG: 193 | Disposition: A | Discharge status: Home Health | Payer: Medicare (Managed Care) | Attending: Internal Medicine | Admitting: Internal Medicine

## 2016-03-07 ENCOUNTER — Emergency Department: Payer: Medicare (Managed Care)

## 2016-03-07 ENCOUNTER — Encounter (INDEPENDENT_AMBULATORY_CARE_PROVIDER_SITE_OTHER): Payer: Self-pay | Admitting: Nurse Practitioner

## 2016-03-07 ENCOUNTER — Ambulatory Visit (INDEPENDENT_AMBULATORY_CARE_PROVIDER_SITE_OTHER): Payer: Medicare (Managed Care) | Admitting: Nurse Practitioner

## 2016-03-07 ENCOUNTER — Other Ambulatory Visit: Payer: Medicare (Managed Care)

## 2016-03-07 ENCOUNTER — Inpatient Hospital Stay: Payer: Medicare (Managed Care) | Admitting: Internal Medicine

## 2016-03-07 VITALS — BP 162/74 | HR 83 | Temp 100.4°F | Resp 18 | Ht 64.0 in | Wt 225.0 lb

## 2016-03-07 DIAGNOSIS — R0902 Hypoxemia: Secondary | ICD-10-CM | POA: Diagnosis present

## 2016-03-07 DIAGNOSIS — I482 Chronic atrial fibrillation, unspecified: Secondary | ICD-10-CM

## 2016-03-07 DIAGNOSIS — R0609 Other forms of dyspnea: Secondary | ICD-10-CM | POA: Diagnosis present

## 2016-03-07 DIAGNOSIS — I4891 Unspecified atrial fibrillation: Secondary | ICD-10-CM | POA: Diagnosis present

## 2016-03-07 DIAGNOSIS — I480 Paroxysmal atrial fibrillation: Secondary | ICD-10-CM | POA: Diagnosis present

## 2016-03-07 DIAGNOSIS — I509 Heart failure, unspecified: Secondary | ICD-10-CM

## 2016-03-07 DIAGNOSIS — I5033 Acute on chronic diastolic (congestive) heart failure: Secondary | ICD-10-CM | POA: Diagnosis present

## 2016-03-07 DIAGNOSIS — Z85828 Personal history of other malignant neoplasm of skin: Secondary | ICD-10-CM

## 2016-03-07 DIAGNOSIS — E785 Hyperlipidemia, unspecified: Secondary | ICD-10-CM | POA: Diagnosis present

## 2016-03-07 DIAGNOSIS — Z7901 Long term (current) use of anticoagulants: Secondary | ICD-10-CM

## 2016-03-07 DIAGNOSIS — E7800 Pure hypercholesterolemia, unspecified: Secondary | ICD-10-CM

## 2016-03-07 DIAGNOSIS — J189 Pneumonia, unspecified organism: Secondary | ICD-10-CM | POA: Diagnosis present

## 2016-03-07 DIAGNOSIS — I429 Cardiomyopathy, unspecified: Secondary | ICD-10-CM | POA: Diagnosis present

## 2016-03-07 DIAGNOSIS — I10 Essential (primary) hypertension: Secondary | ICD-10-CM | POA: Diagnosis present

## 2016-03-07 DIAGNOSIS — R0602 Shortness of breath: Secondary | ICD-10-CM | POA: Insufficient documentation

## 2016-03-07 DIAGNOSIS — D696 Thrombocytopenia, unspecified: Secondary | ICD-10-CM | POA: Diagnosis present

## 2016-03-07 DIAGNOSIS — D649 Anemia, unspecified: Secondary | ICD-10-CM | POA: Diagnosis present

## 2016-03-07 DIAGNOSIS — R062 Wheezing: Secondary | ICD-10-CM

## 2016-03-07 DIAGNOSIS — Z950 Presence of cardiac pacemaker: Secondary | ICD-10-CM

## 2016-03-07 DIAGNOSIS — R600 Localized edema: Secondary | ICD-10-CM | POA: Diagnosis present

## 2016-03-07 DIAGNOSIS — R2243 Localized swelling, mass and lump, lower limb, bilateral: Secondary | ICD-10-CM | POA: Diagnosis not present

## 2016-03-07 DIAGNOSIS — R609 Edema, unspecified: Secondary | ICD-10-CM | POA: Diagnosis not present

## 2016-03-07 DIAGNOSIS — R0600 Dyspnea, unspecified: Secondary | ICD-10-CM | POA: Insufficient documentation

## 2016-03-07 DIAGNOSIS — R079 Chest pain, unspecified: Secondary | ICD-10-CM | POA: Diagnosis not present

## 2016-03-07 LAB — BASIC METABOLIC PANEL
Anion Gap: 10 (ref 5.0–15.0)
BUN: 12.6 mg/dL (ref 7.0–19.0)
CO2: 25 mEq/L (ref 22–29)
Calcium: 8.9 mg/dL (ref 7.9–10.2)
Chloride: 105 mEq/L (ref 100–111)
Creatinine: 0.9 mg/dL (ref 0.6–1.0)
Glucose: 123 mg/dL — ABNORMAL HIGH (ref 70–100)
Potassium: 3.5 mEq/L (ref 3.5–5.1)
Sodium: 140 mEq/L (ref 136–145)

## 2016-03-07 LAB — CBC AND DIFFERENTIAL
Hematocrit: 34.1 % — ABNORMAL LOW (ref 37.0–47.0)
Hgb: 10.7 g/dL — ABNORMAL LOW (ref 12.0–16.0)
MCH: 29.8 pg (ref 28.0–32.0)
MCHC: 31.4 g/dL — ABNORMAL LOW (ref 32.0–36.0)
MCV: 95 fL (ref 80.0–100.0)
MPV: 10 fL (ref 9.4–12.3)
Platelets: 130 10*3/uL — ABNORMAL LOW (ref 140–400)
RBC: 3.59 10*6/uL — ABNORMAL LOW (ref 4.20–5.40)
RDW: 17 % — ABNORMAL HIGH (ref 12–15)
WBC: 4.26 10*3/uL (ref 3.50–10.80)

## 2016-03-07 LAB — COMPREHENSIVE METABOLIC PANEL
ALT: 17 U/L (ref 0–55)
AST (SGOT): 38 U/L — ABNORMAL HIGH (ref 5–34)
Albumin/Globulin Ratio: 1.1 (ref 0.9–2.2)
Albumin: 3.9 g/dL (ref 3.5–5.0)
Alkaline Phosphatase: 133 U/L — ABNORMAL HIGH (ref 37–106)
Anion Gap: 9 (ref 5.0–15.0)
BUN: 12 mg/dL (ref 7.0–19.0)
Bilirubin, Total: 1.6 mg/dL — ABNORMAL HIGH (ref 0.2–1.2)
CO2: 23 mEq/L (ref 22–29)
Calcium: 8.7 mg/dL (ref 7.9–10.2)
Chloride: 107 mEq/L (ref 100–111)
Creatinine: 0.8 mg/dL (ref 0.6–1.0)
Globulin: 3.5 g/dL (ref 2.0–3.6)
Glucose: 85 mg/dL (ref 70–100)
Potassium: 3.9 mEq/L (ref 3.5–5.1)
Protein, Total: 7.4 g/dL (ref 6.0–8.3)
Sodium: 139 mEq/L (ref 136–145)

## 2016-03-07 LAB — MAN DIFF ONLY
Atypical Lymphocytes %: 1 %
Atypical Lymphocytes Absolute: 0.04 10*3/uL — ABNORMAL HIGH
Band Neutrophils Absolute: 0.26 10*3/uL (ref 0.00–1.00)
Band Neutrophils: 6 %
Basophils Absolute Manual: 0 10*3/uL (ref 0.00–0.20)
Basophils Manual: 0 %
Eosinophils Absolute Manual: 0 10*3/uL (ref 0.00–0.70)
Eosinophils Manual: 0 %
Lymphocytes Absolute Manual: 0.77 10*3/uL (ref 0.50–4.40)
Lymphocytes Manual: 18 %
Monocytes Absolute: 0.51 10*3/uL (ref 0.00–1.20)
Monocytes Manual: 12 %
Neutrophils Absolute Manual: 2.68 10*3/uL (ref 1.80–8.10)
Nucleated RBC: 0 /100 WBC (ref 0–1)
Segmented Neutrophils: 63 %

## 2016-03-07 LAB — TROPONIN I: Troponin I: <0.01 ng/mL (ref 0.00–0.09)

## 2016-03-07 LAB — PT AND APTT
PT INR: 1.5
PT: 18 s — ABNORMAL HIGH (ref 12.6–15.0)
PTT: 37 s (ref 23–37)

## 2016-03-07 LAB — ECG 12-LEAD
Atrial Rate: 90 {beats}/min
Q-T Interval: 456 ms
QRS Duration: 196 ms
QTC Calculation (Bezet): 542 ms
R Axis: -88 degrees
T Axis: 90 degrees
Ventricular Rate: 85 {beats}/min

## 2016-03-07 LAB — CELL MORPHOLOGY
Cell Morphology: ABNORMAL — AB
Platelet Estimate: DECREASED — AB

## 2016-03-07 LAB — DIGOXIN LEVEL: Digoxin Level: 0.37 ng/mL — ABNORMAL LOW (ref 0.50–2.00)

## 2016-03-07 LAB — GFR
EGFR: >60
EGFR: >60

## 2016-03-07 LAB — B-TYPE NATRIURETIC PEPTIDE: B-Natriuretic Peptide: 334.8 pg/mL — ABNORMAL HIGH (ref 0.0–100.0)

## 2016-03-07 MED ORDER — ALBUTEROL-IPRATROPIUM 2.5-0.5 (3) MG/3ML IN SOLN
3.0000 mL | Freq: Four times a day (QID) | RESPIRATORY_TRACT | Status: DC
Start: 2016-03-07 — End: 2016-03-09
  Administered 2016-03-07 – 2016-03-09 (×7): 3 mL via RESPIRATORY_TRACT
  Filled 2016-03-07 (×8): qty 3

## 2016-03-07 MED ORDER — DIGOXIN 250 MCG PO TABS
125.0000 ug | ORAL_TABLET | Freq: Every day | ORAL | Status: DC
Start: 2016-03-08 — End: 2016-03-13
  Administered 2016-03-08 – 2016-03-13 (×6): 125 ug via ORAL
  Filled 2016-03-07 (×6): qty 1

## 2016-03-07 MED ORDER — LEVOFLOXACIN IN D5W 500 MG/100ML IV SOLN
500.0000 mg | Freq: Once | INTRAVENOUS | Status: AC
Start: 2016-03-07 — End: 2016-03-07
  Administered 2016-03-07: 500 mg via INTRAVENOUS
  Filled 2016-03-07: qty 100

## 2016-03-07 MED ORDER — SODIUM CHLORIDE 0.9 % IV MBP
1.0000 g | INTRAVENOUS | Status: DC
Start: 2016-03-07 — End: 2016-03-12
  Administered 2016-03-07 – 2016-03-11 (×5): 1 g via INTRAVENOUS
  Filled 2016-03-07 (×6): qty 1000

## 2016-03-07 MED ORDER — ALUM & MAG HYDROXIDE-SIMETH 200-200-20 MG/5ML PO SUSP
15.0000 mL | ORAL | Status: DC | PRN
Start: 2016-03-07 — End: 2016-03-13

## 2016-03-07 MED ORDER — RIVAROXABAN 20 MG PO TABS
20.0000 mg | ORAL_TABLET | Freq: Every day | ORAL | Status: DC
Start: 2016-03-07 — End: 2016-03-13
  Administered 2016-03-07 – 2016-03-12 (×6): 20 mg via ORAL
  Filled 2016-03-07 (×6): qty 1

## 2016-03-07 MED ORDER — FUROSEMIDE 10 MG/ML IJ SOLN
40.0000 mg | Freq: Every day | INTRAMUSCULAR | Status: DC
Start: 2016-03-08 — End: 2016-03-12
  Administered 2016-03-08 – 2016-03-11 (×4): 40 mg via INTRAVENOUS
  Filled 2016-03-07 (×5): qty 4

## 2016-03-07 MED ORDER — ATORVASTATIN CALCIUM 10 MG PO TABS
10.0000 mg | ORAL_TABLET | Freq: Every day | ORAL | Status: DC
Start: 2016-03-07 — End: 2016-03-13
  Administered 2016-03-07 – 2016-03-12 (×6): 10 mg via ORAL
  Filled 2016-03-07 (×6): qty 1

## 2016-03-07 MED ORDER — METHYLPREDNISOLONE SODIUM SUCC 40 MG IJ SOLR
40.0000 mg | Freq: Two times a day (BID) | INTRAMUSCULAR | Status: DC
Start: 2016-03-07 — End: 2016-03-09
  Administered 2016-03-07 – 2016-03-09 (×4): 40 mg via INTRAVENOUS
  Filled 2016-03-07 (×4): qty 1

## 2016-03-07 MED ORDER — ALBUTEROL SULFATE (2.5 MG/3ML) 0.083% IN NEBU
2.5000 mg | INHALATION_SOLUTION | Freq: Once | RESPIRATORY_TRACT | Status: AC
Start: 2016-03-07 — End: 2016-03-07
  Administered 2016-03-07: 2.5 mg via RESPIRATORY_TRACT
  Filled 2016-03-07: qty 3

## 2016-03-07 MED ORDER — GABAPENTIN 100 MG PO CAPS
100.0000 mg | ORAL_CAPSULE | Freq: Three times a day (TID) | ORAL | Status: DC
Start: 2016-03-07 — End: 2016-03-13
  Administered 2016-03-07 – 2016-03-13 (×17): 100 mg via ORAL
  Filled 2016-03-07 (×17): qty 1

## 2016-03-07 MED ORDER — RISAQUAD PO CAPS
1.0000 | ORAL_CAPSULE | Freq: Every day | ORAL | Status: DC
Start: 2016-03-07 — End: 2016-03-13
  Administered 2016-03-07 – 2016-03-13 (×7): 1 via ORAL
  Filled 2016-03-07 (×7): qty 1

## 2016-03-07 MED ORDER — ACETAMINOPHEN 325 MG PO TABS
650.0000 mg | ORAL_TABLET | ORAL | Status: DC | PRN
Start: 2016-03-07 — End: 2016-03-13

## 2016-03-07 MED ORDER — MONTELUKAST SODIUM 10 MG PO TABS
10.0000 mg | ORAL_TABLET | Freq: Every evening | ORAL | Status: DC
Start: 2016-03-07 — End: 2016-03-13
  Administered 2016-03-07 – 2016-03-12 (×6): 10 mg via ORAL
  Filled 2016-03-07 (×6): qty 1

## 2016-03-07 MED ORDER — ONDANSETRON HCL 4 MG/2ML IJ SOLN
4.0000 mg | Freq: Four times a day (QID) | INTRAMUSCULAR | Status: DC | PRN
Start: 2016-03-07 — End: 2016-03-13

## 2016-03-07 MED ORDER — SODIUM CHLORIDE 0.9 % IV SOLN
500.0000 mg | INTRAVENOUS | Status: AC
Start: 2016-03-07 — End: 2016-03-11
  Administered 2016-03-07 – 2016-03-11 (×5): 500 mg via INTRAVENOUS
  Filled 2016-03-07 (×5): qty 500

## 2016-03-07 MED ORDER — FUROSEMIDE 10 MG/ML IJ SOLN
40.0000 mg | Freq: Once | INTRAMUSCULAR | Status: AC
Start: 2016-03-07 — End: 2016-03-07
  Administered 2016-03-07: 40 mg via INTRAVENOUS
  Filled 2016-03-07: qty 4

## 2016-03-07 MED ORDER — IPRATROPIUM BROMIDE 0.02 % IN SOLN
0.5000 mg | Freq: Once | RESPIRATORY_TRACT | Status: AC
Start: 2016-03-07 — End: 2016-03-07
  Administered 2016-03-07: 0.5 mg via RESPIRATORY_TRACT
  Filled 2016-03-07: qty 2.5

## 2016-03-07 MED ORDER — METOPROLOL SUCCINATE ER 50 MG PO TB24
50.0000 mg | ORAL_TABLET | Freq: Two times a day (BID) | ORAL | Status: DC
Start: 2016-03-07 — End: 2016-03-13
  Administered 2016-03-07 – 2016-03-13 (×12): 50 mg via ORAL
  Filled 2016-03-07 (×13): qty 1

## 2016-03-07 MED ORDER — NALOXONE HCL 0.4 MG/ML IJ SOLN (WRAP)
0.2000 mg | INTRAMUSCULAR | Status: DC | PRN
Start: 2016-03-07 — End: 2016-03-13

## 2016-03-07 MED ORDER — SODIUM CHLORIDE 0.9 % IV MBP
1.0000 g | INTRAVENOUS | Status: DC
Start: 2016-03-08 — End: 2016-03-07
  Filled 2016-03-07: qty 1000

## 2016-03-07 MED ORDER — FLUTICASONE FUROATE-VILANTEROL 100-25 MCG/INH IN AEPB
1.0000 | INHALATION_SPRAY | Freq: Every morning | RESPIRATORY_TRACT | Status: DC
Start: 2016-03-08 — End: 2016-03-13
  Administered 2016-03-08 – 2016-03-13 (×6): 1 via RESPIRATORY_TRACT
  Filled 2016-03-07: qty 14

## 2016-03-07 MED ORDER — SODIUM CHLORIDE 0.9 % IV SOLN
500.0000 mg | INTRAVENOUS | Status: DC
Start: 2016-03-08 — End: 2016-03-07
  Filled 2016-03-07: qty 500

## 2016-03-07 MED ORDER — LOSARTAN POTASSIUM 25 MG PO TABS
25.0000 mg | ORAL_TABLET | Freq: Every day | ORAL | Status: DC
Start: 2016-03-08 — End: 2016-03-12
  Administered 2016-03-08 – 2016-03-12 (×5): 25 mg via ORAL
  Filled 2016-03-07 (×5): qty 1

## 2016-03-07 MED ORDER — ALBUTEROL-IPRATROPIUM 2.5-0.5 (3) MG/3ML IN SOLN
3.0000 mL | Freq: Once | RESPIRATORY_TRACT | Status: DC
Start: 2016-03-07 — End: 2016-03-07
  Administered 2016-03-07: 3 mL via RESPIRATORY_TRACT

## 2016-03-07 NOTE — UM Notes (Signed)
Inpatient Admission on same day patient seen in Emergency Department    CC: Shortness of Breath    80 year old female presents with her daughter 1-2 week history of increasing shortness of breath and wheezing. Over the past 2 days she has been more short of breath and has a low-grade fever. She went to urgent care who sent her here because of her oxygen saturation being low. Patient has a pacemaker and has a history of cardiomyopathy and congestive heart failure.    VITAL SIGNS - t 99.2, pulse 83, O2 94%, rr 18, bp 162/74-181/87    Labs - ALT 38, Alk Phos 133, Bili T 1.6, BNP 334.8, Dig lvl 0.37    Chest XRAY -   Findings concerning for left lower lobe pneumonia. Possible  trace left pleural effusion. Follow-up x-ray is recommended to ensure  resolution.      Ed medications  Lasix 40 mg iv, levaquin 500 mg iv, abuterol and ipratropium nebulizer tx    Past Medical History   Diagnosis Date   . Hypertension    . Cardiomyopathy    . Atrial fibrillation      with pacer    . Arthritis    . Hypercholesterolemia    . Basal cell carcinoma    . Congestive heart failure      Past Surgical History   Procedure Laterality Date   . Hysterectomy     . Replacement total knee     . Colonoscopy N/A 04/06/2015     Procedure: COLONOSCOPY;  Surgeon: Nani Skillern, MD;  Location: [facility];  Service: Gastroenterology;  Laterality: N/A;  w/ polypectomy   . Cardiac pacemaker placement           H&P    Cc: Shortness of Breath    2 weeks, gradually worsening within the past 2-3 days. She also reports having cough. Denies high-grade fever. Initially she was short of breath with walking a few steps , but later on she felt short of breath even at rest. Patient also has been noticing worsening lower extremity edema. She was seen by cardiology, a few weeks ago, recommended to obtain lower extremity Dopplers and echocardiogram. Patient was seen at urgent care center this morning and referred to the emergency room for  hypoxia. In the emergency room, Chest x-ray showed left lower lobe pneumonia       Left lower lobe community-acquired pneumonia- will start IV Rocephin and Zithromax. Obtain urine Legionella and strep antigen. O2 support.   Exertional dyspnea with Hypoxia secondary to acute bronchospasm- will start IV Solu-Medrol 40 mg every 12 hours, scheduled DuoNeb every 6 hours, Singulair and Breo Ellipta. O2 support.    Bilateral lower extremity edema- lower extremity Dopplers negative for deep vein thrombosis. start IV Lasix 40 mg daily. Obtain cardiology consult .Monitor I's and O's. Obtain nutrition consult for low sodium diet recommendations.    History of atrial fibrillation, status post permanent pacemaker placement-continue xarelto for anticoagulation.   History of cardiomyopathy, moderate MR and severe TR      ORDERS  ROCEPHIN 1 G IV Q 24 HOURS, sOLUMEDROL 40 MG IV Q 12 HRS  BMP, CBC W/O DIFF, CARDIAC DIET, NEB TX Q 6 HRS, RT Q AM/2XDAILY,TELEMETRY, VS Q 4 HOURS  CPAP Q HS

## 2016-03-07 NOTE — Patient Instructions (Signed)
Shortness of Breath (Dyspnea)  Shortness of breath is the feeling that you can't catch your breath or get enough air. It is also known as dyspnea.  Dyspnea can be caused by many different conditions. They include:   Acute asthma attack.   Worsening of chronic lung diseases such as chronic bronchitis and emphysema.   Heart failure. This is when weak heart muscle allows extra fluid to collect in the lungs.   Panic attacks or anxiety. Fear can cause rapid breathing (hyperventilation).   Pneumonia, or an infection in the lung tissue.   Exposure to toxic substances, fumes, smoke, or certain medicines.   Blood clot in the lung (pulmonary embolism). This is often from a piece of blood clot in a deep vein of the leg (deep vein thrombosis) that breaks off and travels to the lungs.   Heart attack or heart-related chest pain (angina).   Anemia.   Collapsed lung (pneumothorax).   Dehydration.   Pregnancy.  Based on your visit today, the exact cause of your shortness of breath is not certain. Your tests don't show any of the serious causes of dyspnea. You may need other tests to find out if you have a serious problem. It's important to watch for any new symptoms or symptoms that get worse. Follow up with your healthcare provider as directed.  Home care  Follow these tips to take care of yourself at home:   When your symptoms are better, go back to your usual activities.   If you smoke, you should stop. Join a quit-smoking program or ask your healthcare provider for help.   Eat a healthy diet and get plenty of sleep.   Get regular exercise. Talk with your healthcare provider before starting to exercise, especially if you have other medical problems.   Cut down on the amount of caffeine and stimulants you consume.  Follow-up care  Follow up with your healthcare provider, or as advised.  If tests were done, you will be told if your treatment needs to be changed. You can call as directed for the results.  (Note:  If an X-ray was taken, a specialist will review it. You will be notified of any new findings that may affect your care.)  Call 911 or get immediate medical care  Shortness of breath may be a sign of a serious medical problem. For example, it may be a problem with your heart or lungs. Call 911 if you have worsening shortness of breath or trouble breathing, especially with any of the symptoms below:   You are confused or it's difficult to wake you.   You faint or lose consciousness.   You have a fast heartbeat, or your heartbeat is irregular.   You are coughing up blood.   You have pain in your chest, arm, shoulder, neck, or upper back.   You break out in a sweat.  When to seek medical advice  Call your healthcare provider right away if any of these occur:   Slight shortness of breath or wheezing   Redness, pain or swelling in your leg, arm, or other body area   Swelling in both legs or ankles   Fast weight gain   Dizziness or weakness   Fever of 100.4F (38C) or higher, or as directed by your healthcare provider  Date Last Reviewed: 08/20/2014   2000-2016 [employer] [address], [place] [address]. All rights reserved. This information is not intended as a substitute for professional medical   care. Always follow your healthcare professional's instructions.

## 2016-03-07 NOTE — ED Notes (Signed)
Pt denies having firearms or weapons.

## 2016-03-07 NOTE — Plan of Care (Signed)
[id number] Program    Education about New Medications and their Side effects    Dear Bethany Howell,    Its been a pleasure taking care of you during your hospitalization here at [facility]. We have initiated a new program to educate our patients and/or their family members or designated personnel about the new medications started by your physicians and their indications along with the possible side effects. Multiple studies have shown that patients started on new medications are often unaware of the names of the medication along with the indications and their side effects which leads to decreased compliance with the medications.    During our conversation today on 03/07/2016  8:12 PM I have explained to you the name of the new medication and the indication along with some possible common side effects. Listed below are some of the new medications started during this hospitalization.     Please call the Nurse if you have any side effects while in hospital.     Please call 911 if you have any life threatening symptoms after you are discharged from the hospital.    Please inform your Primary care physician for common side effects which are not life threatening after discharge.    Albuterol/Ipratropium (Duoneb) Asthma, COPD Headache, cough, nervousness, upper airway infection       Thank you for your time.    Bethany Howell Michaele Offer, [place]  03/07/2016  8:12 PM  [facility]  [address]  [place], [place]  [address]

## 2016-03-07 NOTE — ED Provider Notes (Signed)
Physician/Midlevel provider first contact with patient: 03/07/16 1249         History     Chief Complaint   Patient presents with   . Shortness of Breath     HPI Comments: 80 year old female presents with her daughter 1-2 week history of increasing shortness of breath and wheezing.  She sees Dr. Eben Burow who has been giving her diuretics and wanted her to get a ultrasound of the lower extremities and react to go.  However, over the past 2 days.  She has been more short of breath and has a low-grade fever.  She went to urgent care who sent her here because of her oxygen saturation being low.  She denies any headache, dizziness, blurred vision, double vision, neck pain, chest pain or back pain.  She denies any abdominal pain, weakness, numbness or tingling.  Patient has a pacemaker and has a history of cardiomyopathy and congestive heart failure.    Nothing makes it better or in any activity makes it worse.    The history is provided by the patient.            Past Medical History   Diagnosis Date   . Hypertension    . Cardiomyopathy    . Atrial fibrillation      with pacer    . Arthritis    . Hypercholesterolemia    . Basal cell carcinoma    . Congestive heart failure        Past Surgical History   Procedure Laterality Date   . Hysterectomy     . Replacement total knee     . Colonoscopy N/A 04/06/2015     Procedure: COLONOSCOPY;  Surgeon: Nani Skillern, MD;  Location: [facility];  Service: Gastroenterology;  Laterality: N/A;  w/ polypectomy   . Cardiac pacemaker placement         No family history on file.    Social  Social History   Substance Use Topics   . Smoking status: Never Smoker    . Smokeless tobacco: None   . Alcohol Use: No       .     Allergies   Allergen Reactions   . Codeine        Home Medications                   albuterol (PROVENTIL HFA;VENTOLIN HFA) 108 (90 BASE) MCG/ACT inhaler     Inhale 2 puffs into the lungs.     albuterol-ipratropium (DUO-NEB) 2.5-0.5(3) mg/3 mL nebulizer 3 mL  (Expired)     3 mL, Nebulization, RT - Once, Fri 03/07/16 at 1145, For 1 dose     atorvastatin (LIPITOR) 10 MG tablet     Take 10 mg by mouth daily.     Biotin 1000 MCG tablet     Take 1,000 mcg by mouth 3 (three) times daily.     budesonide-formoterol (SYMBICORT) 160-4.5 MCG/ACT inhaler     Inhale 2 puffs into the lungs 2 (two) times daily.     Cholecalciferol (VITAMIN D-3 PO)     Take by mouth.     diazePAM (VALIUM) 5 MG tablet     Take 5 mg by mouth every 6 (six) hours as needed (vaginal suppository for spasms).     digoxin (LANOXIN) 0.125 MG tablet     Take 1 tablet (125 mcg total) by mouth daily.     furosemide (LASIX) 20 MG tablet     Take  40 mg by mouth daily.        gabapentin (NEURONTIN) 100 MG capsule     Take 1 capsule (100 mg total) by mouth 3 (three) times daily.     HYDROcodone-acetaminophen (NORCO) 5-325 MG per tablet     Take 1 tablet by mouth every 6 (six) hours as needed for Pain.     losartan (COZAAR) 25 MG tablet     Take 50 mg by mouth daily.        metoprolol XL (TOPROL-XL) 50 MG 24 hr tablet     Take 50 mg by mouth 2 (two) times daily.     Potassium (POTASSIMIN PO)     Take by mouth.     rivaroxaban (XARELTO) 20 MG Tab     Take 20 mg by mouth daily with dinner.                                         Review of Systems   Constitutional: Positive for fever and fatigue. Negative for diaphoresis.   HENT: Negative for congestion, ear pain and sore throat.    Eyes: Negative for pain and visual disturbance.   Respiratory: Positive for cough, chest tightness and shortness of breath. Negative for wheezing.    Cardiovascular: Positive for leg swelling. Negative for chest pain and palpitations.   Gastrointestinal: Negative for nausea, vomiting, abdominal pain, diarrhea and constipation.   Genitourinary: Negative for dysuria and urgency.   Musculoskeletal: Negative for back pain, gait problem and neck pain.   Skin: Negative for color change and rash.   Neurological: Negative for dizziness, weakness and  headaches.   Psychiatric/Behavioral: Negative for confusion. The patient is not nervous/anxious.        Physical Exam    BP: 132/75 mmHg, Heart Rate: 78, Temp: 99.2 F (37.3 C), Resp Rate: 20, SpO2: 94 %, Weight: 101.152 kg    Physical Exam   Constitutional: She is oriented to person, place, and time. She appears well-developed and well-nourished. She appears distressed.   HENT:   Head: Normocephalic and atraumatic.   Right Ear: External ear normal.   Left Ear: External ear normal.   Nose: Nose normal.   Mouth/Throat: Oropharynx is clear and moist. No oropharyngeal exudate.   Eyes: Conjunctivae and EOM are normal. Pupils are equal, round, and reactive to light. Right eye exhibits no discharge. Left eye exhibits no discharge. No scleral icterus.   Neck: Normal range of motion. Neck supple. No JVD present. No tracheal deviation present.   Cardiovascular: Normal rate and regular rhythm.    Murmur heard.  Pulmonary/Chest: Effort normal. She has wheezes. She has rales. She exhibits no tenderness.   Abdominal: Soft. Bowel sounds are normal. She exhibits no distension. There is no tenderness. There is no guarding.   Musculoskeletal: She exhibits edema. She exhibits no tenderness.   Neurological: She is alert and oriented to person, place, and time. She has normal strength. Coordination normal. GCS eye subscore is 4. GCS verbal subscore is 5. GCS motor subscore is 6.   Skin: Skin is warm and dry. She is not diaphoretic. No pallor.   Psychiatric: She has a normal mood and affect. Her behavior is normal. Judgment normal.   Nursing note and vitals reviewed.        MDM and ED Course     ED Medication Orders     Start Ordered  Status Ordering Provider    03/07/16 1439 03/07/16 1438  furosemide (LASIX) injection 40 mg   Once     Route: Intravenous  Ordered Dose: 40 mg     Last MAR action:  Given Freddy Kinne    03/07/16 1422 03/07/16 1421  levoFLOXacin (LEVAQUIN) 500mg  in D5W IVPB (premix)   Once     Route: Intravenous   Ordered Dose: 500 mg     Last MAR action:  New Bag Marilu Rylander    03/07/16 1311 03/07/16 1311  albuterol (PROVENTIL) nebulizer solution 2.5 mg   RT - Once     Route: Nebulization  Ordered Dose: 2.5 mg     Last MAR action:  Given Adianna Darwin    03/07/16 1311 03/07/16 1311  ipratropium (ATROVENT) 0.02 % nebulizer solution 0.5 mg   RT - Once     Route: Nebulization  Ordered Dose: 0.5 mg     Last MAR action:  Given Yuki Purves             MDM  Number of Diagnoses or Management Options  Bilateral lower extremity edema:   Congestive heart failure, unspecified congestive heart failure chronicity, unspecified congestive heart failure type:   Pneumonia of left lower lobe due to infectious organism:   Wheezing:   Diagnosis management comments: Oxygen saturation by pulse oximetry is 91%-94%, Low Normal.  Interventions: As per minute by nasal cannula.      EKG Interpretation    EKG interpreted by Melvern Sample, DO  Time: 12:17 PM  Rate: Paced  Rhythm: Ventricular paced rhythm  Axis: Left  ST Segments ant T waves: No acute ST or T-wave changes.  Change in focus of the conduction.    Conduction: LBBB (Complete)  Impression: Non-specific EKG    Diff Dx: pneumonia, asthma, bronchitis, congestive heart failure, coronary disease, MI, influenza.    Multiple reexaminations: Patient feeling better with treatment.  Due to the patient's pneumonia and congestive heart failure.  I felt she needed admission.  Ultrasound lower shoulder is negative.  Patient will need a cardiac echo in the hospital.    This patient is at risk for decompensation due to pneumonia with congestive heart failure and is at risk of death or permanent injury. This patient will need to be admitted to the hospital. This patient is expected to have a >2 midnight stay in the hospital.    Dr. Barbette Hair will accept.     I have reviewed the nursing history.    I have reviewed all available xrays and find the following results: Chest x-ray with left lower lobe pneumonia    DR.  Melvern Sample  is the primary attending for this patient and has obtained and performed the history, PE, and medical decision making for this patient.      Results     Procedure Component Value Units Date/Time    B-type Natriuretic Peptide (BNP) ([id number])  (Abnormal) Collected:    03/07/16 1318    Specimen Information:  Blood Updated:  03/07/16 1419     B-Natriuretic Peptide 334.8 (H) pg/mL     Narrative:      Reason for not entering the date/time of last dose:->Not  Applicable-date/time entered above    Digoxin level ([id number])  (Abnormal) Collected:  03/07/16 1318     Digoxin Level 0.37 (L) ng/mL Updated:  03/07/16 1402     Digoxin Date of Last Dose 03/07/2016      Digoxin Time of Last Dose Unknown  Troponin I ([id number]) Collected:  03/07/16 1318    Specimen Information:  Blood Updated:  03/07/16 1401     Troponin I <0.01 ng/mL     Manual Differential ([id number])  (Abnormal) Collected:  03/07/16 1318     Segmented Neutrophils 63 % Updated:  03/07/16 1358     Band Neutrophils 6 %      Lymphocytes Manual 18 %      Monocytes Manual 12 %      Eosinophils Manual 0 %      Basophils Manual 0 %      Atypical Lymph % 1 %      Nucleated RBC 0 /100 WBC      Abs Seg Manual 2.68 x10 3/uL      Bands Absolute 0.26 x10 3/uL      Absolute Lymph Manual 0.77 x10 3/uL      Monocytes Absolute 0.51 x10 3/uL      Absolute Eos Manual 0.00 x10 3/uL      Absolute Baso Manual 0.00 x10 3/uL      Atypical Lymph Absolute 0.04 (H) x10 3/uL     Cell MorpHology ([id number])  (Abnormal) Collected:  03/07/16 1318     Cell Morphology: Abnormal (A) Updated:  03/07/16 1358     Anisocytosis =1+ (A)      Hypochromia =1+ (A)      Ovalocytes =1+ (A)      Platelet Estimate Decreased (A)     CBC with differential ([id number])  (Abnormal) Collected:  03/07/16 1318    Specimen Information:  Blood from Blood Updated:  03/07/16 1358     WBC 4.26 x10 3/uL      Hgb 10.7 (L) g/dL      Hematocrit 75.6 (L) %      Platelets 130 (L) x10 3/uL      RBC 3.59 (L) x10  6/uL      MCV 95.0 fL      MCH 29.8 pg      MCHC 31.4 (L) g/dL      RDW 17 (H) %      MPV 10.0 fL     Comprehensive metabolic panel ([id number])  (Abnormal) Collected:    03/07/16 1318    Specimen Information:  Blood Updated:  03/07/16 1351     Glucose 85 mg/dL      BUN 41.6 mg/dL      Creatinine 0.8 mg/dL      Sodium 606 mEq/L      Potassium 3.9 mEq/L      Chloride 107 mEq/L      CO2 23 mEq/L      Calcium 8.7 mg/dL      Protein, Total 7.4 g/dL      Albumin 3.9 g/dL      AST (SGOT) 38 (H) U/L      ALT 17 U/L      Alkaline Phosphatase 133 (H) U/L      Bilirubin, Total 1.6 (H) mg/dL      Globulin 3.5 g/dL      Albumin/Globulin Ratio 1.1      Anion Gap 9.0     GFR ([id number]) Collected:  03/07/16 1318     EGFR >60.0 Updated:  03/07/16 1351    PT/APTT ([id number])  (Abnormal) Collected:  03/07/16 1318     PT 18.0 (H) sec Updated:  03/07/16 1348     PT INR 1.5      PT Anticoag. Given Within 48 hrs. rivaroxaban (Xa  PTT 37 sec         Radiology Results (24 Hour)     Procedure Component Value Units Date/Time    US Venous Duplex Doppler Leg Bilateral ([id number]) Collected:  03/07/16   1518    Order Status:  Completed Updated:  03/07/16 1523    Narrative:      HISTORY:  Bilateral leg swelling.    COMPARISON:  03/31/2015    FINDINGS: Evaluation was somewhat limited due to swelling, especially of  the calf veins. Deep veins from the bilateral common femoral to  popliteal trifurcation demonstrate normal compressibility, color and  Doppler flow, and augmentation.  The origin of the profunda femoral vein  and visualized calf veins also appear patent.     Impression:       No evidence for DVT in the lower extremities bilaterally.    Anne Hahn, MD   03/07/2016 3:18 PM     Chest 2 Views ([id number]) Collected:  03/07/16 1351    Order Status:  Completed Updated:  03/07/16 1357    Narrative:      HISTORY: Chest pain    COMPARISON: 03/31/2015    FINDINGS:  PA and lateral views of the chest were obtained. There are focal  airspace  opacities at the left lung base. Stable cardiomegaly. Possible  trace left pleural effusion. No appreciable pneumothorax. Stable  appearance of left-sided pacemaker with leads ending in the expected  location of the right atrium and right ventricle.     Impression:       Findings concerning for left lower lobe pneumonia. Possible  trace left pleural effusion. Follow-up x-ray is recommended to ensure  resolution.    Anne Hahn, MD   03/07/2016 1:53 PM         *This note was generated by the Epic EMR system/ Dragon speech recognition and may contain inherent errors or omissions not intended by the user. Grammatical errors, random word insertions, deletions, pronoun errors and incomplete sentences are occasional consequences of this technology due to software limitations. Not all errors are caught or corrected. If there are questions or concerns about the content of this note or information contained within the body of this dictation they should be addressed directly with the author for clarification             Amount and/or Complexity of Data Reviewed  Clinical lab tests: ordered and reviewed  Tests in the radiology section of CPT: ordered and reviewed  Tests in the medicine section of CPT: ordered and reviewed          Procedures    Clinical Impression & Disposition     Clinical Impression  Final diagnoses:   Pneumonia of left lower lobe due to infectious organism   Congestive heart failure, unspecified congestive heart failure chronicity, unspecified congestive heart failure type   Wheezing   Bilateral lower extremity edema        ED Disposition     Admit Admitting Physician: ADI Viona Gilmore [[id number]]  Diagnosis: Left lower lobe pneumonia [132440]  Estimated Length of Stay: > or = to 2 midnights  Tentative Discharge Plan?: Home or Self Care [1]  Patient Class: Inpatient [101]             New Prescriptions    No medications on file                   Melvern Sample, DO  03/07/16 1614

## 2016-03-07 NOTE — Progress Notes (Signed)
03/07/16 2042   Patient Type   Within 30 Days of Previous Admission? No   Medicare focused diagnosis patient? Pneumonia   Bundle patient? Not a bundle patient   Healthcare Decisions   Interviewed: Patient;Family   Interviewee Contact Information: BARBOUR,[phone]   Orientation/Decision Making Abilities of Patient Alert and Oriented x3, able to make decisions   Advance Directive Patient does not have advance directive   Advance Directive not in Chart Copy requested from family/decision maker  (Provided Right to decide booklet)   Additional Emergency Contacts? patient lives with her daughter at [address] [place] [address]. She lives part of the year in her condo in [place]   Prior to admission   Prior level of function Independent with ADLs;Ambulates with assistive device   Type of Residence Private residence   Home Layout Multi-level  (patient lives in private basement apt. )   Have running water, electricity, heat, etc? Yes   How do you get to your MD appointments? daughter drives her   Dressing Independent   Grooming Independent   Feeding Independent   Bathing Independent   Toileting Independent   DME Currently at [employer] wheel walker   Discharge Planning   Support Systems Children   Anticipated [place] plan discussed with: Same as interviewed   [place] discussion contact information: BARBOUR,[phone]   Follow up appointment scheduled? No  (patient does not have a PCP here but is seen as needed in [facility] [facility] on [address])   Reason no follow up scheduled? (Pt order for transition clinic completed but per the daughter they are under the impression they may be ineligible to be followed there (too many prior visits). Encouraged them to see up f/u apt w/ Dr. Nedra Hai who see's pts daughter [facility])   Mode of transportation: Private car (family member)   Consults/Providers   PT Evaluation Needed 1   OT Evalulation Needed 1   Important Message from Medicare Notice   Patient  received 1st IMM Letter? Yes   Date of most recent IMM given: 03/07/16

## 2016-03-07 NOTE — Progress Notes (Signed)
[facility]    PROGRESS NOTE      Patient: Bethany Howell   Date: 03/07/2016   MRN: [id number]     Past Medical History   Diagnosis Date   . Hypertension    . Cardiomyopathy    . Atrial fibrillation      with pacer    . Arthritis    . Hypercholesterolemia    . Basal cell carcinoma      Social History     Social History   . Marital Status: Widowed     Spouse Name: N/A   . Number of Children: N/A   . Years of Education: N/A     Occupational History   . Not on file.     Social History Main Topics   . Smoking status: Never Smoker    . Smokeless tobacco: Not on file   . Alcohol Use: No   . Drug Use: No   . Sexual Activity: Not on file     Other Topics Concern   . Not on file     Social History Narrative     History reviewed. No pertinent family history.    ASSESSMENT/PLAN     Bethany Howell is a 80 y.o. female    Chief Complaint   Patient presents with   . Shortness of Breath        1. Wheezing  - albuterol-ipratropium (DUO-NEB) 2.5-0.5(3) mg/3 mL nebulizer 3 mL; Take 3 mLs by nebulization one time.      2. SOB (shortness of breath)  Duo neb given - worsening of symptoms after Rx pulse ox 92% (decreased from 94%) and no change in wheezing and no change in SOB. Lip color slightly cyanotic. Increased edema legs and face. Advised to go to ED- daughter willing and wanting to drive her- this is ok as long as they go directly to ED. Called [facility] and gave a report on pt  to [place] (Press photographer).       Results for orders placed or performed in visit on 04/24/15   Basic Metabolic Panel   Result Value Ref Range    Glucose 86 65 - 99 mg/dL    BUN 13 8 - 27 mg/dL    Creatinine 5.40 (L) 0.57 - 1.00 mg/dL    EGFR 90 >98 JX/BJY/7.82    EGFR 104 >59 mL/min/1.73    BUN/Creatinine Ratio 23 11 - 26    Sodium 142 134 - 144 mmol/L    Potassium 4.0 3.5 - 5.2 mmol/L    Chloride 101 97 - 108 mmol/L    CO2 21 18 - 29 mmol/L    Calcium 9.0 8.7 - 10.3 mg/dL   Digoxin level   Result Value Ref Range    Digoxin Level 0.7 (L) 0.9 -  2.0 ng/mL       Risk & Benefits of the new medication(s) were explained to the patient (and family) who verbalized understanding & agreed to the treatment plan. Patient (family) encouraged to contact me/clinical staff with any questions/concerns      MEDICATIONS     Current Outpatient Prescriptions   Medication Sig Dispense Refill   . albuterol (PROVENTIL HFA;VENTOLIN HFA) 108 (90 BASE) MCG/ACT inhaler Inhale 2 puffs into the lungs.     Marland Kitchen atorvastatin (LIPITOR) 10 MG tablet Take 10 mg by mouth daily.     . Biotin 1000 MCG tablet Take 1,000 mcg by mouth 3 (three) times daily.     Marland Kitchen  budesonide-formoterol (SYMBICORT) 160-4.5 MCG/ACT inhaler Inhale 2 puffs into the lungs 2 (two) times daily.     . Cholecalciferol (VITAMIN D-3 PO) Take by mouth.     . diazePAM (VALIUM) 5 MG tablet Take 5 mg by mouth every 6 (six) hours as needed (vaginal suppository for spasms).     . digoxin (LANOXIN) 0.125 MG tablet Take 1 tablet (125 mcg total) by mouth daily. 90 tablet 0   . furosemide (LASIX) 20 MG tablet Take 20 mg by mouth 2 (two) times daily.     Marland Kitchen gabapentin (NEURONTIN) 100 MG capsule Take 1 capsule (100 mg total) by mouth 3 (three) times daily. 90 capsule 3   . HYDROcodone-acetaminophen (NORCO) 5-325 MG per tablet Take 1 tablet by mouth every 6 (six) hours as needed for Pain.     Marland Kitchen losartan (COZAAR) 25 MG tablet Take 50 mg by mouth daily.        . metoprolol XL (TOPROL-XL) 50 MG 24 hr tablet Take 50 mg by mouth 2 (two) times daily.     . Potassium (POTASSIMIN PO) Take by mouth.     . rivaroxaban (XARELTO) 20 MG Tab Take 20 mg by mouth daily with dinner.       No current facility-administered medications for this visit.       Allergies   Allergen Reactions   . Codeine        SUBJECTIVE     Chief Complaint   Patient presents with   . Shortness of Breath        Shortness of Breath  This is a chronic problem. Episode onset: started getting bad on Wednesday. The problem occurs constantly. The problem has been gradually worsening.  Associated symptoms include leg swelling and wheezing. Pertinent negatives include no chest pain. Fever: low grade fever now.       ROS     Review of Systems   Constitutional: Negative for chills and diaphoresis. Fever: low grade fever now.   HENT: Negative for congestion.    Respiratory: Positive for shortness of breath and wheezing. Negative for cough and chest tightness.    Cardiovascular: Positive for leg swelling. Negative for chest pain.   Skin: Color change: lips slightly cyanotic.   Neurological: Negative for dizziness and speech difficulty.   Hematological: Does not bruise/bleed easily.   Psychiatric/Behavioral: Negative.        The following portions of the patient's history were reviewed and updated as appropriate: Allergies, Current Medications, Past Family History, Past Medical history, Past social history, Past surgical history, and Problem List.    PHYSICAL EXAM     Filed Vitals:    03/07/16 1052   BP: 162/74   Pulse: 83   Temp: 100.4 F (38 C)   Resp: 18   Height: 1.626 m (5\' 4" )   Weight: 102.059 kg (225 lb)   SpO2: 94%       Physical Exam   Constitutional: She is oriented to person, place, and time. She appears well-developed and well-nourished.   Neck: Normal range of motion. Neck supple.   Cardiovascular: Normal rate, regular rhythm, S1 normal, S2 normal, normal heart sounds and normal pulses.  PMI is not displaced.    Pulmonary/Chest: No accessory muscle usage. She is in respiratory distress. She has decreased breath sounds in the left lower field. She has wheezes. She has no rhonchi. She has no rales. She exhibits no tenderness.   Abdominal: Soft.   Neurological: She is alert and oriented to  person, place, and time.   Skin: Skin is warm and dry. No rash noted. She is not diaphoretic.   Psychiatric: She has a normal mood and affect. Her behavior is normal.     Ortho Exam  Neurologic Exam     Mental Status   Oriented to person, place, and time.       PROCEDURE(S)      Procedures        Signed,  Solon Palm, FNP  03/07/2016

## 2016-03-07 NOTE — H&P (Signed)
[facility] HOSPITALIST  H&P  Patient Info:   Date/Time: 03/07/2016 / 5:10 PM   Admit Date:03/07/2016  Patient Name:Bethany Howell   ZOX:[id number]   PCP: Christa See, MD  Attending Physician:Kugler, Vonna Kotyk, DO     Assessment and Plan:    Left lower lobe community-acquired pneumonia- will start IV Rocephin and Zithromax.  Obtain urine Legionella and strep antigen.  O2 support.   Exertional dyspnea with Hypoxia secondary to acute bronchospasm- will start IV Solu-Medrol 40 mg every 12 hours, scheduled DuoNeb every 6 hours, Singulair and Breo Ellipta.  O2 support.    Bilateral lower extremity edema- lower extremity Dopplers negative for deep vein thrombosis.  start IV Lasix 40 mg daily.  Obtain cardiology consult with [place] heart Monitor I's and O's.  Obtain nutrition consult for low sodium diet recommendations.     History of atrial fibrillation, status post permanent pacemaker placement-continue xarelto for anticoagulation.   History of cardiomyopathy, moderate MR and severe TR   Anemia- likely chronic.  Will monitor   Thrombocytopenia-will monitor   Hyperlipidemia-continue statin    Discussed the plan with patient's daughter at bedside    DVT Prohylaxis: Xarelto   Code Status: Full  Disposition: Home when stable  Type of Admission:Inpatient  Estimated Length of Stay (including stay in the ER receiving treatment): >2 MN  Medical Necessity for stay: Community-acquired pneumonia, acute bronchospasm, hypoxia  Hospital Problems:   Principal Problem:    Left lower lobe pneumonia  Active Problems:    HTN (hypertension)    Cardiomyopathy    Atrial fibrillation    HLD (hyperlipidemia)    Dyspnea on exertion    Hypoxia    Bilateral edema of lower extremity    Clinical Presentation:   History of Presenting Illness:   Bethany Howell is a 80 y.o. female who has history of   Past Surgical History   Procedure Laterality Date   . Hysterectomy     . Replacement total knee     . Colonoscopy N/A 04/06/2015      Procedure: COLONOSCOPY;  Surgeon: Nani Skillern, MD;  Location: [facility];  Service: Gastroenterology;  Laterality: N/A;  w/ polypectomy   . Cardiac pacemaker placement      Past Medical History   Diagnosis Date   . Hypertension    . Cardiomyopathy    . Atrial fibrillation      with pacer    . Arthritis    . Hypercholesterolemia    . Basal cell carcinoma    . Congestive heart failure     came with the chief complaint of Shortness of Breath   patient with known history of chronic atrial fibrillation, cardiomyopathy, status post pacemaker placement, history of bronchitis who presents to the emergency room with shortness of breath for 2 weeks, gradually worsening within the past 2-3 days.  She also reports having cough.  Denies high-grade fever.  Initially she was short of breath with walking a few steps , but later on she felt short of breath even at rest.  Patient also has been noticing worsening lower extremity edema.  She was seen by cardiology, Dr. Eben Burow few weeks ago, recommended to obtain lower extremity Dopplers and echocardiogram.  Patient was seen at urgent care center this morning and referred to the emergency room for hypoxia.  In the emergency room, Chest x-ray showed left lower lobe pneumonia.   She received nebulizer, IV Lasix and IV Levaquin in the emergency room with some improvement in her  symptoms.    Review of Systems:   Review of Systems   Constitutional: Negative for fever, chills, weight loss and malaise/fatigue.   HENT: Negative for hearing loss.    Eyes: Negative for blurred vision, double vision, photophobia and pain.   Respiratory: Positive for cough, shortness of breath and wheezing. Negative for hemoptysis and sputum production.    Cardiovascular: Positive for leg swelling and PND. Negative for chest pain, palpitations and orthopnea.   Gastrointestinal: Negative for heartburn, nausea, vomiting, abdominal pain, diarrhea, constipation and blood in stool.   Genitourinary:  Negative for dysuria, urgency, frequency, hematuria and flank pain.   Musculoskeletal: Negative for myalgias, back pain, joint pain, falls and neck pain.   Skin: Negative for itching and rash.   Neurological: Negative for dizziness, tingling, tremors, sensory change, speech change, focal weakness, seizures and headaches.   Psychiatric/Behavioral: Negative for depression, suicidal ideas, hallucinations, memory loss and substance abuse. The patient is not nervous/anxious.      Physical Exam:     Filed Vitals:    03/07/16 1220 03/07/16 1358 03/07/16 1615 03/07/16 1645   BP:  132/75 171/83 169/81   Pulse: 78 78 74 78   Temp: 99.2 F (37.3 C)      TempSrc: Temporal Artery      Resp: 20 18 18 18    Height: 1.626 m (5\' 4" )      Weight: 101.152 kg (223 lb)      SpO2: 94% 97% 97% 96%     Physical Exam   Constitutional: She is oriented to person, place, and time and well-developed, well-nourished, and in no distress. No distress.   HENT:   Head: Normocephalic and atraumatic.   Right Ear: External ear normal.   Left Ear: External ear normal.   Eyes: Conjunctivae and EOM are normal. Pupils are equal, round, and reactive to light. Right eye exhibits no discharge. Left eye exhibits no discharge. No scleral icterus.   Neck: No JVD present. No tracheal deviation present. No thyromegaly present.   Cardiovascular: Normal rate and regular rhythm.  Exam reveals no gallop and no friction rub.    Murmur heard.  Pulmonary/Chest: Effort normal. No stridor. No respiratory distress. She has wheezes. She has no rales. She exhibits no tenderness.   Musculoskeletal: She exhibits edema. She exhibits no tenderness.   R>L LE edema   Lymphadenopathy:     She has no cervical adenopathy.   Neurological: She is alert and oriented to person, place, and time. No cranial nerve deficit.   Skin: Skin is dry. No rash noted. She is not diaphoretic. No erythema. There is pallor.   Psychiatric: Memory, affect and judgment normal.     Clinical Information and  History:   Chief Complaint:  Chief Complaint   Patient presents with   . Shortness of Breath     Past Medical History:  Past Medical History   Diagnosis Date   . Hypertension    . Cardiomyopathy    . Atrial fibrillation      with pacer    . Arthritis    . Hypercholesterolemia    . Basal cell carcinoma    . Congestive heart failure      Past Surgical History:  Past Surgical History   Procedure Laterality Date   . Hysterectomy     . Replacement total knee     . Colonoscopy N/A 04/06/2015     Procedure: COLONOSCOPY;  Surgeon: Nani Skillern, MD;  Location: [facility];  Service: Gastroenterology;  Laterality: N/A;  w/ polypectomy   . Cardiac pacemaker placement       Family History: Reviewed.  No pertinent family history  Social History:  History   Alcohol Use No     History   Drug Use No     History   Smoking status   . Never Smoker    Smokeless tobacco   . Not on file     Social History     Social History   . Marital Status: Widowed     Spouse Name: N/A   . Number of Children: N/A   . Years of Education: N/A     Social History Main Topics   . Smoking status: Never Smoker    . Smokeless tobacco: None   . Alcohol Use: No   . Drug Use: No   . Sexual Activity: Not Asked     Other Topics Concern   . None     Social History Narrative     Allergies:  Allergies   Allergen Reactions   . Codeine      Medications:  Current/Home Medications    ALBUTEROL (PROVENTIL HFA;VENTOLIN HFA) 108 (90 BASE) MCG/ACT INHALER    Inhale 2 puffs into the lungs.    ATORVASTATIN (LIPITOR) 10 MG TABLET    Take 10 mg by mouth daily.    BUDESONIDE-FORMOTEROL (SYMBICORT) 160-4.5 MCG/ACT INHALER    Inhale 2 puffs into the lungs 2 (two) times daily.    CHOLECALCIFEROL (VITAMIN D-3 PO)    Take 1,000 mg by mouth daily.       DIAZEPAM (VALIUM) 5 MG TABLET    Take 5 mg by mouth every 6 (six) hours as needed (vaginal suppository for spasms).    DIGOXIN (LANOXIN) 0.125 MG TABLET    Take 1 tablet (125 mcg total) by mouth daily.    FUROSEMIDE  (LASIX) 40 MG TABLET    Take 40 mg by mouth 2 (two) times daily.    GABAPENTIN (NEURONTIN) 100 MG CAPSULE    Take 1 capsule (100 mg total) by mouth 3 (three) times daily.    LOSARTAN (COZAAR) 25 MG TABLET    Take 25 mg by mouth daily.       METOPROLOL XL (TOPROL-XL) 50 MG 24 HR TABLET    Take 50 mg by mouth 2 (two) times daily.    POTASSIUM (POTASSIMIN PO)    Take 10 mEq by mouth daily.       RIVAROXABAN (XARELTO) 20 MG TAB    Take 20 mg by mouth daily with dinner.         Results of Labs/imaging   Labs have been reviewed:   Coagulation Profile:   Recent Labs  Lab 03/07/16  1318   PT 18.0*   PT INR 1.5   PTT 37       CBC review:   Recent Labs  Lab 03/07/16  1318   WBC 4.26   HGB 10.7*   HEMATOCRIT 34.1*   PLATELETS 130*   MCV 95.0   RDW 17*   SEGMENTED NEUTROPHILS 63     Chem Review:  Recent Labs  Lab 03/07/16  1318   SODIUM 139   POTASSIUM 3.9   CHLORIDE 107   CO2 23   BUN 12.0   CREATININE 0.8   GLUCOSE 85   CALCIUM 8.7   BILIRUBIN, TOTAL 1.6*   AST (SGOT) 38*   ALT 17   ALKALINE PHOSPHATASE 133*  Results     Procedure Component Value Units Date/Time    B-type Natriuretic Peptide (BNP) [[id number]]  (Abnormal) Collected:  03/07/16 1318    Specimen Information:  Blood Updated:  03/07/16 1419     B-Natriuretic Peptide 334.8 (H) pg/mL     Narrative:      Reason for not entering the date/time of last dose:->Not  Applicable-date/time entered above    Digoxin level [[id number]]  (Abnormal) Collected:  03/07/16 1318     Digoxin Level 0.37 (L) ng/mL Updated:  03/07/16 1402     Digoxin Date of Last Dose 03/07/2016      Digoxin Time of Last Dose Unknown     Troponin I [[id number]] Collected:  03/07/16 1318    Specimen Information:  Blood Updated:  03/07/16 1401     Troponin I <0.01 ng/mL     Manual Differential [[id number]]  (Abnormal) Collected:  03/07/16 1318     Segmented Neutrophils 63 % Updated:  03/07/16 1358     Band Neutrophils 6 %      Lymphocytes Manual 18 %      Monocytes Manual 12 %      Eosinophils Manual 0 %       Basophils Manual 0 %      Atypical Lymph % 1 %      Nucleated RBC 0 /100 WBC      Abs Seg Manual 2.68 x10 3/uL      Bands Absolute 0.26 x10 3/uL      Absolute Lymph Manual 0.77 x10 3/uL      Monocytes Absolute 0.51 x10 3/uL      Absolute Eos Manual 0.00 x10 3/uL      Absolute Baso Manual 0.00 x10 3/uL      Atypical Lymph Absolute 0.04 (H) x10 3/uL     Cell MorpHology [[id number]]  (Abnormal) Collected:  03/07/16 1318     Cell Morphology: Abnormal (A) Updated:  03/07/16 1358     Anisocytosis =1+ (A)      Hypochromia =1+ (A)      Ovalocytes =1+ (A)      Platelet Estimate Decreased (A)     CBC with differential [[id number]]  (Abnormal) Collected:  03/07/16 1318    Specimen Information:  Blood from Blood Updated:  03/07/16 1358     WBC 4.26 x10 3/uL      Hgb 10.7 (L) g/dL      Hematocrit 47.4 (L) %      Platelets 130 (L) x10 3/uL      RBC 3.59 (L) x10 6/uL      MCV 95.0 fL      MCH 29.8 pg      MCHC 31.4 (L) g/dL      RDW 17 (H) %      MPV 10.0 fL     Comprehensive metabolic panel [[id number]]  (Abnormal) Collected:  03/07/16 1318    Specimen Information:  Blood Updated:  03/07/16 1351     Glucose 85 mg/dL      BUN 64.3 mg/dL      Creatinine 0.8 mg/dL      Sodium 329 mEq/L      Potassium 3.9 mEq/L      Chloride 107 mEq/L      CO2 23 mEq/L      Calcium 8.7 mg/dL      Protein, Total 7.4 g/dL      Albumin 3.9 g/dL      AST (SGOT) 38 (H) U/L  ALT 17 U/L      Alkaline Phosphatase 133 (H) U/L      Bilirubin, Total 1.6 (H) mg/dL      Globulin 3.5 g/dL      Albumin/Globulin Ratio 1.1      Anion Gap 9.0     GFR [[id number]] Collected:  03/07/16 1318     EGFR >60.0 Updated:  03/07/16 1351    PT/APTT [[id number]]  (Abnormal) Collected:  03/07/16 1318     PT 18.0 (H) sec Updated:  03/07/16 1348     PT INR 1.5      PT Anticoag. Given Within 48 hrs. rivaroxaban (Xa      PTT 37 sec         Radiology reports have been reviewed:  Radiology Results (24 Hour)     Procedure Component Value Units Date/Time    US Venous Duplex Doppler Leg  Bilateral [[id number]] Collected:  03/07/16 1518    Order Status:  Completed Updated:  03/07/16 1523    Narrative:      HISTORY:  Bilateral leg swelling.    COMPARISON:  03/31/2015    FINDINGS: Evaluation was somewhat limited due to swelling, especially of  the calf veins. Deep veins from the bilateral common femoral to  popliteal trifurcation demonstrate normal compressibility, color and  Doppler flow, and augmentation.  The origin of the profunda femoral vein  and visualized calf veins also appear patent.      Impression:        No evidence for DVT in the lower extremities bilaterally.    Anne Hahn, MD   03/07/2016 3:18 PM      Chest 2 Views [[id number]] Collected:  03/07/16 1351    Order Status:  Completed Updated:  03/07/16 1357    Narrative:      HISTORY: Chest pain    COMPARISON: 03/31/2015    FINDINGS:  PA and lateral views of the chest were obtained. There are focal  airspace opacities at the left lung base. Stable cardiomegaly. Possible  trace left pleural effusion. No appreciable pneumothorax. Stable  appearance of left-sided pacemaker with leads ending in the expected  location of the right atrium and right ventricle.      Impression:       Findings concerning for left lower lobe pneumonia. Possible  trace left pleural effusion. Follow-up x-ray is recommended to ensure  resolution.    Anne Hahn, MD   03/07/2016 1:53 PM          EKG: EKG reviewed  Hospitalist   Signed by:   Julien Girt  03/07/2016 5:10 PM    *This note was generated by the Epic EMR system/ Dragon speech recognition and may contain inherent errors or omissions not intended by the user. Grammatical errors, random word insertions, deletions, pronoun errors and incomplete sentences are occasional consequences of this technology due to software limitations. Not all errors are caught or corrected. If there are questions or concerns about the content of this note or information contained within the body of this dictation they should be  addressed directly with the author for clarification

## 2016-03-08 DIAGNOSIS — I482 Chronic atrial fibrillation: Secondary | ICD-10-CM | POA: Diagnosis not present

## 2016-03-08 DIAGNOSIS — I429 Cardiomyopathy, unspecified: Secondary | ICD-10-CM | POA: Diagnosis not present

## 2016-03-08 DIAGNOSIS — R600 Localized edema: Secondary | ICD-10-CM | POA: Diagnosis not present

## 2016-03-08 DIAGNOSIS — J189 Pneumonia, unspecified organism: Secondary | ICD-10-CM | POA: Diagnosis not present

## 2016-03-08 LAB — CBC
Hematocrit: 34.3 % — ABNORMAL LOW (ref 37.0–47.0)
Hgb: 10.9 g/dL — ABNORMAL LOW (ref 12.0–16.0)
MCH: 29.7 pg (ref 28.0–32.0)
MCHC: 31.8 g/dL — ABNORMAL LOW (ref 32.0–36.0)
MCV: 93.5 fL (ref 80.0–100.0)
MPV: 10.3 fL (ref 9.4–12.3)
Platelets: 133 10*3/uL — ABNORMAL LOW (ref 140–400)
RBC: 3.67 10*6/uL — ABNORMAL LOW (ref 4.20–5.40)
RDW: 17 % — ABNORMAL HIGH (ref 12–15)
WBC: 3.37 10*3/uL — ABNORMAL LOW (ref 3.50–10.80)

## 2016-03-08 LAB — BASIC METABOLIC PANEL
Anion Gap: 9 (ref 5.0–15.0)
BUN: 13.2 mg/dL (ref 7.0–19.0)
CO2: 22 mEq/L (ref 22–29)
Calcium: 8.6 mg/dL (ref 7.9–10.2)
Chloride: 109 mEq/L (ref 100–111)
Creatinine: 0.7 mg/dL (ref 0.6–1.0)
Glucose: 124 mg/dL — ABNORMAL HIGH (ref 70–100)
Potassium: 3.8 mEq/L (ref 3.5–5.1)
Sodium: 140 mEq/L (ref 136–145)

## 2016-03-08 LAB — GFR: EGFR: >60

## 2016-03-08 NOTE — Progress Notes (Addendum)
[facility] HOSPITALIST  Progress Note  Patient Info:   Date/Time: 03/08/2016 / 3:37 PM   Admit Date:03/07/2016  Patient Name:Bethany Howell   ZOX:[id number]   PCP: Christa See, MD  Attending Physician:Adi Viona Gilmore, MD     Subjective:   03/08/2016   Feels better  Shortness of breath and cough improving  Denies pain  Chief Complaint:  Shortness of Breath  Update of Review of Systems:  Review of Systems   Constitutional: Negative for fever, chills, weight loss and malaise/fatigue.   HENT: Negative for hearing loss.    Eyes: Negative for blurred vision, double vision, photophobia and pain.   Respiratory: Negative for cough, hemoptysis and sputum production.         Improving shortness of breath   Cardiovascular: Negative for chest pain, palpitations and orthopnea.        Leg swelling, improving   Gastrointestinal: Negative for heartburn, nausea, vomiting, abdominal pain, diarrhea and constipation.   Genitourinary: Negative for dysuria, urgency, frequency and hematuria.   Musculoskeletal: Negative for myalgias, back pain, joint pain, falls and neck pain.   Skin: Negative for itching and rash.   Neurological: Negative for dizziness, tingling, tremors, sensory change, speech change, focal weakness and headaches.   Endo/Heme/Allergies: Negative for environmental allergies. Does not bruise/bleed easily.   Psychiatric/Behavioral: Negative for depression, suicidal ideas, memory loss and substance abuse. The patient is not nervous/anxious and does not have insomnia.      Assessment and Plan:      Left lower lobe community-acquired pneumonia- Clinically improving.  Continue IV Rocephin and Zithromax. Urine Legionella and strep antigen negative     Exertional dyspnea with Hypoxia secondary to acute bronchospasm-  Improving.  Continue IV Solu-Medrol 40 mg every 12 hours, scheduled DuoNeb every 6 hours, Singulair and Breo Ellipta. We will change to by mouth steroids in a.m.     Bilateral lower extremity edema-  improving with IV diuresis.  lower extremity Dopplers negative for deep vein thrombosis. Continue IV Lasix 40 mg daily.  Monitor I's and O's. Counseled low sodium diet.                                                                                                                    History of atrial fibrillation, status post permanent pacemaker placement-continue xarelto for anticoagulation.                                                        History of cardiomyopathy, moderate MR and severe TR     Anemia- likely chronic. Will monitor     Thrombocytopenia-will monitor     Hyperlipidemia-continue statin    Discussed the plan with patient's daughter at bedside    DVT Prohylaxis: Xarelto  Central Line/Foley Catheter/PICC line status: None  Code Status: Full Code  Disposition:Home when stable  Type of Admission:Inpatient  Anticipated Length of Stay: >2 MN  Medical Necessity for stay: Pneumonia  Hospital Problems:   Principal Problem:    Left lower lobe pneumonia  Active Problems:    HTN (hypertension)    Cardiomyopathy    Atrial fibrillation    HLD (hyperlipidemia)    Dyspnea on exertion    Hypoxia    Bilateral edema of lower extremity    Objective:     Filed Vitals:    03/08/16 0548 03/08/16 0753 03/08/16 0936 03/08/16 1320   BP: 141/69  173/96 156/63   Pulse: 80 74 96 76   Temp: 97.8 F (36.6 C)  97.9 F (36.6 C) 97.6 F (36.4 C)   TempSrc: Temporal Artery  Temporal Artery Temporal Artery   Resp: 20 18 16 20    Height:       Weight:       SpO2: 94%  93% 95%     Physical Exam:   Physical Exam   Constitutional: She is oriented to person, place, and time and well-developed, well-nourished, and in no distress. No distress.   HENT:   Head: Normocephalic and atraumatic.   Right Ear: External ear normal.   Left Ear: External ear normal.   Eyes: Conjunctivae and EOM are normal. Pupils are equal, round, and reactive to light. Right eye exhibits no discharge. Left eye exhibits no discharge. No scleral icterus.    Neck: No JVD present. No tracheal deviation present. No thyromegaly present.   Cardiovascular: Normal rate and regular rhythm.  Exam reveals no gallop and no friction rub.    No murmur heard.  Pulmonary/Chest: Effort normal. No stridor. No respiratory distress. She has no rales. She exhibits no tenderness.   Wheezing improved.   Abdominal: Soft. Bowel sounds are normal. She exhibits no distension. There is no tenderness. There is no rebound.   Musculoskeletal: She exhibits edema. She exhibits no tenderness.   Lymphadenopathy:     She has no cervical adenopathy.   Neurological: She is alert and oriented to person, place, and time. No cranial nerve deficit.   Skin: Skin is dry. No rash noted. She is not diaphoretic. No erythema. No pallor.   Psychiatric: Memory, affect and judgment normal.     Results of Labs/imaging   Labs and radiology reports have been reviewed.    Hospitalist   Signed by:   Julien Girt  03/08/2016 3:37 PM    *This note was generated by the Epic EMR system/ Dragon speech recognition and may contain inherent errors or omissions not intended by the user. Grammatical errors, random word insertions, deletions, pronoun errors and incomplete sentences are occasional consequences of this technology due to software limitations. Not all errors are caught or corrected. If there are questions or concerns about the content of this note or information contained within the body of this dictation they should be addressed directly with the author for clarification

## 2016-03-08 NOTE — Plan of Care (Signed)
Problem: Safety  Goal: Patient will be free from injury during hospitalization  Outcome: Progressing  Hourly rounding in place. Call bell, belongings and table at bedside. Daughter at bedside. Patientand daughter requested to  use call bell before OOB or for any needs. Both verbalized understanding and currently compliant.         Problem: Pain  Goal: Patient's pain/discomfort is manageable  Outcome: Progressing  Patient denies pain at this time.    Problem: Psychosocial and Spiritual Needs  Goal: Demonstrates ability to cope with hospitalization/illness  Outcome: Progressing  POC addressed, questions asked and answered. Cpap ordered, Iv antibiotics given, admission and assessment completed.

## 2016-03-08 NOTE — Plan of Care (Signed)
Admitted a 80 year old female patient from ED via stretcher to room, alert and oriented, daughter at bedside, not in distress, oriented to room set up, made aware of plan of care, call light placed within reach.

## 2016-03-08 NOTE — Plan of Care (Signed)
Ask3Teach3 Program    Education about New Medications and their Side effects    Dear Bethany Howell,    Its been a pleasure taking care of you during your hospitalization here at [facility]. We have initiated a new program to educate our patients and/or their family members or designated personnel about the new medications started by your physicians and their indications along with the possible side effects. Multiple studies have shown that patients started on new medications are often unaware of the names of the medication along with the indications and their side effects which leads to decreased compliance with the medications.    During our conversation today on 03/08/2016  5:08 PM I have explained to you the name of the new medication and the indication along with some possible common side effects. Listed below are some of the new medications started during this hospitalization.     Please call the Nurse if you have any side effects while in hospital.     Please call 911 if you have any life threatening symptoms after you are discharged from the hospital.    Please inform your Primary care physician for common side effects which are not life threatening after discharge.      Medication: fluticasone/vilanterol (Breo)   This Medication is used for:   COPD or Asthma   Decreases airway constriction and inflammation    Common Side Effects are:   Headache   Sore throat    A note from your nurse:  Rinse your mouth with water and spit out after using.  Call your nurse immediately if you notice itching, hives, swelling or trouble breathing       Thank you for your time.    Crystalee Ventress, RT  03/08/2016  5:08 PM  [facility]  [address]  [place], [place]  [address]

## 2016-03-08 NOTE — Plan of Care (Signed)
Problem: Inadequate Gas Exchange  Goal: Adequately oxygenating and ventilation is improved  Intervention: Position patient for maximum ventilatory efficiency  Pt sats 96% on RA at rest after walking to bathroom and back in bed sats 94%, sob with exertion, will continue to monitor

## 2016-03-08 NOTE — Plan of Care (Signed)
Problem: Safety  Goal: Patient will be free from injury during hospitalization  Outcome: Progressing  Pt resting in bed, call bell within reach, hourly rounding, non skid socks applied, will continue to monitor    Problem: Pain  Goal: Patient's pain/discomfort is manageable  Outcome: Progressing  Pt denies any pain at this time, will continue to monitor

## 2016-03-08 NOTE — Plan of Care (Signed)
Problem: Safety  Goal: Patient will be free from injury during hospitalization  Outcome: Progressing  Hourly rounding in place. Call bell, belongings and table at bedside.  Patient requested to use call bell before OOB. Patient verbalized understanding and currently compliant. Daughter to spend night at bedside.        Problem: Pain  Goal: Patient's pain/discomfort is manageable  Outcome: Progressing  Patient denies pain at this time.    Problem: Psychosocial and Spiritual Needs  Goal: Demonstrates ability to cope with hospitalization/illness  Outcome: Progressing  POC addressed, questions asked and answered. Patient still has DOE but feeling better. BLE edema decreasing. Still voiding frequently due to lasix. Patient bringing home CPAP, did not like hospital one provided. Patient hoping to sleep tonight.

## 2016-03-08 NOTE — Consults (Signed)
Nutritional Support Services  Nutrition Education    Bethany Howell 80 y.o. female  MRN [id number]    Referral Source: MD    Consult received to educate pt on:  Low sodium diet    Diet history and/or patient's prior knowledge of diet principles: Daughter also present in room - do not add salt to foods in general but occasionally may. Does not check sodium content of foods.     Handouts provided to patient: INC 2 gram sodium diet    Diet guidelines reviewed verbally with patient:  Discussed not adding salt to foods and may use salt free seasoning such as Mrs. Dash for flavoring; Reviewed foods in sodium content to avoid; discussed checking food labels for sodium content.    Level of understanding shown: Good    Expected compliance: Good    Business card left for patient to call with any questions or concerns.    Mardee Postin MS, RD

## 2016-03-08 NOTE — Plan of Care (Signed)
Pain Management Plan    Education about your Pain Management.    Dear Bethany Howell,    It is my pleasure to care for you during your hospitalization here at [facility]. You have reported that you are not experiencing pain at this time. If at any point you experience pain, please notify a member of the healthcare team. We are committed to meeting your unique needs during your hospitalization and can adjust your plan of care accordingly.      Thank you for your time.    Faylene Kurtz, RN  03/08/2016  3:05 AM  [facility]  [address]  [place], [place]  [address]

## 2016-03-09 DIAGNOSIS — J189 Pneumonia, unspecified organism: Secondary | ICD-10-CM | POA: Diagnosis not present

## 2016-03-09 DIAGNOSIS — R600 Localized edema: Secondary | ICD-10-CM | POA: Diagnosis not present

## 2016-03-09 DIAGNOSIS — I482 Chronic atrial fibrillation: Secondary | ICD-10-CM | POA: Diagnosis not present

## 2016-03-09 DIAGNOSIS — I429 Cardiomyopathy, unspecified: Secondary | ICD-10-CM | POA: Diagnosis not present

## 2016-03-09 LAB — CBC AND DIFFERENTIAL
Basophils Absolute Automated: 0.01 10*3/uL (ref 0.00–0.20)
Basophils Automated: 0 %
Eosinophils Absolute Automated: 0.01 10*3/uL (ref 0.00–0.70)
Eosinophils Automated: 0 %
Hematocrit: 37.4 % (ref 37.0–47.0)
Hgb: 11.7 g/dL — ABNORMAL LOW (ref 12.0–16.0)
Immature Granulocytes Absolute: 0.01 10*3/uL
Immature Granulocytes: 0 %
Lymphocytes Absolute Automated: 0.88 10*3/uL (ref 0.50–4.40)
Lymphocytes Automated: 12 %
MCH: 29.3 pg (ref 28.0–32.0)
MCHC: 31.3 g/dL — ABNORMAL LOW (ref 32.0–36.0)
MCV: 93.7 fL (ref 80.0–100.0)
MPV: 10.2 fL (ref 9.4–12.3)
Monocytes Absolute Automated: 0.59 10*3/uL (ref 0.00–1.20)
Monocytes: 8 %
Neutrophils Absolute: 5.75 10*3/uL (ref 1.80–8.10)
Neutrophils: 80 %
Platelets: 172 10*3/uL (ref 140–400)
RBC: 3.99 10*6/uL — ABNORMAL LOW (ref 4.20–5.40)
RDW: 17 % — ABNORMAL HIGH (ref 12–15)
WBC: 7.24 10*3/uL (ref 3.50–10.80)

## 2016-03-09 LAB — BASIC METABOLIC PANEL
Anion Gap: 10 (ref 5.0–15.0)
BUN: 24 mg/dL — ABNORMAL HIGH (ref 7.0–19.0)
CO2: 21 mEq/L — ABNORMAL LOW (ref 22–29)
Calcium: 9 mg/dL (ref 7.9–10.2)
Chloride: 110 mEq/L (ref 100–111)
Creatinine: 0.9 mg/dL (ref 0.6–1.0)
Glucose: 120 mg/dL — ABNORMAL HIGH (ref 70–100)
Potassium: 4.2 mEq/L (ref 3.5–5.1)
Sodium: 141 mEq/L (ref 136–145)

## 2016-03-09 LAB — GFR: EGFR: >60

## 2016-03-09 MED ORDER — GUAIFENESIN-DM 100-10 MG/5ML PO SYRP
5.0000 mL | ORAL_SOLUTION | ORAL | Status: DC | PRN
Start: 2016-03-09 — End: 2016-03-13
  Administered 2016-03-09 – 2016-03-10 (×5): 5 mL via ORAL
  Filled 2016-03-09 (×8): qty 5

## 2016-03-09 MED ORDER — PREDNISONE 20 MG PO TABS
20.0000 mg | ORAL_TABLET | Freq: Two times a day (BID) | ORAL | Status: DC
Start: 2016-03-09 — End: 2016-03-13
  Administered 2016-03-09 – 2016-03-13 (×8): 20 mg via ORAL
  Filled 2016-03-09 (×8): qty 1

## 2016-03-09 MED ORDER — ALBUTEROL-IPRATROPIUM 2.5-0.5 (3) MG/3ML IN SOLN
3.0000 mL | Freq: Four times a day (QID) | RESPIRATORY_TRACT | Status: DC | PRN
Start: 2016-03-09 — End: 2016-03-13
  Administered 2016-03-11 – 2016-03-13 (×2): 3 mL via RESPIRATORY_TRACT
  Filled 2016-03-09 (×2): qty 3

## 2016-03-09 NOTE — Plan of Care (Signed)
Problem: Safety  Goal: Patient will be free from injury during hospitalization  Outcome: Progressing  Pt resting in bed, call bell within reach, family at bedside, non skid socks applied, will continue to monitor    Problem: Pain  Goal: Patient's pain/discomfort is manageable  Outcome: Progressing  Pt denies any pain at this time, will continue to monitor    Problem: Inadequate breathing pattern (Asthma)  Goal: Effective ventilation maintained  Outcome: Progressing  Pt sats are wnl, sob on exertion, no oxygen at this time, productive cough, sputum specimen sent, will continue to monitor

## 2016-03-09 NOTE — Progress Notes (Signed)
[facility] HOSPITALIST  Progress Note  Patient Info:   Date/Time: 03/09/2016 / 3:23 PM   Admit Date:03/07/2016  Patient Name:Bethany Howell   ZOX:[id number]   PCP: Christa See, MD  Attending Physician:Adi Viona Gilmore, MD     Subjective:   03/09/2016   Feels better  +cough  SOB significantly improved    Chief Complaint:  Shortness of Breath  Update of Review of Systems:  Review of Systems   Constitutional: Negative for fever, chills, weight loss and malaise/fatigue.   HENT: Negative for hearing loss.    Eyes: Negative for blurred vision, double vision, photophobia and pain.   Respiratory: Negative for cough, hemoptysis and sputum production.         Improving shortness of breath   Cardiovascular: Negative for chest pain, palpitations and orthopnea.        Leg swelling, improving   Gastrointestinal: Negative for heartburn, nausea, vomiting, abdominal pain, diarrhea and constipation.   Genitourinary: Negative for dysuria, urgency, frequency and hematuria.   Musculoskeletal: Negative for myalgias, back pain, joint pain, falls and neck pain.   Skin: Negative for itching and rash.   Neurological: Negative for dizziness, tingling, tremors, sensory change, speech change, focal weakness and headaches.   Endo/Heme/Allergies: Negative for environmental allergies. Does not bruise/bleed easily.   Psychiatric/Behavioral: Negative for depression, suicidal ideas, memory loss and substance abuse. The patient is not nervous/anxious and does not have insomnia.      Assessment and Plan:      Left lower lobe community-acquired pneumonia- Clinically improving.  Continue IV Rocephin and Zithromax.Obtain sputum cx.  Prn Robitussin DM for cough.         Exertional dyspnea with Hypoxia secondary to acute bronchospasm-  Improving.  [place] IV steroids and start PO      Prednisone.  Change nebs to prn.  Continue Singulair and Breo Ellipta.      Bilateral lower extremity edema- improving with IV diuresis.  lower extremity Dopplers negative  for deep vein thrombosis. Continue IV Lasix 40 mg daily.  Monitor I's and O's. Counseled low sodium diet.                                                                                                                    History of atrial fibrillation, status post permanent pacemaker placement-continue xarelto for anticoagulation.                                                        History of cardiomyopathy, moderate MR and severe TR     Anemia- H/H stable     Thrombocytopenia-Resolved     Hyperlipidemia-continue statin        DVT Prohylaxis: Xarelto  Central Line/Foley Catheter/PICC line status: None  Code Status: Full Code  Disposition:Home when stable  Type of Admission:Inpatient  Anticipated Length of  Stay: >2 MN  Medical Necessity for stay: Pneumonia  Hospital Problems:   Principal Problem:    Left lower lobe pneumonia  Active Problems:    HTN (hypertension)    Cardiomyopathy    Atrial fibrillation    HLD (hyperlipidemia)    Dyspnea on exertion    Hypoxia    Bilateral edema of lower extremity    Objective:     Filed Vitals:    03/09/16 0617 03/09/16 0747 03/09/16 0935 03/09/16 1417   BP: 152/88  169/80 136/77   Pulse: 72 72 82 82   Temp: 97.6 F (36.4 C)  97.4 F (36.3 C) 96.9 F (36.1 C)   TempSrc: Temporal Artery  Temporal Artery Temporal Artery   Resp: 22 20 18 24    Height:       Weight:       SpO2: 94% 94% 95% 97%     Physical Exam:   Physical Exam   Constitutional: She is oriented to person, place, and time and well-developed, well-nourished, and in no distress. No distress.   HENT:   Head: Normocephalic and atraumatic.   Eyes: Conjunctivae and EOM are normal. Right eye exhibits no discharge. Left eye exhibits no discharge. No scleral icterus.   Neck: No JVD present.   Cardiovascular: Normal rate and regular rhythm.  Exam reveals no gallop and no friction rub.    No murmur heard.  Pulmonary/Chest: Effort normal. No stridor. No respiratory distress. She has no rales. She exhibits no  tenderness.   Wheezing improved.   Abdominal: Soft. Bowel sounds are normal. She exhibits no distension. There is no tenderness. There is no rebound.   Musculoskeletal: She exhibits edema. She exhibits no tenderness.   Neurological: She is alert and oriented to person, place, and time. No cranial nerve deficit.   Skin: Skin is dry. No rash noted. She is not diaphoretic. No erythema. No pallor.   Psychiatric: Memory, affect and judgment normal.     Results of Labs/imaging   Labs and radiology reports have been reviewed.    Hospitalist   Signed by:   Julien Girt  03/09/2016 3:23 PM    *This note was generated by the Epic EMR system/ Dragon speech recognition and may contain inherent errors or omissions not intended by the user. Grammatical errors, random word insertions, deletions, pronoun errors and incomplete sentences are occasional consequences of this technology due to software limitations. Not all errors are caught or corrected. If there are questions or concerns about the content of this note or information contained within the body of this dictation they should be addressed directly with the author for clarification

## 2016-03-09 NOTE — Clinical Note (Incomplete)
.  sin

## 2016-03-10 ENCOUNTER — Encounter (INDEPENDENT_AMBULATORY_CARE_PROVIDER_SITE_OTHER): Payer: Self-pay

## 2016-03-10 DIAGNOSIS — R0602 Shortness of breath: Secondary | ICD-10-CM | POA: Diagnosis not present

## 2016-03-10 DIAGNOSIS — I429 Cardiomyopathy, unspecified: Secondary | ICD-10-CM | POA: Diagnosis not present

## 2016-03-10 DIAGNOSIS — R600 Localized edema: Secondary | ICD-10-CM | POA: Diagnosis not present

## 2016-03-10 DIAGNOSIS — I482 Chronic atrial fibrillation: Secondary | ICD-10-CM | POA: Diagnosis not present

## 2016-03-10 DIAGNOSIS — J189 Pneumonia, unspecified organism: Secondary | ICD-10-CM | POA: Diagnosis not present

## 2016-03-10 LAB — GFR: EGFR: >60

## 2016-03-10 LAB — CBC AND DIFFERENTIAL
Basophils Absolute Automated: 0.01 10*3/uL (ref 0.00–0.20)
Basophils Automated: 0 %
Eosinophils Absolute Automated: 0 10*3/uL (ref 0.00–0.70)
Eosinophils Automated: 0 %
Hematocrit: 34.5 % — ABNORMAL LOW (ref 37.0–47.0)
Hgb: 10.9 g/dL — ABNORMAL LOW (ref 12.0–16.0)
Immature Granulocytes Absolute: 0.02 10*3/uL
Immature Granulocytes: 0 %
Lymphocytes Absolute Automated: 0.99 10*3/uL (ref 0.50–4.40)
Lymphocytes Automated: 12 %
MCH: 29.9 pg (ref 28.0–32.0)
MCHC: 31.6 g/dL — ABNORMAL LOW (ref 32.0–36.0)
MCV: 94.5 fL (ref 80.0–100.0)
MPV: 10.2 fL (ref 9.4–12.3)
Monocytes Absolute Automated: 0.76 10*3/uL (ref 0.00–1.20)
Monocytes: 10 %
Neutrophils Absolute: 6.13 10*3/uL (ref 1.80–8.10)
Neutrophils: 78 %
Platelets: 148 10*3/uL (ref 140–400)
RBC: 3.65 10*6/uL — ABNORMAL LOW (ref 4.20–5.40)
RDW: 17 % — ABNORMAL HIGH (ref 12–15)
WBC: 7.89 10*3/uL (ref 3.50–10.80)

## 2016-03-10 LAB — BASIC METABOLIC PANEL
Anion Gap: 9 (ref 5.0–15.0)
BUN: 29.4 mg/dL — ABNORMAL HIGH (ref 7.0–19.0)
CO2: 22 mEq/L (ref 22–29)
Calcium: 8.4 mg/dL (ref 7.9–10.2)
Chloride: 111 mEq/L (ref 100–111)
Creatinine: 0.8 mg/dL (ref 0.6–1.0)
Glucose: 104 mg/dL — ABNORMAL HIGH (ref 70–100)
Potassium: 4 mEq/L (ref 3.5–5.1)
Sodium: 142 mEq/L (ref 136–145)

## 2016-03-10 NOTE — Consults (Signed)
[place] [facility] CARDIOLOGY CONSULTATION REPORT  [facility]    Date Time: 03/10/2016 1:19 PM  Patient Name: Bethany, Howell  Requesting Physician: Julien Girt, MD       Reason for Consultation:   SOB      History:   Bethany Howell is a 80 y.o. female admitted on 03/07/2016.  We have been asked by Heide Spark, Aura Dials, MD,  to provide cardiac consultation, regarding SOB and lower extremity edema.  She is a lady with a history of known chronic AF, PPM in place, and known moderate mitral regurgitation, along with mod to severe tricuspid regurgitation.  She has been having progressive leg swelling and later SOB.  In the office she was seen and her Lasix was increased from 20 to 40 mg qd.  She was to get a series of OP studies but over the weekend her breathing worsened, and she came to the ED for evaluation.  IN the ED she was found to have a LLL PNA.  She was felt to be fluid overloaded as well - she has been urinating briskly with IV Lasix, and notes that her LEX edema and breathing is better.  She denies chest pain or pressure.  She tries to avoid excessive salt.  She is compliant with her medications by her report.    Past Medical History:     Past Medical History   Diagnosis Date   . Hypertension    . Cardiomyopathy    . Atrial fibrillation      with pacer    . Arthritis    . Hypercholesterolemia    . Basal cell carcinoma    . Congestive heart failure        Past Surgical History:     Past Surgical History   Procedure Laterality Date   . Hysterectomy     . Replacement total knee     . Colonoscopy N/A 04/06/2015     Procedure: COLONOSCOPY;  Surgeon: Nani Skillern, MD;  [facility];  Service: Gastroenterology;  Laterality: N/A;  w/ polypectomy   . Cardiac pacemaker placement         Family History:   History reviewed. No pertinent family history.    Social History:     Social History     Social History   . Marital Status: Widowed     Spouse Name: N/A   . Number of Children: N/A   . Years of  Education: N/A     Social History Main Topics   . Smoking status: Never Smoker    . Smokeless tobacco: Not on file   . Alcohol Use: No   . Drug Use: No   . Sexual Activity: Not on file     Other Topics Concern   . Not on file     Social History Narrative       Allergies:     Allergies   Allergen Reactions   . Codeine        Medications:     Facility-administered medications prior to admission   Medication   . [COMPLETED] albuterol-ipratropium (DUO-NEB) 2.5-0.5(3) mg/3 mL nebulizer 3 mL     Prescriptions prior to admission   Medication Sig   . albuterol (PROVENTIL HFA;VENTOLIN HFA) 108 (90 BASE) MCG/ACT inhaler Inhale 2 puffs into the lungs.   Marland Kitchen atorvastatin (LIPITOR) 10 MG tablet Take 10 mg by mouth daily.   . budesonide-formoterol (SYMBICORT) 160-4.5 MCG/ACT inhaler Inhale 2 puffs into the lungs 2 (two)  times daily.   . Cholecalciferol (VITAMIN D-3 PO) Take 1,000 mg by mouth daily.      . digoxin (LANOXIN) 0.125 MG tablet Take 1 tablet (125 mcg total) by mouth daily.   . furosemide (LASIX) 40 MG tablet Take 40 mg by mouth 2 (two) times daily.   Marland Kitchen gabapentin (NEURONTIN) 100 MG capsule Take 1 capsule (100 mg total) by mouth 3 (three) times daily.   Marland Kitchen losartan (COZAAR) 25 MG tablet Take 25 mg by mouth daily.      . metoprolol XL (TOPROL-XL) 50 MG 24 hr tablet Take 50 mg by mouth 2 (two) times daily.   . Potassium (POTASSIMIN PO) Take 10 mEq by mouth daily.      . rivaroxaban (XARELTO) 20 MG Tab Take 20 mg by mouth daily with dinner.   . diazePAM (VALIUM) 5 MG tablet Take 5 mg by mouth every 6 (six) hours as needed (vaginal suppository for spasms).       Current Facility-Administered Medications   Medication Dose Route Frequency Provider Last Rate Last Dose   . acetaminophen (TYLENOL) tablet 650 mg  650 mg Oral Q4H PRN Heide Spark, Swapna, MD       . albuterol-ipratropium (DUO-NEB) 2.5-0.5(3) mg/3 mL nebulizer 3 mL  3 mL Nebulization Q6H PRN Heide Spark, Aura Dials, MD       . alum & mag hydroxide-simethicone (MAALOX PLUS)  200-200-20 mg/5 mL suspension 15 mL  15 mL Oral Q4H PRN Heide Spark, Aura Dials, MD       . atorvastatin (LIPITOR) tablet 10 mg  10 mg Oral Daily Heide Spark, Aura Dials, MD   10 mg at 03/09/16 2305   . azithromycin (ZITHROMAX) 500 mg in sodium chloride 0.9 % 250 mL IVPB  500 mg Intravenous Q24H [facility] Heide Spark, Aura Dials, MD 250 mL/hr at 03/09/16 2113 500 mg at 03/09/16 2113   . cefTRIAXone (ROCEPHIN) 1 g in sodium chloride 0.9 % 100 mL IVPB mini-bag plus  1 g Intravenous Q24H [facility] Heide Spark, Aura Dials, MD 200 mL/hr at 03/09/16 2002 1 g at 03/09/16 2002   . digoxin (LANOXIN) tablet 125 mcg  125 mcg Oral Daily Heide Spark, Aura Dials, MD   125 mcg at 03/10/16 1029   . fluticasone furoate-vilanterol (BREO ELLIPTA) 100-25 MCG/INH 1 puff  1 puff Inhalation QAM Heide Spark, Aura Dials, MD   1 puff at 03/10/16 0749   . furosemide (LASIX) injection 40 mg  40 mg Intravenous Daily Heide Spark, Aura Dials, MD   40 mg at 03/10/16 1029   . gabapentin (NEURONTIN) capsule 100 mg  100 mg Oral TID Heide Spark, Aura Dials, MD   100 mg at 03/10/16 1029   . guaiFENesin-dextromethorphan (ROBITUSSIN DM) 100-10 MG/5ML syrup 5 mL  5 mL Oral Q4H PRN Heide Spark, Aura Dials, MD   5 mL at 03/10/16 1315   . lactobacillus/streptococcus (RISAQUAD) capsule 1 capsule  1 capsule Oral Daily Heide Spark, Aura Dials, MD   1 capsule at 03/10/16 1029   . losartan (COZAAR) tablet 25 mg  25 mg Oral Daily Heide Spark, Aura Dials, MD   25 mg at 03/10/16 1029   . metoprolol XL (TOPROL-XL) 24 hr tablet 50 mg  50 mg Oral BID Heide Spark, Aura Dials, MD   50 mg at 03/10/16 1029   . montelukast (SINGULAIR) tablet 10 mg  10 mg Oral QHS Heide Spark, Swapna, MD   10 mg at 03/09/16 2313   . naloxone [facility]) injection 0.2 mg  0.2 mg Intravenous PRN Julien Girt, MD       .  ondansetron (ZOFRAN) injection 4 mg  4 mg Intravenous Q6H PRN Heide Spark, Swapna, MD       . predniSONE (DELTASONE) tablet 20 mg  20 mg Oral BID Meals Heide Spark, Aura Dials, MD   20 mg at 03/10/16 0834   . rivaroxaban (XARELTO) tablet 20 mg  20 mg Oral Daily with  dinner Heide Spark, Aura Dials, MD   20 mg at 03/09/16 1658         Review of Systems:    Comprehensive review of systems including constitutional, eyes, ears, nose, mouth, throat, cardiovascular, GI, GU, musculoskeletal, integumentary, respiratory, neurologic, psychiatric, and endocrine is negative other than what is mentioned already in the history of present illness    Physical Exam:     Filed Vitals:    03/10/16 1315   BP: 152/73   Pulse: 79   Temp: 97.5 F (36.4 C)   Resp: 20   SpO2: 95%     Temp (24hrs), Avg:97.4 F (36.3 C), Min:96.9 F (36.1 C), Max:98 F (36.7 C)      Intake and Output Summary (Last 24 hours) at Date Time    Intake/Output Summary (Last 24 hours) at 03/10/16 1319  Last data filed at 03/10/16 1131   Gross per 24 hour   Intake   1620 ml   Output   2450 ml   Net   -830 ml       GENERAL: Patient is in no acute distress   HEENT: No scleral icterus or conjunctival pallor, moist mucous membranes   NECK: No jugular venous distention or thyromegaly, normal carotid upstrokes without bruits   CARDIAC: Normal apical impulse, irregular rate and rhythm, with normal S1 and S2, and 2/6 systolic murmur at the apex   CHEST: Clear to auscultation bilaterally,rhonchi bilaterally  ABDOMEN: No abdominal bruits, masses, or hepatosplenomegaly, nontender, non-distended, good bowel sounds   EXTREMITIES: venous stasis markings, no edema in an upright position  SKIN: No rash or jaundice   NEUROLOGIC: Alert and oriented to time, place and person, normal mood and affect    MUSCULOSKELETAL: Normal muscle strength and tone.      Labs Reviewed:       Recent Labs  Lab 03/07/16  1318   TROPONIN I <0.01       Recent Labs  Lab 03/07/16  1318   DIGOXIN LEVEL 0.37*           Recent Labs  Lab 03/07/16  1318   BILIRUBIN, TOTAL 1.6*   PROTEIN, TOTAL 7.4   ALBUMIN 3.9   ALT 17   AST (SGOT) 38*           Recent Labs  Lab 03/07/16  1318   PT 18.0*   PT INR 1.5   PTT 37       Recent Labs  Lab 03/10/16  0525 03/09/16  0704 03/08/16  0639    WBC 7.89 7.24 3.37*   HGB 10.9* 11.7* 10.9*   HEMATOCRIT 34.5* 37.4 34.3*   PLATELETS 148 172 133*       Recent Labs  Lab 03/10/16  0525 03/09/16  0704 03/08/16  0639   SODIUM 142 141 140   POTASSIUM 4.0 4.2 3.8   CHLORIDE 111 110 109   CO2 22 21* 22   BUN 29.4* 24.0* 13.2   CREATININE 0.8 0.9 0.7   EGFR >60.0 >60.0 >60.0   GLUCOSE 104* 120* 124*   CALCIUM 8.4 9.0 8.6     BNP - 334  Radiology   Radiological Procedure reviewed.      chest X-ray  Assessment:    SOB due to what appears to be a LLL as well as fluid overload/mild CHF.  BNP is mildly increased from baseline   Chronic AF   PPM in place   Preserved LVF with reported moderate MR and moderate-severe TR    Recommendations:    Continue diuresis   Treat respiratory infection with ABX   Echo when more euvolemic, likely tomorrow   Continue anticoagulation            Signed by: Coralyn Pear, MD      [facility]  NP Spectral[facility] [phone] (8am-5pm)  MD Spectralink [phone] (8am-5pm)  After hours, non urgent consult line [phone]  After Hours, urgent consults [phone]

## 2016-03-10 NOTE — UM Notes (Signed)
CSR 4/2:    03/09/2016   Feels better  +cough  SOB significantly improved  Leg swelling, improving   Sputum Cx sent    Assessment and Plan:      Left lower lobe community-acquired pneumonia- Clinically improving. Continue IV Rocephin and Zithromax.Obtain sputum cx. Prn Robitussin DM for cough.       Exertional dyspnea with Hypoxia secondary to acute bronchospasm- Improving. [place] IV steroids and start PO Prednisone. Change nebs to prn. Continue Singulair and Breo Ellipta.     Bilateral lower extremity edema- improving with IV diuresis. lower extremity Dopplers negative for deep vein thrombosis. Continue IV Lasix 40 mg daily. Monitor I's and O's. Counseled low sodium diet.       History of atrial fibrillation, status post permanent pacemaker placement-continue xarelto for anticoagulation.      History of cardiomyopathy, moderate MR and severe TR     Anemia- H/H stable     Thrombocytopenia-Resolved     Hyperlipidemia-continue statin            Vitals     Filed Vitals:    03/09/16 0617 03/09/16 0747 03/09/16 0935 03/09/16 1417   BP: 152/88  169/80 136/77   Pulse: 72 72 82 82   Temp: 97.6 F (36.4 C)  97.4 F (36.3 C) 96.9 F (36.1 C)   TempSrc: Temporal Artery  Temporal Artery Temporal Artery   Resp: 22 20 18 24    Height:       Weight:       SpO2: 94% 94% 95% 97%                 Abn labs;  hct 11.7, hct 3.99, Glucose 120, BUN 24, Co2 21

## 2016-03-10 NOTE — Progress Notes (Signed)
[facility] HOSPITALIST  Progress Note  Patient Info:   Date/Time: 03/10/2016 / 2:17 PM   Admit Date:03/07/2016  Patient Name:Bethany Howell   ZOX:[id number]   PCP: Christa See, MD  Attending Physician:Adi Viona Gilmore, MD     Subjective:   03/10/2016   Feels better  +cough  SOB significantly improved    Chief Complaint:  Shortness of Breath  Update of Review of Systems:  Review of Systems   Constitutional: Negative for fever, chills, weight loss and malaise/fatigue.   HENT: Negative for hearing loss.    Eyes: Negative for blurred vision, double vision, photophobia and pain.   Respiratory: Positive for wheezing. Negative for cough, hemoptysis and sputum production.         Improving shortness of breath   Cardiovascular: Negative for chest pain, palpitations and orthopnea.        Leg swelling, improving   Gastrointestinal: Negative for heartburn, nausea, vomiting, abdominal pain, diarrhea and constipation.   Genitourinary: Negative for dysuria, urgency, frequency and hematuria.   Musculoskeletal: Negative for myalgias, back pain, joint pain, falls and neck pain.   Skin: Negative for itching and rash.   Neurological: Negative for dizziness, tingling, tremors, sensory change, speech change, focal weakness and headaches.   Endo/Heme/Allergies: Negative for environmental allergies. Does not bruise/bleed easily.   Psychiatric/Behavioral: Negative for depression, suicidal ideas, memory loss and substance abuse. The patient is not nervous/anxious and does not have insomnia.      Assessment and Plan:      Left Lower Lobe community-acquired pneumonia- Clinically improving.  Continue IV Rocephin and Zithromax.Sputum cx pending.  Prn Robitussin DM for cough.         Dyspnea sec to Acute bronchospasm and mild CHF, likely diastolic,  Improving.  Steroids changed to PO.  Change nebs to prn.   Continue Singulair and Breo Ellipta.      Bilateral lower extremity edema - Sec to diastolic CHF- improving with IV diuresis.   Cardiology consultation obtained with [place] [facility].  Continue IV Lasix .  Echo when more euvolemic per cardiology lower extremity Dopplers negative for deep vein thrombosis. Monitor I's and O's. Counseled low sodium diet.                                                                                                                    History of atrial fibrillation, status post permanent pacemaker placement-continue xarelto for anticoagulation.                                                        History of cardiomyopathy, moderate MR and severe TR     Anemia- H/H stable     Thrombocytopenia-Resolved     Hyperlipidemia-continue statin    Anticipated discharge in 1-2 days    Discussed the plan with patient's daughter.  DVT Prohylaxis: Xarelto  Central Line/Foley Catheter/PICC line status: None  Code Status: Full Code  Disposition:Home when stable  Type of Admission:Inpatient  Anticipated Length of Stay: >2 MN  Medical Necessity for stay: Pneumonia  Hospital Problems:   Principal Problem:    Left lower lobe pneumonia  Active Problems:    HTN (hypertension)    Cardiomyopathy    Atrial fibrillation    HLD (hyperlipidemia)    Dyspnea on exertion    Hypoxia    Bilateral edema of lower extremity    Objective:     Filed Vitals:    03/10/16 0531 03/10/16 0749 03/10/16 0943 03/10/16 1315   BP: 158/79  179/86 152/73   Pulse: 75 72 94 79   Temp: 98 F (36.7 C)  97.3 F (36.3 C) 97.5 F (36.4 C)   TempSrc: Temporal Artery  Temporal Artery Temporal Artery   Resp: 20 18 18 20    Height:       Weight:       SpO2: 94%  97% 95%     Physical Exam:   Physical Exam   Constitutional: She is oriented to person, place, and time and well-developed, well-nourished, and in no distress. No distress.   HENT:   Head: Normocephalic and atraumatic.   Eyes: Conjunctivae and EOM are normal. Right eye exhibits no discharge. Left eye exhibits no discharge. No scleral icterus.   Neck: No JVD present.   Cardiovascular: Normal rate  and regular rhythm.  Exam reveals no gallop and no friction rub.    No murmur heard.  Pulmonary/Chest: Effort normal. No stridor. No respiratory distress. She has wheezes. She has no rales. She exhibits no tenderness.   Mild expiratory wheezing   Abdominal: Soft. Bowel sounds are normal. She exhibits no distension. There is no tenderness. There is no rebound.   Musculoskeletal: She exhibits edema. She exhibits no tenderness.   Lower extremity edema, improving   Neurological: She is alert and oriented to person, place, and time. No cranial nerve deficit.   Skin: Skin is dry. No rash noted. She is not diaphoretic. No erythema. No pallor.   Psychiatric: Memory, affect and judgment normal.     Results of Labs/imaging   Labs and radiology reports have been reviewed.    Hospitalist   Signed by:   Julien Girt  03/10/2016 2:17 PM    *This note was generated by the Epic EMR system/ Dragon speech recognition and may contain inherent errors or omissions not intended by the user. Grammatical errors, random word insertions, deletions, pronoun errors and incomplete sentences are occasional consequences of this technology due to software limitations. Not all errors are caught or corrected. If there are questions or concerns about the content of this note or information contained within the body of this dictation they should be addressed directly with the author for clarification

## 2016-03-10 NOTE — Progress Notes (Signed)
[facility] ([facility])    Received a referral to schedule a follow up appointment with the [facility].  Appointment scheduled for 4/6  at 3:00 pm with NP Bynum Bellows at [place] location.      Clinic address is as follows:    [address] [place], [address]  [address] [place], [address]  [address] [facility] [address] [facility] [place], [address]  [address] [place], [address] [address] [place], [address]    Please notify patient to arrive 15 minutes early to the appointment and bring the following materials with them:    Insurance card (if insured) and photo ID  Medications in their original bottles  Glucometer/blood sugar log (if diabetic)  Weight log (if heart failure)  Proof of income (to enroll in medication assistance programs-first two pages of signed 1040 tax forms or last 2 months of pay stubs)      Leandra Kern Reg Rep II  [employer]  T [phone] F [phone]

## 2016-03-10 NOTE — Plan of Care (Signed)
Problem: Safety  Goal: Patient will be free from injury during hospitalization  Intervention: Assess patient's risk for falls and implement fall prevention plan of care per policy  Hourly rounding  Call bell within reach  Provide and maintain safe environment  Involve  paient  and family in care  Bed alarm on  Assess fall risk and update on white board   Keep door open and stay near room

## 2016-03-10 NOTE — Plan of Care (Signed)
Problem: Pain  Goal: Patient's pain/discomfort is manageable  Outcome: Progressing  routin pain assessment done. Pt stated no pain at this time. Will continue to monitor.     Problem: Moderate/High Fall Risk Score >5  Goal: Patient will remain free of falls  Outcome: Progressing    Problem: Inadequate Gas Exchange  Goal: Adequately oxygenating and ventilation is improved  Outcome: Progressing  Pt has cough when she take a deep breath. No SOB noted

## 2016-03-11 ENCOUNTER — Inpatient Hospital Stay: Payer: Medicare (Managed Care)

## 2016-03-11 DIAGNOSIS — E7800 Pure hypercholesterolemia, unspecified: Secondary | ICD-10-CM

## 2016-03-11 DIAGNOSIS — I480 Paroxysmal atrial fibrillation: Secondary | ICD-10-CM

## 2016-03-11 DIAGNOSIS — R600 Localized edema: Secondary | ICD-10-CM | POA: Diagnosis not present

## 2016-03-11 DIAGNOSIS — R0602 Shortness of breath: Secondary | ICD-10-CM | POA: Diagnosis not present

## 2016-03-11 DIAGNOSIS — I429 Cardiomyopathy, unspecified: Secondary | ICD-10-CM | POA: Diagnosis not present

## 2016-03-11 DIAGNOSIS — J90 Pleural effusion, not elsewhere classified: Secondary | ICD-10-CM | POA: Diagnosis not present

## 2016-03-11 DIAGNOSIS — I482 Chronic atrial fibrillation: Secondary | ICD-10-CM | POA: Diagnosis not present

## 2016-03-11 DIAGNOSIS — I517 Cardiomegaly: Secondary | ICD-10-CM | POA: Diagnosis not present

## 2016-03-11 LAB — MAN DIFF ONLY
Band Neutrophils Absolute: 0 10*3/uL (ref 0.00–1.00)
Band Neutrophils: 0 %
Basophils Absolute Manual: 0 10*3/uL (ref 0.00–0.20)
Basophils Manual: 0 %
Eosinophils Absolute Manual: 0 10*3/uL (ref 0.00–0.70)
Eosinophils Manual: 0 %
Lymphocytes Absolute Manual: 1.37 10*3/uL (ref 0.50–4.40)
Lymphocytes Manual: 20 %
Monocytes Absolute: 0.68 10*3/uL (ref 0.00–1.20)
Monocytes Manual: 10 %
Neutrophils Absolute Manual: 4.78 10*3/uL (ref 1.80–8.10)
Nucleated RBC: 0 /100 WBC (ref 0–1)
Segmented Neutrophils: 70 %

## 2016-03-11 LAB — CELL MORPHOLOGY
Cell Morphology: ABNORMAL — AB
Platelet Estimate: DECREASED — AB

## 2016-03-11 LAB — CBC AND DIFFERENTIAL
Hematocrit: 36 % — ABNORMAL LOW (ref 37.0–47.0)
Hgb: 11.5 g/dL — ABNORMAL LOW (ref 12.0–16.0)
MCH: 29.8 pg (ref 28.0–32.0)
MCHC: 31.9 g/dL — ABNORMAL LOW (ref 32.0–36.0)
MCV: 93.3 fL (ref 80.0–100.0)
MPV: 9.6 fL (ref 9.4–12.3)
Platelets: 134 10*3/uL — ABNORMAL LOW (ref 140–400)
RBC: 3.86 10*6/uL — ABNORMAL LOW (ref 4.20–5.40)
RDW: 16 % — ABNORMAL HIGH (ref 12–15)
WBC: 6.83 10*3/uL (ref 3.50–10.80)

## 2016-03-11 LAB — BASIC METABOLIC PANEL
Anion Gap: 9 (ref 5.0–15.0)
BUN: 31.9 mg/dL — ABNORMAL HIGH (ref 7.0–19.0)
CO2: 23 mEq/L (ref 22–29)
Calcium: 8.8 mg/dL (ref 7.9–10.2)
Chloride: 110 mEq/L (ref 100–111)
Creatinine: 0.8 mg/dL (ref 0.6–1.0)
Glucose: 89 mg/dL (ref 70–100)
Potassium: 3.8 mEq/L (ref 3.5–5.1)
Sodium: 142 mEq/L (ref 136–145)

## 2016-03-11 LAB — GFR: EGFR: >60

## 2016-03-11 MED ORDER — HYDRALAZINE HCL 20 MG/ML IJ SOLN
10.0000 mg | Freq: Four times a day (QID) | INTRAMUSCULAR | Status: DC | PRN
Start: 2016-03-11 — End: 2016-03-13
  Administered 2016-03-11 – 2016-03-13 (×2): 10 mg via INTRAVENOUS
  Filled 2016-03-11 (×2): qty 1

## 2016-03-11 NOTE — Progress Notes (Signed)
[facility] HOSPITALIST  Progress Note  Patient Info:   Date/Time: 03/11/2016 / 2:38 PM   Admit Date:03/07/2016  Patient Name:Bethany Howell   UXL:[id number]   PCP: Christa See, MD  Attending Physician:Shamecca Whitebread, Donnita Falls, MD     Subjective:     Feels better, sitting up in a chair, eating her lunch today.  Does have +cough,mild sputum production which is yellowish in color,  SOB significantly improved, needing 2 L/m oxygen by nasal cannula  No fevers, chills  No overnight events    Chief Complaint:  Shortness of Breath  Update of Review of Systems:  Review of Systems   Constitutional: Negative for fever, chills, weight loss and malaise/fatigue.   HENT: Negative for hearing loss.    Eyes: Negative for blurred vision, double vision, photophobia and pain.   Respiratory: Positive for wheezing. Negative for cough, hemoptysis and sputum production.         Improving shortness of breath   Cardiovascular: Negative for chest pain, palpitations and orthopnea.        Leg swelling, improving   Gastrointestinal: Negative for heartburn, nausea, vomiting, abdominal pain, diarrhea and constipation.   Genitourinary: Negative for dysuria, urgency, frequency and hematuria.   Musculoskeletal: Negative for myalgias, back pain, joint pain, falls and neck pain.   Skin: Negative for itching and rash.   Neurological: Negative for dizziness, tingling, tremors, sensory change, speech change, focal weakness and headaches.   Endo/Heme/Allergies: Negative for environmental allergies. Does not bruise/bleed easily.   Psychiatric/Behavioral: Negative for depression, suicidal ideas, memory loss and substance abuse. The patient is not nervous/anxious and does not have insomnia.      Assessment and Plan:      Left Lower Lobe community-acquired pneumonia- Clinically improving.  Continue IV Rocephin and Zithromax.Sputum cx negative so far ,urine for strep and legionella negative . Prn Robitussin DM for cough.  Can taper abx in 24 -48 hrs        Dyspnea  sec to Acute bronchospasm and mild acute on chronic CHF, likely diastolic,  Improving.  Steroids changed to PO prednsione 20 mg bid ,  Cont duo nebs prn.   Continue Singulair and Breo Ellipta.      Bilateral lower extremity edema - Sec to diastolic CHF- improving with IV diuresis.  Cardiology consultation obtained with [place] [facility].  Continue IV Lasix 40 mg ,change to po lasix in am .  Echo to evaluate MR  in am once euvolemic  per cardiology, lower extremity Dopplers negative for deep vein thrombosis. Monitor I's and O's. Counseled low sodium diet.    Previous echo with preserved LVF with mod MR and mod-severe TR                                                                                                                    History of atrial fibrillation, status post permanent pacemaker placement-continue xarelto for anticoagulation.  History of cardiomyopathy, moderate MR and severe TR     Anemia- H/H stable     Thrombocytopenia-Resolved     Hyperlipidemia-continue statin    Anticipated discharge in 1-2 days    Discussed  plan of care with pt ,patient's daughter ,nursing staff         DVT Prohylaxis: Xarelto  Central Line/Foley Catheter/PICC line status: None  Code Status: Full Code  Disposition:Home when stable  Type of Admission:Inpatient  Anticipated Length of Stay: >2 MN  Medical Necessity for stay: Pneumonia  Hospital Problems:   Principal Problem:    Left lower lobe pneumonia  Active Problems:    HTN (hypertension)    Cardiomyopathy    Atrial fibrillation    HLD (hyperlipidemia)    Dyspnea on exertion    Hypoxia    Bilateral edema of lower extremity    Objective:     Filed Vitals:    03/11/16 0715 03/11/16 0839 03/11/16 0859 03/11/16 1341   BP:  161/90 153/83 152/81   Pulse: 69  79 77   Temp:   96.8 F (36 C) 97.2 F (36.2 C)   TempSrc:   Temporal Artery Temporal Artery   Resp: 20  20 20    Height:       Weight:       SpO2: 95%  97% 98%      Physical Exam:   Physical Exam   Constitutional: She is oriented to person, place, and time and well-developed, well-nourished, and in no distress. No distress.   HENT:   Head: Normocephalic and atraumatic.   Eyes: Conjunctivae and EOM are normal. Right eye exhibits no discharge. Left eye exhibits no discharge. No scleral icterus.   Neck: No JVD present.   Cardiovascular: Normal rate and regular rhythm.  Exam reveals no gallop and no friction rub.    No murmur heard.  Pulmonary/Chest: Effort normal. No stridor. No respiratory distress. She has wheezes. She has no rales. She exhibits no tenderness.   Mild expiratory wheezing   Abdominal: Soft. Bowel sounds are normal. She exhibits no distension. There is no tenderness. There is no rebound.   Musculoskeletal: She exhibits edema. She exhibits no tenderness.   Lower extremity edema, improving   Neurological: She is alert and oriented to person, place, and time. No cranial nerve deficit.   Skin: Skin is dry. No rash noted. She is not diaphoretic. No erythema. No pallor.   Psychiatric: Memory, affect and judgment normal.     Results of Labs/imaging   Labs and radiology reports have been reviewed.    Hospitalist   Signed by:   Marella Chimes  03/11/2016 2:38 PM    *This note was generated by the Epic EMR system/ Dragon speech recognition and may contain inherent errors or omissions not intended by the user. Grammatical errors, random word insertions, deletions, pronoun errors and incomplete sentences are occasional consequences of this technology due to software limitations. Not all errors are caught or corrected. If there are questions or concerns about the content of this note or information contained within the body of this dictation they should be addressed directly with the author for clarification

## 2016-03-11 NOTE — Plan of Care (Signed)
Problem: Health Promotion  Goal: Knowledge - disease process  Extent of understanding conveyed about a specific disease process.   Outcome: Progressing  Assessment completed. No changes noted/ pt denies pain or discomfort at this time. No s/s of distress noted. POC reviewed. Daughter at bedside. Call bell within reach. Will monitor    Problem: Safety  Goal: Patient will be free from injury during hospitalization  Outcome: Progressing  Hourly rounding  Call bell within reach  Provide and maintain safe environment  Involve  paient  and family in care  Bed alarm on  Assess fall risk and update on white board   Keep door open and stay near room     Problem: Pain  Goal: Patient's pain/discomfort is manageable  Outcome: Progressing  Educate about PRN pain medication  Provide medication in timely fashion   Involve pateint and family in decision related to pain management  Pt denies pain. Will monitor    Problem: Inadequate Gas Exchange  Goal: Adequately oxygenating and ventilation is improved  Outcome: Progressing

## 2016-03-11 NOTE — Progress Notes (Addendum)
[place] [facility]  PROGRESS NOTE  [facility]    Date Time: 03/11/2016 2:57 PM  Patient Name: Bethany Howell,Bethany Howell         Assessment:      SOB due to what appears to be a LLL as well as fluid overload/mild CHF. BNP is mildly increased from baseline   Chronic AF   PPM in place   Preserved LVF with reported moderate MR and moderate-severe TR    Recommendations:    Check Echo to further evaluate MR.    Likely change lasix to po in am .       Medications:      Scheduled Meds: PRN Meds:      atorvastatin 10 mg Oral Daily   azithromycin 500 mg Intravenous Q24H SCH   cefTRIAXone 1 g Intravenous Q24H SCH   digoxin 125 mcg Oral Daily   fluticasone furoate-vilanterol 1 puff Inhalation QAM   furosemide 40 mg Intravenous Daily   gabapentin 100 mg Oral TID   lactobacillus/streptococcus 1 capsule Oral Daily   losartan 25 mg Oral Daily   metoprolol XL 50 mg Oral BID   montelukast 10 mg Oral QHS   predniSONE 20 mg Oral BID Meals   rivaroxaban 20 mg Oral Daily with dinner       Continuous Infusions:      acetaminophen 650 mg Q4H PRN   albuterol-ipratropium 3 mL Q6H PRN   alum & mag hydroxide-simethicone 15 mL Q4H PRN   guaiFENesin-dextromethorphan 5 mL Q4H PRN   naloxone 0.2 mg PRN   ondansetron 4 mg Q6H PRN             Subjective:   Denies chest pain, SOB or palpitations.      Physical Exam:     Filed Vitals:    03/11/16 1341   BP: 152/81   Pulse: 77   Temp: 97.2 F (36.2 C)   Resp: 20   SpO2: 98%     Temp (24hrs), Avg:97.3 F (36.3 C), Min:96.8 F (36 C), Max:98 F (36.7 C)       Intake and Output Summary (Last 24 hours) at Date Time    Intake/Output Summary (Last 24 hours) at 03/11/16 1457  Last data filed at 03/11/16 1100   Gross per 24 hour   Intake    983 ml   Output   2950 ml   Net  -1967 ml       General Appearance:  Breathing comfortable, no acute distress  Head:  normocephalic  Neck:  No  jugular venous distension, brisk carotid upstroke  Lungs:  Clear to auscultation throughout with mild rhonchi, no wheezes.    Heart:  S1, S2 normal, no S3, no S4, SEM 2/6 PMI not displaced, no rub   Abdomen:  Soft, non-tender, positive bowel sounds, no hepatojugular reflux  Extremities:  No cyanosis, clubbing or edema  Pulses:  Equal radial pulses, 4/4 symmetric  Neurologic:  Alert and oriented x3, mood and affect normal  Musculoskeletal: normal strength and tone    Labs:     Recent Labs  Lab 03/07/16  1318   TROPONIN I <0.01       Recent Labs  Lab 03/07/16  1318   DIGOXIN LEVEL 0.37*           Recent Labs  Lab 03/07/16  1318   BILIRUBIN, TOTAL 1.6*   PROTEIN, TOTAL 7.4   ALBUMIN 3.9   ALT 17   AST (SGOT) 38*  Recent Labs  Lab 03/07/16  1318   PT 18.0*   PT INR 1.5   PTT 37       Recent Labs  Lab 03/11/16  0632 03/10/16  0525 03/09/16  0704   WBC 6.83 7.89 7.24   HGB 11.5* 10.9* 11.7*   HEMATOCRIT 36.0* 34.5* 37.4   PLATELETS 134* 148 172       Recent Labs  Lab 03/11/16  0632 03/10/16  0525 03/09/16  0704   SODIUM 142 142 141   POTASSIUM 3.8 4.0 4.2   CHLORIDE 110 111 110   CO2 23 22 21*   BUN 31.9* 29.4* 24.0*   CREATININE 0.8 0.8 0.9   EGFR >60.0 >60.0 >60.0   GLUCOSE 89 104* 120*   CALCIUM 8.8 8.4 9.0           Invalid input(s): FREET4    .  Lab Results   Component Value Date    BNP 334.8* 03/07/2016      Estimated Creatinine Clearance: 66.5 mL/min (based on Cr of 0.8).    Weight Monitoring 04/10/2015 04/10/2015 04/24/2015 11/26/2015 03/07/2016 03/07/2016 03/07/2016   Height - - - 165.1 cm 162.6 cm 162.6 cm 162.6 cm   Height Method - - - - - Stated -   Weight 93.849 kg 93.849 kg 95.255 kg 95.255 kg 102.059 kg 101.152 kg 96.752 kg   Weight Method - - - - - Stated Actual   BMI (calculated) - - - 35 kg/m2 38.7 kg/m2 38.4 kg/m2 36.7 kg/m2         Imaging:   Radiological Procedure reviewed.              Signed by: Percell Belt, NP    Patient seen and examined.  Agree with NP/PA note, exam, and plan as above with changes in italics.    Harvie Morua A. Marnette Burgess, MD, [facility]    [facility]  NP Spectralink [phone] (8am-5pm)  MD Spectralink [phone]  (8am-5pm)  After hours, non urgent consult line [phone]  After Hours, urgent consults [phone]

## 2016-03-11 NOTE — Plan of Care (Signed)
Problem: Health Promotion  Goal: Knowledge - disease process  Extent of understanding conveyed about a specific disease process.   Outcome: Progressing    Problem: Safety  Goal: Patient will be free from injury during hospitalization  Outcome: Progressing  Hourly rounding  Call bell within reach  Provide and maintain safe environment  Involve  paient  and family in care  Bed alarm on  Assess fall risk and update on white board   Keep door open and stay near room     Problem: Pain  Goal: Patient's pain/discomfort is manageable  Outcome: Progressing  Educate about PRN pain medication  Provide medication in timely fashion   Involve pateint and family in decision related to pain management  Pt denies pain. Will monitor    Problem: Inadequate breathing pattern (Asthma)  Goal: Effective ventilation maintained  Outcome: Progressing  O2L 1.5

## 2016-03-11 NOTE — Plan of Care (Signed)
Ask3Teach3 Program    Education about New Medications and their Side effects    Dear Bethany Howell,    Its been a pleasure taking care of you during your hospitalization here at [facility]. We have initiated a new program to educate our patients and/or their family members or designated personnel about the new medications started by your physicians and their indications along with the possible side effects. Multiple studies have shown that patients started on new medications are often unaware of the names of the medication along with the indications and their side effects which leads to decreased compliance with the medications.    During our conversation today on 03/11/2016  5:24 PM I have explained to you the name of the new medication and the indication along with some possible common side effects. Listed below are some of the new medications started during this hospitalization.     Please call the Nurse if you have any side effects while in hospital.     Please call [phone] if you have any life threatening symptoms after you are discharged from the hospital.    Please inform your Primary care physician for common side effects which are not life threatening after discharge.      Medication: fluticasone/vilanterol (Breo)   This Medication is used for:   COPD or Asthma   Decreases airway constriction and inflammation    Common Side Effects are:   Headache   Sore throat    A note from your nurse:  Rinse your mouth with water and spit out after using.  Call your nurse immediately if you notice itching, hives, swelling or trouble breathing         Thank you for your time.    Adalberto Cole, RT  03/11/2016  5:24 PM  [facility]  [address]  [place], [place]  [address]

## 2016-03-12 ENCOUNTER — Inpatient Hospital Stay: Payer: Medicare (Managed Care)

## 2016-03-12 ENCOUNTER — Ambulatory Visit: Payer: Medicare (Managed Care)

## 2016-03-12 DIAGNOSIS — E7800 Pure hypercholesterolemia, unspecified: Secondary | ICD-10-CM | POA: Insufficient documentation

## 2016-03-12 DIAGNOSIS — I10 Essential (primary) hypertension: Secondary | ICD-10-CM | POA: Insufficient documentation

## 2016-03-12 DIAGNOSIS — I480 Paroxysmal atrial fibrillation: Secondary | ICD-10-CM | POA: Insufficient documentation

## 2016-03-12 DIAGNOSIS — R0602 Shortness of breath: Secondary | ICD-10-CM | POA: Diagnosis not present

## 2016-03-12 DIAGNOSIS — I482 Chronic atrial fibrillation: Secondary | ICD-10-CM | POA: Diagnosis not present

## 2016-03-12 DIAGNOSIS — I429 Cardiomyopathy, unspecified: Secondary | ICD-10-CM | POA: Diagnosis not present

## 2016-03-12 DIAGNOSIS — I509 Heart failure, unspecified: Secondary | ICD-10-CM | POA: Diagnosis not present

## 2016-03-12 DIAGNOSIS — R600 Localized edema: Secondary | ICD-10-CM | POA: Diagnosis not present

## 2016-03-12 LAB — CBC
Hematocrit: 39.7 % (ref 37.0–47.0)
Hgb: 12.7 g/dL (ref 12.0–16.0)
MCH: 29.8 pg (ref 28.0–32.0)
MCHC: 32 g/dL (ref 32.0–36.0)
MCV: 93.2 fL (ref 80.0–100.0)
MPV: 10.2 fL (ref 9.4–12.3)
Platelets: 164 10*3/uL (ref 140–400)
RBC: 4.26 10*6/uL (ref 4.20–5.40)
RDW: 16 % — ABNORMAL HIGH (ref 12–15)
WBC: 8.61 10*3/uL (ref 3.50–10.80)

## 2016-03-12 LAB — BASIC METABOLIC PANEL
Anion Gap: 10 (ref 5.0–15.0)
BUN: 31.5 mg/dL — ABNORMAL HIGH (ref 7.0–19.0)
CO2: 23 mEq/L (ref 22–29)
Calcium: 9.1 mg/dL (ref 7.9–10.2)
Chloride: 108 mEq/L (ref 100–111)
Creatinine: 0.8 mg/dL (ref 0.6–1.0)
Glucose: 94 mg/dL (ref 70–100)
Potassium: 3.9 mEq/L (ref 3.5–5.1)
Sodium: 141 mEq/L (ref 136–145)

## 2016-03-12 LAB — GFR: EGFR: >60

## 2016-03-12 MED ORDER — LEVOFLOXACIN 500 MG PO TABS
500.0000 mg | ORAL_TABLET | Freq: Every day | ORAL | Status: DC
Start: 2016-03-12 — End: 2016-03-13
  Administered 2016-03-12 – 2016-03-13 (×2): 500 mg via ORAL
  Filled 2016-03-12 (×2): qty 1

## 2016-03-12 MED ORDER — LOSARTAN POTASSIUM 25 MG PO TABS
50.0000 mg | ORAL_TABLET | Freq: Every day | ORAL | Status: DC
Start: 2016-03-13 — End: 2016-03-13
  Administered 2016-03-13: 50 mg via ORAL
  Filled 2016-03-12: qty 2

## 2016-03-12 MED ORDER — LOSARTAN POTASSIUM 25 MG PO TABS
25.0000 mg | ORAL_TABLET | Freq: Every day | ORAL | Status: AC
Start: 2016-03-12 — End: 2016-03-12
  Administered 2016-03-12: 25 mg via ORAL
  Filled 2016-03-12: qty 1

## 2016-03-12 MED ORDER — FUROSEMIDE 40 MG PO TABS
40.0000 mg | ORAL_TABLET | Freq: Every day | ORAL | Status: DC
Start: 2016-03-13 — End: 2016-03-12

## 2016-03-12 MED ORDER — FUROSEMIDE 40 MG PO TABS
40.0000 mg | ORAL_TABLET | Freq: Every day | ORAL | Status: DC
Start: 2016-03-12 — End: 2016-03-13
  Administered 2016-03-12 – 2016-03-13 (×2): 40 mg via ORAL
  Filled 2016-03-12 (×2): qty 1

## 2016-03-12 NOTE — Progress Notes (Signed)
PULSE OXIMETRY TESTING:   Document patient's oxygen at rest on room air:   _____% on room air, at rest (No ranges please)   _____% on oxygen, at rest at_____LPM via NC   IF 88% OR BELOW on room air, STOP HERE,   IF NOT ambulate patient on room air with exertion and document below:   _____% on room air, with exertion (must be 88% or below)   _____% on oxygen, with exertion, at_____LPM via NC   All three tests must be during the same session.   Please document in a PROGRESS NOTE.   Testing to qualify for home oxygen must be no earlier than 48 hours prior to discharge, or it will need to be repeated.   Please call the home health care liaison at [phone] when completed.

## 2016-03-12 NOTE — Plan of Care (Signed)
Problem: Safety  Goal: Patient will be free from injury during hospitalization  Outcome: Progressing  Fall precautions in place. Non-skid socks on. Call bell within reach. 1 person assist to bathroom, pt has steady gait. Daughter @ bedside.   Intervention: Hourly rounding.  Hourly rounding.       Problem: Pain  Goal: Patient's pain/discomfort is manageable  Outcome: Progressing  No c/o pain.     Problem: Inadequate breathing pattern (Asthma)  Goal: Effective ventilation maintained  Outcome: Progressing  Pt using IS, encouraging use 10x/hr. OOB to sit in chair this morning. Prefers to sit upright in bed d/t hx of back pain.   Intervention: Plan activities to conserve energy: plan rest periods.  Clustering care to allow rest periods.       Comments:   IV abx as ordered. Echo tech to come this afternoon.

## 2016-03-12 NOTE — Progress Notes (Signed)
Discharge Planning    Discharge Plan: home  Expected Discharge Date: 1-2 days    Rounded w/Dr. Vladimir Faster.  Pt scheduled for ECHO today.  CM will continue to monitor.        [facility] Jarah Pember  Clinical Case Manager   [facility]

## 2016-03-12 NOTE — Plan of Care (Signed)
Problem: Safety  Goal: Patient will be free from injury during hospitalization  Outcome: Progressing  Intervention: Provide and maintain safe environment  Patient educated about safety on unit.    Call bell is within reach.  Non-skid socks on.    Patient is adhering to safety plan.        Intervention: Hourly rounding.  Ongoing.  Patient cautioned to use call bell before getting out of bed.      Problem: Pain  Goal: Patient's pain/discomfort is manageable  Outcome: Progressing  Patient denies pain at this time.    Problem: Moderate/High Fall Risk Score >5  Goal: Patient will remain free of falls  Outcome: Progressing    Problem: Inadequate breathing pattern (Asthma)  Goal: Effective ventilation maintained  Intervention: Provide mechanical and oxygen support to facilitate gas exchange.  CPAP in place.

## 2016-03-12 NOTE — Progress Notes (Addendum)
[place] [facility]  PROGRESS NOTE  [facility]    Date Time: 03/12/2016 12:58 PM  Patient Name: [facility]       Patient Active Problem List   Diagnosis   . Pelvic pain   . HTN (hypertension)   . Cardiomyopathy   . Atrial fibrillation   . HLD (hyperlipidemia)   . Bacteremia   . Bronchitis   . Neuropathy   . SOB (shortness of breath)   . Left lower lobe pneumonia   . Dyspnea on exertion   . Hypoxia   . Bilateral edema of lower extremity       Assessment:      SOB due to what appears to be a LLL pneumonia as well as fluid overload/mild CHF. BNP is mildly increased from baseline   Chronic AF   PPM in place   HTN- uncontrolled   Preserved LVEF with reported moderate MR and moderate-severe TR    Recommendations:    Check Echo   Increase cozaar to 50mg  daily.   Increase lasix to 40mg  BID, which is the dose she was on at home, starting tomorrow.    Patient is planning on going to [facility] after this hospitalization for several months. She will f/u with [facility] when she returns to the area.    Medications:      Scheduled Meds: PRN Meds:      atorvastatin 10 mg Oral Daily   digoxin 125 mcg Oral Daily   fluticasone furoate-vilanterol 1 puff Inhalation QAM   furosemide 40 mg Oral Daily   gabapentin 100 mg Oral TID   lactobacillus/streptococcus 1 capsule Oral Daily   levoFLOXacin 500 mg Oral Daily   losartan 25 mg Oral Daily   metoprolol XL 50 mg Oral BID   montelukast 10 mg Oral QHS   predniSONE 20 mg Oral BID Meals   rivaroxaban 20 mg Oral Daily with dinner       Continuous Infusions:      acetaminophen 650 mg Q4H PRN   albuterol-ipratropium 3 mL Q6H PRN   alum & mag hydroxide-simethicone 15 mL Q4H PRN   guaiFENesin-dextromethorphan 5 mL Q4H PRN   hydrALAZINE 10 mg Q6H PRN   naloxone 0.2 mg PRN   ondansetron 4 mg Q6H PRN             Subjective:   Denies chest pain, SOB or palpitations.      Physical Exam:     Filed Vitals:    03/12/16 1004   BP:    Pulse: 90   Temp:    Resp:    SpO2:      Temp (24hrs), Avg:97.4 F  (36.3 C), Min:97 F (36.1 C), Max:97.9 F (36.6 C)  Blood pressure 149/91, pulse 71, temperature 97.9 F (36.6 C), temperature source Temporal Artery, resp. rate 16, height 1.626 m (5' 4.02"), weight 96.752 kg (213 lb 4.8 oz), SpO2 97 %.    Telemetry reviewed no changes.     Intake and Output Summary (Last 24 hours) at Date Time    Intake/Output Summary (Last 24 hours) at 03/12/16 1258  Last data filed at 03/12/16 1007   Gross per 24 hour   Intake    973 ml   Output   2250 ml   Net  -1277 ml       General Appearance:  Breathing comfortable, no acute distress  Head:  normocephalic  Neck:  No  jugular venous distension, brisk carotid upstroke  Lungs:  Clear to auscultation throughout,  no wheezes, rhonchi or rales, good respiratory effort   Heart:  S1, S2 normal, no S3, no S4, 1/6 SEM  Abdomen:  Soft, non-tender, positive bowel sound.  Extremities:  No cyanosis, clubbing or edema  Pulses:  Equal radial pulses, 4/4 symmetric  Neurologic:  Alert and oriented x3, mood and affect normal  Musculoskeletal: normal strength and tone    Labs:     Recent Labs  Lab 03/07/16  1318   TROPONIN I <0.01       Recent Labs  Lab 03/07/16  1318   DIGOXIN LEVEL 0.37*           Recent Labs  Lab 03/07/16  1318   BILIRUBIN, TOTAL 1.6*   PROTEIN, TOTAL 7.4   ALBUMIN 3.9   ALT 17   AST (SGOT) 38*           Recent Labs  Lab 03/07/16  1318   PT 18.0*   PT INR 1.5   PTT 37       Recent Labs  Lab 03/12/16  1235 03/11/16  0632 03/10/16  0525   WBC 8.61 6.83 7.89   HGB 12.7 11.5* 10.9*   HEMATOCRIT 39.7 36.0* 34.5*   PLATELETS 164 134* 148       Recent Labs  Lab 03/11/16  0632 03/10/16  0525 03/09/16  0704   SODIUM 142 142 141   POTASSIUM 3.8 4.0 4.2   CHLORIDE 110 111 110   CO2 23 22 21*   BUN 31.9* 29.4* 24.0*   CREATININE 0.8 0.8 0.9   EGFR >60.0 >60.0 >60.0   GLUCOSE 89 104* 120*   CALCIUM 8.8 8.4 9.0         .  Lab Results   Component Value Date    BNP 334.8* 03/07/2016      Estimated Creatinine Clearance: 66.5 mL/min (based on Cr of  0.8).    Weight Monitoring 04/10/2015 04/10/2015 04/24/2015 11/26/2015 03/07/2016 03/07/2016 03/07/2016   Height - - - 165.1 cm 162.6 cm 162.6 cm 162.6 cm   Height Method - - - - - Stated -   Weight 93.849 kg 93.849 kg 95.255 kg 95.255 kg 102.059 kg 101.152 kg 96.752 kg   Weight Method - - - - - Stated Actual   BMI (calculated) - - - 35 kg/m2 38.7 kg/m2 38.4 kg/m2 36.7 kg/m2         Imaging:   Radiological Procedure reviewed.  03/11/16  Improvement compared with 03/07/2016. Current study  demonstrates moderate cardiomegaly with vascular congestion and small  right pleural effusion. No pneumonia or other acute process  demonstrated.        Signed by: Percell Belt, NP      Patient seen and examined.  My assessment and plan as above. Heart exam: RRR, Lungs rhonchi    Marian Sorrow, M.D., [facility]      [place] [facility]  NP Spectralink [phone] (8am-5pm)  MD Spectralink [phone] (8am-5pm)  After hours, non urgent consult line [phone]  After Hours, urgent consults [phone]

## 2016-03-12 NOTE — Progress Notes (Signed)
[facility] HOSPITAL[facility]  Progress Note  Patient Info:   Date/Time: 03/12/2016 / 2:31 PM   Admit Date:03/07/2016  Patient Name:Bethany Howell   JYN:[id number]   PCP: Christa See, MD  Attending Physician:Larose Batres, [place], MD     Subjective:     Feels better, needing 1 lt/min oxygen   Does have +cough,mild sputum production which is yellowish in color,  SOB significantly improved  No fevers, chills  No overnight events    Chief Complaint:  Shortness of Breath  Update of Review of Systems:  Review of Systems   Constitutional: Negative for fever, chills, weight loss and malaise/fatigue.   HENT: Negative for hearing loss.    Eyes: Negative for blurred vision, double vision, photophobia and pain.   Respiratory: Positive for wheezing. Negative for cough, hemoptysis and sputum production.         Improving shortness of breath   Cardiovascular: Negative for chest pain, palpitations and orthopnea.        Leg swelling, improving   Gastrointestinal: Negative for heartburn, nausea, vomiting, abdominal pain, diarrhea and constipation.   Genitourinary: Negative for dysuria, urgency, frequency and hematuria.   Musculoskeletal: Negative for myalgias, back pain, joint pain, falls and neck pain.   Skin: Negative for itching and rash.   Neurological: Negative for dizziness, tingling, tremors, sensory change, speech change, focal weakness and headaches.   Endo/Heme/Allergies: Negative for environmental allergies. Does not bruise/bleed easily.   Psychiatric/Behavioral: Negative for depression, suicidal ideas, memory loss and substance abuse. The patient is not nervous/anxious and does not have insomnia.      Assessment and Plan:      Left Lower Lobe community-acquired pneumonia- Clinically improved .  Follow-up chest x-ray showed moderate cardiomegaly with vascular congestion and small right-sided pleural effusion.  No pneumonia noticed.  Discontinued IV antibiotics, taper to by mouth levofloxacin po 500 mg q day for another 2 days  .Sputum cx negative so far ,urine for strep and legionella negative . Prn Robitussin DM for cough.         Dyspnea sec to Acute bronchospasm and mild acute on chronic CHF, likely diastolic,  Improving.  Steroids changed to PO prednsione 20 mg bid ,  Cont duo nebs prn.   Continue Singulair and Breo Ellipta. Increased cozaar to 50 mg daily      Bilateral lower extremity edema - Sec to diastolic CHF- improving with IV diuresis.  Cardiology consultation obtained with [place] [facility],tapered iv lasix to po lasix 40 mg bid 40 mg .  -----Pending   Echo to evaluate MR  , lower extremity Dopplers negative for deep vein thrombosis. Monitor I's and O's. Counseled low sodium diet.    Previous echo with preserved LVF with mod MR and mod-severe TR                                                                                                                    History of atrial fibrillation, status post permanent pacemaker placement-continue xarelto for anticoagulation.  History of cardiomyopathy, moderate MR and severe TR     Anemia- H/H stable     Thrombocytopenia-Resolved     Hyperlipidemia-continue statin    Anticipated discharge in am     Discussed  plan of care with pt ,patient's daughter ,nursing staff         DVT Prohylaxis: Xarelto  Central Line/Foley Catheter/PICC line status: None  Code Status: Full Code  Disposition:Home when stable  Type of Admission:Inpatient  Anticipated Length of Stay: >2 MN  Medical Necessity for stay: Pneumonia  Hospital Problems:   Principal Problem:    Left lower lobe pneumonia  Active Problems:    HTN (hypertension)    Cardiomyopathy    Atrial fibrillation    HLD (hyperlipidemia)    Dyspnea on exertion    Hypoxia    Bilateral edema of lower extremity    Objective:     Filed Vitals:    03/12/16 0816 03/12/16 0913 03/12/16 1004 03/12/16 1301   BP:  156/86  149/91   Pulse: 70 71 90 71   Temp:  97.3 F (36.3 C)  97.9 F (36.6 C)    TempSrc:  Temporal Artery  Temporal Artery   Resp: 20 16  16    Height:       Weight:       SpO2:  97%  97%     Physical Exam:   Physical Exam   Constitutional: She is oriented to person, place, and time and well-developed, well-nourished, and in no distress. No distress.   HENT:   Head: Normocephalic and atraumatic.   Eyes: Conjunctivae and EOM are normal. Right eye exhibits no discharge. Left eye exhibits no discharge. No scleral icterus.   Neck: No JVD present.   Cardiovascular: Normal rate and regular rhythm.  Exam reveals no gallop and no friction rub.    No murmur heard.  Pulmonary/Chest: Effort normal. No stridor. No respiratory distress. She has wheezes. She has no rales. She exhibits no tenderness.   Mild expiratory wheezing   Abdominal: Soft. Bowel sounds are normal. She exhibits no distension. There is no tenderness. There is no rebound.   Musculoskeletal: She exhibits edema. She exhibits no tenderness.   Lower extremity edema, improving   Neurological: She is alert and oriented to person, place, and time. No cranial nerve deficit.   Skin: Skin is dry. No rash noted. She is not diaphoretic. No erythema. No pallor.   Psychiatric: Memory, affect and judgment normal.     Results of Labs/imaging   Labs and radiology reports have been reviewed.    Hospitalist   Signed by:   Marella Chimes  03/12/2016 2:31 PM    *This note was generated by the Epic EMR system/ Dragon speech recognition and may contain inherent errors or omissions not intended by the user. Grammatical errors, random word insertions, deletions, pronoun errors and incomplete sentences are occasional consequences of this technology due to software limitations. Not all errors are caught or corrected. If there are questions or concerns about the content of this note or information contained within the body of this dictation they should be addressed directly with the author for clarification

## 2016-03-13 ENCOUNTER — Ambulatory Visit: Payer: Medicare (Managed Care)

## 2016-03-13 ENCOUNTER — Ambulatory Visit (INDEPENDENT_AMBULATORY_CARE_PROVIDER_SITE_OTHER): Payer: Medicare (Managed Care) | Admitting: "Endocrinology

## 2016-03-13 DIAGNOSIS — E785 Hyperlipidemia, unspecified: Secondary | ICD-10-CM | POA: Diagnosis not present

## 2016-03-13 DIAGNOSIS — I480 Paroxysmal atrial fibrillation: Secondary | ICD-10-CM | POA: Diagnosis not present

## 2016-03-13 DIAGNOSIS — I429 Cardiomyopathy, unspecified: Secondary | ICD-10-CM | POA: Diagnosis not present

## 2016-03-13 DIAGNOSIS — J189 Pneumonia, unspecified organism: Secondary | ICD-10-CM | POA: Diagnosis not present

## 2016-03-13 DIAGNOSIS — R600 Localized edema: Secondary | ICD-10-CM | POA: Diagnosis not present

## 2016-03-13 DIAGNOSIS — I482 Chronic atrial fibrillation: Secondary | ICD-10-CM | POA: Diagnosis not present

## 2016-03-13 MED ORDER — PREDNISONE 10 MG PO TABS
ORAL_TABLET | ORAL | Status: AC
Start: 2016-03-13 — End: ?

## 2016-03-13 MED ORDER — ALBUTEROL SULFATE (2.5 MG/3ML) 0.083% IN NEBU
2.5000 mg | INHALATION_SOLUTION | Freq: Four times a day (QID) | RESPIRATORY_TRACT | Status: AC | PRN
Start: 2016-03-13 — End: ?

## 2016-03-13 MED ORDER — GUAIFENESIN-DM 100-10 MG/5ML PO SYRP
5.0000 mL | ORAL_SOLUTION | ORAL | Status: AC | PRN
Start: 2016-03-13 — End: ?

## 2016-03-13 MED ORDER — RISAQUAD PO CAPS
1.0000 | ORAL_CAPSULE | Freq: Every day | ORAL | Status: DC
Start: 2016-03-13 — End: 2016-03-13

## 2016-03-13 MED ORDER — LOSARTAN POTASSIUM 50 MG PO TABS
50.0000 mg | ORAL_TABLET | Freq: Every day | ORAL | Status: DC
Start: 2016-03-13 — End: 2016-03-13

## 2016-03-13 MED ORDER — PREDNISONE 10 MG PO TABS
ORAL_TABLET | ORAL | Status: DC
Start: 2016-03-13 — End: 2016-03-13

## 2016-03-13 MED ORDER — LEVOFLOXACIN 500 MG PO TABS
500.0000 mg | ORAL_TABLET | Freq: Every day | ORAL | Status: DC
Start: 2016-03-13 — End: 2018-03-25

## 2016-03-13 MED ORDER — LEVOFLOXACIN 500 MG PO TABS
500.0000 mg | ORAL_TABLET | Freq: Every day | ORAL | Status: DC
Start: 2016-03-13 — End: 2016-03-13

## 2016-03-13 MED ORDER — MONTELUKAST SODIUM 10 MG PO TABS
10.0000 mg | ORAL_TABLET | Freq: Every evening | ORAL | Status: AC
Start: 2016-03-13 — End: ?

## 2016-03-13 MED ORDER — RISAQUAD PO CAPS
1.0000 | ORAL_CAPSULE | Freq: Every day | ORAL | Status: AC
Start: 2016-03-13 — End: ?

## 2016-03-13 MED ORDER — LOSARTAN POTASSIUM 50 MG PO TABS
50.0000 mg | ORAL_TABLET | Freq: Every day | ORAL | Status: AC
Start: 2016-03-13 — End: ?

## 2016-03-13 MED ORDER — FUROSEMIDE 20 MG PO TABS
40.0000 mg | ORAL_TABLET | Freq: Every day | ORAL | Status: AC
Start: 2016-03-13 — End: ?

## 2016-03-13 NOTE — Discharge Instructions (Signed)
Levofloxacin tablets  What is this medicine?  LEVOFLOXACIN (lee voe FLOX a sin) is a quinolone antibiotic. It is used to treat certain kinds of bacterial infections. It will not work for colds, flu, or other viral infections.  How should I use this medicine?  Take this medicine by mouth with a full glass of water. Follow the directions on the prescription label. This medicine can be taken with or without food. Take your medicine at regular intervals. Do not take your medicine more often than directed. Do not skip doses or stop your medicine early even if you feel better. Do not stop taking except on your doctor's advice.  A special MedGuide will be given to you by the pharmacist with each prescription and refill. Be sure to read this information carefully each time.  Talk to your pediatrician regarding the use of this medicine in children. While this drug may be prescribed for children as young as 6 months for selected conditions, precautions do apply.  What side effects may I notice from receiving this medicine?  Side effects that you should report to your doctor or health care professional as soon as possible:   allergic reactions like skin rash or hives, swelling of the face, lips, or tongue   anxious   confusion   depressed mood   diarrhea   fast, irregular heartbeat   hallucination, loss of contact with reality   joint, muscle, or tendon pain or swelling   pain, tingling, numbness in the hands or feet   suicidal thoughts or other mood changes   sunburn   unusually weak or tired  Side effects that usually do not require medical attention (report to your doctor or health care professional if they continue or are bothersome):   dry mouth   headache   nausea   trouble sleeping  What may interact with this medicine?  Do not take this medicine with any of the following medications:   arsenic trioxide   chloroquine   droperidol   medicines for irregular heart rhythm like amiodarone, disopyramide,  dofetilide, flecainide, quinidine, procainamide, sotalol   some medicines for depression or mental problems like phenothiazines, pimozide, and ziprasidone  This medicine may also interact with the following medications:   amoxapine   antacids   birth control pills   cisapride   dairy products   didanosine (ddI) buffered tablets or powder   haloperidol   multivitamins   NSAIDS, medicines for pain and inflammation, like ibuprofen or naproxen   retinoid products like tretinoin or isotretinoin   risperidone   some other antibiotics like clarithromycin or erythromycin   sucralfate   theophylline   warfarin  What if I miss a dose?  If you miss a dose, take it as soon as you remember. If it is almost time for your next dose, take only that dose. Do not take double or extra doses.  Where should I keep my medicine?  Keep out of the reach of children.  Store at room temperature between 15 and 30 degrees C (59 and 86 degrees F). Keep in a tightly closed container. Throw away any unused medicine after the expiration date.  What should I tell my health care provider before I take this medicine?  They need to know if you have any of these conditions:   bone problems   cerebral disease   history of low levels of potassium in the blood   irregular heartbeat   joint problems   kidney disease     myasthenia gravis   seizures   tendon problems   tingling of the fingers or toes, or other nerve disorder   an unusual or allergic reaction to levofloxacin, other quinolone antibiotics, foods, dyes, or preservatives   pregnant or trying to get pregnant   breast-feeding  What should I watch for while using this medicine?  Tell your doctor or health care professional if your symptoms do not improve or if they get worse. Drink several glasses of water a day and cut down on drinks that contain caffeine. You must not get dehydrated while taking this medicine.  You may get drowsy or dizzy. Do not drive, use machinery, or  do anything that needs mental alertness until you know how this medicine affects you. Do not sit or stand up quickly, especially if you are an older patient. This reduces the risk of dizzy or fainting spells.  This medicine can make you more sensitive to the sun. Keep out of the sun. If you cannot avoid being in the sun, wear protective clothing and use a sunscreen. Do not use sun lamps or tanning beds/booths. Contact your doctor if you get a sunburn.  If you are a diabetic monitor your blood glucose carefully. If you get an unusual reading stop taking this medicine and call your doctor right away.  Do not treat diarrhea with over-the-counter products. Contact your doctor if you have diarrhea that lasts more than 2 days or if the diarrhea is severe and watery.  Avoid antacids, calcium, iron, and zinc products for 2 hours before and 2 hours after taking a dose of this medicine.  Date Last Reviewed:   NOTE:This sheet is a summary. It may not cover all possible information. If you have questions about this medicine, talk to your doctor, pharmacist, or health care provider. Copyright 2016 [employer]      [facility]-Nebulizer delivered to patient from [facility] [phone]

## 2016-03-13 NOTE — Progress Notes (Signed)
D/c instructions given to pt and pt's daughter. Verbalized understanding. Given Nebulizer by home health. Rx sent to pharmacy and hard copies given as well. IV removed. D/c home to care of daughter.

## 2016-03-13 NOTE — Discharge Summary (Signed)
Reed Pandy HOSPITALIST   [place] Summary   Patient Info:   Date/Time: 03/24/2016 / 8:57 AM   Admit Date:03/07/2016  Patient Name:Bethany Howell   VHQ:[id number]   PCP: Patsy Lager, MD  Attending Physician:No att. providers found     Hospital Course:   Please see H&P for complete details of HPI and ROS. The patient was admitted to [facility] and has been diagnosed with Hospital Problems:  Principal Problem:    Left lower lobe pneumonia  Active Problems:    HTN (hypertension)    Cardiomyopathy    Atrial fibrillation    HLD (hyperlipidemia)    Dyspnea on exertion    Hypoxia    Bilateral edema of lower extremity    Paroxysmal atrial fibrillation    Pure hypercholesterolemia    Essential hypertension   and has been taken care as mentioned below.    80 y/o female presented with SOB sec to LLL pneumonia as well as fluid overload/CHF exacerbation      Left Lower Lobe community-acquired pneumonia- Clinically improved . Follow-up chest x-ray showed moderate cardiomegaly with vascular congestion and small right-sided pleural effusion.treated with  IV antibiotics, taper to by mouth levofloxacin po 500 mg q day for another 2 days .Sputum cx negative so far ,urine for strep and legionella negative . Prn Robitussin DM for cough.      Dyspnea sec to Acute bronchospasm and mild acute on chronic CHF, likely diastolic, Improving. Steroids changed to tapering  PO prednsione 20 mg bid , Cont duo nebs prn. Continue Singulair and Breo Ellipta. d/c on cozaar  50 mg daily     Bilateral lower extremity edema - Sec to diastolic CHF- improved  with IV diuresis. Cardiology consultation obtained with [place] [facility],tapered iv lasix to po lasix 40 mg q daily (pt takes only 40 mg po q day prior to admission ) Dr.Bazaz informed prior to d/c   -- Echo reviewed ,cardiology cleared for d/c  , lower extremity Dopplers negative for deep vein thrombosis. Counseled low sodium diet.     echo with preserved LVF with mod MR and  mod-severe TR       History of atrial fibrillation, status post permanent pacemaker placement-continue xarelto for anticoagulation.      History of cardiomyopathy, moderate MR and severe TR     Anemia- H/H stable     Thrombocytopenia-Resolved     Hyperlipidemia-continue statin    Stable to be d/c home     Disposition: home   Condition at Discharge and Prognosis: stable   Admission Date:03/07/2016  Discharge Date: 03/24/2016  Type of Admission:Inpatient  Medical Necessity for stay:pneumonia   Code Status: Prior  Subjective at the time of discharge:   AAO times 3   Chief Complaint:  Shortness of Breath    Objective:     Filed Vitals:    03/13/16 0612 03/13/16 0804 03/13/16 0916 03/13/16 0956   BP: 175/98  146/66    Pulse: 70 79 83 81   Temp: 97.3 F (36.3 C)  96.8 F (36 C)    TempSrc:   Temporal Artery    Resp:  20 20    Height:       Weight:       SpO2: 95%  98%      Physical Exam:   Physical Exam   She is oriented to person, place, and time and well-developed, well-nourished, and in no distress. No distress.   HENT:   Head: Normocephalic and atraumatic.  Eyes: Conjunctivae and EOM are normal. Right eye exhibits no discharge. Left eye exhibits no discharge. No scleral icterus.   Neck: No JVD present.   Cardiovascular: Normal rate and regular rhythm. Exam reveals no gallop and no friction rub.   No murmur heard.  Pulmonary/Chest: Effort normal. No stridor. No respiratory distress.  She has no rales or wheezing   Abdominal: Soft. Bowel sounds are normal. She exhibits no distension. There is no tenderness. There is no rebound.   Musculoskeletal: She exhibits no tenderness.   Lower extremity edema  resolved   Neurological: She is alert and oriented to person, place, and time. No cranial nerve deficit.   Skin: Skin is dry. No rash noted. She is  not diaphoretic. No erythema. No pallor.   Psychiatric: Memory, affect and judgment normal.   Clinical Presentation:   History of Presenting Illness: Please refer to HPI in the Detailed H&P  Discharge Diagnosis and Instructions:   Hospital Problems:Principal Problem:    Left lower lobe pneumonia  Active Problems:    HTN (hypertension)    Cardiomyopathy    Atrial fibrillation    HLD (hyperlipidemia)    Dyspnea on exertion    Hypoxia    Bilateral edema of lower extremity    Paroxysmal atrial fibrillation    Pure hypercholesterolemia    Essential hypertension    Lists the present on admission hospital problems:Present on Admission:   . Left lower lobe pneumonia  . Dyspnea on exertion  . Hypoxia  . Bilateral edema of lower extremity  . Atrial fibrillation  . HTN (hypertension)  . Cardiomyopathy  . HLD (hyperlipidemia)  Consultants:[facility] IP CONSULT TO NUTRITION SERVICES  IP CONSULT TO CARDIOLOGY  [facility] FACE-TO-FACE (FTF) ENCOUNTER  Discharge Medications:   Discharge Medications:      Discharge Medication List      Taking          * albuterol 108 (90 Base) MCG/ACT inhaler   Dose:  2 puff   What changed:  Another medication with the same name was added. Make sure you understand how and when to take each.   Commonly known as:  PROVENTIL HFA;VENTOLIN HFA   Inhale 2 puffs into the lungs.       * albuterol (2.5 MG/3ML) 0.083% nebulizer solution   Dose:  2.5 mg   What changed:  You were already taking a medication with the same name, and this prescription was added. Make sure you understand how and when to take each.   Commonly known as:  PROVENTIL   Take 3 mLs (2.5 mg total) by nebulization every 6 (six) hours as needed for Wheezing.       atorvastatin 10 MG tablet   Dose:  10 mg   Commonly known as:  LIPITOR   Take 10 mg by mouth daily.       budesonide-formoterol 160-4.5 MCG/ACT inhaler   Dose:  2 puff   Commonly known as:  SYMBICORT   Inhale 2 puffs into the lungs 2 (two) times daily.       diazePAM 5 MG tablet    Dose:  5 mg   Commonly known as:  VALIUM   Take 5 mg by mouth every 6 (six) hours as needed (vaginal suppository for spasms).       digoxin 0.125 MG tablet   Dose:  125 mcg   Commonly known as:  LANOXIN   Take 1 tablet (125 mcg total) by mouth daily.  furosemide 20 MG tablet   Dose:  40 mg   What changed:    - medication strength  - when to take this   Commonly known as:  LASIX   Take 2 tablets (40 mg total) by mouth daily.       gabapentin 100 MG capsule   Dose:  100 mg   Commonly known as:  NEURONTIN   Take 1 capsule (100 mg total) by mouth 3 (three) times daily.       guaiFENesin-dextromethorphan 100-10 MG/5ML syrup   Dose:  5 mL   Commonly known as:  ROBITUSSIN DM   Take 5 mLs by mouth every 4 (four) hours as needed for Cough.       lactobacillus/streptococcus Caps   Dose:  1 capsule   Take 1 capsule by mouth daily.       levoFLOXacin 500 MG tablet   Dose:  500 mg   Commonly known as:  LEVAQUIN   Take 1 tablet (500 mg total) by mouth daily.       losartan 50 MG tablet   Dose:  50 mg   What changed:    - medication strength  - how much to take   Commonly known as:  COZAAR   Take 1 tablet (50 mg total) by mouth daily.       metoprolol XL 50 MG 24 hr tablet   Dose:  50 mg   Commonly known as:  TOPROL-XL   Take 50 mg by mouth 2 (two) times daily.       montelukast 10 MG tablet   Dose:  10 mg   Commonly known as:  SINGULAIR   Take 1 tablet (10 mg total) by mouth nightly.       POTASSIMIN PO   Dose:  10 mEq   Take 10 mEq by mouth daily.       predniSONE 10 MG tablet   Commonly known as:  DELTASONE   Prednisone 30 mg po q day  Taper decreasing by 10 mg every 2 days until its off       rivaroxaban 20 MG Tabs   Dose:  20 mg   Commonly known as:  XARELTO   Take 20 mg by mouth daily with dinner.       VITAMIN D-3 PO   Dose:  1000 mg   Take 1,000 mg by mouth daily.       * Notice:  This list has 2 medication(s) that are the same as other medications prescribed for you. Read the directions carefully, and ask your  doctor or other care provider to review them with you.        Follow up recommendations:   Follow up:   Follow-up Information     Follow up with [facility] On 03/18/2016.    Why:  follow up appointment scheduled for 4/11 at 1:30pm in [facility] information:    [address] [place]  [address]  [place] [address]  [phone]        Follow up with Pcp, Noneorunknown, MD.        Follow up with Pcp, Not[place]file, MD .         Results of Labs/imaging:   Labs have been reviewed:   Coagulation Profile:       CBC review:       Invalid input(s):  NEUTRABS, LYMPHSABS, BANDSABD, BASOPHILSAUT  Chem Review:    Results     **  No results found for the last 24 hours. **        Radiology reports have been reviewed:  Radiology Results (24 Hour)     ** No results found for the last 24 hours. **        Echocardiogram Adult Complete W Clr/ Dopp Waveform    03/12/2016  ECHOCARDIOGRAM Sonographer:  Steele Berg Indications:  CHF Height (in):  64 Weight (lb):  213 Blood Pressure:  149/91  2-D Measurements Left Ventricle                                          Diastolic Dimension:  41  (40-56 mm) Systolic Dimension:  24  (25-40 mm)     Septal Diastolic Thickness:  12  (6-11 mm)    Posterior Wall Thickness:  10  (6-11 mm) Fractional Shortening Percentage:  41%  (24-46 %)                       Visually Estimated Ejection Fraction:  60%  (55-75 %)                        Right Ventricle Diastolic Dimension:  32  (7-26 mm)                           Left Atrium End Systolic Dimension:  53  (19-40 mm)                    Aortic Root:  35  (20-37 mm)                       Doppler Measurements and Color Flow Imaging Valves                                        Aortic Valve:  1.7  (0.9-1.8 m/s).         Regurgitation:     Pulmonic Valve:  1.2  (0.6-0.9 m/s).     Regurgitation:     Mitral Valve:  1.1  (0.6-1.4 m/s).          Regurgitation:  Mild to moderate Tricuspid Valve:  0.6  (0.4-0.8 m/s.      Regurgitation:  Severe Left Ventricular Outflow Tract Velocity:  0.9 m/s. E/A Ratio:     Est. PASP:  35 Est. RA Mean Pressure:  10 LA Volume 65 (18-58 ml) LA Volume Index 32 (16-28 ml/m2) TAPSE 18 (> or =16 mm) Echocardiogram M-mode, 2D, spectral Doppler and color flow imaging were performed and interpreted.     03/12/2016  1.  The quality of the study is technically adequate for interpretation. 2.  The left ventricle is normal in size. Left ventricular systolic function is normal. There are no regional wall motion abnormalities. Estimated EF is 60%. There is mild concentric left ventricular hypertrophy. 3.  The left atrium is enlarged in size. 4.  The aortic valve is trileaflet. Normal valve excursion is present. There is no significant aortic insufficiency. No aortic stenosis is present. The aortic root is normal in size. Mild aortic valve sclerosis is seen. 5.  The mitral valve is sclerotic. Mild-to-moderate mitral insufficiency is present. 6.  The  right ventricle is dilated. Right ventricular function is normal. 7.  The right atrium is enlarged in size. 8. The tricuspid valve is structurally normal. There is severe tricuspid insufficiency. There is incomplete coaptation of the tricuspid valve leaflets. 9. The pulmonic valve is structurally normal. There is no significant pulmonic insufficiency. 10. There is no evidence of pulmonary hypertension. 11. No pericardial effusion, intracardiac thrombi, or masses are seen. Pacemaker wire noted. 12. The atrial septum is structurally normal. No shunt by color flow doppler. CONCLUSION: Normal left ventricular systolic function. Dilated right ventricle with normal right ventricular function. Biatrial enlargement. Mild to moderate mitral regurgitation. Severe tricuspid regurgitation. Pacemaker wire noted. Audley Hose, MD 03/12/2016 4:22 PM     Xr Chest 2 Views    03/11/2016  CLINICAL INFORMATION: 80 year old. Short of breath. TECHNIQUE AND FINDINGS: Chest: AP upright and sitting  lateral. Comparison with 03/07/2016. Intact 2-lead pacemaker entering from the left. Stable moderate to marked cardiomegaly. Evidence of vascular congestion but no pulmonary edema. Small right pleural effusion. No left pleural effusion or pneumothorax. No pneumonia or atelectasis.     03/11/2016   Improvement compared with 03/07/2016. Current study demonstrates moderate cardiomegaly with vascular congestion and small right pleural effusion. No pneumonia or other acute process demonstrated. Annabell Sabal, MD 03/11/2016 10:33 PM     Chest 2 Views    03/07/2016  HISTORY: Chest pain COMPARISON: 03/31/2015 FINDINGS: PA and lateral views of the chest were obtained. There are focal airspace opacities at the left lung base. Stable cardiomegaly. Possible trace left pleural effusion. No appreciable pneumothorax. Stable appearance of left-sided pacemaker with leads ending in the expected location of the right atrium and right ventricle.     03/07/2016   Findings concerning for left lower lobe pneumonia. Possible trace left pleural effusion. Follow-up x-ray is recommended to ensure resolution. Anne Hahn, MD 03/07/2016 1:53 PM     US Venous Duplex Doppler Leg Bilateral    03/07/2016  HISTORY:  Bilateral leg swelling. COMPARISON:  03/31/2015 FINDINGS: Evaluation was somewhat limited due to swelling, especially of the calf veins. Deep veins from the bilateral common femoral to popliteal trifurcation demonstrate normal compressibility, color and Doppler flow, and augmentation.  The origin of the profunda femoral vein and visualized calf veins also appear patent.     03/07/2016  No evidence for DVT in the lower extremities bilaterally. Anne Hahn, MD 03/07/2016 3:18 PM     Pathology:   Specimens     None        Pending Lab Results:   Labs/Images to be followed at your PCP office:   Unresulted Labs     None         Hospitalist:   Signed by: Marella Chimes  03/24/2016 8:57 AM  Time spent for discharge: 31  minutes      *This note was  generated by the Epic EMR system/ Dragon speech recognition and may contain inherent errors or omissions not intended by the user. Grammatical errors, random word insertions, deletions, pronoun errors and incomplete sentences are occasional consequences of this technology due to software limitations. Not all errors are caught or corrected. If there are questions or concerns about the content of this note or information contained within the body of this dictation they should be addressed directly with the author for clarification

## 2016-03-13 NOTE — Progress Notes (Signed)
Call to [facility] liaison and spoke to [place] to request nebulizer machine.

## 2016-03-13 NOTE — Plan of Care (Signed)
Problem: Safety  Goal: Patient will be free from injury during hospitalization  Outcome: Progressing  Fall precautions in place. Non-skid socks on. Call bell within reach.     Problem: Pain  Goal: Patient's pain/discomfort is manageable  Outcome: Progressing  Pt has no c/o pain.     Problem: Inadequate breathing pattern (Asthma)  Goal: Effective ventilation maintained  Intervention: Teach/Reinforce use of incentive spirometer 10 times per hour while awake, cough and deep breath as needed.  Encouraged pt to use IS 10x/hr.   Intervention: Encourage ambulation/activity per patient's ability.  Pt is ambulating with steady gait. O2 sats stable @ 98% on RA on ambulation.

## 2016-03-13 NOTE — Progress Notes (Signed)
Referral received for patient to have a nebulizer machine prior to discharge.    [facility] MD-Nebulizer delivered to patient from [facility] MD [phone]

## 2016-03-18 ENCOUNTER — Ambulatory Visit (INDEPENDENT_AMBULATORY_CARE_PROVIDER_SITE_OTHER): Payer: Medicare (Managed Care) | Admitting: "Endocrinology

## 2016-03-18 DIAGNOSIS — G4733 Obstructive sleep apnea (adult) (pediatric): Secondary | ICD-10-CM | POA: Diagnosis not present

## 2016-03-19 DIAGNOSIS — I2510 Atherosclerotic heart disease of native coronary artery without angina pectoris: Secondary | ICD-10-CM | POA: Diagnosis not present

## 2016-03-19 DIAGNOSIS — I071 Rheumatic tricuspid insufficiency: Secondary | ICD-10-CM | POA: Diagnosis not present

## 2016-03-19 DIAGNOSIS — E782 Mixed hyperlipidemia: Secondary | ICD-10-CM | POA: Diagnosis not present

## 2016-03-19 DIAGNOSIS — I482 Chronic atrial fibrillation: Secondary | ICD-10-CM | POA: Diagnosis not present

## 2016-03-25 DIAGNOSIS — G4733 Obstructive sleep apnea (adult) (pediatric): Secondary | ICD-10-CM | POA: Diagnosis not present

## 2016-03-25 DIAGNOSIS — I071 Rheumatic tricuspid insufficiency: Secondary | ICD-10-CM | POA: Diagnosis not present

## 2016-03-25 DIAGNOSIS — R0609 Other forms of dyspnea: Secondary | ICD-10-CM | POA: Diagnosis not present

## 2016-03-25 DIAGNOSIS — R05 Cough: Secondary | ICD-10-CM | POA: Diagnosis not present

## 2016-03-27 DIAGNOSIS — Z9989 Dependence on other enabling machines and devices: Secondary | ICD-10-CM | POA: Diagnosis not present

## 2016-03-27 DIAGNOSIS — E782 Mixed hyperlipidemia: Secondary | ICD-10-CM | POA: Diagnosis not present

## 2016-03-27 DIAGNOSIS — I10 Essential (primary) hypertension: Secondary | ICD-10-CM | POA: Diagnosis not present

## 2016-03-27 DIAGNOSIS — I482 Chronic atrial fibrillation: Secondary | ICD-10-CM | POA: Diagnosis not present

## 2016-03-27 DIAGNOSIS — G4733 Obstructive sleep apnea (adult) (pediatric): Secondary | ICD-10-CM | POA: Diagnosis not present

## 2016-03-27 DIAGNOSIS — F5104 Psychophysiologic insomnia: Secondary | ICD-10-CM | POA: Diagnosis not present

## 2016-03-27 DIAGNOSIS — Z79899 Other long term (current) drug therapy: Secondary | ICD-10-CM | POA: Diagnosis not present

## 2016-04-09 ENCOUNTER — Other Ambulatory Visit: Payer: Self-pay | Admitting: *Deleted

## 2016-04-09 DIAGNOSIS — I10 Essential (primary) hypertension: Secondary | ICD-10-CM

## 2016-04-09 NOTE — Patient Outreach (Signed)
[facility]) Care Management  04/09/2016  Myisha Obenauf [birth year] PJ:[id number]   Subjective: Telephone call to patient's home, spoke with patient, and HIPAA verified.   Patient states she is doing well.  Patient gave [facility] verbal authorization to speak with daughter Jerrye Beavers) regarding healthcare needs as needed.  Discussed [facility] Care Management services and patient in agreement to receive services.  Patient states she has a history of hypertension, enlarged heart, 2 leaky valves, having a lot of fluid, and has not been given a diagnosis of congestive heart failure.  Patient states she does weigh herself daily and weight today we 205 lbs.  Patient states she has the following durable medical equipment that she uses periodically: rollator and cane.  Patient states she just returned to her home in [place]  on 04/08/16 after visiting daughter in [place].  Patient states her physical home address is [address] [place] [address].  Patient states she has all of her mail sent to her daughter's mailing address in [place] ([address], [place] [address]) since she travels back and forth to [place] throughout the year.  Patient states she is planning to be at her [place] home until September of 2017 then she will return to daughter's home through March of 2018.  Patient states her daughter wants her to move to [place] permanently but she is not wanting to at this point in time, may consider in the future.    RNCM discussed how benefit may vary between in and out of network providers.   Patient voices understanding, is aware that in-network providers are covered at a higher benefit level, her insurance coverage for emergency care, urgent care, and emergency inpatient care.  Patient states she is planning to get all of her routine care visit in [place] prior to returning to [place].   Patient states her primary care MD is Dr. Fulton Reek in [place] and her last office visit was 03/27/16.   States her  last visit with cardiologist was on 03/19/16.  Patient states her last office visit with pulmonologist Dr. Lowella Grip was on 03/25/16.    Patient states she also has providers in [place] that she utilizes if needed.   Patient in agreement to referral to [facility] for transition of care due to recent hospitalization, possible disease monitoring and education.  Patient states she was hospitalized in [place]  03/07/16 - 03/13/16 for pneumonia.     Patient in agreement to referral to [facility] for medication review , discuss concerns with possible donut hole and review possible options for avoiding it.  Patient states she has made payment arrangements with hospital in [place] for outstanding hospital bills and does not need [facility] Social Worker resources at this time.  Patient will continue to receive [facility] services at this time.   Patient has been given RNCM's contact number, [facility] Care Management main number, and 24 hour Nurse Advice line number for future reference.   Objective: Per chart review: no hospitalization in chart for 2016.    Assessment: Received [facility] Management referral on 04/07/16.   Referral source: Jettie Booze.   Reason for referral: Disease and symptom management. Wears pacemaker.  [facility]/ [facility] hospital diagnosed with pneumonia, length of stay 6 days.   Comorbidities CA, CHF.     Hospital discharge on 03/13/16.   1 ED visit in 6 months.   Services requested:  [place].   Plan: RNCM will  refer patient to [facility] for transition of care due to recent hospitalization, possible disease monitoring and education.   RNCM will refer patient to [facility] for medication review and to discuss possible alternatives for avoiding the donut hole.  RNCM will send request to Josepha Pigg at [facility] to update patient's primary MD to Dr. Fulton Reek and her insurance provider to [facility] in the  chart.   Also advised patient has a physical address in [place] and wants mailing address to continue to be [place] address.   Garnetta Fedrick H. Annia Friendly, BSN, [facility] Telephonic CM Phone: [phone] Fax: [phone]

## 2016-04-12 DIAGNOSIS — I480 Paroxysmal atrial fibrillation: Secondary | ICD-10-CM | POA: Diagnosis not present

## 2016-04-12 DIAGNOSIS — E785 Hyperlipidemia, unspecified: Secondary | ICD-10-CM | POA: Diagnosis not present

## 2016-04-12 DIAGNOSIS — J189 Pneumonia, unspecified organism: Secondary | ICD-10-CM | POA: Diagnosis not present

## 2016-04-12 DIAGNOSIS — R600 Localized edema: Secondary | ICD-10-CM | POA: Diagnosis not present

## 2016-04-15 ENCOUNTER — Other Ambulatory Visit: Payer: Self-pay | Admitting: *Deleted

## 2016-04-15 NOTE — Patient Outreach (Signed)
Phone call to pt, follow up on referral received from [facility] telephonic RN CM (recent hospital discharge, community nurse case management services).   Spoke with pt, HIPPA verified.   As discussed with pt, plan to f/u  5/12- home visit.  Residential address provided: [address] [place] [address].  [address]     Zara Chess.   [facility]  [phone]

## 2016-04-17 DIAGNOSIS — G4733 Obstructive sleep apnea (adult) (pediatric): Secondary | ICD-10-CM | POA: Diagnosis not present

## 2016-04-18 ENCOUNTER — Other Ambulatory Visit: Payer: Self-pay | Admitting: *Deleted

## 2016-04-18 ENCOUNTER — Encounter: Payer: Self-pay | Admitting: *Deleted

## 2016-04-18 VITALS — BP 120/70 | HR 75 | Resp 28 | Ht 63.0 in | Wt 214.0 lb

## 2016-04-18 DIAGNOSIS — Z9989 Dependence on other enabling machines and devices: Secondary | ICD-10-CM | POA: Diagnosis not present

## 2016-04-18 DIAGNOSIS — G4733 Obstructive sleep apnea (adult) (pediatric): Secondary | ICD-10-CM | POA: Diagnosis not present

## 2016-04-18 DIAGNOSIS — R05 Cough: Secondary | ICD-10-CM | POA: Diagnosis not present

## 2016-04-18 DIAGNOSIS — I517 Cardiomegaly: Secondary | ICD-10-CM

## 2016-04-18 DIAGNOSIS — I10 Essential (primary) hypertension: Secondary | ICD-10-CM

## 2016-04-18 NOTE — Patient Outreach (Signed)
Follow up phone call - voice message left.    Zara Chess.   [facility] Management  [phone]

## 2016-04-21 ENCOUNTER — Encounter: Payer: Self-pay | Admitting: *Deleted

## 2016-04-21 DIAGNOSIS — J189 Pneumonia, unspecified organism: Secondary | ICD-10-CM | POA: Insufficient documentation

## 2016-04-21 DIAGNOSIS — I10 Essential (primary) hypertension: Secondary | ICD-10-CM | POA: Insufficient documentation

## 2016-04-21 DIAGNOSIS — I517 Cardiomegaly: Secondary | ICD-10-CM | POA: Insufficient documentation

## 2016-04-21 NOTE — Patient Outreach (Signed)
[facility]) Care Management   Home visit done 04/18/16  [name] Dec 19, 1935 [id number]  Cynthia Wallace is an 80 y.o. female  Subjective:  Pt reports f/u with Dr. Raul Del today, received a good report, was told to titrate Prednisone but not given  Instructions how.   Pt reports she stays with her daughter in [place] 6 months which is when she had her recent hospitalization for Pneumonia (discharged 4/6).  Pt reports she has been doing well since coming home.    Pt reports her daughter would like her to stay with her year round, but likes being on her own.    Objective:   Filed Vitals:   04/18/16 1657  BP: 120/70  Pulse: 75  Resp: 28    ROS  Physical Exam  Constitutional: She is oriented to person, place, and time. She appears well-developed and well-nourished.  Cardiovascular: Normal rate, regular rhythm and normal heart sounds.   Respiratory: Effort normal and breath sounds normal.  GI: Soft.  Musculoskeletal: Normal range of motion.  Neurological: She is alert and oriented to person, place, and time.  Skin: Skin is warm and dry.  Psychiatric: She has a normal mood and affect. Her behavior is normal. Judgment and thought content normal.    Encounter Medications:   Outpatient Encounter Prescriptions as of 04/18/2016  Medication Sig  . Albuterol Sulfate 108 (90 Base) MCG/ACT AEPB Inhale into the lungs. As needed for sob  . atorvastatin (LIPITOR) 10 MG tablet Take 10 mg by mouth.  . B Complex-C (SUPER B COMPLEX PO) Take by mouth.  . budesonide-formoterol (SYMBICORT) 160-4.5 MCG/ACT inhaler Inhale 2 puffs into the lungs 2 (two) times daily.  . Cholecalciferol 10000 units CAPS Take by mouth.  . digoxin (LANOXIN) 0.125 MG tablet Take by mouth daily.  . furosemide (LASIX) 20 MG tablet Take 20 mg by mouth. Pt to take 2 tablets once a day  . gabapentin (NEURONTIN) 100 MG capsule Take 100 mg by mouth 3 (three) times daily.  Marland Kitchen HYDROcodone-acetaminophen  (NORCO/VICODIN) 5-325 MG tablet Take 1 tablet by mouth every 6 (six) hours as needed.  Marland Kitchen losartan (COZAAR) 50 MG tablet Take 50 mg by mouth daily.  . Melatonin 10 MG TABS Take by mouth. Takes at night  . metoprolol succinate (TOPROL-XL) 50 MG 24 hr tablet Take 50 mg by mouth 2 (two) times daily.  . montelukast (SINGULAIR) 10 MG tablet Take 10 mg by mouth at bedtime.  . potassium chloride (K-DUR) 10 MEQ tablet Take 10 mEq by mouth daily.  . predniSONE (DELTASONE) 10 MG tablet Take 10 mg by mouth daily with breakfast.  . rivaroxaban (XARELTO) 20 MG TABS tablet Take 20 mg by mouth daily with supper.  . traZODone (DESYREL) 50 MG tablet Take 50 mg by mouth at bedtime.   No facility-administered encounter medications on file as of 04/18/2016.    Functional Status:   In your present state of health, do you have any difficulty performing the following activities: 04/18/2016 04/18/2016  Hearing? - N  Vision? - Y  Difficulty concentrating or making decisions? - N  Walking or climbing stairs? - N  Dressing or bathing? - N  Doing errands, shopping? - N  Conservation officer, nature and eating ? - N  Using the Toilet? - N  In the past six months, have you accidently leaked urine? N -  Do you have problems with loss of bowel control? N -  Managing your Medications? - N  Managing your Finances?  N -  Housekeeping or managing your Housekeeping? N N    Fall/Depression Screening:    PHQ 2/9 Scores 04/18/2016  PHQ - 2 Score 0    Assessment:  Pleasant 80 year old female, resides by herself in an apartment for 6 months, resides with daughter in  [place] remaining 6 months.                            Hx of Asthma, chronic bronchitis- recent hospitalization for Pneumonia (in [place] while staying with                               Daughter.  Lungs clear, no c/o sob, pain.  Pt called Dr. Gust Brooms office during home visit- clarify                              Prednisone tapering, office already closed.                           Hx of HTN- BP today 120/70   Plan:  Pt to call Dr. Raul Del, f/u on Prednisone tapering.             Plan to  Continue to follow pt for transition of care- as discussed with pt, coworker Journalist, newspaper CM                 (covering for this RN CM) will call her next week.             Plan to inform Dr. Doy Hutching of [facility] involvement- fax barrier letter/route encounter.   [facility] CM Care Plan Problem One        Most Recent Value   Care Plan Problem One  Risk for readmission related to recent hospitalization    Role Documenting the Problem One  Care Management [place] for Problem One  Active   [facility] Long Term Goal (31-90 days)  Pt would not readmit in next 31 days    [facility] Long Term Goal Start Date  04/18/16   Interventions for Problem One Long Term Goal  Home visit done- part of transition of care,    [facility] CM Short Term Goal #1 (0-30 days)  Pt would f/u with Dr. Raul Del about Prednisone taper in the next 7 days    [facility] CM Short Term Goal #1 Start Date  04/18/16   Interventions for Short Term Goal #1  Discussed with pt calling Dr. Raul Del to clarify how to taper Prednisone,    [facility] CM Short Term Goal #2 (0-30 days)  Pt would not have any issues with breathing in the next 30 days    [facility] CM Short Term Goal #2 Start Date  04/18/16   Interventions for Short Term Goal #2  Reviewed with pt ongoing compliance with respiratory medications.     [facility] CM Care Plan Problem Two        Most Recent Value   Care Plan Problem Two  Hx of HTN    Role Documenting the Problem Two  Care Management Coordinator   Care Plan for Problem Two  Active   [facility] CM Short Term Goal #1 (0-30 days)  Pt would have better understanding of Low Na+ diet in the next 21 days    [facility] CM  Short Term Goal #1 Start Date  04/18/16   Interventions for Short Term Goal #2   Provided pt with information on Low Na+ diet, reviewed foods to avoid/allowed,  reading labels.      Zara Chess.   [place] Care Management  [phone]

## 2016-04-22 ENCOUNTER — Other Ambulatory Visit: Payer: Self-pay | Admitting: Pharmacist

## 2016-04-22 NOTE — Patient Outreach (Signed)
[facility] [facility]) Care Management  [place]   04/22/2016  Cynthia Wallace [birth year] [id number]  Subjective: Cynthia Wallace is a 80yo who was referred to [place] for medication review and to discuss possible alternatives for avoiding the donut hole.    Phone call to patient.  Verified name and date of birth.  Explained purpose of call to review medications as well as discuss medication assistance with patient and educate patient about the donut hole.  Reviewed all medications with patient.  Patient reports adherence with medications.    During phone call, patient's phone suddenly disconnected.  I attempted multiple phone calls to patient following disconnection, but phone went straight to voicemail.  I believe patient's phone may have died.  I left a HIPAA compliant voicemail for patient to return my phone call.    Objective:   Encounter Medications: Outpatient Encounter Prescriptions as of 04/22/2016  Medication Sig Note  . albuterol (PROVENTIL) (2.5 MG/3ML) 0.083% nebulizer solution Take 2.5 mg by nebulization every 6 (six) hours as needed for wheezing or shortness of breath. 04/22/2016: Has on hand if needed.   . Albuterol Sulfate 108 (90 Base) MCG/ACT AEPB Inhale into the lungs. As needed for sob   . atorvastatin (LIPITOR) 10 MG tablet Take 10 mg by mouth.   . B Complex-C (SUPER B COMPLEX PO) Take by mouth.   . budesonide-formoterol (SYMBICORT) 160-4.5 MCG/ACT inhaler Inhale 2 puffs into the lungs 2 (two) times daily.   . Cholecalciferol 10000 units CAPS Take by mouth.   . digoxin (LANOXIN) 0.125 MG tablet Take by mouth daily.   . furosemide (LASIX) 20 MG tablet Take 20 mg by mouth. Pt to take 2 tablets once a day   . gabapentin (NEURONTIN) 100 MG capsule Take 100 mg by mouth 3 (three) times daily.   Marland Kitchen losartan (COZAAR) 50 MG tablet Take 50 mg by mouth daily.   . Melatonin 10 MG TABS Take by mouth. Takes at night   . metoprolol succinate (TOPROL-XL) 50 MG 24 hr  tablet Take 50 mg by mouth 2 (two) times daily.   . montelukast (SINGULAIR) 10 MG tablet Take 10 mg by mouth at bedtime.   . potassium chloride (K-DUR) 10 MEQ tablet Take 10 mEq by mouth daily.   . predniSONE (DELTASONE) 10 MG tablet Take 10 mg by mouth daily with breakfast. 04/22/2016: Patient was instructed to start tapering medication - to take 1/2 tablet (5 mg) for 7 days then stop medication. Started taper yesterday.   . rivaroxaban (XARELTO) 20 MG TABS tablet Take 20 mg by mouth daily with supper.   . traZODone (DESYREL) 50 MG tablet Take 50 mg by mouth at bedtime.   Marland Kitchen HYDROcodone-acetaminophen (NORCO/VICODIN) 5-325 MG tablet Take 1 tablet by mouth every 6 (six) hours as needed. Reported on 04/22/2016 04/22/2016: Reports has not taken any since she got out of the hospital    No facility-administered encounter medications on file as of 04/22/2016.   Functional Status: In your present state of health, do you have any difficulty performing the following activities: 04/18/2016 04/18/2016  Hearing? - N  Vision? - Y  Difficulty concentrating or making decisions? - N  Walking or climbing stairs? - N  Dressing or bathing? - N  Doing errands, shopping? - N  Conservation officer, nature and eating ? - N  Using the Toilet? - N  In the past six months, have you accidently leaked urine? N -  Do you have problems with loss  of bowel control? N -  Managing your Medications? - N  Managing your Finances? N -  Housekeeping or managing your Housekeeping? N N   Fall/Depression Screening: PHQ 2/9 Scores 04/18/2016  PHQ - 2 Score 0    Assessment: 1.  Medication review:  Drugs sorted by system:  Neurologic/Psychologic: trazodone  Cardiovascular: atorvastatin, digoxin, furosemide, losartan, metoprolol, potassium, rivaroxaban  Pulmonary/Allergy: albuterol HFA and nebulizer, Symbicort, montelukast  Pain: hydrocodone-acetaminophen PRN (has not taken any in >1 month), gabapentin  Vitamins/Minerals: b complex-c,  cholecalciferol  Miscellaneous: melatonin, prednisone   Duplications in therapy: none noted Gaps in therapy: none noted Medications to avoid in the elderly: diazepam (increase risk of cognitive impairment, delirium, falls, fractures, and motor vehicle crashes in older adults) - patient reports she was prescribed diazepam vaginal suppository by her urologist (compounded at compounding pharmacy) to use for pelvic spasms.  Patient reports she only takes as needed and has not taken in > 6 months.  Counseled patient on adverse effects of benzodiazepines especially when combined with opioid such as hydrocodone, and patient voices understanding.   Drug interactions: losartan and potassium - may increase risk of hyperkalemia (most recent potassium = 4.4 on 03/27/16) Other issues noted: none - patient recently had digoxin level (0.5 on 03/27/16)   2.  Medication assistance:  Unable to assess as patient's phone disconnected.  Patient did report she has all medications at this time and her main concern is cost of medications when she is in the donut hole.     Plan: 1.  Medication review:  No issues noted and no recommendations for any changes.   2.  Medication assistance:  Unable to assess medication assistance needs as patient's phone disconnected during the phone call.  I made several attempts to reach patient after her phone disconnected, but patient's phone went straight to voicemail.  Left a HIPAA complaint voicemail for patient to return my phone call.  Will call patient in 1-2 days if she does not return my phone call.     Elisabeth Most, Pharm.D. Pharmacy Resident [place] [phone]

## 2016-04-23 ENCOUNTER — Ambulatory Visit
Admission: RE | Admit: 2016-04-23 | Discharge: 2016-04-23 | Disposition: A | Discharge status: Home / Self Care | Payer: PPO | Source: Ambulatory Visit | Attending: Internal Medicine | Admitting: Internal Medicine

## 2016-04-23 ENCOUNTER — Other Ambulatory Visit: Payer: Self-pay | Admitting: Pharmacist

## 2016-04-23 ENCOUNTER — Other Ambulatory Visit: Payer: Self-pay | Admitting: Internal Medicine

## 2016-04-23 DIAGNOSIS — Z1231 Encounter for screening mammogram for malignant neoplasm of breast: Secondary | ICD-10-CM

## 2016-04-23 NOTE — Patient Outreach (Signed)
[facility]) Care Management  [place]   04/23/2016  Giorgia Cejka [birth year] [id number]  Subjective: Cynthia Wallace is a 80 yo who was referred to [place] for medication review and to discuss possible alternatives for avoiding the donut hole.  I completed medication review with patient on 04/22/16.  Patient's phone disconnected prior to discussing medication assistance.    Phone call to patient.  Patient confirms her phone died yesterday during phone conversation.  Patient verified name, date of birth, and address.  Discussed medication assistance with patient.  Educated her about the donut hole.  Patient confirms she only has difficulty affording her medications when she is in the donut hole, and she is not in the donut hole at this time.    Objective:   Encounter Medications: Outpatient Encounter Prescriptions as of 04/23/2016  Medication Sig Note  . albuterol (PROVENTIL) (2.5 MG/3ML) 0.083% nebulizer solution Take 2.5 mg by nebulization every 6 (six) hours as needed for wheezing or shortness of breath. 04/22/2016: Has on hand if needed.   . Albuterol Sulfate 108 (90 Base) MCG/ACT AEPB Inhale into the lungs. As needed for sob   . atorvastatin (LIPITOR) 10 MG tablet Take 10 mg by mouth.   . B Complex-C (SUPER B COMPLEX PO) Take by mouth.   . budesonide-formoterol (SYMBICORT) 160-4.5 MCG/ACT inhaler Inhale 2 puffs into the lungs 2 (two) times daily.   . Cholecalciferol 10000 units CAPS Take by mouth.   . digoxin (LANOXIN) 0.125 MG tablet Take by mouth daily.   . furosemide (LASIX) 20 MG tablet Take 20 mg by mouth. Pt to take 2 tablets once a day   . gabapentin (NEURONTIN) 100 MG capsule Take 100 mg by mouth 3 (three) times daily.   Marland Kitchen HYDROcodone-acetaminophen (NORCO/VICODIN) 5-325 MG tablet Take 1 tablet by mouth every 6 (six) hours as needed. Reported on 04/22/2016 04/22/2016: Reports has not taken any since she got out of the hospital   . losartan (COZAAR) 50  MG tablet Take 50 mg by mouth daily.   . Melatonin 10 MG TABS Take by mouth. Takes at night   . metoprolol succinate (TOPROL-XL) 50 MG 24 hr tablet Take 50 mg by mouth 2 (two) times daily.   . montelukast (SINGULAIR) 10 MG tablet Take 10 mg by mouth at bedtime.   . NONFORMULARY OR COMPOUNDED ITEM Place 5 mg vaginally as needed (for spasms). Reported on 04/22/2016 04/22/2016: Patient reports she has on hand if needed but has not taken in >6 months.   . potassium chloride (K-DUR) 10 MEQ tablet Take 10 mEq by mouth daily.   . predniSONE (DELTASONE) 10 MG tablet Take 10 mg by mouth daily with breakfast. 04/22/2016: Patient was instructed to start tapering medication - to take 1/2 tablet (5 mg) for 7 days then stop medication. Started taper yesterday.   . rivaroxaban (XARELTO) 20 MG TABS tablet Take 20 mg by mouth daily with supper.   . traZODone (DESYREL) 50 MG tablet Take 50 mg by mouth at bedtime.    No facility-administered encounter medications on file as of 04/23/2016.    Functional Status: In your present state of health, do you have any difficulty performing the following activities: 04/18/2016 04/18/2016  Hearing? - N  Vision? - Y  Difficulty concentrating or making decisions? - N  Walking or climbing stairs? - N  Dressing or bathing? - N  Doing errands, shopping? - N  Conservation officer, nature and eating ? - N  Using  the Toilet? - N  In the past six months, have you accidently leaked urine? N -  Do you have problems with loss of bowel control? N -  Managing your Medications? - N  Managing your Finances? N -  Housekeeping or managing your Housekeeping? N N    Fall/Depression Screening: PHQ 2/9 Scores 04/18/2016  PHQ - 2 Score 0    Assessment: Medication assistance:  Patient has Dispensing optician [employer] and fills prescriptions at [facility].  Patient reports most medications are $4 except for Symbicort and Xarelto which cost $45 per month (tier 3 medications).  Patient denies  difficulty affording these medications unless she is in the donut hole.  Discussed [employer] insurance plan and informed patient she could get a 90 day supply of her medications for $90 which would save her money in the long run.  Patient voiced understanding and may discuss filling 90 day prescriptions with her provider.  Also discussed Extra Help.  Based on patient's income, informed her that she may qualify for partial Extra Help.  Patient initially agreed to complete application; however, when questions about resources/assets came up patient did not wish to disclose that information and decided she no longer wished to complete application.  Discussed patient assistance programs but informed patient that programs for Symbicort and Xarelto require patient to submit out of pocket expense report from her pharmacy and she must meet a certain amount spent out of pocket (5% for Symbicort).  Patient does not feel like she would qualify at this time.  Advised patient to call me if she goes into the donut hole and I can assist her in completing patient assistance applications.  Patient denies any further pharmacy needs at this time.    Plan: Will close pharmacy program as no further intervention needed.  Provided patient with my contact information and advised her to call me if she wishes to complete Extra Help application in the future or if she wants to pursue patient assistance programs for medications when she is in the donut hole.  Patient voiced understanding.  I will update [facility] CMRN Rose Pierzchala.     Elisabeth Most, Pharm.D. Pharmacy Resident [place] [phone]

## 2016-04-24 ENCOUNTER — Other Ambulatory Visit: Payer: Self-pay | Admitting: *Deleted

## 2016-04-24 NOTE — Patient Outreach (Signed)
[facility]) Care Management [place] Telephone Outreach, Transition of Care 04/24/2016  Cynthia Wallace [birth year] [id number]  Successful telephone outreach to Ms. Cynthia Wallace, 80 y/o female, referred to [place] by [facility] telephonic CM after recent hospital discharge.  HIPAA verified.  Today, Cynthia Wallace reports that she is "doing very well, and feeling good."  Cynthia Wallace reports that she is taking all of her medications as prescribed and stated that she "got her prednisone" dosing straightened out.  Patient reports that her doctor advised her to take Prednisone 5 mg po QD x 1 week, and Cynthia Wallace states that is how she has been taking it.  Cynthia Wallace stated that her legs "are just barely" swollen, and reported an issue of weakness in her legs when she "walked to her neighbor's house, and when I bend down."  Cynthia Wallace states that this weakness is not "too bad," and "only happens when I've been active."  I advised her to go slow with activity and to report this symptom to her doctors, which she said she planned to do.  Cynthia Wallace reported that her weight this morning was 212 lbs.  Cynthia Wallace stated that she got her mammogram completed yesterday, and has an appointment tomorrow to follow up on a colonoscopy which said "was due;"  She reported that she hopes to have the colonoscopy scheduled during her appointment tomorrow.  I let Cynthia Wallace know that I would make Cynthia Wallace, [facility] RN CM, aware of our conversation today, and that would Rose would follow up with her in the future for [place]; patient was agreeable, and denied further concerns, needs, issues, or problems.  Oneta Rack, RN, BSN, [employer] [facility] Care Management  [phone]

## 2016-04-25 DIAGNOSIS — Z86010 Personal history of colonic polyps: Secondary | ICD-10-CM | POA: Diagnosis not present

## 2016-05-02 ENCOUNTER — Other Ambulatory Visit: Payer: Self-pay | Admitting: *Deleted

## 2016-05-02 NOTE — Patient Outreach (Signed)
Transition of care call successful.  Pt reports her weights are up and down, today 212 lbs, no edema.  Pt reports to have colonoscopy done 7/24.  Pt reports  results of recent mammogram done were mailed to her daughter' address, daughter text her today- results good.  Pt reports no issues with coughing.  Pt reports have been having some light headedness, question if need eyes checked.  Pt reports she is struggling with doing Low Na+ diet.   As discussed with pt, plan to f/u again telephonically 6/2- part of ongoing transition of care.    Zara Chess.   [place] Care Management  [phone]

## 2016-05-09 ENCOUNTER — Other Ambulatory Visit: Payer: Self-pay | Admitting: *Deleted

## 2016-05-09 NOTE — Patient Outreach (Signed)
Outreach phone call  Successful, part of ongoing transition of care.  Spoke with Cynthia Wallace, reports has a little edema, weights up and down, today 218 lbs (goes out to eat with friends).  Cynthia Wallace reports no problems breathing, as far as weakness in legs, some days better, cannot do a lot of walking.   Cynthia Wallace reports she is using pepper now with her eggs.  Cynthia Wallace reports plan to go on a one day bus trip to [place] tomorrow, will only take one Lasix, go back to taking two the following day.  Cynthia Wallace reports dizziness stopped once she quit taking the vinegar pills.   As discussed, plan to f/u again telephonically 6/7 - part of ongoing transition of care.    Cynthia Wallace.   [place] Care Management  [phone]

## 2016-05-13 DIAGNOSIS — E785 Hyperlipidemia, unspecified: Secondary | ICD-10-CM | POA: Diagnosis not present

## 2016-05-13 DIAGNOSIS — J189 Pneumonia, unspecified organism: Secondary | ICD-10-CM | POA: Diagnosis not present

## 2016-05-13 DIAGNOSIS — I480 Paroxysmal atrial fibrillation: Secondary | ICD-10-CM | POA: Diagnosis not present

## 2016-05-13 DIAGNOSIS — R600 Localized edema: Secondary | ICD-10-CM | POA: Diagnosis not present

## 2016-05-16 ENCOUNTER — Other Ambulatory Visit: Payer: Self-pay | Admitting: *Deleted

## 2016-05-16 NOTE — Patient Outreach (Signed)
Transition of care call - (transition of care program started 5/12- referral from [facility] telephonic RN CM, f/u on hospitalization).    Spoke with pt who reports ankles swelling, was on a trip plus had a function at church, had feet up for 3 days, swelling down but started again  yesterday.  Pt reports weight is up, 224 lbs. Pt reports no issues with breathing, taking her Lasix as ordered  except will  take one pill  when going to church. Pt reports recently had canned chicken noodle soup to which RN CM discussed high in sodium, reinforced reading food labels for sodium content.    RN CM discussed with pt plan to f/u again 6/12- final transition of care call.     Zara Chess.   [place] Care Management  [phone]

## 2016-05-21 ENCOUNTER — Other Ambulatory Visit: Payer: Self-pay | Admitting: *Deleted

## 2016-05-21 ENCOUNTER — Encounter: Payer: Self-pay | Admitting: *Deleted

## 2016-05-21 NOTE — Patient Outreach (Signed)
Final transition of care call successful.   Spoke with pt who reports on ankle swelling, some improvement,down at bedtime, goes back up depending on how much she is on it.  Pt reports she is watching her salt except when she eats out.  Pt reports no change in sob when walking.   Pt inquired if she should use her Albuterol inhaler when she is sob to which RN CM informed pt Albuterol is a  rescue inhaler- to be used as needed for sob to which pt voiced understanding.  Pt reports next MD visit is in July.  RN CM discussed with pt for increased sob, edema, to call MD to which pt voiced understanding.  RN CM discussed with pt today final transition of care call, plan to close her  case- no further case management needs.    Plan to close case- goals met. Plan to fax  Dr. Doy Hutching case closure letter.  As discussed with pt, case closure letter to be sent, includes [facility]'s main office number to call if needs arise in the future.  Plan to inform [facility] care management assistant to close case.   Zara Chess.   [place] Care Management  [phone]

## 2016-05-27 DIAGNOSIS — I495 Sick sinus syndrome: Secondary | ICD-10-CM | POA: Diagnosis not present

## 2016-06-12 DIAGNOSIS — E785 Hyperlipidemia, unspecified: Secondary | ICD-10-CM | POA: Diagnosis not present

## 2016-06-12 DIAGNOSIS — I480 Paroxysmal atrial fibrillation: Secondary | ICD-10-CM | POA: Diagnosis not present

## 2016-06-12 DIAGNOSIS — R600 Localized edema: Secondary | ICD-10-CM | POA: Diagnosis not present

## 2016-06-12 DIAGNOSIS — J189 Pneumonia, unspecified organism: Secondary | ICD-10-CM | POA: Diagnosis not present

## 2016-06-18 DIAGNOSIS — Z9989 Dependence on other enabling machines and devices: Secondary | ICD-10-CM | POA: Diagnosis not present

## 2016-06-18 DIAGNOSIS — I482 Chronic atrial fibrillation: Secondary | ICD-10-CM | POA: Diagnosis not present

## 2016-06-18 DIAGNOSIS — I10 Essential (primary) hypertension: Secondary | ICD-10-CM | POA: Diagnosis not present

## 2016-06-18 DIAGNOSIS — Z0000 Encounter for general adult medical examination without abnormal findings: Secondary | ICD-10-CM | POA: Diagnosis not present

## 2016-06-18 DIAGNOSIS — G4733 Obstructive sleep apnea (adult) (pediatric): Secondary | ICD-10-CM | POA: Diagnosis not present

## 2016-06-18 DIAGNOSIS — R600 Localized edema: Secondary | ICD-10-CM | POA: Diagnosis not present

## 2016-07-13 DIAGNOSIS — R600 Localized edema: Secondary | ICD-10-CM | POA: Diagnosis not present

## 2016-07-13 DIAGNOSIS — I480 Paroxysmal atrial fibrillation: Secondary | ICD-10-CM | POA: Diagnosis not present

## 2016-07-13 DIAGNOSIS — J189 Pneumonia, unspecified organism: Secondary | ICD-10-CM | POA: Diagnosis not present

## 2016-07-13 DIAGNOSIS — E785 Hyperlipidemia, unspecified: Secondary | ICD-10-CM | POA: Diagnosis not present

## 2016-07-17 DIAGNOSIS — I071 Rheumatic tricuspid insufficiency: Secondary | ICD-10-CM | POA: Diagnosis not present

## 2016-07-17 DIAGNOSIS — J45909 Unspecified asthma, uncomplicated: Secondary | ICD-10-CM | POA: Diagnosis not present

## 2016-07-17 DIAGNOSIS — R0609 Other forms of dyspnea: Secondary | ICD-10-CM | POA: Diagnosis not present

## 2016-07-17 DIAGNOSIS — G4733 Obstructive sleep apnea (adult) (pediatric): Secondary | ICD-10-CM | POA: Diagnosis not present

## 2016-08-12 DIAGNOSIS — Z79899 Other long term (current) drug therapy: Secondary | ICD-10-CM | POA: Diagnosis not present

## 2016-08-12 DIAGNOSIS — E876 Hypokalemia: Secondary | ICD-10-CM | POA: Diagnosis not present

## 2016-08-12 DIAGNOSIS — N390 Urinary tract infection, site not specified: Secondary | ICD-10-CM | POA: Diagnosis not present

## 2016-08-12 DIAGNOSIS — R600 Localized edema: Secondary | ICD-10-CM | POA: Diagnosis not present

## 2016-08-12 DIAGNOSIS — G4733 Obstructive sleep apnea (adult) (pediatric): Secondary | ICD-10-CM | POA: Diagnosis not present

## 2016-08-12 DIAGNOSIS — D649 Anemia, unspecified: Secondary | ICD-10-CM | POA: Insufficient documentation

## 2016-08-12 DIAGNOSIS — I482 Chronic atrial fibrillation: Secondary | ICD-10-CM | POA: Diagnosis not present

## 2016-08-12 DIAGNOSIS — E782 Mixed hyperlipidemia: Secondary | ICD-10-CM | POA: Diagnosis not present

## 2016-08-12 DIAGNOSIS — I10 Essential (primary) hypertension: Secondary | ICD-10-CM | POA: Diagnosis not present

## 2016-08-12 DIAGNOSIS — Z9989 Dependence on other enabling machines and devices: Secondary | ICD-10-CM | POA: Diagnosis not present

## 2016-08-13 DIAGNOSIS — E785 Hyperlipidemia, unspecified: Secondary | ICD-10-CM | POA: Diagnosis not present

## 2016-08-13 DIAGNOSIS — I480 Paroxysmal atrial fibrillation: Secondary | ICD-10-CM | POA: Diagnosis not present

## 2016-08-13 DIAGNOSIS — J189 Pneumonia, unspecified organism: Secondary | ICD-10-CM | POA: Diagnosis not present

## 2016-08-13 DIAGNOSIS — R600 Localized edema: Secondary | ICD-10-CM | POA: Diagnosis not present

## 2016-08-26 DIAGNOSIS — I482 Chronic atrial fibrillation: Secondary | ICD-10-CM | POA: Diagnosis not present

## 2016-08-26 DIAGNOSIS — Z79899 Other long term (current) drug therapy: Secondary | ICD-10-CM | POA: Diagnosis not present

## 2016-08-26 DIAGNOSIS — I10 Essential (primary) hypertension: Secondary | ICD-10-CM | POA: Diagnosis not present

## 2016-08-26 DIAGNOSIS — D649 Anemia, unspecified: Secondary | ICD-10-CM | POA: Diagnosis not present

## 2016-08-26 DIAGNOSIS — R600 Localized edema: Secondary | ICD-10-CM | POA: Diagnosis not present

## 2016-08-26 DIAGNOSIS — R0609 Other forms of dyspnea: Secondary | ICD-10-CM | POA: Diagnosis not present

## 2016-08-27 ENCOUNTER — Encounter
Admission: RE | Disposition: A | Discharge status: Home / Self Care | Payer: Self-pay | Source: Ambulatory Visit | Attending: Unknown Physician Specialty

## 2016-08-27 ENCOUNTER — Ambulatory Visit
Admission: RE | Admit: 2016-08-27 | Discharge: 2016-08-27 | Disposition: A | Discharge status: Home / Self Care | Payer: PPO | Source: Ambulatory Visit | Attending: Unknown Physician Specialty | Admitting: Unknown Physician Specialty

## 2016-08-27 ENCOUNTER — Ambulatory Visit: Payer: PPO | Admitting: Certified Registered"

## 2016-08-27 DIAGNOSIS — K219 Gastro-esophageal reflux disease without esophagitis: Secondary | ICD-10-CM | POA: Insufficient documentation

## 2016-08-27 DIAGNOSIS — I10 Essential (primary) hypertension: Secondary | ICD-10-CM | POA: Insufficient documentation

## 2016-08-27 DIAGNOSIS — K635 Polyp of colon: Secondary | ICD-10-CM | POA: Diagnosis not present

## 2016-08-27 DIAGNOSIS — Z8711 Personal history of peptic ulcer disease: Secondary | ICD-10-CM | POA: Diagnosis not present

## 2016-08-27 DIAGNOSIS — J45909 Unspecified asthma, uncomplicated: Secondary | ICD-10-CM | POA: Diagnosis not present

## 2016-08-27 DIAGNOSIS — Z880 Allergy status to penicillin: Secondary | ICD-10-CM | POA: Diagnosis not present

## 2016-08-27 DIAGNOSIS — D123 Benign neoplasm of transverse colon: Secondary | ICD-10-CM | POA: Diagnosis not present

## 2016-08-27 DIAGNOSIS — I482 Chronic atrial fibrillation: Secondary | ICD-10-CM | POA: Diagnosis not present

## 2016-08-27 DIAGNOSIS — Z6835 Body mass index (BMI) 35.0-35.9, adult: Secondary | ICD-10-CM | POA: Diagnosis not present

## 2016-08-27 DIAGNOSIS — K640 First degree hemorrhoids: Secondary | ICD-10-CM | POA: Diagnosis not present

## 2016-08-27 DIAGNOSIS — Z803 Family history of malignant neoplasm of breast: Secondary | ICD-10-CM | POA: Insufficient documentation

## 2016-08-27 DIAGNOSIS — Z86010 Personal history of colonic polyps: Secondary | ICD-10-CM | POA: Insufficient documentation

## 2016-08-27 DIAGNOSIS — Z7951 Long term (current) use of inhaled steroids: Secondary | ICD-10-CM | POA: Diagnosis not present

## 2016-08-27 DIAGNOSIS — Z885 Allergy status to narcotic agent status: Secondary | ICD-10-CM | POA: Diagnosis not present

## 2016-08-27 DIAGNOSIS — D120 Benign neoplasm of cecum: Secondary | ICD-10-CM | POA: Insufficient documentation

## 2016-08-27 DIAGNOSIS — Z823 Family history of stroke: Secondary | ICD-10-CM | POA: Insufficient documentation

## 2016-08-27 DIAGNOSIS — D509 Iron deficiency anemia, unspecified: Secondary | ICD-10-CM | POA: Insufficient documentation

## 2016-08-27 DIAGNOSIS — D125 Benign neoplasm of sigmoid colon: Secondary | ICD-10-CM | POA: Diagnosis not present

## 2016-08-27 DIAGNOSIS — Z8349 Family history of other endocrine, nutritional and metabolic diseases: Secondary | ICD-10-CM | POA: Insufficient documentation

## 2016-08-27 DIAGNOSIS — Z79899 Other long term (current) drug therapy: Secondary | ICD-10-CM | POA: Insufficient documentation

## 2016-08-27 DIAGNOSIS — K648 Other hemorrhoids: Secondary | ICD-10-CM | POA: Diagnosis not present

## 2016-08-27 DIAGNOSIS — G4730 Sleep apnea, unspecified: Secondary | ICD-10-CM | POA: Insufficient documentation

## 2016-08-27 DIAGNOSIS — Z7901 Long term (current) use of anticoagulants: Secondary | ICD-10-CM | POA: Insufficient documentation

## 2016-08-27 DIAGNOSIS — E785 Hyperlipidemia, unspecified: Secondary | ICD-10-CM | POA: Diagnosis not present

## 2016-08-27 DIAGNOSIS — Z1211 Encounter for screening for malignant neoplasm of colon: Secondary | ICD-10-CM | POA: Diagnosis not present

## 2016-08-27 DIAGNOSIS — Z8249 Family history of ischemic heart disease and other diseases of the circulatory system: Secondary | ICD-10-CM | POA: Insufficient documentation

## 2016-08-27 DIAGNOSIS — M1990 Unspecified osteoarthritis, unspecified site: Secondary | ICD-10-CM | POA: Insufficient documentation

## 2016-08-27 DIAGNOSIS — I2510 Atherosclerotic heart disease of native coronary artery without angina pectoris: Secondary | ICD-10-CM | POA: Diagnosis not present

## 2016-08-27 HISTORY — DX: Benign neoplasm of colon, unspecified: D12.6

## 2016-08-27 HISTORY — DX: Atherosclerotic heart disease of native coronary artery without angina pectoris: I25.10

## 2016-08-27 HISTORY — DX: Localized edema: R60.0

## 2016-08-27 HISTORY — DX: Chronic atrial fibrillation, unspecified: I48.20

## 2016-08-27 HISTORY — DX: Varicella without complication: B01.9

## 2016-08-27 HISTORY — DX: Unspecified osteoarthritis, unspecified site: M19.90

## 2016-08-27 HISTORY — DX: Unspecified atrial fibrillation: I48.91

## 2016-08-27 HISTORY — DX: Age-related osteoporosis without current pathological fracture: M81.0

## 2016-08-27 HISTORY — DX: Gastro-esophageal reflux disease without esophagitis: K21.9

## 2016-08-27 HISTORY — DX: Iron deficiency anemia, unspecified: D50.9

## 2016-08-27 HISTORY — DX: Sleep apnea, unspecified: G47.30

## 2016-08-27 HISTORY — DX: Rheumatic tricuspid insufficiency: I07.1

## 2016-08-27 HISTORY — DX: Anemia, unspecified: D64.9

## 2016-08-27 HISTORY — DX: Diffuse cystic mastopathy of unspecified breast: N60.19

## 2016-08-27 HISTORY — PX: COLONOSCOPY WITH PROPOFOL: SHX5780

## 2016-08-27 HISTORY — DX: Peptic ulcer, site unspecified, unspecified as acute or chronic, without hemorrhage or perforation: K27.9

## 2016-08-27 SURGERY — COLONOSCOPY WITH PROPOFOL
Anesthesia: General

## 2016-08-27 MED ORDER — PHENYLEPHRINE HCL 10 MG/ML IJ SOLN
INTRAMUSCULAR | Status: DC | PRN
  Administered 2016-08-27 (×2): 100 ug via INTRAVENOUS

## 2016-08-27 MED ORDER — SODIUM CHLORIDE 0.9 % IV SOLN
INTRAVENOUS | Status: DC
  Administered 2016-08-27: 15:00:00 via INTRAVENOUS

## 2016-08-27 MED ORDER — PROPOFOL 500 MG/50ML IV EMUL
INTRAVENOUS | Status: DC | PRN
  Administered 2016-08-27: 150 ug/kg/min via INTRAVENOUS

## 2016-08-27 MED ORDER — LABETALOL HCL 5 MG/ML IV SOLN
INTRAVENOUS | Status: DC | PRN
  Administered 2016-08-27: 5 mg via INTRAVENOUS

## 2016-08-27 MED ORDER — EPHEDRINE SULFATE 50 MG/ML IJ SOLN
INTRAMUSCULAR | Status: DC | PRN
  Administered 2016-08-27: 10 mg via INTRAVENOUS

## 2016-08-27 MED ORDER — LIDOCAINE HCL (CARDIAC) 20 MG/ML IV SOLN
INTRAVENOUS | Status: DC | PRN
  Administered 2016-08-27: 60 mg via INTRAVENOUS

## 2016-08-27 MED ORDER — SODIUM CHLORIDE 0.9 % IV SOLN: INTRAVENOUS | Status: DC

## 2016-08-27 MED ORDER — PROPOFOL 10 MG/ML IV BOLUS
INTRAVENOUS | Status: DC | PRN
  Administered 2016-08-27: 30 mg via INTRAVENOUS

## 2016-08-27 MED ORDER — MIDAZOLAM HCL 2 MG/2ML IJ SOLN
INTRAMUSCULAR | Status: DC | PRN
  Administered 2016-08-27: 1 mg via INTRAVENOUS

## 2016-08-27 NOTE — Transfer of Care (Signed)
Immediate Anesthesia Transfer of Care Note  Patient: Alaiza Sult  Procedure(s) Performed: Procedure(s): COLONOSCOPY WITH PROPOFOL (N/A)  Patient Location: PACU  Anesthesia Type:General  Level of Consciousness: awake  Airway & Oxygen Therapy: Patient Spontanous Breathing and Patient connected to nasal cannula oxygen  Post-op Assessment: Report given to RN and Post -op Vital signs reviewed and stable  Post vital signs: Reviewed and stable  Last Vitals:  Vitals:   08/27/16 1410 08/27/16 1557  BP: (!) 158/77 124/69  Pulse: 91 75  Resp: 16 (!) 24  Temp: 36.5 C (!) 35.9 C    Last Pain:  Vitals:   08/27/16 1557  TempSrc: Tympanic         Complications: No apparent anesthesia complications

## 2016-08-27 NOTE — Anesthesia Procedure Notes (Signed)
Date/Time: 08/27/2016 3:09 PM Performed by: Johnna Acosta Pre-anesthesia Checklist: Patient identified, Emergency Drugs available, Suction available, Patient being monitored and Timeout performed Patient Re-evaluated:Patient Re-evaluated prior to inductionOxygen Delivery Method: Nasal cannula

## 2016-08-27 NOTE — Op Note (Signed)
[facility] Patient Name: Cynthia Wallace Procedure Date: 08/27/2016 2:57 PM MRN: [id number] Account #: [id number] Date of Birth: 05-19-1936 Admit Type: Outpatient Age: 80 Room: [facility] ENDO ROOM 4 Gender: Female Note Status: Finalized Procedure:            Colonoscopy Indications:          High risk colon cancer surveillance: Personal history                        of colonic polyps Providers:            Manya Silvas, MD Referring MD:         Leonie Douglas. Doy Hutching, MD (Referring MD) Medicines:            Propofol per Anesthesia Complications:        No immediate complications. Procedure:            Pre-Anesthesia Assessment:                       - After reviewing the risks and benefits, the patient                        was deemed in satisfactory condition to undergo the                        procedure.                       After obtaining informed consent, the colonoscope was                        passed under direct vision. Throughout the procedure,                        the patient's blood pressure, pulse, and oxygen                        saturations were monitored continuously. The                        Colonoscope was introduced through the anus and                        advanced to the the cecum, identified by appendiceal                        orifice and ileocecal valve. The colonoscopy was                        performed without difficulty. The patient tolerated the                        procedure well. The quality of the bowel preparation                        was good. Findings:      Two sessile polyps were found in the transverse colon. The polyps were       small in size. These polyps were removed with a hot snare. Resection and       retrieval were complete. For hemostasis, one hemostatic clip was  successfully placed. There was no bleeding at the end of the procedure.      A small polyp was found in the sigmoid colon.  The polyp was sessile. The       polyp was removed with a hot snare. Resection and retrieval were       complete.      Three sessile polyps were found in the splenic flexure, transverse colon       and cecum. The polyps were diminutive in size. These polyps were removed       with a jumbo cold forceps. Resection and retrieval were complete. One       clip applied.      Internal hemorrhoids were found during endoscopy. The hemorrhoids were       small and Grade I (internal hemorrhoids that do not prolapse). Impression:           - Two small polyps in the transverse colon, removed                        with a hot snare. Resected and retrieved. Clip was                        placed.                       - One small polyp in the sigmoid colon, removed with a                        hot snare. Resected and retrieved.                       - Three diminutive polyps at the splenic flexure, in                        the transverse colon and in the cecum, removed with a                        jumbo cold forceps. Resected and retrieved.                       - Internal hemorrhoids. Recommendation:       - Await pathology results. Manya Silvas, MD 08/27/2016 3:52:53 PM This report has been signed electronically. Number of Addenda: 0 Note Initiated On: 08/27/2016 2:57 PM Scope Withdrawal Time: 0 hours 18 minutes 51 seconds  Total Procedure Duration: 0 hours 36 minutes 34 seconds       [facility]

## 2016-08-27 NOTE — Anesthesia Postprocedure Evaluation (Signed)
Anesthesia Post Note  Patient: Cynthia Wallace  Procedure(s) Performed: Procedure(s) (LRB): COLONOSCOPY WITH PROPOFOL (N/A)  Patient location during evaluation: Endoscopy Anesthesia Type: General Level of consciousness: awake and alert Pain management: pain level controlled Vital Signs Assessment: post-procedure vital signs reviewed and stable Respiratory status: spontaneous breathing, nonlabored ventilation, respiratory function stable and patient connected to nasal cannula oxygen Cardiovascular status: blood pressure returned to baseline and stable Postop Assessment: no signs of nausea or vomiting Anesthetic complications: no    Last Vitals:  Vitals:   08/27/16 1607 08/27/16 1617  BP: 133/69 (!) 141/79  Pulse: 73 70  Resp: 20 19  Temp:      Last Pain:  Vitals:   08/27/16 1557  TempSrc: Tympanic                 Martha Clan

## 2016-08-27 NOTE — Anesthesia Preprocedure Evaluation (Signed)
Anesthesia Evaluation  Patient identified by MRN, date of birth, ID band Patient awake    Reviewed: Allergy & Precautions, NPO status , Patient's Chart, lab work & pertinent test results  Airway Mallampati: III       Dental  (+) Teeth Intact   Pulmonary asthma , sleep apnea ,     + decreased breath sounds      Cardiovascular hypertension, Pt. on medications + CAD  + pacemaker  Rhythm:Regular     Neuro/Psych    GI/Hepatic Neg liver ROS, PUD, GERD  Medicated,  Endo/Other  Morbid obesity  Renal/GU negative Renal ROS     Musculoskeletal   Abdominal (+) + obese,   Peds  Hematology  (+) anemia ,   Anesthesia Other Findings   Reproductive/Obstetrics                             Anesthesia Physical Anesthesia Plan  ASA: III  Anesthesia Plan: General   Post-op Pain Management:    Induction: Intravenous  Airway Management Planned: Natural Airway and Nasal Cannula  Additional Equipment:   Intra-op Plan:   Post-operative Plan:   Informed Consent: I have reviewed the patients History and Physical, chart, labs and discussed the procedure including the risks, benefits and alternatives for the proposed anesthesia with the patient or authorized representative who has indicated his/her understanding and acceptance.     Plan Discussed with: CRNA  Anesthesia Plan Comments:         Anesthesia Quick Evaluation

## 2016-08-28 ENCOUNTER — Encounter: Payer: Self-pay | Admitting: Unknown Physician Specialty

## 2016-08-28 NOTE — H&P (Signed)
Primary Care Physician:  Idelle Crouch, MD Primary Gastroenterologist:  Dr. Vira Agar  Pre-Procedure History & Physical: HPI:  Cynthia Wallace is a 80 y.o. female is here for an colonoscopy for [facility] of colon polyps.   Past Medical History:  Diagnosis Date  . Acid reflux `  . Adenomatous colon polyp   . Anemia   . Arthritis   . Asthma   . Atrial fibrillation ([place])   . Bilateral leg edema   . Chickenpox   . Chronic a-fib ([place])   . Coronary artery disease   . Fibrocystic breast disease   . Hyperlipidemia   . Hypertension   . Iron deficiency anemia   . Osteoporosis   . PUD (peptic ulcer disease)   . Sleep apnea   . Tricuspid valve insufficiency     Past Surgical History:  Procedure Laterality Date  . ABDOMINAL HYSTERECTOMY    . BILATERAL KNEE ARTHROSCOPY    . BREAST EXCISIONAL BIOPSY Right 1990   NEG  . CARDIAC CATHETERIZATION    . COLONOSCOPY WITH PROPOFOL N/A 08/27/2016   Procedure: COLONOSCOPY WITH PROPOFOL;  Surgeon: Manya Silvas, MD;  Location: [facility] ENDOSCOPY;  Service: Endoscopy;  Laterality: N/A;  . INSERT / REPLACE / REMOVE PACEMAKER    . JOINT REPLACEMENT    . PACEMAKER INSERTION    . TUBAL LIGATION      Prior to Admission medications   Medication Sig Start Date End Date Taking? Authorizing Provider  albuterol (PROVENTIL) (2.5 MG/3ML) 0.083% nebulizer solution Take 2.5 mg by nebulization every 6 (six) hours as needed for wheezing or shortness of breath.   Yes Historical Provider, MD  Albuterol Sulfate 108 (90 Base) MCG/ACT AEPB Inhale into the lungs. As needed for sob   Yes Historical Provider, MD  atorvastatin (LIPITOR) 10 MG tablet Take 10 mg by mouth.   Yes Historical Provider, MD  B Complex-C (SUPER B COMPLEX PO) Take by mouth.   Yes Historical Provider, MD  budesonide-formoterol (SYMBICORT) 160-4.5 MCG/ACT inhaler Inhale 2 puffs into the lungs 2 (two) times daily.   Yes Historical Provider, MD  Cholecalciferol 10000 units CAPS Take by mouth.   Yes  Historical Provider, MD  digoxin (LANOXIN) 0.125 MG tablet Take by mouth daily.   Yes Historical Provider, MD  furosemide (LASIX) 20 MG tablet Take 20 mg by mouth. Pt to take 2 tablets once a day   Yes Historical Provider, MD  gabapentin (NEURONTIN) 100 MG capsule Take 100 mg by mouth 3 (three) times daily.   Yes Historical Provider, MD  HYDROcodone-acetaminophen (NORCO/VICODIN) 5-325 MG tablet Take 1 tablet by mouth every 6 (six) hours as needed. Reported on 04/22/2016   Yes Historical Provider, MD  losartan (COZAAR) 50 MG tablet Take 50 mg by mouth daily.   Yes Historical Provider, MD  Melatonin 10 MG TABS Take by mouth. Takes at night   Yes Historical Provider, MD  metoprolol succinate (TOPROL-XL) 50 MG 24 hr tablet Take 50 mg by mouth 2 (two) times daily.   Yes Historical Provider, MD  montelukast (SINGULAIR) 10 MG tablet Take 10 mg by mouth at bedtime.   Yes Historical Provider, MD  NONFORMULARY OR COMPOUNDED ITEM Place 5 mg vaginally as needed (for spasms). Reported on 04/22/2016   Yes Historical Provider, MD  potassium chloride (K-DUR) 10 MEQ tablet Take 10 mEq by mouth daily.   Yes Historical Provider, MD  predniSONE (DELTASONE) 10 MG tablet Take 10 mg by mouth daily with breakfast.   Yes Historical  Provider, MD  rivaroxaban (XARELTO) 20 MG TABS tablet Take 20 mg by mouth daily with supper.   Yes Historical Provider, MD  traZODone (DESYREL) 50 MG tablet Take 50 mg by mouth at bedtime.   Yes Historical Provider, MD    Allergies as of 06/04/2016 - Review Complete 04/23/2016  Allergen Reaction Noted  . Codeine Nausea Only 04/22/2016  . Penicillin g Other (See Comments) 04/22/2016    Family History  Problem Relation Age of Onset  . Hyperlipidemia Mother   . Hypertension Mother   . Heart disease Mother   . Heart disease Father   . Cancer Sister   . Breast cancer Sister   . Stroke Sister   . Hyperlipidemia Sister   . Heart disease Sister     Social History   Social History  .  Marital status: Widowed    Spouse name: N/A  . Number of children: N/A  . Years of education: N/A   Occupational History  . Not on file.   Social History Main Topics  . Smoking status: Never Smoker  . Smokeless tobacco: Never Used  . Alcohol use No  . Drug use: No  . Sexual activity: Not on file   Other Topics Concern  . Not on file   Social History Narrative  . No narrative on file    Review of Systems: See HPI, otherwise negative ROS  Physical Exam: BP (!) 141/79   Pulse 70   Temp (!) 96.6 F (35.9 C) (Tympanic)   Resp 19   Ht 5\' 6"  (1.676 m)   Wt 100.2 kg (221 lb)   SpO2 97%   BMI 35.67 kg/m  General:   Alert,  pleasant and cooperative in NAD Head:  Normocephalic and atraumatic. Neck:  Supple; no masses or thyromegaly. Lungs:  Clear throughout to auscultation.    Heart:  Regular rate and rhythm. Abdomen:  Soft, nontender and nondistended. Normal bowel sounds, without guarding, and without rebound.   Neurologic:  Alert and  oriented x4;  grossly normal neurologically.  Impression/Plan: [name] is here for an colonoscopy to be performed for [facility] colon polyps  Risks, benefits, limitations, and alternatives regarding  colonoscopy have been reviewed with the patient.  Questions have been answered.  All parties agreeable.   Gaylyn Cheers, MD  08/28/2016, 12:17 PM

## 2016-08-29 LAB — SURGICAL PATHOLOGY

## 2016-09-12 DIAGNOSIS — E785 Hyperlipidemia, unspecified: Secondary | ICD-10-CM | POA: Diagnosis not present

## 2016-09-12 DIAGNOSIS — R600 Localized edema: Secondary | ICD-10-CM | POA: Diagnosis not present

## 2016-09-12 DIAGNOSIS — I480 Paroxysmal atrial fibrillation: Secondary | ICD-10-CM | POA: Diagnosis not present

## 2016-09-12 DIAGNOSIS — J189 Pneumonia, unspecified organism: Secondary | ICD-10-CM | POA: Diagnosis not present

## 2016-09-14 DIAGNOSIS — D696 Thrombocytopenia, unspecified: Secondary | ICD-10-CM | POA: Insufficient documentation

## 2016-09-14 NOTE — Progress Notes (Addendum)
[place]  Telephone:([phone]) [phone] Fax:(336) [phone]  ID: Cynthia Wallace OB: [birth year]  MR#: [id number]  XIP#:[id number]  Patient Care Team: Idelle Crouch, MD as PCP - General (Internal Medicine)  CHIEF COMPLAINT: Thrombocytopenia, MGUS.  INTERVAL HISTORY: Patient is a 80 year old female whose last evaluated in clinic greater than 5 years ago. She is referred back for further evaluation of her persistent thrombocytopenia. She currently feels well and is asymptomatic. She has no neurologic complaints. She denies any recent fevers or illnesses. She has a good appetite and denies weight loss. She has no chest pain or shortness of breath. She denies any nausea, vomiting, constipation, or diarrhea. She has no urinary complaint. Patient feels at her baseline and offers no specific complaints today.   REVIEW OF SYSTEMS:   Review of Systems  Constitutional: Negative.  Negative for fever, malaise/fatigue and weight loss.  Respiratory: Negative.  Negative for cough and shortness of breath.   Cardiovascular: Negative.  Negative for chest pain and leg swelling.  Gastrointestinal: Negative.  Negative for abdominal pain.  Genitourinary: Negative.   Musculoskeletal: Negative.   Neurological: Negative for weakness.  Psychiatric/Behavioral: Negative.  The patient is not nervous/anxious.     As per HPI. Otherwise, a complete review of systems is negative.  PAST MEDICAL HISTORY: Past Medical History:  Diagnosis Date  . Acid reflux `  . Adenomatous colon polyp   . Anemia   . Arthritis   . Asthma   . Atrial fibrillation ([place])   . Bilateral leg edema   . Chickenpox   . Chronic a-fib ([place])   . Coronary artery disease   . Fibrocystic breast disease   . Hyperlipidemia   . Hypertension   . Iron deficiency anemia   . Osteoporosis   . PUD (peptic ulcer disease)   . Sleep apnea   . Tricuspid valve insufficiency     PAST SURGICAL HISTORY: Past Surgical History:    Procedure Laterality Date  . ABDOMINAL HYSTERECTOMY    . BILATERAL KNEE ARTHROSCOPY    . BREAST EXCISIONAL BIOPSY Right 1990   NEG  . CARDIAC CATHETERIZATION    . COLONOSCOPY WITH PROPOFOL N/A 08/27/2016   Procedure: COLONOSCOPY WITH PROPOFOL;  Surgeon: Manya Silvas, MD;  Location: [facility] ENDOSCOPY;  Service: Endoscopy;  Laterality: N/A;  . INSERT / REPLACE / REMOVE PACEMAKER    . JOINT REPLACEMENT    . PACEMAKER INSERTION    . TUBAL LIGATION      FAMILY HISTORY: Family History  Problem Relation Age of Onset  . Hyperlipidemia Mother   . Hypertension Mother   . Heart disease Mother   . Heart disease Father   . Cancer Sister   . Breast cancer Sister   . Stroke Sister   . Hyperlipidemia Sister   . Heart disease Sister     ADVANCED DIRECTIVES (Y/N):  N  HEALTH MAINTENANCE: Social History  Substance Use Topics  . Smoking status: Never Smoker  . Smokeless tobacco: Never Used  . Alcohol use No     Colonoscopy:  PAP:  Bone density:  Lipid panel:  Allergies  Allergen Reactions  . Codeine Nausea Only  . Penicillin G Other (See Comments)    As a child    Current Outpatient Prescriptions  Medication Sig Dispense Refill  . albuterol (PROVENTIL) (2.5 MG/3ML) 0.083% nebulizer solution Take 2.5 mg by nebulization every 6 (six) hours as needed for wheezing or shortness of breath.    . Albuterol Sulfate 108 (90 Base)  MCG/ACT AEPB Inhale 1-2 puffs into the lungs every 6 (six) hours as needed. As needed for sob     . atorvastatin (LIPITOR) 10 MG tablet Take 10 mg by mouth.    . budesonide-formoterol (SYMBICORT) 160-4.5 MCG/ACT inhaler Inhale 2 puffs into the lungs 2 (two) times daily.    . digoxin (LANOXIN) 0.125 MG tablet Take 0.125 mg by mouth daily.     . furosemide (LASIX) 20 MG tablet Take 40 mg by mouth daily. Pt to take 2 tablets once a day     . gabapentin (NEURONTIN) 100 MG capsule Take 100 mg by mouth 3 (three) times daily.    Marland Kitchen losartan (COZAAR) 50 MG tablet Take  50 mg by mouth daily.    . Melatonin 10 MG TABS Take 1 tablet by mouth at bedtime as needed. Takes at night     . metoprolol succinate (TOPROL-XL) 50 MG 24 hr tablet Take 50 mg by mouth 2 (two) times daily.    . montelukast (SINGULAIR) 10 MG tablet Take 10 mg by mouth at bedtime.    . NONFORMULARY OR COMPOUNDED ITEM Place 5 mg vaginally as needed (for spasms). Reported on 04/22/2016    . potassium chloride (K-DUR) 10 MEQ tablet Take 10 mEq by mouth 3 (three) times daily.     . rivaroxaban (XARELTO) 20 MG TABS tablet Take 20 mg by mouth daily with supper.    . traZODone (DESYREL) 50 MG tablet Take 50 mg by mouth at bedtime.     No current facility-administered medications for this visit.     OBJECTIVE: Vitals:   09/15/16 1116  BP: 128/79  Pulse: 88  Resp: 18  Temp: (!) 95 F (35 C)     Body mass index is 35.46 kg/m.    ECOG FS:0 - Asymptomatic  General: Well-developed, well-nourished, no acute distress. Eyes: Pink conjunctiva, anicteric sclera. HEENT: Normocephalic, moist mucous membranes, clear oropharnyx. Lungs: Clear to auscultation bilaterally. Heart: Regular rate and rhythm. No rubs, murmurs, or gallops. Abdomen: Soft, nontender, nondistended. No organomegaly noted, normoactive bowel sounds. Musculoskeletal: No edema, cyanosis, or clubbing. Neuro: Alert, answering all questions appropriately. Cranial nerves grossly intact. Skin: No rashes or petechiae noted. Psych: Normal affect. Lymphatics: No cervical, calvicular, axillary or inguinal LAD.   LAB RESULTS:  Lab Results  Component Value Date   NA 139 03/05/2015   K 4.1 03/05/2015   CL 106 03/05/2015   CO2 28 03/05/2015   GLUCOSE 92 03/05/2015   BUN 23 (H) 03/05/2015   CREATININE 0.75 03/05/2015   CALCIUM 8.9 03/05/2015   GFRNONAA >60 03/05/2015   GFRAA >60 03/05/2015    Lab Results  Component Value Date   WBC 5.0 09/15/2016   NEUTROABS 2.9 09/15/2016   HGB 11.3 (L) 09/15/2016   HCT 33.8 (L) 09/15/2016   MCV  92.7 09/15/2016   PLT 133 (L) 09/15/2016   Lab Results  Component Value Date   TOTALPROTELP 7.6 09/15/2016   ALBUMINELP 3.6 09/15/2016   A1GS 0.3 09/15/2016   A2GS 0.8 09/15/2016   BETS 1.1 09/15/2016   GAMS 1.9 (H) 09/15/2016   MSPIKE 1.2 (H) 09/15/2016   SPEI Comment 09/15/2016     STUDIES: No results found.  ASSESSMENT: Thrombocytopenia, MGUS.  PLAN:    1. MGUS: Patient noted to have a mildly elevated M spike of 1.2. She has a mild thrombocytopenia, but no other evidence of endorgan damage. She does not require a metastatic bone survey or bone marrow biopsy at this time,  but would consider one in the future if there is concern for progressive disease. Return to clinic in 3 months with repeat laboratory work and further evaluation. At which time if her M spike remain stable, she likely can be followed every 6 months. 2. Thrombocytopenia: Mild. The remainder of her laboratory work other than her elevated M spike is either negative or within normal limits. No intervention is needed at this time. Return to clinic as above.   Patient expressed understanding and was in agreement with this plan. She also understands that She can call clinic at any time with any questions, concerns, or complaints.    Lloyd Huger, MD   09/17/2016 1:07 PM   Addendum: Patient had a bone survey at an outside facility on February 12, 2017 that did not reveal any evidence of lytic lesions. She had a bone marrow biopsy on February 23, 2017 which flow cytometry revealed 2% monoclonal plasma cells. Immunohistochemical stains revealed scattered focally small clusters of plasma cell approximate 6-13% of total bone marrow cells.

## 2016-09-15 ENCOUNTER — Inpatient Hospital Stay: Payer: PPO | Attending: Oncology | Admitting: Oncology

## 2016-09-15 ENCOUNTER — Inpatient Hospital Stay: Payer: PPO

## 2016-09-15 ENCOUNTER — Encounter: Payer: Self-pay | Admitting: Oncology

## 2016-09-15 VITALS — BP 128/79 | HR 88 | Temp 95.0°F | Resp 18 | Wt 219.7 lb

## 2016-09-15 DIAGNOSIS — D509 Iron deficiency anemia, unspecified: Secondary | ICD-10-CM

## 2016-09-15 DIAGNOSIS — I10 Essential (primary) hypertension: Secondary | ICD-10-CM | POA: Insufficient documentation

## 2016-09-15 DIAGNOSIS — E785 Hyperlipidemia, unspecified: Secondary | ICD-10-CM | POA: Insufficient documentation

## 2016-09-15 DIAGNOSIS — I071 Rheumatic tricuspid insufficiency: Secondary | ICD-10-CM | POA: Diagnosis not present

## 2016-09-15 DIAGNOSIS — M1990 Unspecified osteoarthritis, unspecified site: Secondary | ICD-10-CM | POA: Diagnosis not present

## 2016-09-15 DIAGNOSIS — Z7901 Long term (current) use of anticoagulants: Secondary | ICD-10-CM

## 2016-09-15 DIAGNOSIS — D472 Monoclonal gammopathy: Secondary | ICD-10-CM

## 2016-09-15 DIAGNOSIS — Z803 Family history of malignant neoplasm of breast: Secondary | ICD-10-CM | POA: Insufficient documentation

## 2016-09-15 DIAGNOSIS — Z79899 Other long term (current) drug therapy: Secondary | ICD-10-CM | POA: Insufficient documentation

## 2016-09-15 DIAGNOSIS — M810 Age-related osteoporosis without current pathological fracture: Secondary | ICD-10-CM

## 2016-09-15 DIAGNOSIS — I482 Chronic atrial fibrillation: Secondary | ICD-10-CM | POA: Insufficient documentation

## 2016-09-15 DIAGNOSIS — I2510 Atherosclerotic heart disease of native coronary artery without angina pectoris: Secondary | ICD-10-CM

## 2016-09-15 DIAGNOSIS — D696 Thrombocytopenia, unspecified: Secondary | ICD-10-CM | POA: Diagnosis not present

## 2016-09-15 LAB — CBC WITH DIFFERENTIAL/PLATELET
Basophils Absolute: 0 10*3/uL (ref 0–0.1)
Basophils Relative: 1 %
Eosinophils Absolute: 0.1 10*3/uL (ref 0–0.7)
Eosinophils Relative: 2 %
HCT: 33.8 % — ABNORMAL LOW (ref 35.0–47.0)
Hemoglobin: 11.3 g/dL — ABNORMAL LOW (ref 12.0–16.0)
Lymphocytes Relative: 27 %
Lymphs Abs: 1.3 10*3/uL (ref 1.0–3.6)
MCH: 30.9 pg (ref 26.0–34.0)
MCHC: 33.4 g/dL (ref 32.0–36.0)
MCV: 92.7 fL (ref 80.0–100.0)
Monocytes Absolute: 0.6 10*3/uL (ref 0.2–0.9)
Monocytes Relative: 12 %
Neutro Abs: 2.9 10*3/uL (ref 1.4–6.5)
Neutrophils Relative %: 58 %
Platelets: 133 10*3/uL — ABNORMAL LOW (ref 150–440)
RBC: 3.65 MIL/uL — ABNORMAL LOW (ref 3.80–5.20)
RDW: 14.9 % — ABNORMAL HIGH (ref 11.5–14.5)
WBC: 5 10*3/uL (ref 3.6–11.0)

## 2016-09-15 LAB — FOLATE: Folate: 44 ng/mL (ref >5.9)

## 2016-09-15 LAB — FERRITIN: Ferritin: 177 ng/mL (ref 11–307)

## 2016-09-15 LAB — LACTATE DEHYDROGENASE: LDH: 151 U/L (ref 98–192)

## 2016-09-15 LAB — VITAMIN B12: Vitamin B-12: 574 pg/mL (ref 180–914)

## 2016-09-15 NOTE — Progress Notes (Signed)
New evaluation for thrombocytopenia. States is feeling well. Offers no complaints.

## 2016-09-16 LAB — PLATELET ANTIBODY PROFILE, SERUM
HLA Ab Ser Ql EIA: NEGATIVE
IA/IIA Antibody: NEGATIVE
IB/IX Antibody: NEGATIVE
IIB/IIIA Antibody: NEGATIVE

## 2016-09-16 LAB — PROTEIN ELECTROPHORESIS, SERUM
A/G Ratio: 0.9 (ref 0.7–1.7)
Albumin ELP: 3.6 g/dL (ref 2.9–4.4)
Alpha-1-Globulin: 0.3 g/dL (ref 0.0–0.4)
Alpha-2-Globulin: 0.8 g/dL (ref 0.4–1.0)
Beta Globulin: 1.1 g/dL (ref 0.7–1.3)
Gamma Globulin: 1.9 g/dL — ABNORMAL HIGH (ref 0.4–1.8)
Globulin, Total: 4 g/dL — ABNORMAL HIGH (ref 2.2–3.9)
M-Spike, %: 1.2 g/dL — ABNORMAL HIGH
Total Protein ELP: 7.6 g/dL (ref 6.0–8.5)

## 2016-09-16 LAB — HAPTOGLOBIN: Haptoglobin: 141 mg/dL (ref 34–200)

## 2016-09-17 DIAGNOSIS — D472 Monoclonal gammopathy: Secondary | ICD-10-CM | POA: Insufficient documentation

## 2016-09-19 ENCOUNTER — Other Ambulatory Visit: Payer: Self-pay | Admitting: Ophthalmology

## 2016-09-19 DIAGNOSIS — H4710 Unspecified papilledema: Secondary | ICD-10-CM | POA: Diagnosis not present

## 2016-09-25 ENCOUNTER — Other Ambulatory Visit: Payer: Self-pay | Admitting: Ophthalmology

## 2016-09-25 ENCOUNTER — Ambulatory Visit
Admission: RE | Admit: 2016-09-25 | Discharge: 2016-09-25 | Disposition: A | Discharge status: Home / Self Care | Payer: PPO | Source: Ambulatory Visit | Attending: Ophthalmology | Admitting: Ophthalmology

## 2016-09-25 DIAGNOSIS — Z9889 Other specified postprocedural states: Secondary | ICD-10-CM | POA: Diagnosis not present

## 2016-09-25 DIAGNOSIS — M25551 Pain in right hip: Secondary | ICD-10-CM | POA: Diagnosis not present

## 2016-09-25 DIAGNOSIS — M79651 Pain in right thigh: Secondary | ICD-10-CM | POA: Diagnosis not present

## 2016-09-25 DIAGNOSIS — H4710 Unspecified papilledema: Secondary | ICD-10-CM

## 2016-09-25 DIAGNOSIS — R930 Abnormal findings on diagnostic imaging of skull and head, not elsewhere classified: Secondary | ICD-10-CM | POA: Diagnosis not present

## 2016-09-25 MED ORDER — IOPAMIDOL (ISOVUE-300) INJECTION 61%
75.0000 mL | Freq: Once | INTRAVENOUS | Status: AC | PRN
  Administered 2016-09-25: 75 mL via INTRAVENOUS

## 2016-09-29 DIAGNOSIS — H35372 Puckering of macula, left eye: Secondary | ICD-10-CM | POA: Diagnosis not present

## 2016-10-08 DIAGNOSIS — I272 Pulmonary hypertension, unspecified: Secondary | ICD-10-CM | POA: Diagnosis not present

## 2016-10-08 DIAGNOSIS — D538 Other specified nutritional anemias: Secondary | ICD-10-CM | POA: Diagnosis not present

## 2016-10-08 DIAGNOSIS — G4733 Obstructive sleep apnea (adult) (pediatric): Secondary | ICD-10-CM | POA: Diagnosis not present

## 2016-10-08 DIAGNOSIS — I10 Essential (primary) hypertension: Secondary | ICD-10-CM | POA: Diagnosis not present

## 2016-10-08 DIAGNOSIS — Z9989 Dependence on other enabling machines and devices: Secondary | ICD-10-CM | POA: Diagnosis not present

## 2016-10-08 DIAGNOSIS — I071 Rheumatic tricuspid insufficiency: Secondary | ICD-10-CM | POA: Diagnosis not present

## 2016-10-13 DIAGNOSIS — R600 Localized edema: Secondary | ICD-10-CM | POA: Diagnosis not present

## 2016-10-13 DIAGNOSIS — I480 Paroxysmal atrial fibrillation: Secondary | ICD-10-CM | POA: Diagnosis not present

## 2016-10-13 DIAGNOSIS — J189 Pneumonia, unspecified organism: Secondary | ICD-10-CM | POA: Diagnosis not present

## 2016-10-13 DIAGNOSIS — E785 Hyperlipidemia, unspecified: Secondary | ICD-10-CM | POA: Diagnosis not present

## 2016-11-12 DIAGNOSIS — J189 Pneumonia, unspecified organism: Secondary | ICD-10-CM | POA: Diagnosis not present

## 2016-11-12 DIAGNOSIS — I480 Paroxysmal atrial fibrillation: Secondary | ICD-10-CM | POA: Diagnosis not present

## 2016-11-12 DIAGNOSIS — E785 Hyperlipidemia, unspecified: Secondary | ICD-10-CM | POA: Diagnosis not present

## 2016-11-12 DIAGNOSIS — R600 Localized edema: Secondary | ICD-10-CM | POA: Diagnosis not present

## 2016-12-13 DIAGNOSIS — R600 Localized edema: Secondary | ICD-10-CM | POA: Diagnosis not present

## 2016-12-13 DIAGNOSIS — I480 Paroxysmal atrial fibrillation: Secondary | ICD-10-CM | POA: Diagnosis not present

## 2016-12-13 DIAGNOSIS — E785 Hyperlipidemia, unspecified: Secondary | ICD-10-CM | POA: Diagnosis not present

## 2016-12-13 DIAGNOSIS — J189 Pneumonia, unspecified organism: Secondary | ICD-10-CM | POA: Diagnosis not present

## 2016-12-16 DIAGNOSIS — I495 Sick sinus syndrome: Secondary | ICD-10-CM | POA: Diagnosis not present

## 2016-12-17 ENCOUNTER — Other Ambulatory Visit: Payer: PPO

## 2016-12-17 ENCOUNTER — Ambulatory Visit: Payer: PPO | Admitting: Oncology

## 2016-12-23 ENCOUNTER — Encounter: Payer: Self-pay | Admitting: Oncology

## 2016-12-23 DIAGNOSIS — D472 Monoclonal gammopathy: Secondary | ICD-10-CM | POA: Diagnosis not present

## 2017-01-05 ENCOUNTER — Telehealth: Payer: Self-pay | Admitting: *Deleted

## 2017-01-05 NOTE — Telephone Encounter (Signed)
-----   Message from Cephus Richer sent at 01/05/2017  2:19 PM EST ----- Contact: [phone] [place] is in [place] and really want to know do she have to come see MD in feb. [name]  the daughter would like for Hildred Alamin to call her back. ----- Message ----- From: Wallene Dales Sent: 01/05/2017   2:06 PM To: Lloyd Huger, MD, Cephus Richer, #  I just received a inbasket from [place] telling me to put her on the schedule. I left her a message to call me, but since you have my phone you got it.    ----- Message ----- From: Cephus Richer Sent: 01/05/2017   1:57 PM To: Lloyd Huger, MD, Wallene Dales, #  Pt would like to know why she needs to come see md . She has blood work in [place] and wants to know if we receive any lab results.

## 2017-01-05 NOTE — Telephone Encounter (Signed)
Labs received from [place]. Pt will need to follow up with hematologist in [place] just for routine follow up for MGUS. Pt's daughter informed and once pt gets appt in [place] with hematologist, pt's daughter will notify us so we can fax records.

## 2017-01-13 DIAGNOSIS — R600 Localized edema: Secondary | ICD-10-CM | POA: Diagnosis not present

## 2017-01-13 DIAGNOSIS — E785 Hyperlipidemia, unspecified: Secondary | ICD-10-CM | POA: Diagnosis not present

## 2017-01-13 DIAGNOSIS — J189 Pneumonia, unspecified organism: Secondary | ICD-10-CM | POA: Diagnosis not present

## 2017-01-13 DIAGNOSIS — I480 Paroxysmal atrial fibrillation: Secondary | ICD-10-CM | POA: Diagnosis not present

## 2017-01-20 ENCOUNTER — Ambulatory Visit: Payer: PPO | Admitting: Oncology

## 2017-02-09 DIAGNOSIS — R030 Elevated blood-pressure reading, without diagnosis of hypertension: Secondary | ICD-10-CM | POA: Diagnosis not present

## 2017-02-09 DIAGNOSIS — L03115 Cellulitis of right lower limb: Secondary | ICD-10-CM | POA: Diagnosis not present

## 2017-02-09 DIAGNOSIS — L309 Dermatitis, unspecified: Secondary | ICD-10-CM | POA: Diagnosis not present

## 2017-02-10 DIAGNOSIS — J189 Pneumonia, unspecified organism: Secondary | ICD-10-CM | POA: Diagnosis not present

## 2017-02-10 DIAGNOSIS — R600 Localized edema: Secondary | ICD-10-CM | POA: Diagnosis not present

## 2017-02-10 DIAGNOSIS — I480 Paroxysmal atrial fibrillation: Secondary | ICD-10-CM | POA: Diagnosis not present

## 2017-02-10 DIAGNOSIS — E785 Hyperlipidemia, unspecified: Secondary | ICD-10-CM | POA: Diagnosis not present

## 2017-02-12 ENCOUNTER — Other Ambulatory Visit: Payer: Self-pay | Admitting: Hematology & Oncology

## 2017-02-12 ENCOUNTER — Ambulatory Visit
Admission: RE | Admit: 2017-02-12 | Discharge: 2017-02-12 | Disposition: A | Discharge status: Home / Self Care | Payer: Medicare (Managed Care) | Source: Ambulatory Visit | Attending: Hematology & Oncology | Admitting: Hematology & Oncology

## 2017-02-12 DIAGNOSIS — D472 Monoclonal gammopathy: Secondary | ICD-10-CM

## 2017-02-12 DIAGNOSIS — C4491 Basal cell carcinoma of skin, unspecified: Secondary | ICD-10-CM | POA: Diagnosis not present

## 2017-02-12 DIAGNOSIS — M47814 Spondylosis without myelopathy or radiculopathy, thoracic region: Secondary | ICD-10-CM | POA: Diagnosis not present

## 2017-02-12 DIAGNOSIS — M47816 Spondylosis without myelopathy or radiculopathy, lumbar region: Secondary | ICD-10-CM | POA: Diagnosis not present

## 2017-02-12 DIAGNOSIS — M47812 Spondylosis without myelopathy or radiculopathy, cervical region: Secondary | ICD-10-CM | POA: Diagnosis not present

## 2017-02-13 ENCOUNTER — Other Ambulatory Visit: Payer: Self-pay | Admitting: Hematology & Oncology

## 2017-02-13 DIAGNOSIS — C4491 Basal cell carcinoma of skin, unspecified: Secondary | ICD-10-CM

## 2017-02-18 ENCOUNTER — Ambulatory Visit
Admission: RE | Admit: 2017-02-18 | Discharge: 2017-02-18 | Disposition: A | Discharge status: Home / Self Care | Payer: Medicare (Managed Care) | Source: Ambulatory Visit | Attending: Hematology & Oncology | Admitting: Hematology & Oncology

## 2017-02-18 DIAGNOSIS — D472 Monoclonal gammopathy: Secondary | ICD-10-CM | POA: Diagnosis present

## 2017-02-18 DIAGNOSIS — C9030 Solitary plasmacytoma not having achieved remission: Secondary | ICD-10-CM | POA: Insufficient documentation

## 2017-02-18 DIAGNOSIS — C4491 Basal cell carcinoma of skin, unspecified: Secondary | ICD-10-CM | POA: Insufficient documentation

## 2017-02-18 HISTORY — DX: Polyneuropathy, unspecified: G62.9

## 2017-02-18 LAB — CBC AND DIFFERENTIAL
Absolute NRBC: 0 10*3/uL
Basophils Absolute Automated: 0.03 10*3/uL (ref 0.00–0.20)
Basophils Automated: 0.6 %
Eosinophils Absolute Automated: 0.09 10*3/uL (ref 0.00–0.70)
Eosinophils Automated: 1.7 %
Hematocrit: 30 % — ABNORMAL LOW (ref 37.0–47.0)
Hgb: 9.4 g/dL — ABNORMAL LOW (ref 12.0–16.0)
Immature Granulocytes Absolute: 0.02 10*3/uL
Immature Granulocytes: 0.4 %
Lymphocytes Absolute Automated: 1.19 10*3/uL (ref 0.50–4.40)
Lymphocytes Automated: 22 %
MCH: 31.2 pg (ref 28.0–32.0)
MCHC: 31.3 g/dL — ABNORMAL LOW (ref 32.0–36.0)
MCV: 99.7 fL (ref 80.0–100.0)
MPV: 11.1 fL (ref 9.4–12.3)
Monocytes Absolute Automated: 0.5 10*3/uL (ref 0.00–1.20)
Monocytes: 9.2 %
Neutrophils Absolute: 3.58 10*3/uL (ref 1.80–8.10)
Neutrophils: 66.1 %
Nucleated RBC: 0 /100 WBC (ref 0.0–1.0)
Platelets: 121 10*3/uL — ABNORMAL LOW (ref 140–400)
RBC: 3.01 10*6/uL — ABNORMAL LOW (ref 4.20–5.40)
RDW: 14 % (ref 12–15)
WBC: 5.41 10*3/uL (ref 3.50–10.80)

## 2017-02-18 MED ORDER — FENTANYL CITRATE (PF) 50 MCG/ML IJ SOLN (WRAP)
INTRAMUSCULAR | Status: AC
Start: 2017-02-18 — End: 2017-02-18
  Administered 2017-02-18: 12:00:00 50 ug via INTRAVENOUS
  Filled 2017-02-18: qty 4

## 2017-02-18 MED ORDER — FLUMAZENIL 1 MG/10ML IV SOLN
INTRAVENOUS | Status: DC
Start: 2017-02-18 — End: 2017-02-18
  Filled 2017-02-18: qty 10

## 2017-02-18 MED ORDER — HEPARIN SODIUM (PORCINE) 1000 UNIT/ML IJ SOLN
INTRAMUSCULAR | Status: AC
Start: 2017-02-18 — End: 2017-02-18
  Administered 2017-02-18: 12:00:00 2000 [IU]
  Filled 2017-02-18: qty 2

## 2017-02-18 MED ORDER — NALOXONE HCL 0.4 MG/ML IJ SOLN (WRAP)
INTRAMUSCULAR | Status: DC
Start: 2017-02-18 — End: 2017-02-18
  Filled 2017-02-18: qty 1

## 2017-02-18 MED ORDER — MIDAZOLAM HCL 2 MG/2ML IJ SOLN
INTRAMUSCULAR | Status: AC
Start: 2017-02-18 — End: 2017-02-18
  Administered 2017-02-18: 12:00:00 2 mg via INTRAVENOUS
  Filled 2017-02-18: qty 4

## 2017-02-18 NOTE — Sedation Documentation (Signed)
Procedure ended with needle out.  Pt tolerted well

## 2017-02-18 NOTE — Discharge Instr - AVS First Page (Signed)
Reason for your Hospital Admission:  Monoclonal gammopathy      Instructions for after your discharge:        Interventional Radiology  Discharge Instructions following Biopsy    You have had a bone marrow biopsy performed by Dr. Jomarie Longs on 02/18/2017.    A biopsy is a small sample of tissue or fluid taken from the body.   Image-guided biopsy allows an Interventional Radiologist take a sample from an organ or mass without surgery.  This sample can then be studied in a laboratory.     General Instructions:  1. Rest today post procedure.  2. Avoid aspirin and aspirin-like products (NSAIDS - Motrin, Ibuprofen, Advil, Aleve, Naproxen) for the next three days unless otherwise instructed by your doctor.  3. No heavy lifting (greater than 5 pounds) or straining for the next 72 hours (3 days).  4. No driving for 24 hours post procedure due to medication/sedation you may have received during your procedure.  5. Avoid alcohol for the next 24 hours after the procedure if you received sedation.    Site Care:   1. You may shower and remove the dressing tomorrow.    2. You may leave the site uncovered or replace with a new dry dressing or Band-Aid each day until healed.  3. Do NOT use creams, powders, lotions, or ointments at the site.   4. Bruising sometimes occurs at the site where the needle was inserted.    Call  the Interventional Radiologist if you observe:  1. Prolonged oozing of blood from the biopsy site  2. Pain at the site for more than 3 days    3. Increased pain or unrelieved pain  4. Dizziness or lightheadedness  5. Difficulty breathing or shortness of breath  6. Redness, warmth to touch, or discharge from biopsy site  7. Coughing up blood (more than 3 teaspoons of blood is significant)  8. Chest pain      If you have questions or concerns, please call an Interventional Radiologist:    Contact Numbers:    Regular business hours (8A-5P M-F):  [facility]:  [phone] option 3  [facility]:   [phone]  Tyson Babinski [facility]: [phone]    After hours:  Answering service:  [phone]

## 2017-02-18 NOTE — Sedation Documentation (Signed)
Pathology tech has specimens prepared with and without heparin.

## 2017-02-18 NOTE — Brief Op Note (Signed)
BRIEF IR PROCEDURE NOTE    Date Time: 02/18/17 11:46 AM    Patient Name:   Bethany Howell    Date of Operation:   02/18/17    Providers Performing:   Vickki Muff, MD      Operative Procedure:   CT GUIDED BONE MARROW BIOPSY    Preoperative Diagnosis:   02/18/2017    Postoperative Diagnosis:   * No surgery found *    Anesthesia:   ( x ) FENTANYL 50 mcg IVP  ( x ) VERSED 2 mg IVP  ( x ) LOCAL  (  ) GENERAL ANESTHESIA (DEPT OF ANESTHESIOLOGY) )    Estimated Blood Loss:   20 mL      CONTRAST   none    RADIATION DOSE   PERFORMED UNDER CT FLUORO.  18.26 mGy USED.    Findings:   CT guided aspirate and biopsy performed with drill device via right posterior iliac crest    Complications:   none      Signed by: Vickki Muff, MD                                                                              * No surgery found *

## 2017-02-18 NOTE — Sedation Documentation (Signed)
10ml Lidocaine injected into bx site.

## 2017-02-18 NOTE — Discharge Summary -  Nursing (Signed)
Pt tolerated procedure well. Denied pain after procedure.  VSS.  Had PO without difficulty.      Prior to leaving, reviewed discharge instructions with pt and daughter.  Copy given to both and pt signed.  Pt ambulated independently. IV removed intact.  Escorted pt to entrance of building for discharge to home with daughter.

## 2017-02-18 NOTE — H&P (Signed)
BRIEF IR HISTORY AND PHYSICAL    Date Time: 02/18/17 10:42 AM    INDICATIONS:   Monoclonal gammopathy.  For CT guided bone marrow biopsy.  Anticoagulated for A fib, with last dose of anticoagulant Monday.      PROCEDURALIST COMMENTS BELOW:   As above    PAST MEDICAL HISTORY:     Past Medical History:   Diagnosis Date   . Arthritis    . Atrial fibrillation     with pacer    . Basal cell carcinoma    . Cardiomyopathy    . Hypercholesterolemia    . Hypertension    . Neuropathy     in feet       PAST SURGICAL HISTORY     Past Surgical History:   Procedure Laterality Date   . CARDIAC PACEMAKER PLACEMENT     . COLONOSCOPY N/A 04/06/2015    Procedure: COLONOSCOPY;  Surgeon: Nani Skillern, MD;  Location: [facility];  Service: Gastroenterology;  Laterality: N/A;  w/ polypectomy   . HYSTERECTOMY     . REPLACEMENT TOTAL KNEE         Family History:   No family history on file.    Social History:     Social History     Social History   . Marital status: Widowed     Spouse name: N/A   . Number of children: N/A   . Years of education: N/A     Social History Main Topics   . Smoking status: Never Smoker   . Smokeless tobacco: Not on file   . Alcohol use No   . Drug use: No   . Sexual activity: Not on file     Other Topics Concern   . Not on file     Social History Narrative   . No narrative on file         REVIEW OF SYSTEMS REVIEWED:   YES  (  )        HOME MEDICATIONS     Prior to Admission medications    Medication Sig Start Date End Date Taking? Authorizing Provider   atorvastatin (LIPITOR) 10 MG tablet Take 10 mg by mouth every evening.       Yes [provider]   Cholecalciferol (VITAMIN D-3 PO) Take 1,000 mg by mouth daily.      Yes [provider]   diazePAM (VALIUM) 5 MG tablet Take 5 mg by mouth every 6 (six) hours as needed (vaginal suppository for spasms).   Yes [provider]   digoxin (LANOXIN) 0.125 MG tablet Take 1 tablet (125 mcg total) by mouth daily. 04/24/15  Yes Bynum Bellows R, FNP   furosemide (LASIX) 20 MG tablet Take 2 tablets (40 mg total) by mouth daily. 03/13/16  Yes Marella Chimes, MD   gabapentin (NEURONTIN) 100 MG capsule Take 1 capsule (100 mg total) by mouth 3 (three) times daily. 04/24/15  Yes Delgado, Beckie Busing R, FNP   losartan (COZAAR) 50 MG tablet Take 1 tablet (50 mg total) by mouth daily. 03/13/16  Yes Marella Chimes, MD   metoprolol XL (TOPROL-XL) 50 MG 24 hr tablet Take 50 mg by mouth 2 (two) times daily.   Yes [provider]   montelukast (SINGULAIR) 10 MG tablet Take 1 tablet (10 mg total) by mouth nightly. 03/13/16  Yes Marella Chimes, MD   Potassium (POTASSIMIN PO) Take 10 mEq by mouth 3 (three) times daily.  Yes [provider]   rivaroxaban (XARELTO) 20 MG Tab Take 20 mg by mouth daily with dinner.   Yes [provider]   traZODone (DESYREL) 50 MG tablet Take 50 mg by mouth nightly.   Yes [provider]   albuterol (PROVENTIL HFA;VENTOLIN HFA) 108 (90 BASE) MCG/ACT inhaler Inhale 2 puffs into the lungs.    [provider]   albuterol (PROVENTIL) (2.5 MG/3ML) 0.083% nebulizer solution Take 3 mLs (2.5 mg total) by nebulization every 6 (six) hours as needed for Wheezing. 03/13/16   Marella Chimes, MD   budesonide-formoterol [facility]) 160-4.5 MCG/ACT inhaler Inhale 2 puffs into the lungs 2 (two) times daily.    [provider]   guaiFENesin-dextromethorphan (ROBITUSSIN DM) 100-10 MG/5ML syrup Take 5 mLs by mouth every 4 (four) hours as needed for Cough. 03/13/16   Marella Chimes, MD   lactobacillus/streptococcus (RISAQUAD) Cap Take 1 capsule by mouth daily. 03/13/16   Marella Chimes, MD   levoFLOXacin (LEVAQUIN) 500 MG tablet Take 1 tablet (500 mg total) by mouth daily. 03/13/16   Marella Chimes, MD   predniSONE (DELTASONE) 10 MG tablet Prednisone 30 mg po q day   Taper decreasing by 10 mg every 2 days until its off 03/13/16   Marella Chimes, MD         INPATIENT MEDICATIONS        n/a      ALLERGIES:      Allergies   Allergen Reactions   . Codeine Nausea And Vomiting     Liquid codeine taken for excessive coughing         PREVIOUS REACTION TO SEDATION MEDICATIONS     NO ( x  )   YES ( )      PHYSICAL EXAM     AIRWAY CLASSIFICATION:    CLASS I   (  )   CLASS II  (x  )    CLASS III  (  )     CLASS IV  (  )    INTUBATED (  )    CARDIAC :   (x  )  RRR  (  )  IRREG  (  )  MURMUR      LUNGS:   ( x )  CLEAR  (  )  DIMINISHED    (  ) LEFT   (  )  RIGHT  (  )  ABSENT          (  ) LEFT   (  )  RIGHT  (  )  TUBES            (  ) LEFT   (  )  RIGHT          ABDOMEN:   Soft, non tender    NEURO:   Alert, oriented x3      LABS:     Lab Results   Component Value Date/Time    WBC 8.61 03/12/2016 12:35 PM    HCT 39.7 03/12/2016 12:35 PM    PLT 164 03/12/2016 12:35 PM    INR 1.5 03/07/2016 01:18 PM    PT 18.0 (H) 03/07/2016 01:18 PM    PTT 37 03/07/2016 01:18 PM    BUN 31.5 (H) 03/12/2016 12:35 PM    CREAT 0.8 03/12/2016 12:35 PM    CREAT 0.56 (L) 04/24/2015 03:18 PM    GLU 94 03/12/2016 12:35 PM    K 3.9 03/12/2016 12:35 PM  ASA PHYSICAL STATUS   (  )  ASA 1   HEALTHY PATIENT  (  )  ASA 2   MILD SYSTEMIC ILLNESS  ( x )  ASA 3   SYSTEMIC DISEASE, NOT INCAPACITATING  (  )  ASA 4   SEVERE SYSTEMIC DISEASE, DISEASE IS CONSTANT THREAT TO LIFE  (  )  ASA 5   MORIBUND CONDITION, NOT EXPECTED TO LIVE >24 HOURS            IRRESPECTIVE OF PROCEDURE  (  )  E           EMERGENCY PROCEDURE       PLANNED SEDATION:   (  ) NO SEDATION  (x  ) MODERATE SEDATION  (  ) DEEP SEDATION WITH ANESTHESIA      CONCLUSION:   PATIENT HAS BEEN REASSESSED IMMEDIATELY PRIOR TO THE PROCEDURE   AND IS AN APPROPRIATE CANDIDATE FOR THE PLANNED SEDATION AND   PROCEDURE.  RISKS, BENEFITS AND ALTERNATIVES TO THE PLANNED   PROCEDURE AND SEDATION HAVE BEEN EXPLAINED TO THE PATIENT   OR GUARDIAN.    (x  )  YES  (  )  EMERGENCY CONSENT       Signed by: [facility]-Section of Vascular & Interventional Radiology  Contact  Numbers:  Regular business hours (8A-5P M-F):  [facility]: [phone] (option 3-outpatient scheduling, option 4-consults, option 5-inpatient procedures)  [facility]: [phone]  [facility]: [phone]  After hours/Answering service: [phone]

## 2017-02-23 LAB — LAB USE ONLY - HISTORICAL NON-GYN MEDICAL CYTOLOGY

## 2017-02-23 LAB — LAB USE ONLY - HISTORICAL SURGICAL PATHOLOGY

## 2017-02-27 DIAGNOSIS — D472 Monoclonal gammopathy: Secondary | ICD-10-CM | POA: Diagnosis not present

## 2017-02-27 IMAGING — MG MM DIGITAL SCREENING BILAT W/ TOMO W/ CAD
9 of 12 series · 9 of 28 positions shown · non-contrast
Comparison: Previous exam(s).

CLINICAL DATA: Screening.

EXAM:
DIGITAL SCREENING BILATERAL MAMMOGRAM WITH 3D TOMO WITH CAD

[L CC]
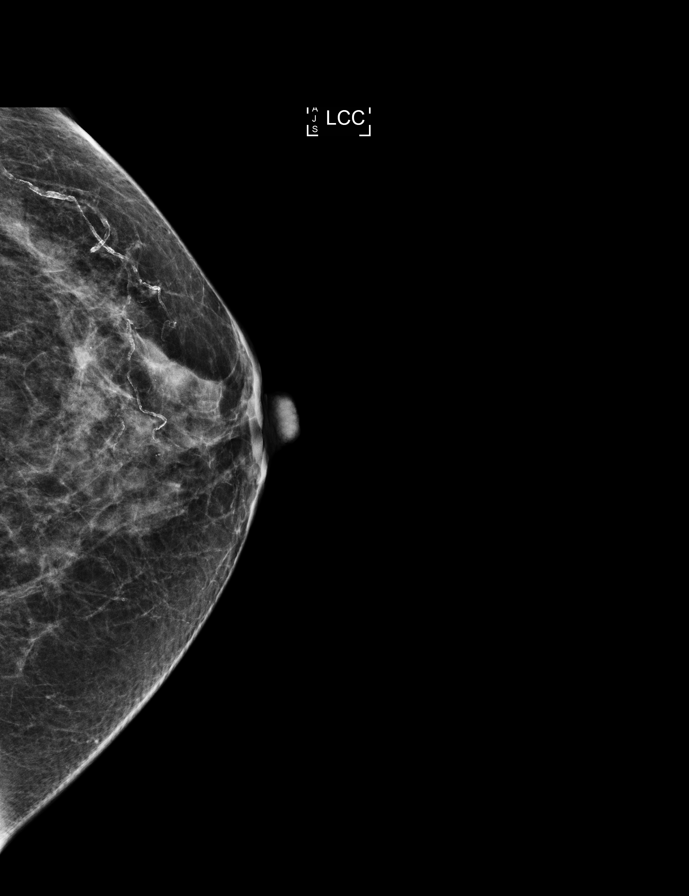

[L MLO]
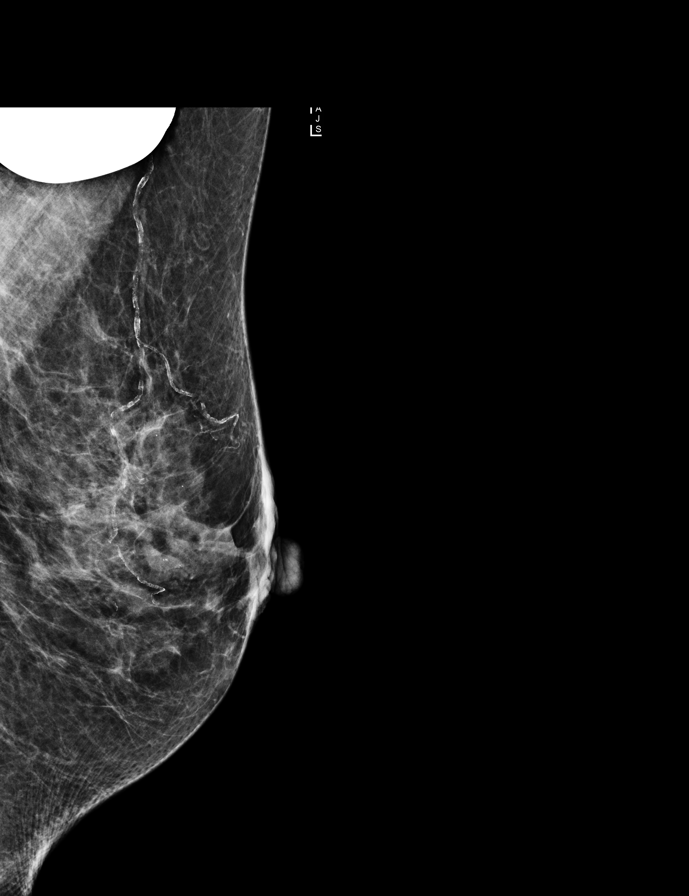

[L CC synth-2D]
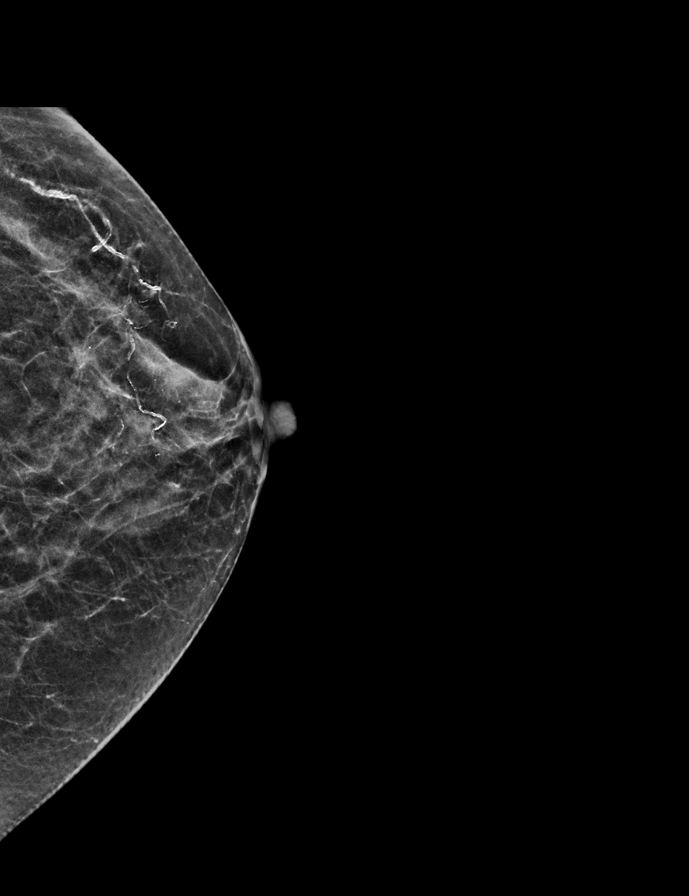

[L MLO synth-2D]
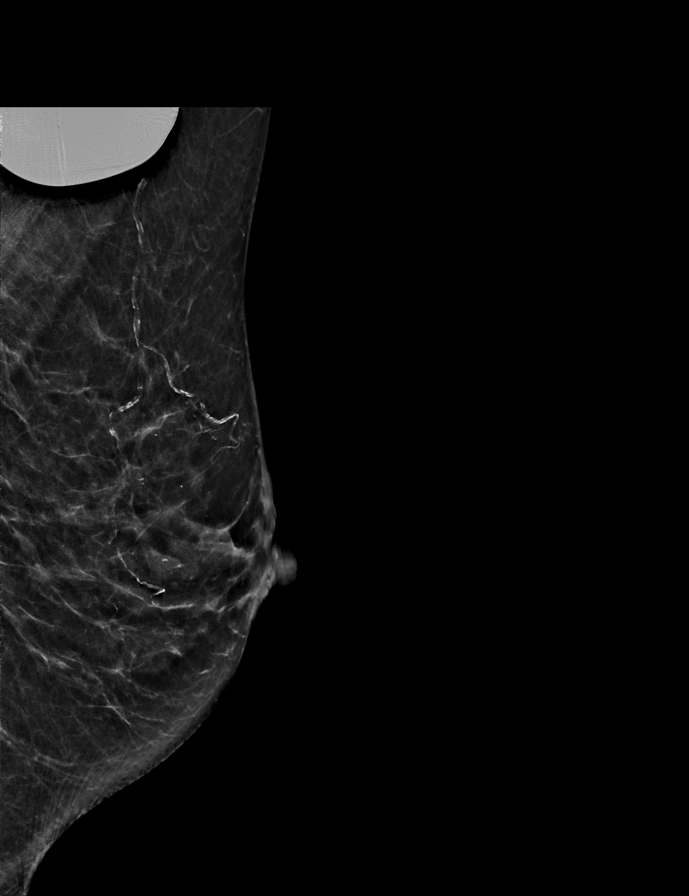

[R MLO]
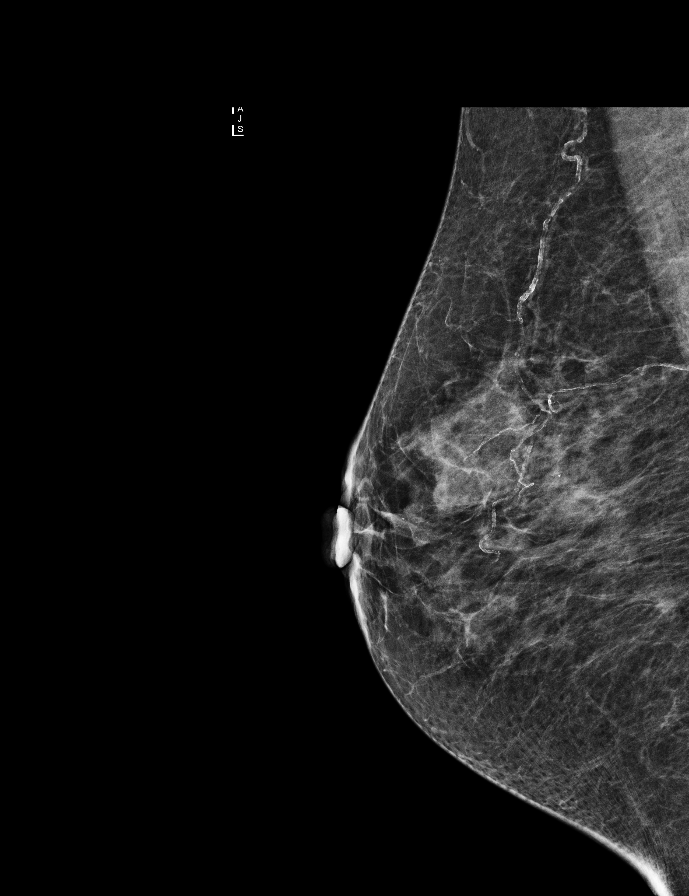

[R CC synth-2D]
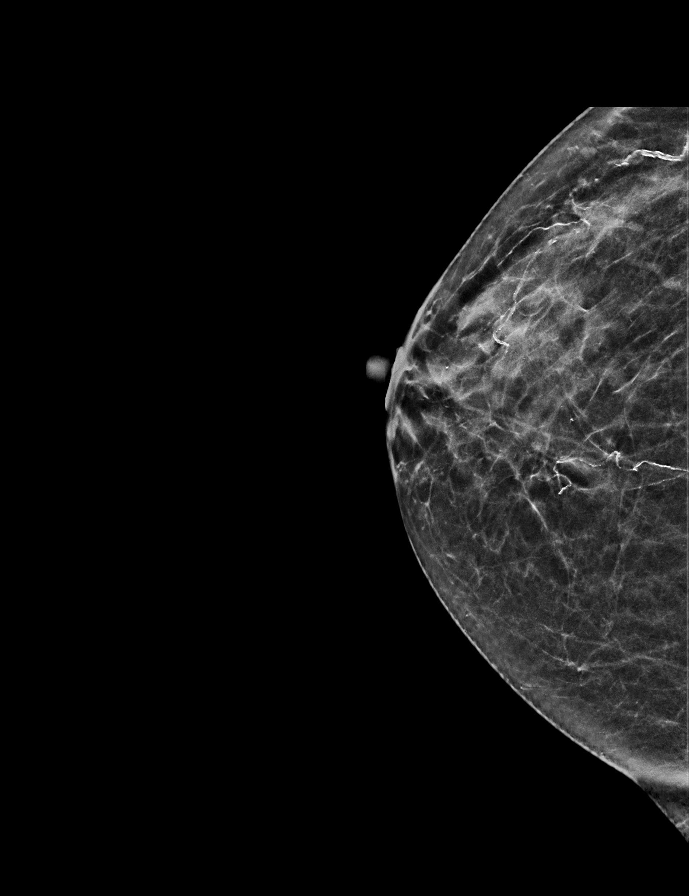

[R MLO synth-2D]
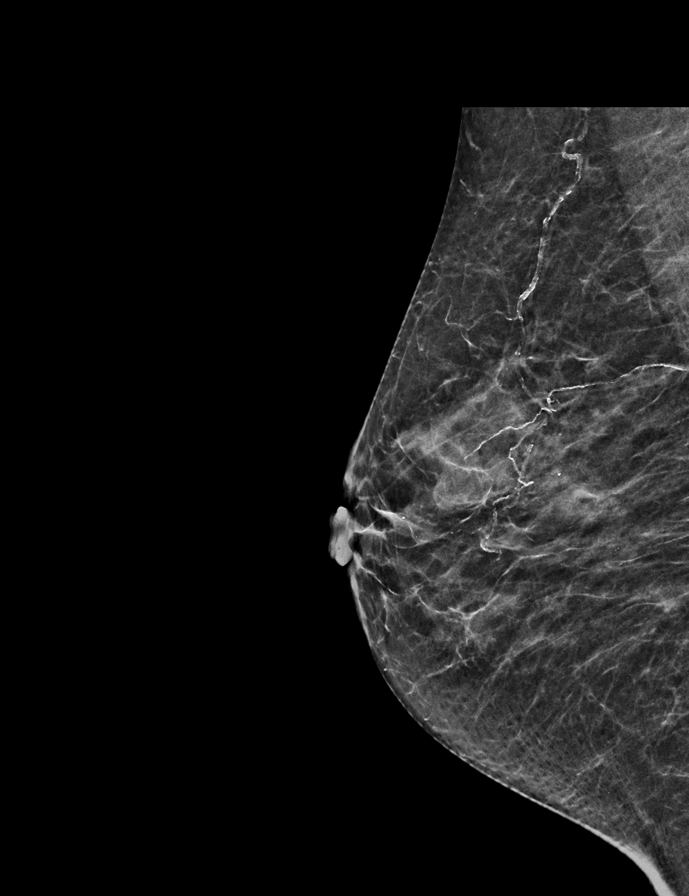

[R CC]
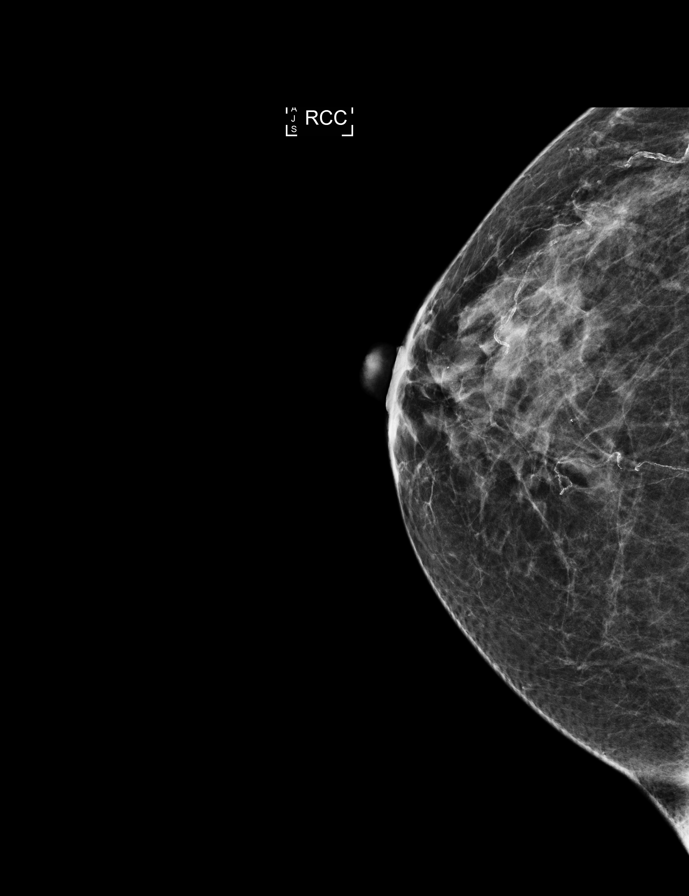

[R MLO tomo · tomo slice 23/45.0]
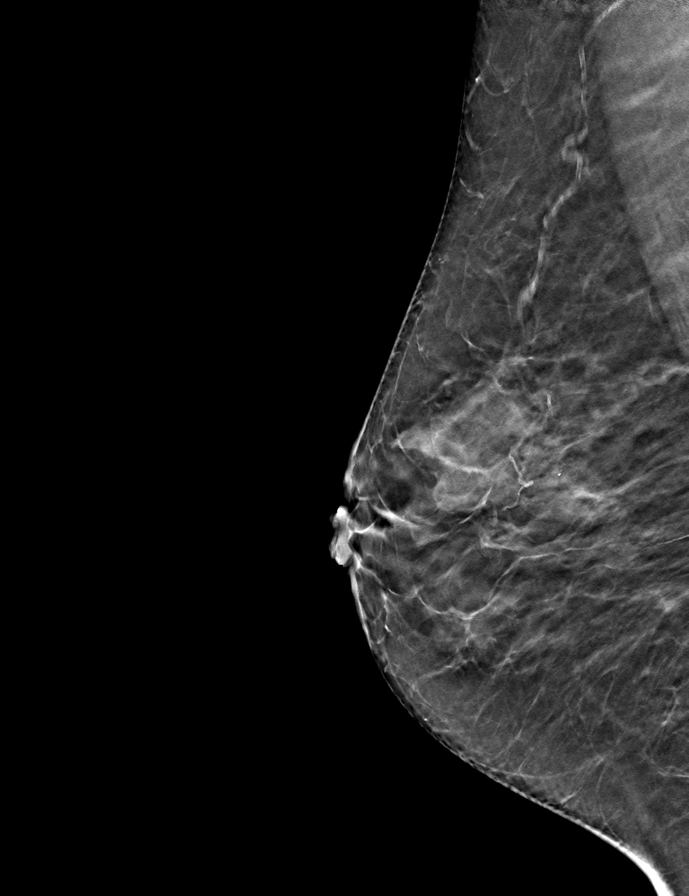

[9 of 28 positions shown; findings below may reference images not displayed]

ACR Breast Density Category b: There are scattered areas of
fibroglandular density.
FINDINGS: There are no findings suspicious for malignancy. Images were
processed with CAD.
IMPRESSION: No mammographic evidence of malignancy. A result letter of this
screening mammogram will be mailed directly to the patient.

RECOMMENDATION:
Screening mammogram in one year. (Code:55-L-23V)

BI-RADS CATEGORY  1: Negative.

## 2017-03-10 DIAGNOSIS — G4733 Obstructive sleep apnea (adult) (pediatric): Secondary | ICD-10-CM | POA: Diagnosis not present

## 2017-03-10 DIAGNOSIS — I495 Sick sinus syndrome: Secondary | ICD-10-CM | POA: Insufficient documentation

## 2017-03-10 DIAGNOSIS — R600 Localized edema: Secondary | ICD-10-CM | POA: Diagnosis not present

## 2017-03-10 DIAGNOSIS — Z9989 Dependence on other enabling machines and devices: Secondary | ICD-10-CM | POA: Diagnosis not present

## 2017-03-10 DIAGNOSIS — I482 Chronic atrial fibrillation: Secondary | ICD-10-CM | POA: Diagnosis not present

## 2017-03-10 DIAGNOSIS — I2510 Atherosclerotic heart disease of native coronary artery without angina pectoris: Secondary | ICD-10-CM | POA: Diagnosis not present

## 2017-03-13 ENCOUNTER — Telehealth: Payer: Self-pay

## 2017-03-13 DIAGNOSIS — E785 Hyperlipidemia, unspecified: Secondary | ICD-10-CM | POA: Diagnosis not present

## 2017-03-13 DIAGNOSIS — J189 Pneumonia, unspecified organism: Secondary | ICD-10-CM | POA: Diagnosis not present

## 2017-03-13 DIAGNOSIS — I480 Paroxysmal atrial fibrillation: Secondary | ICD-10-CM | POA: Diagnosis not present

## 2017-03-13 DIAGNOSIS — R600 Localized edema: Secondary | ICD-10-CM | POA: Diagnosis not present

## 2017-03-13 NOTE — Telephone Encounter (Signed)
Called and left message on patient voicemail that we dont have any records from Dr in [place].

## 2017-03-13 NOTE — Telephone Encounter (Signed)
-----  Message from Lloyd Huger, MD sent at 03/13/2017 12:31 PM EDT ----- Regarding: RE: Biopsy Results Contact: [phone] I have not seen anythng yet.  ----- Message ----- From: Wallene Dales Sent: 03/13/2017   9:21 AM To: Lloyd Huger, MD, Luretha Murphy, CMA Subject: Biopsy Results                                 Patient wants to know if you received the Bone Marrow biopsy results from her provider in [place]. She had the procedure in March 2018.

## 2017-03-13 NOTE — Telephone Encounter (Signed)
Patient called back, she is going to bring her copy Monday to [place]. She said it was not cancer but it does show something different.

## 2017-03-16 DIAGNOSIS — R21 Rash and other nonspecific skin eruption: Secondary | ICD-10-CM | POA: Diagnosis not present

## 2017-03-16 DIAGNOSIS — L299 Pruritus, unspecified: Secondary | ICD-10-CM | POA: Diagnosis not present

## 2017-03-31 DIAGNOSIS — L299 Pruritus, unspecified: Secondary | ICD-10-CM | POA: Diagnosis not present

## 2017-03-31 DIAGNOSIS — I482 Chronic atrial fibrillation: Secondary | ICD-10-CM | POA: Diagnosis not present

## 2017-03-31 DIAGNOSIS — D538 Other specified nutritional anemias: Secondary | ICD-10-CM | POA: Diagnosis not present

## 2017-03-31 DIAGNOSIS — Z9989 Dependence on other enabling machines and devices: Secondary | ICD-10-CM | POA: Diagnosis not present

## 2017-03-31 DIAGNOSIS — I10 Essential (primary) hypertension: Secondary | ICD-10-CM | POA: Diagnosis not present

## 2017-03-31 DIAGNOSIS — G4733 Obstructive sleep apnea (adult) (pediatric): Secondary | ICD-10-CM | POA: Diagnosis not present

## 2017-03-31 DIAGNOSIS — Z79899 Other long term (current) drug therapy: Secondary | ICD-10-CM | POA: Diagnosis not present

## 2017-03-31 DIAGNOSIS — I495 Sick sinus syndrome: Secondary | ICD-10-CM | POA: Diagnosis not present

## 2017-03-31 DIAGNOSIS — Z1329 Encounter for screening for other suspected endocrine disorder: Secondary | ICD-10-CM | POA: Diagnosis not present

## 2017-03-31 DIAGNOSIS — R0602 Shortness of breath: Secondary | ICD-10-CM | POA: Diagnosis not present

## 2017-03-31 DIAGNOSIS — E782 Mixed hyperlipidemia: Secondary | ICD-10-CM | POA: Diagnosis not present

## 2017-04-05 NOTE — Progress Notes (Signed)
[place]  [phone] [phone]  ID: Cynthia Wallace OB: June 02, 1936  MR#: [id number]  JKK#:[id number]  Patient Care Team: Idelle Crouch, MD as PCP - General (Internal Medicine)  CHIEF COMPLAINT: Thrombocytopenia, MGUS.  INTERVAL HISTORY: Patient is referred back to clinic today for declining hemoglobin. Patient states she has chronic weakness and fatigue and is persistently cold. She otherwise feels well and is asymptomatic. She denies any easy bleeding or bruising.  She has no neurologic complaints. She denies any recent fevers or illnesses. She has a good appetite and denies weight loss. She has no chest pain or shortness of breath. She denies any nausea, vomiting, constipation, or diarrhea. She has no urinary complaint. Patient offers no further specific complaints today.   REVIEW OF SYSTEMS:   Review of Systems  Constitutional: Positive for malaise/fatigue. Negative for fever and weight loss.  Respiratory: Negative.  Negative for cough and shortness of breath.   Cardiovascular: Negative.  Negative for chest pain and leg swelling.  Gastrointestinal: Negative.  Negative for abdominal pain.  Genitourinary: Negative.   Musculoskeletal: Negative.   Skin: Negative.  Negative for rash.  Neurological: Positive for sensory change and weakness.  Endo/Heme/Allergies: Negative.  Does not bruise/bleed easily.  Psychiatric/Behavioral: Negative.  The patient is not nervous/anxious.     As per HPI. Otherwise, a complete review of systems is negative.  PAST MEDICAL HISTORY: Past Medical History:  Diagnosis Date  . Acid reflux `  . Adenomatous colon polyp   . Anemia   . Arthritis   . Asthma   . Atrial fibrillation ([place])   . Bilateral leg edema   . Chickenpox   . Chronic a-fib ([place])   . Coronary artery disease   . Fibrocystic breast disease   . Hyperlipidemia   . Hypertension   . Iron deficiency anemia   . Osteoporosis   . PUD (peptic ulcer  disease)   . Sleep apnea   . Tricuspid valve insufficiency     PAST SURGICAL HISTORY: Past Surgical History:  Procedure Laterality Date  . ABDOMINAL HYSTERECTOMY    . BILATERAL KNEE ARTHROSCOPY    . BREAST EXCISIONAL BIOPSY Right 1990   NEG  . CARDIAC CATHETERIZATION    . COLONOSCOPY WITH PROPOFOL N/A 08/27/2016   Procedure: COLONOSCOPY WITH PROPOFOL;  Surgeon: Manya Silvas, MD;  Location: [facility];  Service: Endoscopy;  Laterality: N/A;  . INSERT / REPLACE / REMOVE PACEMAKER    . JOINT REPLACEMENT    . PACEMAKER INSERTION    . TUBAL LIGATION      FAMILY HISTORY: Family History  Problem Relation Age of Onset  . Hyperlipidemia Mother   . Hypertension Mother   . Heart disease Mother   . Heart disease Father   . Cancer Sister   . Breast cancer Sister   . Stroke Sister   . Hyperlipidemia Sister   . Heart disease Sister     ADVANCED DIRECTIVES (Y/N):  N  HEALTH MAINTENANCE: Social History  Substance Use Topics  . Smoking status: Never Smoker  . Smokeless tobacco: Never Used  . Alcohol use No     Colonoscopy:  PAP:  Bone density:  Lipid panel:  Allergies  Allergen Reactions  . Codeine Nausea Only  . Penicillin G Other (See Comments)    As a child    Current Outpatient Prescriptions  Medication Sig Dispense Refill  . atorvastatin (LIPITOR) 10 MG tablet Take 10 mg by mouth.    . digoxin (LANOXIN)  0.125 MG tablet Take 0.125 mg by mouth daily.     . furosemide (LASIX) 20 MG tablet Take 40 mg by mouth daily. Pt to take 2 tablets once a day     . gabapentin (NEURONTIN) 100 MG capsule Take 100 mg by mouth 3 (three) times daily.    . hydrOXYzine (ATARAX/VISTARIL) 10 MG tablet Take 1 tablet by mouth 3 (three) times daily.    Marland Kitchen losartan (COZAAR) 50 MG tablet Take 50 mg by mouth daily.    . Melatonin 10 MG TABS Take 1 tablet by mouth at bedtime as needed. Takes at night     . metoprolol succinate (TOPROL-XL) 50 MG 24 hr tablet Take 50 mg by mouth 2 (two)  times daily.    . montelukast (SINGULAIR) 10 MG tablet Take 10 mg by mouth at bedtime.    . potassium chloride (K-DUR) 10 MEQ tablet Take 10 mEq by mouth 3 (three) times daily.     . rivaroxaban (XARELTO) 20 MG TABS tablet Take 20 mg by mouth daily with supper.    . traZODone (DESYREL) 50 MG tablet Take 50 mg by mouth at bedtime.    . triamcinolone cream (KENALOG) 0.5 % Apply 1 application topically 2 (two) times daily as needed.     No current facility-administered medications for this visit.     OBJECTIVE: Vitals:   04/06/17 1531  BP: (!) 151/92  Pulse: 91  Resp: 18  Temp: 97.8 F (36.6 C)     Body mass index is 35.85 kg/m.    ECOG FS:0 - Asymptomatic  General: Well-developed, well-nourished, no acute distress. Eyes: Pink conjunctiva, anicteric sclera. Lungs: Clear to auscultation bilaterally. Heart: Regular rate and rhythm. No rubs, murmurs, or gallops. Abdomen: Soft, nontender, nondistended. No organomegaly noted, normoactive bowel sounds. Musculoskeletal: No edema, cyanosis, or clubbing. Neuro: Alert, answering all questions appropriately. Cranial nerves grossly intact. Skin: No rashes or petechiae noted. Psych: Normal affect.  LAB RESULTS:  Lab Results  Component Value Date   NA 139 03/05/2015   K 4.1 03/05/2015   CL 106 03/05/2015   CO2 28 03/05/2015   GLUCOSE 92 03/05/2015   BUN 23 (H) 03/05/2015   CREATININE 0.75 03/05/2015   CALCIUM 8.9 03/05/2015   GFRNONAA >60 03/05/2015   GFRAA >60 03/05/2015    Lab Results  Component Value Date   WBC 5.0 09/15/2016   NEUTROABS 2.9 09/15/2016   HGB 11.3 (L) 09/15/2016   HCT 33.8 (L) 09/15/2016   MCV 92.7 09/15/2016   PLT 133 (L) 09/15/2016   Lab Results  Component Value Date   TOTALPROTELP 7.6 09/15/2016   ALBUMINELP 3.6 09/15/2016   A1GS 0.3 09/15/2016   A2GS 0.8 09/15/2016   BETS 1.1 09/15/2016   GAMS 1.9 (H) 09/15/2016   MSPIKE 1.2 (H) 09/15/2016   SPEI Comment 09/15/2016     STUDIES: No results  found.  ASSESSMENT: Thrombocytopenia, MGUS.  PLAN:    1. MGUS: Patient noted to have a mildly elevated M spike of 1.2. She has a mild thrombocytopenia and anemia, but no other evidence of endorgan damage. See addendum below regarding metastatic bone survey and bone marrow biopsy. Given her multiple comorbidities, if patient ever proceeded to overt multiple myeloma treatment would be difficult. Patient is driving back to [place] this Wednesday therefore she was given an order for laboratory work to be drawn there and faxed to clinic later. Return to clinic in 6 months with repeat laboratory work and further evaluation.  2. Thrombocytopenia:  Mild. The remainder of her laboratory work other than her elevated M spike is either negative or within normal limits. No intervention is needed at this time. Return to clinic as above.  3. Anemia: Will include iron stores with next lab draw. Follow-up as above. 4. Persistent weakness and fatigue/cold: Thyroid panel has been ordered.  Patient expressed understanding and was in agreement with this plan. She also understands that She can call clinic at any time with any questions, concerns, or complaints.    Lloyd Huger, MD   04/06/2017 3:46 PM   Addendum: Patient had a bone survey at an outside facility on February 12, 2017 that did not reveal any evidence of lytic lesions. She had a bone marrow biopsy on February 23, 2017 which flow cytometry revealed 2% monoclonal plasma cells. Immunohistochemical stains revealed scattered focally small clusters of plasma cell approximate 6-13% of total bone marrow cells. These results are consistent with a diagnosis of MGUS.

## 2017-04-06 ENCOUNTER — Inpatient Hospital Stay: Payer: PPO | Attending: Oncology | Admitting: Oncology

## 2017-04-06 ENCOUNTER — Inpatient Hospital Stay: Payer: PPO

## 2017-04-06 VITALS — BP 151/92 | HR 91 | Temp 97.8°F | Resp 18 | Wt 222.1 lb

## 2017-04-06 DIAGNOSIS — Z8711 Personal history of peptic ulcer disease: Secondary | ICD-10-CM | POA: Diagnosis not present

## 2017-04-06 DIAGNOSIS — Z803 Family history of malignant neoplasm of breast: Secondary | ICD-10-CM | POA: Diagnosis not present

## 2017-04-06 DIAGNOSIS — I482 Chronic atrial fibrillation: Secondary | ICD-10-CM | POA: Insufficient documentation

## 2017-04-06 DIAGNOSIS — I2510 Atherosclerotic heart disease of native coronary artery without angina pectoris: Secondary | ICD-10-CM | POA: Insufficient documentation

## 2017-04-06 DIAGNOSIS — K219 Gastro-esophageal reflux disease without esophagitis: Secondary | ICD-10-CM | POA: Diagnosis not present

## 2017-04-06 DIAGNOSIS — R531 Weakness: Secondary | ICD-10-CM | POA: Diagnosis not present

## 2017-04-06 DIAGNOSIS — Z7901 Long term (current) use of anticoagulants: Secondary | ICD-10-CM | POA: Diagnosis not present

## 2017-04-06 DIAGNOSIS — R5381 Other malaise: Secondary | ICD-10-CM

## 2017-04-06 DIAGNOSIS — E785 Hyperlipidemia, unspecified: Secondary | ICD-10-CM | POA: Insufficient documentation

## 2017-04-06 DIAGNOSIS — D649 Anemia, unspecified: Secondary | ICD-10-CM | POA: Diagnosis not present

## 2017-04-06 DIAGNOSIS — Z79899 Other long term (current) drug therapy: Secondary | ICD-10-CM

## 2017-04-06 DIAGNOSIS — D472 Monoclonal gammopathy: Secondary | ICD-10-CM | POA: Diagnosis not present

## 2017-04-06 DIAGNOSIS — G4730 Sleep apnea, unspecified: Secondary | ICD-10-CM

## 2017-04-06 DIAGNOSIS — Z880 Allergy status to penicillin: Secondary | ICD-10-CM | POA: Diagnosis not present

## 2017-04-06 DIAGNOSIS — R5382 Chronic fatigue, unspecified: Secondary | ICD-10-CM | POA: Insufficient documentation

## 2017-04-06 DIAGNOSIS — D696 Thrombocytopenia, unspecified: Secondary | ICD-10-CM | POA: Insufficient documentation

## 2017-04-06 LAB — BASIC METABOLIC PANEL
Anion gap: 7 (ref 5–15)
BUN: 17 mg/dL (ref 6–20)
CO2: 26 mmol/L (ref 22–32)
Calcium: 9.1 mg/dL (ref 8.9–10.3)
Chloride: 104 mmol/L (ref 101–111)
Creatinine, Ser: 0.82 mg/dL (ref 0.44–1.00)
GFR calc Af Amer: >60 mL/min (ref >60)
GFR calc non Af Amer: >60 mL/min (ref >60)
Glucose, Bld: 85 mg/dL (ref 65–99)
Potassium: 3.7 mmol/L (ref 3.5–5.1)
Sodium: 137 mmol/L (ref 135–145)

## 2017-04-06 LAB — CBC
HCT: 31.1 % — ABNORMAL LOW (ref 35.0–47.0)
Hemoglobin: 10.4 g/dL — ABNORMAL LOW (ref 12.0–16.0)
MCH: 31.4 pg (ref 26.0–34.0)
MCHC: 33.5 g/dL (ref 32.0–36.0)
MCV: 93.9 fL (ref 80.0–100.0)
Platelets: 145 10*3/uL — ABNORMAL LOW (ref 150–440)
RBC: 3.31 MIL/uL — ABNORMAL LOW (ref 3.80–5.20)
RDW: 15.9 % — ABNORMAL HIGH (ref 11.5–14.5)
WBC: 5.3 10*3/uL (ref 3.6–11.0)

## 2017-04-06 LAB — FERRITIN: Ferritin: 114 ng/mL (ref 11–307)

## 2017-04-06 LAB — IRON AND TIBC
Iron: 66 ug/dL (ref 28–170)
Saturation Ratios: 22 % (ref 10.4–31.8)
TIBC: 302 ug/dL (ref 250–450)
UIBC: 236 ug/dL

## 2017-04-06 NOTE — Progress Notes (Signed)
Complains of shortness of breath, chest tightness, and fatigue which has been ongoing for the past 2-3 weeks.

## 2017-04-07 LAB — KAPPA/LAMBDA LIGHT CHAINS
Kappa free light chain: 64.4 mg/L — ABNORMAL HIGH (ref 3.3–19.4)
Kappa, lambda light chain ratio: 3.11 — ABNORMAL HIGH (ref 0.26–1.65)
Lambda free light chains: 20.7 mg/L (ref 5.7–26.3)

## 2017-04-07 LAB — IGG, IGA, IGM
IgA: 156 mg/dL (ref 64–422)
IgG (Immunoglobin G), Serum: 2550 mg/dL — ABNORMAL HIGH (ref 700–1600)
IgM, Serum: 72 mg/dL (ref 26–217)

## 2017-04-07 LAB — PROTEIN ELECTROPHORESIS, SERUM
A/G Ratio: 0.8 (ref 0.7–1.7)
Albumin ELP: 3.6 g/dL (ref 2.9–4.4)
Alpha-1-Globulin: 0.3 g/dL (ref 0.0–0.4)
Alpha-2-Globulin: 0.7 g/dL (ref 0.4–1.0)
Beta Globulin: 1 g/dL (ref 0.7–1.3)
Gamma Globulin: 2.4 g/dL — ABNORMAL HIGH (ref 0.4–1.8)
Globulin, Total: 4.4 g/dL — ABNORMAL HIGH (ref 2.2–3.9)
M-Spike, %: 1.7 g/dL — ABNORMAL HIGH
Total Protein ELP: 8 g/dL (ref 6.0–8.5)

## 2017-04-07 LAB — THYROID PANEL WITH TSH
Free Thyroxine Index: 1.5 (ref 1.2–4.9)
T3 Uptake Ratio: 24 % (ref 24–39)
T4, Total: 6.1 ug/dL (ref 4.5–12.0)
TSH: 5.45 u[IU]/mL — ABNORMAL HIGH (ref 0.450–4.500)

## 2017-04-07 LAB — BETA 2 MICROGLOBULIN, SERUM: Beta-2 Microglobulin: 3.5 mg/L — ABNORMAL HIGH (ref 0.6–2.4)

## 2017-06-16 DIAGNOSIS — I495 Sick sinus syndrome: Secondary | ICD-10-CM | POA: Diagnosis not present

## 2017-06-17 ENCOUNTER — Other Ambulatory Visit: Payer: Self-pay | Admitting: Internal Medicine

## 2017-06-17 DIAGNOSIS — Z1231 Encounter for screening mammogram for malignant neoplasm of breast: Secondary | ICD-10-CM

## 2017-06-30 DIAGNOSIS — I495 Sick sinus syndrome: Secondary | ICD-10-CM | POA: Diagnosis not present

## 2017-06-30 DIAGNOSIS — Z79899 Other long term (current) drug therapy: Secondary | ICD-10-CM | POA: Diagnosis not present

## 2017-06-30 DIAGNOSIS — E782 Mixed hyperlipidemia: Secondary | ICD-10-CM | POA: Diagnosis not present

## 2017-06-30 DIAGNOSIS — Z0000 Encounter for general adult medical examination without abnormal findings: Secondary | ICD-10-CM | POA: Diagnosis not present

## 2017-06-30 DIAGNOSIS — I10 Essential (primary) hypertension: Secondary | ICD-10-CM | POA: Diagnosis not present

## 2017-06-30 DIAGNOSIS — G4733 Obstructive sleep apnea (adult) (pediatric): Secondary | ICD-10-CM | POA: Diagnosis not present

## 2017-06-30 DIAGNOSIS — I482 Chronic atrial fibrillation: Secondary | ICD-10-CM | POA: Diagnosis not present

## 2017-06-30 DIAGNOSIS — L299 Pruritus, unspecified: Secondary | ICD-10-CM | POA: Diagnosis not present

## 2017-06-30 DIAGNOSIS — Z9989 Dependence on other enabling machines and devices: Secondary | ICD-10-CM | POA: Diagnosis not present

## 2017-06-30 DIAGNOSIS — F5104 Psychophysiologic insomnia: Secondary | ICD-10-CM | POA: Diagnosis not present

## 2017-07-01 ENCOUNTER — Ambulatory Visit
Admission: RE | Admit: 2017-07-01 | Discharge: 2017-07-01 | Disposition: A | Discharge status: Home / Self Care | Payer: PPO | Source: Ambulatory Visit | Attending: Internal Medicine | Admitting: Internal Medicine

## 2017-07-01 DIAGNOSIS — Z1231 Encounter for screening mammogram for malignant neoplasm of breast: Secondary | ICD-10-CM | POA: Diagnosis not present

## 2017-08-19 DIAGNOSIS — D229 Melanocytic nevi, unspecified: Secondary | ICD-10-CM | POA: Diagnosis not present

## 2017-08-19 DIAGNOSIS — Z85828 Personal history of other malignant neoplasm of skin: Secondary | ICD-10-CM | POA: Diagnosis not present

## 2017-08-19 DIAGNOSIS — I8393 Asymptomatic varicose veins of bilateral lower extremities: Secondary | ICD-10-CM | POA: Diagnosis not present

## 2017-08-19 DIAGNOSIS — L578 Other skin changes due to chronic exposure to nonionizing radiation: Secondary | ICD-10-CM | POA: Diagnosis not present

## 2017-08-19 DIAGNOSIS — D485 Neoplasm of uncertain behavior of skin: Secondary | ICD-10-CM | POA: Diagnosis not present

## 2017-08-19 DIAGNOSIS — L812 Freckles: Secondary | ICD-10-CM | POA: Diagnosis not present

## 2017-08-19 DIAGNOSIS — L570 Actinic keratosis: Secondary | ICD-10-CM | POA: Diagnosis not present

## 2017-08-19 DIAGNOSIS — L821 Other seborrheic keratosis: Secondary | ICD-10-CM | POA: Diagnosis not present

## 2017-08-19 DIAGNOSIS — L820 Inflamed seborrheic keratosis: Secondary | ICD-10-CM | POA: Diagnosis not present

## 2017-08-19 DIAGNOSIS — I8310 Varicose veins of unspecified lower extremity with inflammation: Secondary | ICD-10-CM | POA: Diagnosis not present

## 2017-08-19 DIAGNOSIS — L219 Seborrheic dermatitis, unspecified: Secondary | ICD-10-CM | POA: Diagnosis not present

## 2017-08-19 DIAGNOSIS — C44319 Basal cell carcinoma of skin of other parts of face: Secondary | ICD-10-CM | POA: Diagnosis not present

## 2017-09-08 DIAGNOSIS — I071 Rheumatic tricuspid insufficiency: Secondary | ICD-10-CM | POA: Diagnosis not present

## 2017-09-08 DIAGNOSIS — I2510 Atherosclerotic heart disease of native coronary artery without angina pectoris: Secondary | ICD-10-CM | POA: Diagnosis not present

## 2017-09-08 DIAGNOSIS — I872 Venous insufficiency (chronic) (peripheral): Secondary | ICD-10-CM | POA: Diagnosis not present

## 2017-09-08 DIAGNOSIS — I482 Chronic atrial fibrillation: Secondary | ICD-10-CM | POA: Diagnosis not present

## 2017-09-30 DIAGNOSIS — I495 Sick sinus syndrome: Secondary | ICD-10-CM | POA: Diagnosis not present

## 2017-09-30 DIAGNOSIS — I10 Essential (primary) hypertension: Secondary | ICD-10-CM | POA: Diagnosis not present

## 2017-09-30 DIAGNOSIS — I482 Chronic atrial fibrillation: Secondary | ICD-10-CM | POA: Diagnosis not present

## 2017-09-30 DIAGNOSIS — E782 Mixed hyperlipidemia: Secondary | ICD-10-CM | POA: Diagnosis not present

## 2017-09-30 DIAGNOSIS — Z79899 Other long term (current) drug therapy: Secondary | ICD-10-CM | POA: Diagnosis not present

## 2017-10-06 NOTE — Progress Notes (Deleted)
[place]  [phone] Fax:(336) [phone]  ID: Cynthia Wallace OB: [birth year]  MR#: [id number]  XKP#:[id number]  Patient Care Team: Idelle Crouch, MD as PCP - General (Internal Medicine)  CHIEF COMPLAINT: Thrombocytopenia, MGUS.  INTERVAL HISTORY: Patient is referred back to clinic today for declining hemoglobin. Patient states she has chronic weakness and fatigue and is persistently cold. She otherwise feels well and is asymptomatic. She denies any easy bleeding or bruising.  She has no neurologic complaints. She denies any recent fevers or illnesses. She has a good appetite and denies weight loss. She has no chest pain or shortness of breath. She denies any nausea, vomiting, constipation, or diarrhea. She has no urinary complaint. Patient offers no further specific complaints today.   REVIEW OF SYSTEMS:   Review of Systems  Constitutional: Positive for malaise/fatigue. Negative for fever and weight loss.  Respiratory: Negative.  Negative for cough and shortness of breath.   Cardiovascular: Negative.  Negative for chest pain and leg swelling.  Gastrointestinal: Negative.  Negative for abdominal pain.  Genitourinary: Negative.   Musculoskeletal: Negative.   Skin: Negative.  Negative for rash.  Neurological: Positive for sensory change and weakness.  Endo/Heme/Allergies: Negative.  Does not bruise/bleed easily.  Psychiatric/Behavioral: Negative.  The patient is not nervous/anxious.     As per HPI. Otherwise, a complete review of systems is negative.  PAST MEDICAL HISTORY: Past Medical History:  Diagnosis Date  . Acid reflux `  . Adenomatous colon polyp   . Anemia   . Arthritis   . Asthma   . Atrial fibrillation ([place])   . Bilateral leg edema   . Chickenpox   . Chronic a-fib ([place])   . Coronary artery disease   . Fibrocystic breast disease   . Hyperlipidemia   . Hypertension   . Iron deficiency anemia   . Osteoporosis   . PUD (peptic ulcer  disease)   . Sleep apnea   . Tricuspid valve insufficiency     PAST SURGICAL HISTORY: Past Surgical History:  Procedure Laterality Date  . ABDOMINAL HYSTERECTOMY    . BILATERAL KNEE ARTHROSCOPY    . BREAST EXCISIONAL BIOPSY Right 1990   NEG  . CARDIAC CATHETERIZATION    . COLONOSCOPY WITH PROPOFOL N/A 08/27/2016   Procedure: COLONOSCOPY WITH PROPOFOL;  Surgeon: Manya Silvas, MD;  Location: [facility] ENDOSCOPY;  Service: Endoscopy;  Laterality: N/A;  . INSERT / REPLACE / REMOVE PACEMAKER    . JOINT REPLACEMENT    . PACEMAKER INSERTION    . TUBAL LIGATION      FAMILY HISTORY: Family History  Problem Relation Age of Onset  . Hyperlipidemia Mother   . Hypertension Mother   . Heart disease Mother   . Heart disease Father   . Cancer Sister   . Breast cancer Sister   . Stroke Sister   . Hyperlipidemia Sister   . Heart disease Sister     ADVANCED DIRECTIVES (Y/N):  N  HEALTH MAINTENANCE: Social History  Substance Use Topics  . Smoking status: Never Smoker  . Smokeless tobacco: Never Used  . Alcohol use No     Colonoscopy:  PAP:  Bone density:  Lipid panel:  Allergies  Allergen Reactions  . Codeine Nausea Only  . Penicillin G Other (See Comments)    As a child    Current Outpatient Prescriptions  Medication Sig Dispense Refill  . atorvastatin (LIPITOR) 10 MG tablet Take 10 mg by mouth.    . digoxin (LANOXIN)  0.125 MG tablet Take 0.125 mg by mouth daily.     . furosemide (LASIX) 20 MG tablet Take 40 mg by mouth daily. Pt to take 2 tablets once a day     . gabapentin (NEURONTIN) 100 MG capsule Take 100 mg by mouth 3 (three) times daily.    . hydrOXYzine (ATARAX/VISTARIL) 10 MG tablet Take 1 tablet by mouth 3 (three) times daily.    Marland Kitchen losartan (COZAAR) 50 MG tablet Take 50 mg by mouth daily.    . Melatonin 10 MG TABS Take 1 tablet by mouth at bedtime as needed. Takes at night     . metoprolol succinate (TOPROL-XL) 50 MG 24 hr tablet Take 50 mg by mouth 2 (two)  times daily.    . montelukast (SINGULAIR) 10 MG tablet Take 10 mg by mouth at bedtime.    . potassium chloride (K-DUR) 10 MEQ tablet Take 10 mEq by mouth 3 (three) times daily.     . rivaroxaban (XARELTO) 20 MG TABS tablet Take 20 mg by mouth daily with supper.    . traZODone (DESYREL) 50 MG tablet Take 50 mg by mouth at bedtime.    . triamcinolone cream (KENALOG) 0.5 % Apply 1 application topically 2 (two) times daily as needed.     No current facility-administered medications for this visit.     OBJECTIVE: There were no vitals filed for this visit.   There is no height or weight on file to calculate BMI.    ECOG FS:0 - Asymptomatic  General: Well-developed, well-nourished, no acute distress. Eyes: Pink conjunctiva, anicteric sclera. Lungs: Clear to auscultation bilaterally. Heart: Regular rate and rhythm. No rubs, murmurs, or gallops. Abdomen: Soft, nontender, nondistended. No organomegaly noted, normoactive bowel sounds. Musculoskeletal: No edema, cyanosis, or clubbing. Neuro: Alert, answering all questions appropriately. Cranial nerves grossly intact. Skin: No rashes or petechiae noted. Psych: Normal affect.  LAB RESULTS:  Lab Results  Component Value Date   NA 137 04/06/2017   K 3.7 04/06/2017   CL 104 04/06/2017   CO2 26 04/06/2017   GLUCOSE 85 04/06/2017   BUN 17 04/06/2017   CREATININE 0.82 04/06/2017   CALCIUM 9.1 04/06/2017   GFRNONAA >60 04/06/2017   GFRAA >60 04/06/2017    Lab Results  Component Value Date   WBC 5.3 04/06/2017   NEUTROABS 2.9 09/15/2016   HGB 10.4 (L) 04/06/2017   HCT 31.1 (L) 04/06/2017   MCV 93.9 04/06/2017   PLT 145 (L) 04/06/2017   Lab Results  Component Value Date   TOTALPROTELP 8.0 04/06/2017   ALBUMINELP 3.6 04/06/2017   A1GS 0.3 04/06/2017   A2GS 0.7 04/06/2017   BETS 1.0 04/06/2017   GAMS 2.4 (H) 04/06/2017   MSPIKE 1.7 (H) 04/06/2017   SPEI Comment 04/06/2017     STUDIES: No results found.  ASSESSMENT:  Thrombocytopenia, MGUS.  PLAN:    1. MGUS: Patient noted to have a mildly elevated M spike of 1.2. She has a mild thrombocytopenia and anemia, but no other evidence of endorgan damage. See addendum below regarding metastatic bone survey and bone marrow biopsy. Given her multiple comorbidities, if patient ever proceeded to overt multiple myeloma treatment would be difficult. Patient is driving back to [place] this Wednesday therefore she was given an order for laboratory work to be drawn there and faxed to clinic later. Return to clinic in 6 months with repeat laboratory work and further evaluation.  2. Thrombocytopenia: Mild. The remainder of her laboratory work other than her elevated  M spike is either negative or within normal limits. No intervention is needed at this time. Return to clinic as above.  3. Anemia: Will include iron stores with next lab draw. Follow-up as above. 4. Persistent weakness and fatigue/cold: Thyroid panel has been ordered.  Patient expressed understanding and was in agreement with this plan. She also understands that She can call clinic at any time with any questions, concerns, or complaints.    Lloyd Huger, MD   10/06/2017 8:35 AM   Addendum: Patient had a bone survey at an outside facility on February 12, 2017 that did not reveal any evidence of lytic lesions. She had a bone marrow biopsy on February 23, 2017 which flow cytometry revealed 2% monoclonal plasma cells. Immunohistochemical stains revealed scattered focally small clusters of plasma cell approximate 6-13% of total bone marrow cells. These results are consistent with a diagnosis of MGUS.

## 2017-10-08 ENCOUNTER — Inpatient Hospital Stay: Payer: PPO | Admitting: Oncology

## 2017-10-08 ENCOUNTER — Inpatient Hospital Stay: Payer: PPO | Attending: Oncology | Admitting: *Deleted

## 2017-10-08 DIAGNOSIS — Z880 Allergy status to penicillin: Secondary | ICD-10-CM | POA: Insufficient documentation

## 2017-10-08 DIAGNOSIS — G4730 Sleep apnea, unspecified: Secondary | ICD-10-CM | POA: Insufficient documentation

## 2017-10-08 DIAGNOSIS — Z8711 Personal history of peptic ulcer disease: Secondary | ICD-10-CM | POA: Diagnosis not present

## 2017-10-08 DIAGNOSIS — M1990 Unspecified osteoarthritis, unspecified site: Secondary | ICD-10-CM | POA: Diagnosis not present

## 2017-10-08 DIAGNOSIS — D649 Anemia, unspecified: Secondary | ICD-10-CM | POA: Diagnosis not present

## 2017-10-08 DIAGNOSIS — I2510 Atherosclerotic heart disease of native coronary artery without angina pectoris: Secondary | ICD-10-CM | POA: Diagnosis not present

## 2017-10-08 DIAGNOSIS — I482 Chronic atrial fibrillation: Secondary | ICD-10-CM | POA: Diagnosis not present

## 2017-10-08 DIAGNOSIS — E785 Hyperlipidemia, unspecified: Secondary | ICD-10-CM | POA: Diagnosis not present

## 2017-10-08 DIAGNOSIS — Z950 Presence of cardiac pacemaker: Secondary | ICD-10-CM | POA: Diagnosis not present

## 2017-10-08 DIAGNOSIS — Z79899 Other long term (current) drug therapy: Secondary | ICD-10-CM | POA: Diagnosis not present

## 2017-10-08 DIAGNOSIS — D472 Monoclonal gammopathy: Secondary | ICD-10-CM | POA: Diagnosis not present

## 2017-10-08 DIAGNOSIS — K219 Gastro-esophageal reflux disease without esophagitis: Secondary | ICD-10-CM | POA: Insufficient documentation

## 2017-10-08 DIAGNOSIS — D696 Thrombocytopenia, unspecified: Secondary | ICD-10-CM | POA: Insufficient documentation

## 2017-10-08 DIAGNOSIS — Z7901 Long term (current) use of anticoagulants: Secondary | ICD-10-CM | POA: Insufficient documentation

## 2017-10-08 DIAGNOSIS — Z803 Family history of malignant neoplasm of breast: Secondary | ICD-10-CM | POA: Insufficient documentation

## 2017-10-08 DIAGNOSIS — N6019 Diffuse cystic mastopathy of unspecified breast: Secondary | ICD-10-CM | POA: Insufficient documentation

## 2017-10-08 DIAGNOSIS — I10 Essential (primary) hypertension: Secondary | ICD-10-CM | POA: Insufficient documentation

## 2017-10-08 LAB — IRON AND TIBC
Iron: 59 ug/dL (ref 28–170)
Saturation Ratios: 23 % (ref 10.4–31.8)
TIBC: 261 ug/dL (ref 250–450)
UIBC: 202 ug/dL

## 2017-10-08 LAB — FERRITIN: Ferritin: 157 ng/mL (ref 11–307)

## 2017-10-09 LAB — PROTEIN ELECTROPHORESIS, SERUM
A/G Ratio: 0.9 (ref 0.7–1.7)
Albumin ELP: 3.5 g/dL (ref 2.9–4.4)
Alpha-1-Globulin: 0.3 g/dL (ref 0.0–0.4)
Alpha-2-Globulin: 0.7 g/dL (ref 0.4–1.0)
Beta Globulin: 0.8 g/dL (ref 0.7–1.3)
Gamma Globulin: 2.3 g/dL — ABNORMAL HIGH (ref 0.4–1.8)
Globulin, Total: 4.1 g/dL — ABNORMAL HIGH (ref 2.2–3.9)
M-Spike, %: 1.8 g/dL — ABNORMAL HIGH
Total Protein ELP: 7.6 g/dL (ref 6.0–8.5)

## 2017-10-09 LAB — KAPPA/LAMBDA LIGHT CHAINS
Kappa free light chain: 45.8 mg/L — ABNORMAL HIGH (ref 3.3–19.4)
Kappa, lambda light chain ratio: 2.81 — ABNORMAL HIGH (ref 0.26–1.65)
Lambda free light chains: 16.3 mg/L (ref 5.7–26.3)

## 2017-10-09 LAB — IGG, IGA, IGM
IgA: 128 mg/dL (ref 64–422)
IgG (Immunoglobin G), Serum: 2599 mg/dL — ABNORMAL HIGH (ref 700–1600)
IgM (Immunoglobulin M), Srm: 61 mg/dL (ref 26–217)

## 2017-10-18 NOTE — Progress Notes (Signed)
[facility]  [phone]) [phone] [phone]) [phone]  ID: Cynthia Wallace OB: 1936-02-16  MR#: [id number]  MCN#:[id number]  Patient Care Team: Idelle Crouch, MD as PCP - General (Internal Medicine)  CHIEF COMPLAINT: Thrombocytopenia, MGUS.  INTERVAL HISTORY: Patient returns to clinic today for repeat laboratory work and further evaluation.  She currently feels well and is asymptomatic.  She does not complain of weakness or fatigue today. She denies any easy bleeding or bruising.  She has no neurologic complaints. She denies any recent fevers or illnesses. She has a good appetite and denies weight loss. She has no chest pain or shortness of breath. She denies any nausea, vomiting, constipation, or diarrhea. She has no urinary complaint. Patient offers no specific complaints today.   REVIEW OF SYSTEMS:   Review of Systems  Constitutional: Negative.  Negative for fever, malaise/fatigue and weight loss.  Respiratory: Negative.  Negative for cough and shortness of breath.   Cardiovascular: Negative.  Negative for chest pain and leg swelling.  Gastrointestinal: Negative.  Negative for abdominal pain, blood in stool and melena.  Genitourinary: Negative.  Negative for hematuria.  Musculoskeletal: Negative.   Skin: Negative.  Negative for rash.  Neurological: Negative.  Negative for sensory change and weakness.  Endo/Heme/Allergies: Negative.  Does not bruise/bleed easily.  Psychiatric/Behavioral: Negative.  The patient is not nervous/anxious.     As per HPI. Otherwise, a complete review of systems is negative.  PAST MEDICAL HISTORY: Past Medical History:  Diagnosis Date  . Acid reflux `  . Adenomatous colon polyp   . Anemia   . Arthritis   . Asthma   . Atrial fibrillation ([place])   . Bilateral leg edema   . Chickenpox   . Chronic a-fib ([place])   . Coronary artery disease   . Fibrocystic breast disease   . Hyperlipidemia   . Hypertension   . Iron deficiency  anemia   . Osteoporosis   . PUD (peptic ulcer disease)   . Sleep apnea   . Tricuspid valve insufficiency     PAST SURGICAL HISTORY: Past Surgical History:  Procedure Laterality Date  . ABDOMINAL HYSTERECTOMY    . BILATERAL KNEE ARTHROSCOPY    . BREAST EXCISIONAL BIOPSY Right 1990   NEG  . CARDIAC CATHETERIZATION    . INSERT / REPLACE / REMOVE PACEMAKER    . JOINT REPLACEMENT    . PACEMAKER INSERTION    . TUBAL LIGATION      FAMILY HISTORY: Family History  Problem Relation Age of Onset  . Hyperlipidemia Mother   . Hypertension Mother   . Heart disease Mother   . Heart disease Father   . Cancer Sister   . Breast cancer Sister   . Stroke Sister   . Hyperlipidemia Sister   . Heart disease Sister     ADVANCED DIRECTIVES (Y/N):  N  HEALTH MAINTENANCE: Social History   Tobacco Use  . Smoking status: Never Smoker  . Smokeless tobacco: Never Used  Substance Use Topics  . Alcohol use: No  . Drug use: No     Colonoscopy:  PAP:  Bone density:  Lipid panel:  Allergies  Allergen Reactions  . Codeine Nausea Only  . Penicillin G Other (See Comments)    As a child    Current Outpatient Medications  Medication Sig Dispense Refill  . atorvastatin (LIPITOR) 10 MG tablet Take 10 mg by mouth.    . B Complex Vitamins (VITAMIN B COMPLEX PO) Take by mouth.    Marland Kitchen  Cholecalciferol (VITAMIN D-1000 MAX ST) 1000 units tablet Take by mouth.    . digoxin (LANOXIN) 0.125 MG tablet Take 0.125 mg by mouth daily.     . furosemide (LASIX) 20 MG tablet Take 40 mg by mouth daily. Pt to take 2 tablets once a day     . gabapentin (NEURONTIN) 100 MG capsule Take 100 mg by mouth 3 (three) times daily.    . hydrOXYzine (ATARAX/VISTARIL) 10 MG tablet Take 1 tablet by mouth 3 (three) times daily.    Marland Kitchen losartan (COZAAR) 50 MG tablet Take 50 mg by mouth daily.    . Melatonin 10 MG TABS Take 1 tablet by mouth at bedtime as needed. Takes at night     . metoprolol succinate (TOPROL-XL) 50 MG 24 hr  tablet Take 50 mg by mouth 2 (two) times daily.    . montelukast (SINGULAIR) 10 MG tablet Take 10 mg by mouth at bedtime.    . potassium chloride (K-DUR) 10 MEQ tablet Take 10 mEq by mouth 3 (three) times daily.     . rivaroxaban (XARELTO) 20 MG TABS tablet Take 20 mg by mouth daily with supper.    . traZODone (DESYREL) 50 MG tablet Take 50 mg by mouth at bedtime.    . triamcinolone cream (KENALOG) 0.5 % Apply 1 application topically 2 (two) times daily as needed.    . vitamin B-12 (CYANOCOBALAMIN) 1000 MCG tablet Take by mouth.     No current facility-administered medications for this visit.     OBJECTIVE: Vitals:   10/19/17 1344  BP: (!) 162/94  Pulse: 82  Resp: 20  Temp: 98.8 F (37.1 C)     Body mass index is 36.64 kg/m.    ECOG FS:0 - Asymptomatic  General: Well-developed, well-nourished, no acute distress. Eyes: Pink conjunctiva, anicteric sclera. Lungs: Clear to auscultation bilaterally. Heart: Regular rate and rhythm. No rubs, murmurs, or gallops. Abdomen: Soft, nontender, nondistended. No organomegaly noted, normoactive bowel sounds. Musculoskeletal: No edema, cyanosis, or clubbing. Neuro: Alert, answering all questions appropriately. Cranial nerves grossly intact. Skin: No rashes or petechiae noted. Psych: Normal affect.  LAB RESULTS:  Lab Results  Component Value Date   NA 137 04/06/2017   K 3.7 04/06/2017   CL 104 04/06/2017   CO2 26 04/06/2017   GLUCOSE 85 04/06/2017   BUN 17 04/06/2017   CREATININE 0.82 04/06/2017   CALCIUM 9.1 04/06/2017   GFRNONAA >60 04/06/2017   GFRAA >60 04/06/2017    Lab Results  Component Value Date   WBC 5.3 04/06/2017   NEUTROABS 2.9 09/15/2016   HGB 10.4 (L) 04/06/2017   HCT 31.1 (L) 04/06/2017   MCV 93.9 04/06/2017   PLT 145 (L) 04/06/2017   Lab Results  Component Value Date   TOTALPROTELP 7.6 10/08/2017   ALBUMINELP 3.5 10/08/2017   A1GS 0.3 10/08/2017   A2GS 0.7 10/08/2017   BETS 0.8 10/08/2017   GAMS 2.3 (H)  10/08/2017   MSPIKE 1.8 (H) 10/08/2017   SPEI Comment 10/08/2017     STUDIES: No results found.  ASSESSMENT: Thrombocytopenia, MGUS.  PLAN:    1. MGUS: Patient's M spike has slightly trended up and is now 1.8, but this is unchanged from April 2018. She has a mild thrombocytopenia and anemia, but no other evidence of endorgan damage. See addendum below regarding metastatic bone survey and bone marrow biopsy. Given her multiple comorbidities, if patient ever proceeded to overt multiple myeloma treatment would be difficult.  No intervention is needed. Return  to clinic in 6 months with repeat laboratory work and further evaluation.  2. Thrombocytopenia: Mild. The remainder of her laboratory work other than her elevated M spike is either negative or within normal limits. No intervention is needed at this time. Return to clinic as above.  3. Anemia: Mild.  Iron stores are within normal limits.  Follow-up as above.   Patient expressed understanding and was in agreement with this plan. She also understands that She can call clinic at any time with any questions, concerns, or complaints.    Lloyd Huger, MD   10/20/2017 10:51 AM   Addendum: Patient had a bone survey at an outside facility on February 12, 2017 that did not reveal any evidence of lytic lesions. She had a bone marrow biopsy on February 23, 2017 which flow cytometry revealed 2% monoclonal plasma cells. Immunohistochemical stains revealed scattered focally small clusters of plasma cell approximate 6-13% of total bone marrow cells. These results are consistent with a diagnosis of MGUS.

## 2017-10-19 ENCOUNTER — Inpatient Hospital Stay: Payer: PPO | Admitting: Oncology

## 2017-10-19 VITALS — BP 162/94 | HR 82 | Temp 98.8°F | Resp 20 | Wt 227.0 lb

## 2017-10-19 DIAGNOSIS — D472 Monoclonal gammopathy: Secondary | ICD-10-CM | POA: Diagnosis not present

## 2017-10-19 DIAGNOSIS — Z7901 Long term (current) use of anticoagulants: Secondary | ICD-10-CM | POA: Diagnosis not present

## 2017-10-19 DIAGNOSIS — I482 Chronic atrial fibrillation: Secondary | ICD-10-CM | POA: Diagnosis not present

## 2017-10-19 DIAGNOSIS — M1990 Unspecified osteoarthritis, unspecified site: Secondary | ICD-10-CM

## 2017-10-19 DIAGNOSIS — D696 Thrombocytopenia, unspecified: Secondary | ICD-10-CM | POA: Diagnosis not present

## 2017-10-19 DIAGNOSIS — E785 Hyperlipidemia, unspecified: Secondary | ICD-10-CM

## 2017-10-19 DIAGNOSIS — D649 Anemia, unspecified: Secondary | ICD-10-CM | POA: Diagnosis not present

## 2017-10-19 DIAGNOSIS — I10 Essential (primary) hypertension: Secondary | ICD-10-CM | POA: Diagnosis not present

## 2017-10-19 DIAGNOSIS — I2510 Atherosclerotic heart disease of native coronary artery without angina pectoris: Secondary | ICD-10-CM

## 2017-10-19 DIAGNOSIS — Z803 Family history of malignant neoplasm of breast: Secondary | ICD-10-CM

## 2017-10-19 DIAGNOSIS — N6019 Diffuse cystic mastopathy of unspecified breast: Secondary | ICD-10-CM | POA: Diagnosis not present

## 2017-10-19 DIAGNOSIS — Z8711 Personal history of peptic ulcer disease: Secondary | ICD-10-CM

## 2017-10-19 DIAGNOSIS — K219 Gastro-esophageal reflux disease without esophagitis: Secondary | ICD-10-CM

## 2017-10-19 DIAGNOSIS — G4730 Sleep apnea, unspecified: Secondary | ICD-10-CM | POA: Diagnosis not present

## 2017-10-19 DIAGNOSIS — Z880 Allergy status to penicillin: Secondary | ICD-10-CM

## 2017-10-19 DIAGNOSIS — Z79899 Other long term (current) drug therapy: Secondary | ICD-10-CM

## 2017-10-19 DIAGNOSIS — Z950 Presence of cardiac pacemaker: Secondary | ICD-10-CM

## 2017-10-19 NOTE — Progress Notes (Signed)
Pt in for 6 month follow up.  Denies any concerns at this time.

## 2017-10-21 ENCOUNTER — Telehealth: Payer: Self-pay | Admitting: *Deleted

## 2017-10-21 DIAGNOSIS — J019 Acute sinusitis, unspecified: Secondary | ICD-10-CM | POA: Diagnosis not present

## 2017-10-21 DIAGNOSIS — R042 Hemoptysis: Secondary | ICD-10-CM | POA: Diagnosis not present

## 2017-10-21 DIAGNOSIS — R0982 Postnasal drip: Secondary | ICD-10-CM | POA: Diagnosis not present

## 2017-10-21 DIAGNOSIS — D696 Thrombocytopenia, unspecified: Secondary | ICD-10-CM

## 2017-10-21 DIAGNOSIS — R0989 Other specified symptoms and signs involving the circulatory and respiratory systems: Secondary | ICD-10-CM | POA: Diagnosis not present

## 2017-10-21 DIAGNOSIS — D472 Monoclonal gammopathy: Secondary | ICD-10-CM

## 2017-10-21 NOTE — Telephone Encounter (Signed)
Please have patient follow-up here in 4-6 weeks with lab only with CBC.

## 2017-10-21 NOTE — Telephone Encounter (Signed)
Claiborne Billings from [place] office at [facility] called reporting that patient was seen this morning for a sinus infection and labs reveal a drop in her HGB from 11 to 9.4  And her plt dropped from 157 to 128 in a 3 week period He has started her Augmentin and a Prednisone taper today and wandered if you want to see her this week. Please advise.

## 2017-10-21 NOTE — Telephone Encounter (Signed)
Patient accepted appointment for 11/16/17 @11 :15 AM for lab only

## 2017-10-26 DIAGNOSIS — R05 Cough: Secondary | ICD-10-CM | POA: Diagnosis not present

## 2017-10-27 DIAGNOSIS — C44319 Basal cell carcinoma of skin of other parts of face: Secondary | ICD-10-CM | POA: Diagnosis not present

## 2017-11-02 DIAGNOSIS — C44319 Basal cell carcinoma of skin of other parts of face: Secondary | ICD-10-CM | POA: Diagnosis not present

## 2017-11-03 DIAGNOSIS — J00 Acute nasopharyngitis [common cold]: Secondary | ICD-10-CM | POA: Diagnosis not present

## 2017-11-03 DIAGNOSIS — R0982 Postnasal drip: Secondary | ICD-10-CM | POA: Diagnosis not present

## 2017-11-05 ENCOUNTER — Other Ambulatory Visit: Payer: Self-pay | Admitting: Pharmacy Technician

## 2017-11-05 NOTE — Patient Outreach (Signed)
[facility]) Care Management  11/05/2017  Cynthia Wallace 16-Jul-1936 [id number]  Incoming [employer] Emmi all in reference to medication adherence. HIPAA identifiers verified and verbal consent received. The call was in reference to Atorvastatin 10 mg and the patient states she takes the medication daily as prescribed and does not miss any doses. While on the phone the patient had questions about a recent medication she was prescribed and side effects. Alwyn Ren, Rph was in the office and available to consult with the patient about her questions. She is going to follow-up with the patient later today after she gets more information.  Doreene Burke, [place] [phone]

## 2017-11-11 DIAGNOSIS — M25511 Pain in right shoulder: Secondary | ICD-10-CM | POA: Diagnosis not present

## 2017-11-11 DIAGNOSIS — R05 Cough: Secondary | ICD-10-CM | POA: Diagnosis not present

## 2017-11-11 DIAGNOSIS — Z79899 Other long term (current) drug therapy: Secondary | ICD-10-CM | POA: Diagnosis not present

## 2017-11-11 DIAGNOSIS — M25512 Pain in left shoulder: Secondary | ICD-10-CM | POA: Diagnosis not present

## 2017-11-11 DIAGNOSIS — R0602 Shortness of breath: Secondary | ICD-10-CM | POA: Diagnosis not present

## 2017-11-11 DIAGNOSIS — D538 Other specified nutritional anemias: Secondary | ICD-10-CM | POA: Diagnosis not present

## 2017-11-12 ENCOUNTER — Other Ambulatory Visit: Payer: Self-pay | Admitting: Internal Medicine

## 2017-11-12 DIAGNOSIS — R0602 Shortness of breath: Secondary | ICD-10-CM

## 2017-11-12 DIAGNOSIS — R05 Cough: Secondary | ICD-10-CM

## 2017-11-12 DIAGNOSIS — R053 Chronic cough: Secondary | ICD-10-CM

## 2017-11-16 ENCOUNTER — Inpatient Hospital Stay: Payer: PPO | Attending: Oncology

## 2017-11-16 DIAGNOSIS — D649 Anemia, unspecified: Secondary | ICD-10-CM | POA: Insufficient documentation

## 2017-11-16 DIAGNOSIS — D472 Monoclonal gammopathy: Secondary | ICD-10-CM | POA: Insufficient documentation

## 2017-11-16 DIAGNOSIS — D696 Thrombocytopenia, unspecified: Secondary | ICD-10-CM | POA: Insufficient documentation

## 2017-11-23 ENCOUNTER — Inpatient Hospital Stay: Payer: PPO

## 2017-11-23 DIAGNOSIS — D696 Thrombocytopenia, unspecified: Secondary | ICD-10-CM

## 2017-11-23 DIAGNOSIS — D649 Anemia, unspecified: Secondary | ICD-10-CM | POA: Diagnosis not present

## 2017-11-23 DIAGNOSIS — D472 Monoclonal gammopathy: Secondary | ICD-10-CM

## 2017-11-23 LAB — CBC WITH DIFFERENTIAL/PLATELET
Basophils Absolute: 0 10*3/uL (ref 0–0.1)
Basophils Relative: 1 %
Eosinophils Absolute: 0.2 10*3/uL (ref 0–0.7)
Eosinophils Relative: 3 %
HCT: 37.8 % (ref 35.0–47.0)
Hemoglobin: 12.4 g/dL (ref 12.0–16.0)
Lymphocytes Relative: 32 %
Lymphs Abs: 1.9 10*3/uL (ref 1.0–3.6)
MCH: 32 pg (ref 26.0–34.0)
MCHC: 32.9 g/dL (ref 32.0–36.0)
MCV: 97.1 fL (ref 80.0–100.0)
Monocytes Absolute: 0.6 10*3/uL (ref 0.2–0.9)
Monocytes Relative: 10 %
Neutro Abs: 3 10*3/uL (ref 1.4–6.5)
Neutrophils Relative %: 54 %
Platelets: 133 10*3/uL — ABNORMAL LOW (ref 150–440)
RBC: 3.89 MIL/uL (ref 3.80–5.20)
RDW: 15.3 % — ABNORMAL HIGH (ref 11.5–14.5)
WBC: 5.7 10*3/uL (ref 3.6–11.0)

## 2017-11-24 ENCOUNTER — Ambulatory Visit
Admission: RE | Admit: 2017-11-24 | Discharge: 2017-11-24 | Disposition: A | Discharge status: Home / Self Care | Payer: PPO | Source: Ambulatory Visit | Attending: Internal Medicine | Admitting: Internal Medicine

## 2017-11-24 DIAGNOSIS — R05 Cough: Secondary | ICD-10-CM | POA: Diagnosis not present

## 2017-11-24 DIAGNOSIS — R0602 Shortness of breath: Secondary | ICD-10-CM | POA: Insufficient documentation

## 2017-11-24 DIAGNOSIS — I2510 Atherosclerotic heart disease of native coronary artery without angina pectoris: Secondary | ICD-10-CM | POA: Insufficient documentation

## 2017-11-24 DIAGNOSIS — I517 Cardiomegaly: Secondary | ICD-10-CM | POA: Insufficient documentation

## 2017-11-24 DIAGNOSIS — R0609 Other forms of dyspnea: Secondary | ICD-10-CM | POA: Diagnosis not present

## 2017-11-24 DIAGNOSIS — R053 Chronic cough: Secondary | ICD-10-CM

## 2017-11-24 DIAGNOSIS — I700 Atherosclerosis of aorta: Secondary | ICD-10-CM | POA: Diagnosis not present

## 2017-11-24 DIAGNOSIS — G4733 Obstructive sleep apnea (adult) (pediatric): Secondary | ICD-10-CM | POA: Diagnosis not present

## 2017-11-24 DIAGNOSIS — R918 Other nonspecific abnormal finding of lung field: Secondary | ICD-10-CM | POA: Insufficient documentation

## 2017-11-24 DIAGNOSIS — J9811 Atelectasis: Secondary | ICD-10-CM | POA: Insufficient documentation

## 2017-12-15 DIAGNOSIS — I495 Sick sinus syndrome: Secondary | ICD-10-CM | POA: Diagnosis not present

## 2017-12-31 DIAGNOSIS — I482 Chronic atrial fibrillation: Secondary | ICD-10-CM | POA: Diagnosis not present

## 2017-12-31 DIAGNOSIS — Z79899 Other long term (current) drug therapy: Secondary | ICD-10-CM | POA: Diagnosis not present

## 2017-12-31 DIAGNOSIS — I495 Sick sinus syndrome: Secondary | ICD-10-CM | POA: Diagnosis not present

## 2017-12-31 DIAGNOSIS — Z9989 Dependence on other enabling machines and devices: Secondary | ICD-10-CM | POA: Diagnosis not present

## 2017-12-31 DIAGNOSIS — G4733 Obstructive sleep apnea (adult) (pediatric): Secondary | ICD-10-CM | POA: Diagnosis not present

## 2017-12-31 DIAGNOSIS — I10 Essential (primary) hypertension: Secondary | ICD-10-CM | POA: Diagnosis not present

## 2018-03-09 DIAGNOSIS — H43821 Vitreomacular adhesion, right eye: Secondary | ICD-10-CM | POA: Diagnosis not present

## 2018-03-10 DIAGNOSIS — I2510 Atherosclerotic heart disease of native coronary artery without angina pectoris: Secondary | ICD-10-CM | POA: Diagnosis not present

## 2018-03-10 DIAGNOSIS — Z9989 Dependence on other enabling machines and devices: Secondary | ICD-10-CM | POA: Diagnosis not present

## 2018-03-10 DIAGNOSIS — Z0000 Encounter for general adult medical examination without abnormal findings: Secondary | ICD-10-CM | POA: Diagnosis not present

## 2018-03-10 DIAGNOSIS — Z79899 Other long term (current) drug therapy: Secondary | ICD-10-CM | POA: Diagnosis not present

## 2018-03-10 DIAGNOSIS — E782 Mixed hyperlipidemia: Secondary | ICD-10-CM | POA: Diagnosis not present

## 2018-03-10 DIAGNOSIS — I10 Essential (primary) hypertension: Secondary | ICD-10-CM | POA: Diagnosis not present

## 2018-03-10 DIAGNOSIS — G4733 Obstructive sleep apnea (adult) (pediatric): Secondary | ICD-10-CM | POA: Diagnosis not present

## 2018-03-10 DIAGNOSIS — I482 Chronic atrial fibrillation: Secondary | ICD-10-CM | POA: Diagnosis not present

## 2018-03-10 DIAGNOSIS — I495 Sick sinus syndrome: Secondary | ICD-10-CM | POA: Diagnosis not present

## 2018-04-12 ENCOUNTER — Inpatient Hospital Stay: Payer: PPO | Attending: Oncology

## 2018-04-12 ENCOUNTER — Other Ambulatory Visit: Payer: Self-pay

## 2018-04-12 DIAGNOSIS — D649 Anemia, unspecified: Secondary | ICD-10-CM | POA: Insufficient documentation

## 2018-04-12 DIAGNOSIS — D472 Monoclonal gammopathy: Secondary | ICD-10-CM

## 2018-04-12 DIAGNOSIS — I10 Essential (primary) hypertension: Secondary | ICD-10-CM | POA: Diagnosis not present

## 2018-04-12 DIAGNOSIS — D696 Thrombocytopenia, unspecified: Secondary | ICD-10-CM

## 2018-04-12 LAB — CBC WITH DIFFERENTIAL/PLATELET
Basophils Absolute: 0 10*3/uL (ref 0–0.1)
Basophils Relative: 0 %
Eosinophils Absolute: 0.1 10*3/uL (ref 0–0.7)
Eosinophils Relative: 2 %
HCT: 31.1 % — ABNORMAL LOW (ref 35.0–47.0)
Hemoglobin: 10.5 g/dL — ABNORMAL LOW (ref 12.0–16.0)
Lymphocytes Relative: 29 %
Lymphs Abs: 1.5 10*3/uL (ref 1.0–3.6)
MCH: 32.6 pg (ref 26.0–34.0)
MCHC: 33.9 g/dL (ref 32.0–36.0)
MCV: 96.2 fL (ref 80.0–100.0)
Monocytes Absolute: 0.5 10*3/uL (ref 0.2–0.9)
Monocytes Relative: 10 %
Neutro Abs: 3 10*3/uL (ref 1.4–6.5)
Neutrophils Relative %: 59 %
Platelets: 147 10*3/uL — ABNORMAL LOW (ref 150–440)
RBC: 3.23 MIL/uL — ABNORMAL LOW (ref 3.80–5.20)
RDW: 14.3 % (ref 11.5–14.5)
WBC: 5.2 10*3/uL (ref 3.6–11.0)

## 2018-04-12 LAB — BASIC METABOLIC PANEL
Anion gap: 7 (ref 5–15)
BUN: 19 mg/dL (ref 6–20)
CO2: 25 mmol/L (ref 22–32)
Calcium: 9.2 mg/dL (ref 8.9–10.3)
Chloride: 105 mmol/L (ref 101–111)
Creatinine, Ser: 0.8 mg/dL (ref 0.44–1.00)
GFR calc Af Amer: >60 mL/min (ref >60)
GFR calc non Af Amer: >60 mL/min (ref >60)
Glucose, Bld: 86 mg/dL (ref 65–99)
Potassium: 4 mmol/L (ref 3.5–5.1)
Sodium: 137 mmol/L (ref 135–145)

## 2018-04-13 LAB — IGG, IGA, IGM
IgA: 133 mg/dL (ref 64–422)
IgG (Immunoglobin G), Serum: 2857 mg/dL — ABNORMAL HIGH (ref 700–1600)
IgM (Immunoglobulin M), Srm: 60 mg/dL (ref 26–217)

## 2018-04-13 LAB — KAPPA/LAMBDA LIGHT CHAINS
Kappa free light chain: 74.8 mg/L — ABNORMAL HIGH (ref 3.3–19.4)
Kappa, lambda light chain ratio: 4.37 — ABNORMAL HIGH (ref 0.26–1.65)
Lambda free light chains: 17.1 mg/L (ref 5.7–26.3)

## 2018-04-14 LAB — PROTEIN ELECTROPHORESIS, SERUM
A/G Ratio: 0.9 (ref 0.7–1.7)
Albumin ELP: 3.7 g/dL (ref 2.9–4.4)
Alpha-1-Globulin: 0.3 g/dL (ref 0.0–0.4)
Alpha-2-Globulin: 0.8 g/dL (ref 0.4–1.0)
Beta Globulin: 0.9 g/dL (ref 0.7–1.3)
Gamma Globulin: 2.2 g/dL — ABNORMAL HIGH (ref 0.4–1.8)
Globulin, Total: 4.3 g/dL — ABNORMAL HIGH (ref 2.2–3.9)
M-Spike, %: 2 g/dL — ABNORMAL HIGH
Total Protein ELP: 8 g/dL (ref 6.0–8.5)

## 2018-04-18 NOTE — Progress Notes (Signed)
[facility]  [phone] [phone]  ID: Cynthia Wallace OB: 01/26/1936  MR#: [id number]  CSN#:[id number]  Patient Care Team: Sparks, Jeffrey D, MD as PCP - General (Internal Medicine)  CHIEF COMPLAINT: Thrombocytopenia, MGUS.  INTERVAL HISTORY: Patient returns to clinic today for repeat laboratory work and routine six-month evaluation.  She continues to feel well and remains asymptomatic.  She does not complain of weakness or fatigue today. She denies any easy bleeding or bruising.  She has no neurologic complaints. She denies any recent fevers or illnesses. She has a good appetite and denies weight loss. She has no chest pain or shortness of breath. She denies any nausea, vomiting, constipation, or diarrhea. She has no urinary complaint.  Patient feels at her baseline offers no specific complaints today.  REVIEW OF SYSTEMS:   Review of Systems  Constitutional: Negative.  Negative for fever, malaise/fatigue and weight loss.  Respiratory: Negative.  Negative for cough and shortness of breath.   Cardiovascular: Negative.  Negative for chest pain and leg swelling.  Gastrointestinal: Negative.  Negative for abdominal pain, blood in stool and melena.  Genitourinary: Negative.  Negative for hematuria.  Musculoskeletal: Negative.   Skin: Negative.  Negative for rash.  Neurological: Negative.  Negative for sensory change, focal weakness and weakness.  Endo/Heme/Allergies: Negative.  Does not bruise/bleed easily.  Psychiatric/Behavioral: Negative.  The patient is not nervous/anxious.     As per HPI. Otherwise, a complete review of systems is negative.  PAST MEDICAL HISTORY: Past Medical History:  Diagnosis Date  . Acid reflux `  . Adenomatous colon polyp   . Anemia   . Arthritis   . Asthma   . Atrial fibrillation ([facility])   . Bilateral leg edema   . Chickenpox   . Chronic a-fib ([facility])   . Coronary artery disease   . Fibrocystic breast disease   .  Hyperlipidemia   . Hypertension   . Iron deficiency anemia   . Osteoporosis   . PUD (peptic ulcer disease)   . Sleep apnea   . Tricuspid valve insufficiency     PAST SURGICAL HISTORY: Past Surgical History:  Procedure Laterality Date  . ABDOMINAL HYSTERECTOMY    . BILATERAL KNEE ARTHROSCOPY    . BREAST EXCISIONAL BIOPSY Right 1990   NEG  . CARDIAC CATHETERIZATION    . COLONOSCOPY WITH PROPOFOL N/A 08/27/2016   Procedure: COLONOSCOPY WITH PROPOFOL;  Surgeon: Robert T Elliott, MD;  Location: [facility] ENDOSCOPY;  Service: Endoscopy;  Laterality: N/A;  . INSERT / REPLACE / REMOVE PACEMAKER    . JOINT REPLACEMENT    . PACEMAKER INSERTION    . TUBAL LIGATION      FAMILY HISTORY: Family History  Problem Relation Age of Onset  . Hyperlipidemia Mother   . Hypertension Mother   . Heart disease Mother   . Heart disease Father   . Cancer Sister   . Breast cancer Sister   . Stroke Sister   . Hyperlipidemia Sister   . Heart disease Sister     ADVANCED DIRECTIVES (Y/N):  N  HEALTH MAINTENANCE: Social History   Tobacco Use  . Smoking status: Never Smoker  . Smokeless tobacco: Never Used  Substance Use Topics  . Alcohol use: No  . Drug use: No     Colonoscopy:  PAP:  Bone density:  Lipid panel:  Allergies  Allergen Reactions  . Codeine Nausea Only  . Penicillin G Other (See Comments)    As a child      Current Outpatient Medications  Medication Sig Dispense Refill  . atorvastatin (LIPITOR) 10 MG tablet Take 10 mg by mouth.    . B Complex Vitamins (VITAMIN B COMPLEX PO) Take by mouth.    . Cholecalciferol (VITAMIN D-1000 MAX ST) 1000 units tablet Take by mouth.    . digoxin (LANOXIN) 0.125 MG tablet Take 0.125 mg by mouth daily.     . furosemide (LASIX) 20 MG tablet Take 40 mg by mouth daily. Pt to take 2 tablets once a day     . gabapentin (NEURONTIN) 100 MG capsule Take 100 mg by mouth 3 (three) times daily.    . hydrOXYzine (ATARAX/VISTARIL) 10 MG tablet Take 1  tablet by mouth 3 (three) times daily.    Marland Kitchen losartan (COZAAR) 50 MG tablet Take 50 mg by mouth daily.    . Melatonin 10 MG TABS Take 1 tablet by mouth at bedtime as needed. Takes at night     . metoprolol succinate (TOPROL-XL) 50 MG 24 hr tablet Take 50 mg by mouth 2 (two) times daily.    . montelukast (SINGULAIR) 10 MG tablet Take 10 mg by mouth at bedtime.    . potassium chloride (K-DUR) 10 MEQ tablet Take 10 mEq by mouth 3 (three) times daily.     . rivaroxaban (XARELTO) 20 MG TABS tablet Take 20 mg by mouth daily with supper.    . traZODone (DESYREL) 50 MG tablet Take 50 mg by mouth at bedtime.    . vitamin B-12 (CYANOCOBALAMIN) 1000 MCG tablet Take by mouth.     No current facility-administered medications for this visit.     OBJECTIVE: Vitals:   04/19/18 1357  BP: 128/73  Pulse: 81  Resp: 18  Temp: (!) 97.1 F (36.2 C)  SpO2: 98%     Body mass index is 36.15 kg/m.    ECOG FS:0 - Asymptomatic  General: Well-developed, well-nourished, no acute distress. Eyes: Pink conjunctiva, anicteric sclera. Lungs: Clear to auscultation bilaterally. Heart: Regular rate and rhythm. No rubs, murmurs, or gallops. Abdomen: Soft, nontender, nondistended. No organomegaly noted, normoactive bowel sounds. Musculoskeletal: No edema, cyanosis, or clubbing. Neuro: Alert, answering all questions appropriately. Cranial nerves grossly intact. Skin: No rashes or petechiae noted. Psych: Normal affect.  LAB RESULTS:  Lab Results  Component Value Date   NA 137 04/12/2018   K 4.0 04/12/2018   CL 105 04/12/2018   CO2 25 04/12/2018   GLUCOSE 86 04/12/2018   BUN 19 04/12/2018   CREATININE 0.80 04/12/2018   CALCIUM 9.2 04/12/2018   GFRNONAA >60 04/12/2018   GFRAA >60 04/12/2018    Lab Results  Component Value Date   WBC 5.2 04/12/2018   NEUTROABS 3.0 04/12/2018   HGB 10.5 (L) 04/12/2018   HCT 31.1 (L) 04/12/2018   MCV 96.2 04/12/2018   PLT 147 (L) 04/12/2018   Lab Results  Component Value  Date   TOTALPROTELP 8.0 04/12/2018   ALBUMINELP 3.7 04/12/2018   A1GS 0.3 04/12/2018   A2GS 0.8 04/12/2018   BETS 0.9 04/12/2018   GAMS 2.2 (H) 04/12/2018   MSPIKE 2.0 (H) 04/12/2018   SPEI Comment 04/12/2018     STUDIES: No results found.  ASSESSMENT: Thrombocytopenia, MGUS.  PLAN:    1. MGUS: Patient's M spike is slightly trended up again is now 2.0, but essentially unchanged from previous.  She continues to have an elevated IgG level with normal IgM and IgA.  Her kappa free light chains are also trending up.  She has  a mild anemia and thrombocytopenia, but no significant evidence of endorgan damage.  See addendum below regarding patient's bone marrow biopsy.  See addendum below regarding metastatic bone survey and bone marrow biopsy. Given her multiple comorbidities, if patient ever proceeded to overt multiple myeloma treatment would be difficult.  No intervention is needed at this time.  Return to clinic in 6 months with repeat laboratory work and further evaluation. 2. Thrombocytopenia: Slightly improved.  No intervention needed.  See results of bone marrow biopsy below. 3.  Anemia: Patient's hemoglobin has trended down slightly, previously iron stores are within normal limits.  Continue to monitor.    Approximately 20 minutes was spent in discussion of which greater than 50% was consultation.   Patient expressed understanding and was in agreement with this plan. She also understands that She can call clinic at any time with any questions, concerns, or complaints.    Timothy J Finnegan, MD   04/19/2018 3:43 PM   Addendum: Patient had a bone survey at an outside facility on February 12, 2017 that did not reveal any evidence of lytic lesions. She had a bone marrow biopsy on February 23, 2017 which flow cytometry revealed 2% monoclonal plasma cells. Immunohistochemical stains revealed scattered focally small clusters of plasma cell approximate 6-13% of total bone marrow cells. These results  are consistent with a diagnosis of MGUS.  

## 2018-04-19 ENCOUNTER — Encounter: Payer: Self-pay | Admitting: Oncology

## 2018-04-19 ENCOUNTER — Inpatient Hospital Stay (HOSPITAL_BASED_OUTPATIENT_CLINIC_OR_DEPARTMENT_OTHER): Payer: PPO | Admitting: Oncology

## 2018-04-19 VITALS — BP 128/73 | HR 81 | Temp 97.1°F | Resp 18 | Ht 66.0 in | Wt 224.0 lb

## 2018-04-19 DIAGNOSIS — D649 Anemia, unspecified: Secondary | ICD-10-CM | POA: Diagnosis not present

## 2018-04-19 DIAGNOSIS — D696 Thrombocytopenia, unspecified: Secondary | ICD-10-CM

## 2018-04-19 DIAGNOSIS — D472 Monoclonal gammopathy: Secondary | ICD-10-CM

## 2018-04-19 DIAGNOSIS — I10 Essential (primary) hypertension: Secondary | ICD-10-CM

## 2018-04-19 NOTE — Progress Notes (Signed)
No new changes noted today 

## 2018-06-15 DIAGNOSIS — D538 Other specified nutritional anemias: Secondary | ICD-10-CM | POA: Diagnosis not present

## 2018-06-15 DIAGNOSIS — Z1231 Encounter for screening mammogram for malignant neoplasm of breast: Secondary | ICD-10-CM | POA: Diagnosis not present

## 2018-06-15 DIAGNOSIS — I10 Essential (primary) hypertension: Secondary | ICD-10-CM | POA: Diagnosis not present

## 2018-06-15 DIAGNOSIS — G4733 Obstructive sleep apnea (adult) (pediatric): Secondary | ICD-10-CM | POA: Diagnosis not present

## 2018-06-15 DIAGNOSIS — R600 Localized edema: Secondary | ICD-10-CM | POA: Diagnosis not present

## 2018-06-15 DIAGNOSIS — E782 Mixed hyperlipidemia: Secondary | ICD-10-CM | POA: Diagnosis not present

## 2018-06-15 DIAGNOSIS — Z79899 Other long term (current) drug therapy: Secondary | ICD-10-CM | POA: Diagnosis not present

## 2018-06-15 DIAGNOSIS — Z9989 Dependence on other enabling machines and devices: Secondary | ICD-10-CM | POA: Diagnosis not present

## 2018-06-15 DIAGNOSIS — I495 Sick sinus syndrome: Secondary | ICD-10-CM | POA: Diagnosis not present

## 2018-06-15 DIAGNOSIS — I482 Chronic atrial fibrillation: Secondary | ICD-10-CM | POA: Diagnosis not present

## 2018-06-21 ENCOUNTER — Other Ambulatory Visit: Payer: Self-pay | Admitting: Internal Medicine

## 2018-06-21 DIAGNOSIS — Z1231 Encounter for screening mammogram for malignant neoplasm of breast: Secondary | ICD-10-CM

## 2018-09-15 DIAGNOSIS — F5104 Psychophysiologic insomnia: Secondary | ICD-10-CM | POA: Diagnosis not present

## 2018-09-15 DIAGNOSIS — I482 Chronic atrial fibrillation, unspecified: Secondary | ICD-10-CM | POA: Diagnosis not present

## 2018-09-15 DIAGNOSIS — E782 Mixed hyperlipidemia: Secondary | ICD-10-CM | POA: Diagnosis not present

## 2018-09-15 DIAGNOSIS — Z79899 Other long term (current) drug therapy: Secondary | ICD-10-CM | POA: Diagnosis not present

## 2018-09-15 DIAGNOSIS — R0602 Shortness of breath: Secondary | ICD-10-CM | POA: Diagnosis not present

## 2018-09-15 DIAGNOSIS — I10 Essential (primary) hypertension: Secondary | ICD-10-CM | POA: Diagnosis not present

## 2018-09-15 DIAGNOSIS — I272 Pulmonary hypertension, unspecified: Secondary | ICD-10-CM | POA: Diagnosis not present

## 2018-09-22 ENCOUNTER — Ambulatory Visit
Admission: RE | Admit: 2018-09-22 | Discharge: 2018-09-22 | Disposition: A | Discharge status: Home / Self Care | Payer: PPO | Source: Ambulatory Visit | Attending: Internal Medicine | Admitting: Internal Medicine

## 2018-09-22 DIAGNOSIS — Z1231 Encounter for screening mammogram for malignant neoplasm of breast: Secondary | ICD-10-CM | POA: Insufficient documentation

## 2018-09-22 DIAGNOSIS — G4733 Obstructive sleep apnea (adult) (pediatric): Secondary | ICD-10-CM | POA: Diagnosis not present

## 2018-09-22 DIAGNOSIS — I071 Rheumatic tricuspid insufficiency: Secondary | ICD-10-CM | POA: Diagnosis not present

## 2018-09-22 DIAGNOSIS — E782 Mixed hyperlipidemia: Secondary | ICD-10-CM | POA: Diagnosis not present

## 2018-09-22 DIAGNOSIS — I10 Essential (primary) hypertension: Secondary | ICD-10-CM | POA: Diagnosis not present

## 2018-09-22 DIAGNOSIS — I2510 Atherosclerotic heart disease of native coronary artery without angina pectoris: Secondary | ICD-10-CM | POA: Diagnosis not present

## 2018-09-22 DIAGNOSIS — I272 Pulmonary hypertension, unspecified: Secondary | ICD-10-CM | POA: Diagnosis not present

## 2018-09-22 DIAGNOSIS — R0602 Shortness of breath: Secondary | ICD-10-CM | POA: Diagnosis not present

## 2018-09-22 DIAGNOSIS — R600 Localized edema: Secondary | ICD-10-CM | POA: Diagnosis not present

## 2018-09-22 DIAGNOSIS — I495 Sick sinus syndrome: Secondary | ICD-10-CM | POA: Diagnosis not present

## 2018-09-22 DIAGNOSIS — I482 Chronic atrial fibrillation, unspecified: Secondary | ICD-10-CM | POA: Diagnosis not present

## 2018-09-22 DIAGNOSIS — Z9989 Dependence on other enabling machines and devices: Secondary | ICD-10-CM | POA: Diagnosis not present

## 2018-09-29 DIAGNOSIS — I10 Essential (primary) hypertension: Secondary | ICD-10-CM | POA: Diagnosis not present

## 2018-09-29 DIAGNOSIS — D538 Other specified nutritional anemias: Secondary | ICD-10-CM | POA: Diagnosis not present

## 2018-09-29 DIAGNOSIS — R600 Localized edema: Secondary | ICD-10-CM | POA: Diagnosis not present

## 2018-10-05 DIAGNOSIS — D538 Other specified nutritional anemias: Secondary | ICD-10-CM | POA: Diagnosis not present

## 2018-10-07 DIAGNOSIS — I482 Chronic atrial fibrillation, unspecified: Secondary | ICD-10-CM | POA: Diagnosis not present

## 2018-10-07 DIAGNOSIS — I10 Essential (primary) hypertension: Secondary | ICD-10-CM | POA: Diagnosis not present

## 2018-10-07 DIAGNOSIS — R0602 Shortness of breath: Secondary | ICD-10-CM | POA: Diagnosis not present

## 2018-10-07 DIAGNOSIS — I2510 Atherosclerotic heart disease of native coronary artery without angina pectoris: Secondary | ICD-10-CM | POA: Diagnosis not present

## 2018-10-07 DIAGNOSIS — Z9989 Dependence on other enabling machines and devices: Secondary | ICD-10-CM | POA: Diagnosis not present

## 2018-10-07 DIAGNOSIS — I272 Pulmonary hypertension, unspecified: Secondary | ICD-10-CM | POA: Diagnosis not present

## 2018-10-07 DIAGNOSIS — I495 Sick sinus syndrome: Secondary | ICD-10-CM | POA: Diagnosis not present

## 2018-10-07 DIAGNOSIS — G4733 Obstructive sleep apnea (adult) (pediatric): Secondary | ICD-10-CM | POA: Diagnosis not present

## 2018-10-07 DIAGNOSIS — I071 Rheumatic tricuspid insufficiency: Secondary | ICD-10-CM | POA: Diagnosis not present

## 2018-10-13 ENCOUNTER — Inpatient Hospital Stay: Payer: PPO | Attending: Oncology

## 2018-10-13 ENCOUNTER — Other Ambulatory Visit: Payer: Self-pay

## 2018-10-13 DIAGNOSIS — Z950 Presence of cardiac pacemaker: Secondary | ICD-10-CM | POA: Diagnosis not present

## 2018-10-13 DIAGNOSIS — D649 Anemia, unspecified: Secondary | ICD-10-CM | POA: Insufficient documentation

## 2018-10-13 DIAGNOSIS — I10 Essential (primary) hypertension: Secondary | ICD-10-CM | POA: Diagnosis not present

## 2018-10-13 DIAGNOSIS — Z86010 Personal history of colonic polyps: Secondary | ICD-10-CM | POA: Insufficient documentation

## 2018-10-13 DIAGNOSIS — D696 Thrombocytopenia, unspecified: Secondary | ICD-10-CM | POA: Diagnosis not present

## 2018-10-13 DIAGNOSIS — I4891 Unspecified atrial fibrillation: Secondary | ICD-10-CM | POA: Insufficient documentation

## 2018-10-13 DIAGNOSIS — D472 Monoclonal gammopathy: Secondary | ICD-10-CM

## 2018-10-13 DIAGNOSIS — Z7901 Long term (current) use of anticoagulants: Secondary | ICD-10-CM | POA: Diagnosis not present

## 2018-10-13 DIAGNOSIS — Z79899 Other long term (current) drug therapy: Secondary | ICD-10-CM | POA: Diagnosis not present

## 2018-10-13 LAB — BASIC METABOLIC PANEL
Anion gap: 4 — ABNORMAL LOW (ref 5–15)
BUN: 25 mg/dL — ABNORMAL HIGH (ref 8–23)
CO2: 25 mmol/L (ref 22–32)
Calcium: 9.2 mg/dL (ref 8.9–10.3)
Chloride: 107 mmol/L (ref 98–111)
Creatinine, Ser: 0.95 mg/dL (ref 0.44–1.00)
GFR calc Af Amer: >60 mL/min (ref >60)
GFR calc non Af Amer: 55 mL/min — ABNORMAL LOW (ref >60)
Glucose, Bld: 89 mg/dL (ref 70–99)
Potassium: 4.1 mmol/L (ref 3.5–5.1)
Sodium: 136 mmol/L (ref 135–145)

## 2018-10-13 LAB — CBC WITH DIFFERENTIAL/PLATELET
Abs Immature Granulocytes: 0.02 10*3/uL (ref 0.00–0.07)
Basophils Absolute: 0 10*3/uL (ref 0.0–0.1)
Basophils Relative: 0 %
Eosinophils Absolute: 0.1 10*3/uL (ref 0.0–0.5)
Eosinophils Relative: 3 %
HCT: 29.4 % — ABNORMAL LOW (ref 36.0–46.0)
Hemoglobin: 8.8 g/dL — ABNORMAL LOW (ref 12.0–15.0)
Immature Granulocytes: 0 %
Lymphocytes Relative: 25 %
Lymphs Abs: 1.2 10*3/uL (ref 0.7–4.0)
MCH: 30.3 pg (ref 26.0–34.0)
MCHC: 29.9 g/dL — ABNORMAL LOW (ref 30.0–36.0)
MCV: 101.4 fL — ABNORMAL HIGH (ref 80.0–100.0)
Monocytes Absolute: 0.4 10*3/uL (ref 0.1–1.0)
Monocytes Relative: 8 %
Neutro Abs: 3 10*3/uL (ref 1.7–7.7)
Neutrophils Relative %: 64 %
Platelets: 121 10*3/uL — ABNORMAL LOW (ref 150–400)
RBC: 2.9 MIL/uL — ABNORMAL LOW (ref 3.87–5.11)
RDW: 15.5 % (ref 11.5–15.5)
WBC: 4.8 10*3/uL (ref 4.0–10.5)
nRBC: 0 % (ref 0.0–0.2)

## 2018-10-14 LAB — IGG, IGA, IGM
IgA: 129 mg/dL (ref 64–422)
IgG (Immunoglobin G), Serum: 3123 mg/dL — ABNORMAL HIGH (ref 700–1600)
IgM (Immunoglobulin M), Srm: 53 mg/dL (ref 26–217)

## 2018-10-14 LAB — KAPPA/LAMBDA LIGHT CHAINS
Kappa free light chain: 92.5 mg/L — ABNORMAL HIGH (ref 3.3–19.4)
Kappa, lambda light chain ratio: 5.26 — ABNORMAL HIGH (ref 0.26–1.65)
Lambda free light chains: 17.6 mg/L (ref 5.7–26.3)

## 2018-10-15 LAB — PROTEIN ELECTROPHORESIS, SERUM
A/G Ratio: 0.8 (ref 0.7–1.7)
Albumin ELP: 3.5 g/dL (ref 2.9–4.4)
Alpha-1-Globulin: 0.3 g/dL (ref 0.0–0.4)
Alpha-2-Globulin: 0.7 g/dL (ref 0.4–1.0)
Beta Globulin: 1 g/dL (ref 0.7–1.3)
Gamma Globulin: 2.6 g/dL — ABNORMAL HIGH (ref 0.4–1.8)
Globulin, Total: 4.5 g/dL — ABNORMAL HIGH (ref 2.2–3.9)
M-Spike, %: 2.1 g/dL — ABNORMAL HIGH
Total Protein ELP: 8 g/dL (ref 6.0–8.5)

## 2018-10-17 NOTE — Progress Notes (Signed)
**Note Cynthia-Identified via Obfuscation** Cynthia Wallace  [phone]) [phone] [phone]) [phone]  ID: [name] OB: 1936-04-18  MR#: [id number]  LYY#:[id number]  Patient Care Team: Idelle Crouch, MD as PCP - General (Internal Medicine)  CHIEF COMPLAINT: Thrombocytopenia, MGUS.  INTERVAL HISTORY: Patient returns to clinic today for discussion of laboratory work and routine evaluation.  She has noted some increased weakness and fatigue, but otherwise has felt well.  She has occasional dyspnea on exertion. She denies any easy bleeding or bruising.  She has no neurologic complaints. She denies any recent fevers or illnesses. She has a good appetite and denies weight loss. She has no chest pain or shortness of breath. She denies any nausea, vomiting, constipation, or diarrhea. She has no urinary complaint.  Patient offers no further specific complaints today.  REVIEW OF SYSTEMS:   Review of Systems  Constitutional: Positive for malaise/fatigue. Negative for fever and weight loss.  Respiratory: Positive for shortness of breath. Negative for cough.   Cardiovascular: Negative.  Negative for chest pain and leg swelling.  Gastrointestinal: Negative.  Negative for abdominal pain, blood in stool and melena.  Genitourinary: Negative.  Negative for hematuria.  Musculoskeletal: Negative.  Negative for back pain.  Skin: Negative.  Negative for rash.  Neurological: Positive for weakness. Negative for sensory change, focal weakness and headaches.  Endo/Heme/Allergies: Negative.  Does not bruise/bleed easily.  Psychiatric/Behavioral: Negative.  The patient is not nervous/anxious.     As per HPI. Otherwise, a complete review of systems is negative.  PAST MEDICAL HISTORY: Past Medical History:  Diagnosis Date  . Acid reflux `  . Adenomatous colon polyp   . Anemia   . Arthritis   . Asthma   . Atrial fibrillation ([place])   . Bilateral leg edema   . Chickenpox   . Chronic a-fib   . Coronary artery disease   .  Fibrocystic breast disease   . Hyperlipidemia   . Hypertension   . Iron deficiency anemia   . Osteoporosis   . PUD (peptic ulcer disease)   . Sleep apnea   . Tricuspid valve insufficiency     PAST SURGICAL HISTORY: Past Surgical History:  Procedure Laterality Date  . ABDOMINAL HYSTERECTOMY    . BILATERAL KNEE ARTHROSCOPY    . BREAST EXCISIONAL BIOPSY Right 1990   NEG  . CARDIAC CATHETERIZATION    . COLONOSCOPY WITH PROPOFOL N/A 08/27/2016   Procedure: COLONOSCOPY WITH PROPOFOL;  Surgeon: Manya Silvas, MD;  Location: [facility] ENDOSCOPY;  Service: Endoscopy;  Laterality: N/A;  . INSERT / REPLACE / REMOVE PACEMAKER    . JOINT REPLACEMENT    . PACEMAKER INSERTION    . TUBAL LIGATION      FAMILY HISTORY: Family History  Problem Relation Age of Onset  . Hyperlipidemia Mother   . Hypertension Mother   . Heart disease Mother   . Heart disease Father   . Cancer Sister   . Breast cancer Sister   . Stroke Sister   . Hyperlipidemia Sister   . Heart disease Sister     ADVANCED DIRECTIVES (Y/N):  N  HEALTH MAINTENANCE: Social History   Tobacco Use  . Smoking status: Never Smoker  . Smokeless tobacco: Never Used  Substance Use Topics  . Alcohol use: No  . Drug use: No     Colonoscopy:  PAP:  Bone density:  Lipid panel:  Allergies  Allergen Reactions  . Codeine Nausea Only  . Penicillin G Other (See Comments)    As a  child    Current Outpatient Medications  Medication Sig Dispense Refill  . albuterol (PROVENTIL) (2.5 MG/3ML) 0.083% nebulizer solution Inhale into the lungs every 4 (four) hours as needed.    Marland Kitchen atorvastatin (LIPITOR) 10 MG tablet Take 10 mg by mouth.    . B Complex Vitamins (VITAMIN B COMPLEX PO) Take by mouth.    . Cholecalciferol (VITAMIN D-1000 MAX ST) 1000 units tablet Take by mouth.    . digoxin (LANOXIN) 0.125 MG tablet Take 0.125 mg by mouth daily.     . fluticasone (FLONASE) 50 MCG/ACT nasal spray SHAKE LIQUID AND USE 2 SPRAYS IN EACH  NOSTRIL EVERY DAY AS NEEDED FOR RHINITIS OR ALLERGIES    . furosemide (LASIX) 20 MG tablet Take 40 mg by mouth daily. Pt to take 2 tablets once a day     . gabapentin (NEURONTIN) 100 MG capsule Take 100 mg by mouth 3 (three) times daily.    . hydrOXYzine (ATARAX/VISTARIL) 10 MG tablet Take 1 tablet by mouth 3 (three) times daily.    Marland Kitchen losartan (COZAAR) 50 MG tablet Take 50 mg by mouth daily.    . Melatonin 10 MG TABS Take 1 tablet by mouth at bedtime as needed. Takes at night     . metoprolol succinate (TOPROL-XL) 50 MG 24 hr tablet Take 50 mg by mouth 2 (two) times daily.    . montelukast (SINGULAIR) 10 MG tablet Take 10 mg by mouth at bedtime.    . pantoprazole (PROTONIX) 40 MG tablet Take 40 mg by mouth daily.    . potassium chloride (K-DUR) 10 MEQ tablet Take 10 mEq by mouth 3 (three) times daily.     . rivaroxaban (XARELTO) 20 MG TABS tablet Take 20 mg by mouth daily with supper.    . traZODone (DESYREL) 50 MG tablet Take 50 mg by mouth at bedtime.    . vitamin B-12 (CYANOCOBALAMIN) 1000 MCG tablet Take by mouth.     No current facility-administered medications for this visit.     OBJECTIVE: Vitals:   10/18/18 0958 10/18/18 1001  BP: 132/76   Pulse: 85   Resp: 18   Temp: (!) 97 F (36.1 C)   SpO2:  98%     Body mass index is 36.86 kg/m.    ECOG FS:0 - Asymptomatic  General: Well-developed, well-nourished, no acute distress. Eyes: Pink conjunctiva, anicteric sclera. HEENT: Normocephalic, moist mucous membranes. Lungs: Clear to auscultation bilaterally. Heart: Regular rate and rhythm. No rubs, murmurs, or gallops. Abdomen: Soft, nontender, nondistended. No organomegaly noted, normoactive bowel sounds. Musculoskeletal: No edema, cyanosis, or clubbing. Neuro: Alert, answering all questions appropriately. Cranial nerves grossly intact. Skin: No rashes or petechiae noted. Psych: Normal affect.  LAB RESULTS:  Lab Results  Component Value Date   NA 136 10/13/2018   K 4.1  10/13/2018   CL 107 10/13/2018   CO2 25 10/13/2018   GLUCOSE 89 10/13/2018   BUN 25 (H) 10/13/2018   CREATININE 0.95 10/13/2018   CALCIUM 9.2 10/13/2018   GFRNONAA 55 (L) 10/13/2018   GFRAA >60 10/13/2018    Lab Results  Component Value Date   WBC 4.8 10/13/2018   NEUTROABS 3.0 10/13/2018   HGB 8.8 (L) 10/13/2018   HCT 29.4 (L) 10/13/2018   MCV 101.4 (H) 10/13/2018   PLT 121 (L) 10/13/2018   Lab Results  Component Value Date   TOTALPROTELP 8.0 10/13/2018   ALBUMINELP 3.5 10/13/2018   A1GS 0.3 10/13/2018   A2GS 0.7 10/13/2018  BETS 1.0 10/13/2018   GAMS 2.6 (H) 10/13/2018   MSPIKE 2.1 (H) 10/13/2018   SPEI Comment 10/13/2018     STUDIES: Mm 3d Screen Breast Bilateral  Result Date: 09/22/2018 CLINICAL DATA:  Screening. EXAM: DIGITAL SCREENING BILATERAL MAMMOGRAM WITH TOMO AND CAD COMPARISON:  Previous exam(s). ACR Breast Density Category c: The breast tissue is heterogeneously dense, which may obscure small masses. FINDINGS: There are no findings suspicious for malignancy. Images were processed with CAD. IMPRESSION: No mammographic evidence of malignancy. A result letter of this screening mammogram will be mailed directly to the patient. RECOMMENDATION: Screening mammogram in one year. (Code:SM-B-01Y) BI-RADS CATEGORY  1: Negative. Electronically Signed   By: Lillia Mountain M.D.   On: 09/22/2018 14:43    ASSESSMENT: Thrombocytopenia, MGUS.  PLAN:    1. MGUS: Patient's M spike is essentially unchanged at 2.1.  Her IgG immunoglobulins have trended up slightly to 3123 with a normal IgM and IgA.  She has worsening anemia and thrombocytopenia, but no other evidence of endorgan damage.  See addendum below the results of patient's most recent metastatic bone survey and bone marrow biopsy completed in 2018.  Given her multiple comorbidities, if patient ever proceeded to overt multiple myeloma treatment would be difficult.  No intervention is needed at this time.  Return to clinic in 3  months for laboratory work only and then in 6 months for laboratory work and further evaluation.   2. Thrombocytopenia: Patient's platelets have trended down slightly are now 121.  Monitor. 3.  Anemia: Patient's hemoglobin has trended down from 12.4 in December 2018 to 8.8 currently.  Iron stores, B12, folate at outside physician were all within normal limits.  No intervention is needed at this time.  Recheck laboratory work in 3 months as above.   Patient expressed understanding and was in agreement with this plan. She also understands that She can call clinic at any time with any questions, concerns, or complaints.    Lloyd Huger, MD   10/19/2018 6:45 AM   Addendum: Patient had a bone survey at an outside facility on February 12, 2017 that did not reveal any evidence of lytic lesions. She had a bone marrow biopsy on February 23, 2017 which flow cytometry revealed 2% monoclonal plasma cells. Immunohistochemical stains revealed scattered focally small clusters of plasma cell approximate 6-13% of total bone marrow cells. These results are consistent with a diagnosis of MGUS.

## 2018-10-18 ENCOUNTER — Inpatient Hospital Stay (HOSPITAL_BASED_OUTPATIENT_CLINIC_OR_DEPARTMENT_OTHER): Payer: PPO | Admitting: Oncology

## 2018-10-18 ENCOUNTER — Encounter: Payer: Self-pay | Admitting: Oncology

## 2018-10-18 ENCOUNTER — Other Ambulatory Visit: Payer: Self-pay

## 2018-10-18 VITALS — BP 132/76 | HR 85 | Temp 97.0°F | Resp 18 | Wt 228.4 lb

## 2018-10-18 DIAGNOSIS — Z950 Presence of cardiac pacemaker: Secondary | ICD-10-CM | POA: Diagnosis not present

## 2018-10-18 DIAGNOSIS — D696 Thrombocytopenia, unspecified: Secondary | ICD-10-CM

## 2018-10-18 DIAGNOSIS — D649 Anemia, unspecified: Secondary | ICD-10-CM

## 2018-10-18 DIAGNOSIS — Z7901 Long term (current) use of anticoagulants: Secondary | ICD-10-CM

## 2018-10-18 DIAGNOSIS — D472 Monoclonal gammopathy: Secondary | ICD-10-CM

## 2018-10-18 DIAGNOSIS — Z79899 Other long term (current) drug therapy: Secondary | ICD-10-CM

## 2018-10-18 DIAGNOSIS — Z86010 Personal history of colonic polyps: Secondary | ICD-10-CM

## 2018-10-18 DIAGNOSIS — I10 Essential (primary) hypertension: Secondary | ICD-10-CM | POA: Diagnosis not present

## 2018-10-18 DIAGNOSIS — I4891 Unspecified atrial fibrillation: Secondary | ICD-10-CM

## 2018-10-20 ENCOUNTER — Ambulatory Visit: Payer: PPO | Admitting: Oncology

## 2019-01-06 DIAGNOSIS — I482 Chronic atrial fibrillation, unspecified: Secondary | ICD-10-CM | POA: Diagnosis not present

## 2019-01-06 DIAGNOSIS — R42 Dizziness and giddiness: Secondary | ICD-10-CM | POA: Diagnosis not present

## 2019-01-06 DIAGNOSIS — I10 Essential (primary) hypertension: Secondary | ICD-10-CM | POA: Diagnosis not present

## 2019-01-06 DIAGNOSIS — Z9989 Dependence on other enabling machines and devices: Secondary | ICD-10-CM | POA: Diagnosis not present

## 2019-01-06 DIAGNOSIS — Z0000 Encounter for general adult medical examination without abnormal findings: Secondary | ICD-10-CM | POA: Diagnosis not present

## 2019-01-06 DIAGNOSIS — G4733 Obstructive sleep apnea (adult) (pediatric): Secondary | ICD-10-CM | POA: Diagnosis not present

## 2019-01-06 DIAGNOSIS — Z79899 Other long term (current) drug therapy: Secondary | ICD-10-CM | POA: Diagnosis not present

## 2019-01-06 DIAGNOSIS — D538 Other specified nutritional anemias: Secondary | ICD-10-CM | POA: Diagnosis not present

## 2019-01-06 DIAGNOSIS — E782 Mixed hyperlipidemia: Secondary | ICD-10-CM | POA: Diagnosis not present

## 2019-01-11 DIAGNOSIS — R42 Dizziness and giddiness: Secondary | ICD-10-CM | POA: Diagnosis not present

## 2019-01-11 DIAGNOSIS — I6523 Occlusion and stenosis of bilateral carotid arteries: Secondary | ICD-10-CM | POA: Diagnosis not present

## 2019-01-18 ENCOUNTER — Inpatient Hospital Stay: Payer: PPO | Attending: Oncology

## 2019-01-18 DIAGNOSIS — D472 Monoclonal gammopathy: Secondary | ICD-10-CM

## 2019-01-18 DIAGNOSIS — D649 Anemia, unspecified: Secondary | ICD-10-CM | POA: Diagnosis not present

## 2019-01-18 DIAGNOSIS — D696 Thrombocytopenia, unspecified: Secondary | ICD-10-CM | POA: Diagnosis not present

## 2019-01-18 LAB — CBC WITH DIFFERENTIAL/PLATELET
Abs Immature Granulocytes: 0.01 10*3/uL (ref 0.00–0.07)
Basophils Absolute: 0 10*3/uL (ref 0.0–0.1)
Basophils Relative: 0 %
Eosinophils Absolute: 0.1 10*3/uL (ref 0.0–0.5)
Eosinophils Relative: 2 %
HCT: 29.5 % — ABNORMAL LOW (ref 36.0–46.0)
Hemoglobin: 9.1 g/dL — ABNORMAL LOW (ref 12.0–15.0)
Immature Granulocytes: 0 %
Lymphocytes Relative: 25 %
Lymphs Abs: 1.1 10*3/uL (ref 0.7–4.0)
MCH: 31.4 pg (ref 26.0–34.0)
MCHC: 30.8 g/dL (ref 30.0–36.0)
MCV: 101.7 fL — ABNORMAL HIGH (ref 80.0–100.0)
Monocytes Absolute: 0.4 10*3/uL (ref 0.1–1.0)
Monocytes Relative: 9 %
Neutro Abs: 2.8 10*3/uL (ref 1.7–7.7)
Neutrophils Relative %: 64 %
Platelets: 107 10*3/uL — ABNORMAL LOW (ref 150–400)
RBC: 2.9 MIL/uL — ABNORMAL LOW (ref 3.87–5.11)
RDW: 15.4 % (ref 11.5–15.5)
WBC: 4.5 10*3/uL (ref 4.0–10.5)
nRBC: 0 % (ref 0.0–0.2)

## 2019-01-18 LAB — LACTATE DEHYDROGENASE: LDH: 115 U/L (ref 98–192)

## 2019-01-18 LAB — RETICULOCYTES
Immature Retic Fract: 15 % (ref 2.3–15.9)
RBC.: 2.9 MIL/uL — ABNORMAL LOW (ref 3.87–5.11)
Retic Count, Absolute: 47 10*3/uL (ref 19.0–186.0)
Retic Ct Pct: 1.6 % (ref 0.4–3.1)

## 2019-01-18 LAB — BASIC METABOLIC PANEL
Anion gap: 2 — ABNORMAL LOW (ref 5–15)
BUN: 20 mg/dL (ref 8–23)
CO2: 28 mmol/L (ref 22–32)
Calcium: 8.9 mg/dL (ref 8.9–10.3)
Chloride: 108 mmol/L (ref 98–111)
Creatinine, Ser: 0.83 mg/dL (ref 0.44–1.00)
GFR calc Af Amer: >60 mL/min (ref >60)
GFR calc non Af Amer: >60 mL/min (ref >60)
Glucose, Bld: 81 mg/dL (ref 70–99)
Potassium: 4.1 mmol/L (ref 3.5–5.1)
Sodium: 138 mmol/L (ref 135–145)

## 2019-01-19 LAB — PROTEIN ELECTROPHORESIS, SERUM
A/G Ratio: 0.9 (ref 0.7–1.7)
Albumin ELP: 3.5 g/dL (ref 2.9–4.4)
Alpha-1-Globulin: 0.3 g/dL (ref 0.0–0.4)
Alpha-2-Globulin: 0.8 g/dL (ref 0.4–1.0)
Beta Globulin: 0.8 g/dL (ref 0.7–1.3)
Gamma Globulin: 2.1 g/dL — ABNORMAL HIGH (ref 0.4–1.8)
Globulin, Total: 3.9 g/dL (ref 2.2–3.9)
M-Spike, %: 1.7 g/dL — ABNORMAL HIGH
Total Protein ELP: 7.4 g/dL (ref 6.0–8.5)

## 2019-01-19 LAB — IGG, IGA, IGM
IgA: 102 mg/dL (ref 64–422)
IgG (Immunoglobin G), Serum: 2807 mg/dL — ABNORMAL HIGH (ref 700–1600)
IgM (Immunoglobulin M), Srm: 50 mg/dL (ref 26–217)

## 2019-01-19 LAB — KAPPA/LAMBDA LIGHT CHAINS
Kappa free light chain: 70.1 mg/L — ABNORMAL HIGH (ref 3.3–19.4)
Kappa, lambda light chain ratio: 4.33 — ABNORMAL HIGH (ref 0.26–1.65)
Lambda free light chains: 16.2 mg/L (ref 5.7–26.3)

## 2019-01-19 LAB — HAPTOGLOBIN: Haptoglobin: 164 mg/dL (ref 42–346)

## 2019-04-05 DIAGNOSIS — I2510 Atherosclerotic heart disease of native coronary artery without angina pectoris: Secondary | ICD-10-CM | POA: Diagnosis not present

## 2019-04-05 DIAGNOSIS — I482 Chronic atrial fibrillation, unspecified: Secondary | ICD-10-CM | POA: Diagnosis not present

## 2019-04-05 DIAGNOSIS — E782 Mixed hyperlipidemia: Secondary | ICD-10-CM | POA: Diagnosis not present

## 2019-04-05 DIAGNOSIS — I10 Essential (primary) hypertension: Secondary | ICD-10-CM | POA: Diagnosis not present

## 2019-04-17 ENCOUNTER — Other Ambulatory Visit: Payer: Self-pay

## 2019-04-18 ENCOUNTER — Inpatient Hospital Stay: Payer: PPO | Attending: Oncology

## 2019-04-18 ENCOUNTER — Other Ambulatory Visit: Payer: Self-pay

## 2019-04-18 DIAGNOSIS — G4730 Sleep apnea, unspecified: Secondary | ICD-10-CM | POA: Insufficient documentation

## 2019-04-18 DIAGNOSIS — D472 Monoclonal gammopathy: Secondary | ICD-10-CM

## 2019-04-18 DIAGNOSIS — Z7951 Long term (current) use of inhaled steroids: Secondary | ICD-10-CM | POA: Diagnosis not present

## 2019-04-18 DIAGNOSIS — Z7901 Long term (current) use of anticoagulants: Secondary | ICD-10-CM | POA: Diagnosis not present

## 2019-04-18 DIAGNOSIS — J45909 Unspecified asthma, uncomplicated: Secondary | ICD-10-CM | POA: Diagnosis not present

## 2019-04-18 DIAGNOSIS — E785 Hyperlipidemia, unspecified: Secondary | ICD-10-CM | POA: Insufficient documentation

## 2019-04-18 DIAGNOSIS — Z950 Presence of cardiac pacemaker: Secondary | ICD-10-CM | POA: Insufficient documentation

## 2019-04-18 DIAGNOSIS — D649 Anemia, unspecified: Secondary | ICD-10-CM | POA: Insufficient documentation

## 2019-04-18 DIAGNOSIS — I10 Essential (primary) hypertension: Secondary | ICD-10-CM | POA: Insufficient documentation

## 2019-04-18 DIAGNOSIS — D696 Thrombocytopenia, unspecified: Secondary | ICD-10-CM | POA: Insufficient documentation

## 2019-04-18 DIAGNOSIS — Z79899 Other long term (current) drug therapy: Secondary | ICD-10-CM | POA: Insufficient documentation

## 2019-04-18 DIAGNOSIS — Z803 Family history of malignant neoplasm of breast: Secondary | ICD-10-CM | POA: Insufficient documentation

## 2019-04-18 DIAGNOSIS — I4891 Unspecified atrial fibrillation: Secondary | ICD-10-CM | POA: Diagnosis not present

## 2019-04-18 LAB — BASIC METABOLIC PANEL
Anion gap: 4 — ABNORMAL LOW (ref 5–15)
BUN: 20 mg/dL (ref 8–23)
CO2: 28 mmol/L (ref 22–32)
Calcium: 8.9 mg/dL (ref 8.9–10.3)
Chloride: 107 mmol/L (ref 98–111)
Creatinine, Ser: 0.91 mg/dL (ref 0.44–1.00)
GFR calc Af Amer: >60 mL/min (ref >60)
GFR calc non Af Amer: 59 mL/min — ABNORMAL LOW (ref >60)
Glucose, Bld: 82 mg/dL (ref 70–99)
Potassium: 3.9 mmol/L (ref 3.5–5.1)
Sodium: 139 mmol/L (ref 135–145)

## 2019-04-18 LAB — CBC WITH DIFFERENTIAL/PLATELET
Abs Immature Granulocytes: 0.02 10*3/uL (ref 0.00–0.07)
Basophils Absolute: 0 10*3/uL (ref 0.0–0.1)
Basophils Relative: 1 %
Eosinophils Absolute: 0.1 10*3/uL (ref 0.0–0.5)
Eosinophils Relative: 2 %
HCT: 30.8 % — ABNORMAL LOW (ref 36.0–46.0)
Hemoglobin: 9.6 g/dL — ABNORMAL LOW (ref 12.0–15.0)
Immature Granulocytes: 1 %
Lymphocytes Relative: 31 %
Lymphs Abs: 1.2 10*3/uL (ref 0.7–4.0)
MCH: 32.1 pg (ref 26.0–34.0)
MCHC: 31.2 g/dL (ref 30.0–36.0)
MCV: 103 fL — ABNORMAL HIGH (ref 80.0–100.0)
Monocytes Absolute: 0.4 10*3/uL (ref 0.1–1.0)
Monocytes Relative: 10 %
Neutro Abs: 2.2 10*3/uL (ref 1.7–7.7)
Neutrophils Relative %: 55 %
Platelets: 117 10*3/uL — ABNORMAL LOW (ref 150–400)
RBC: 2.99 MIL/uL — ABNORMAL LOW (ref 3.87–5.11)
RDW: 14.7 % (ref 11.5–15.5)
WBC: 4 10*3/uL (ref 4.0–10.5)
nRBC: 0 % (ref 0.0–0.2)

## 2019-04-19 LAB — PROTEIN ELECTROPHORESIS, SERUM
A/G Ratio: 0.9 (ref 0.7–1.7)
Albumin ELP: 3.5 g/dL (ref 2.9–4.4)
Alpha-1-Globulin: 0.2 g/dL (ref 0.0–0.4)
Alpha-2-Globulin: 0.8 g/dL (ref 0.4–1.0)
Beta Globulin: 0.8 g/dL (ref 0.7–1.3)
Gamma Globulin: 2.2 g/dL — ABNORMAL HIGH (ref 0.4–1.8)
Globulin, Total: 4.1 g/dL — ABNORMAL HIGH (ref 2.2–3.9)
M-Spike, %: 1.9 g/dL — ABNORMAL HIGH
Total Protein ELP: 7.6 g/dL (ref 6.0–8.5)

## 2019-04-19 LAB — IGG, IGA, IGM
IgA: 109 mg/dL (ref 64–422)
IgG (Immunoglobin G), Serum: 2934 mg/dL — ABNORMAL HIGH (ref 586–1602)
IgM (Immunoglobulin M), Srm: 48 mg/dL (ref 26–217)

## 2019-04-19 LAB — KAPPA/LAMBDA LIGHT CHAINS
Kappa free light chain: 62.4 mg/L — ABNORMAL HIGH (ref 3.3–19.4)
Kappa, lambda light chain ratio: 3.92 — ABNORMAL HIGH (ref 0.26–1.65)
Lambda free light chains: 15.9 mg/L (ref 5.7–26.3)

## 2019-04-20 ENCOUNTER — Telehealth: Payer: Self-pay | Admitting: Oncology

## 2019-04-20 DIAGNOSIS — Z9989 Dependence on other enabling machines and devices: Secondary | ICD-10-CM | POA: Diagnosis not present

## 2019-04-20 DIAGNOSIS — Z0000 Encounter for general adult medical examination without abnormal findings: Secondary | ICD-10-CM | POA: Diagnosis not present

## 2019-04-20 DIAGNOSIS — D538 Other specified nutritional anemias: Secondary | ICD-10-CM | POA: Diagnosis not present

## 2019-04-20 DIAGNOSIS — I10 Essential (primary) hypertension: Secondary | ICD-10-CM | POA: Diagnosis not present

## 2019-04-20 DIAGNOSIS — G4733 Obstructive sleep apnea (adult) (pediatric): Secondary | ICD-10-CM | POA: Diagnosis not present

## 2019-04-20 DIAGNOSIS — I482 Chronic atrial fibrillation, unspecified: Secondary | ICD-10-CM | POA: Diagnosis not present

## 2019-04-20 DIAGNOSIS — E782 Mixed hyperlipidemia: Secondary | ICD-10-CM | POA: Diagnosis not present

## 2019-04-20 DIAGNOSIS — R600 Localized edema: Secondary | ICD-10-CM | POA: Diagnosis not present

## 2019-04-20 NOTE — Telephone Encounter (Signed)
Left VM for pt to contact me to set-up Virtual Visit due to COVID 19. BF

## 2019-04-22 ENCOUNTER — Other Ambulatory Visit: Payer: Self-pay

## 2019-04-23 NOTE — Progress Notes (Signed)
[place]  [phone]) [phone] Fax:(336) [phone]  ID: Cynthia Wallace OB: [birth year]  MR#: [id number]  AST#:[id number]  Patient Care Team: Idelle Crouch, MD as PCP - General (Internal Medicine)  I connected with Cynthia Wallace on 04/27/19 at 11:00 AM EDT by video enabled telemedicine visit and verified that I am speaking with the correct person using two identifiers.   I discussed the limitations, risks, security and privacy concerns of performing an evaluation and management service by telemedicine and the availability of in-person appointments. I also discussed with the patient that there may be a patient responsible charge related to this service. The patient expressed understanding and agreed to proceed.   Other persons participating in the visit and their role in the encounter: Patient, MD  Patient's location: Home Provider's location: Clinic  CHIEF COMPLAINT: Thrombocytopenia, MGUS.  INTERVAL HISTORY: Patient agreed to video enabled telemedicine visit for discussion of her laboratory work and routine 54-monthevaluation.  She currently feels well and is asymptomatic. She has no neurologic complaints. She denies any recent fevers or illnesses. She has a good appetite and denies weight loss.  She denies any chest pain, shortness of breath, cough, or hemoptysis.  She denies any nausea, vomiting, constipation, or diarrhea. She has no urinary complaint.  Patient feels at her baseline offers no specific complaints today.  REVIEW OF SYSTEMS:   Review of Systems  Constitutional: Negative.  Negative for fever, malaise/fatigue and weight loss.  Respiratory: Negative.  Negative for cough and shortness of breath.   Cardiovascular: Negative.  Negative for chest pain and leg swelling.  Gastrointestinal: Negative.  Negative for abdominal pain, blood in stool and melena.  Genitourinary: Negative.  Negative for hematuria.  Musculoskeletal: Negative.  Negative for back  pain.  Skin: Negative.  Negative for rash.  Neurological: Negative.  Negative for sensory change, focal weakness, weakness and headaches.  Endo/Heme/Allergies: Negative.  Does not bruise/bleed easily.  Psychiatric/Behavioral: Negative.  The patient is not nervous/anxious.     As per HPI. Otherwise, a complete review of systems is negative.  PAST MEDICAL HISTORY: Past Medical History:  Diagnosis Date  . Acid reflux `  . Adenomatous colon polyp   . Anemia   . Arthritis   . Asthma   . [place]   . Bilateral leg edema   . Chickenpox   . Chronic a-fib   . Coronary artery disease   . Fibrocystic breast disease   . Hyperlipidemia   . Hypertension   . Iron deficiency anemia   . Osteoporosis   . PUD (peptic ulcer disease)   . Sleep apnea   . Tricuspid valve insufficiency     PAST SURGICAL HISTORY: Past Surgical History:  Procedure Laterality Date  . ABDOMINAL HYSTERECTOMY    . BILATERAL KNEE ARTHROSCOPY    . BREAST EXCISIONAL BIOPSY Right 1990   NEG  . CARDIAC CATHETERIZATION    . COLONOSCOPY WITH PROPOFOL N/A 08/27/2016   Procedure: COLONOSCOPY WITH PROPOFOL;  Surgeon: RManya Silvas MD;  [facility]  Service: Endoscopy;  Laterality: N/A;  . INSERT / REPLACE / REMOVE PACEMAKER    . JOINT REPLACEMENT    . PACEMAKER INSERTION    . TUBAL LIGATION      FAMILY HISTORY: Family History  Problem Relation Age of Onset  . Hyperlipidemia Mother   . Hypertension Mother   . Heart disease Mother   . Heart disease Father   . Cancer Sister   . Breast  cancer Sister   . Stroke Sister   . Hyperlipidemia Sister   . Heart disease Sister     ADVANCED DIRECTIVES (Y/N):  N  HEALTH MAINTENANCE: Social History   Tobacco Use  . Smoking status: Never Smoker  . Smokeless tobacco: Never Used  Substance Use Topics  . Alcohol use: No  . Drug use: No     Colonoscopy:  PAP:  Bone density:  Lipid panel:  Allergies  Allergen Reactions  . Codeine  Nausea Only  . Penicillin G Other (See Comments)    As a child    Current Outpatient Medications  Medication Sig Dispense Refill  . albuterol (PROVENTIL) (2.5 MG/3ML) 0.083% nebulizer solution Inhale into the lungs every 4 (four) hours as needed.    Marland Kitchen atorvastatin (LIPITOR) 10 MG tablet Take 10 mg by mouth.    . B Complex Vitamins (VITAMIN B COMPLEX PO) Take by mouth.    . Cholecalciferol (VITAMIN D-1000 MAX ST) 1000 units tablet Take by mouth.    . digoxin (LANOXIN) 0.125 MG tablet Take 0.125 mg by mouth daily.     . fluticasone (FLONASE) 50 MCG/ACT nasal spray SHAKE LIQUID AND USE 2 SPRAYS IN EACH NOSTRIL EVERY DAY AS NEEDED FOR RHINITIS OR ALLERGIES    . furosemide (LASIX) 20 MG tablet Take 40 mg by mouth daily. Pt to take 2 tablets once a day     . hydrOXYzine (ATARAX/VISTARIL) 10 MG tablet Take 1 tablet by mouth 3 (three) times daily.    Marland Kitchen losartan (COZAAR) 50 MG tablet Take 50 mg by mouth daily.    . Melatonin 10 MG TABS Take 1 tablet by mouth at bedtime as needed. Takes at night     . metoprolol succinate (TOPROL-XL) 50 MG 24 hr tablet Take 50 mg by mouth 2 (two) times daily.    . montelukast (SINGULAIR) 10 MG tablet Take 10 mg by mouth at bedtime.    . pantoprazole (PROTONIX) 40 MG tablet Take 40 mg by mouth daily.    . potassium chloride (K-DUR) 10 MEQ tablet Take 10 mEq by mouth 3 (three) times daily.     . rivaroxaban (XARELTO) 20 MG TABS tablet Take 20 mg by mouth daily with supper.    . traZODone (DESYREL) 50 MG tablet Take 50 mg by mouth at bedtime.    . vitamin B-12 (CYANOCOBALAMIN) 1000 MCG tablet Take by mouth.     No current facility-administered medications for this visit.     OBJECTIVE: There were no vitals filed for this visit.   There is no height or weight on file to calculate BMI.    ECOG FS:0 - Asymptomatic  General: Well-developed, well-nourished, no acute distress. HEENT: Normocephalic. Neuro: Alert, answering all questions appropriately. Cranial nerves  grossly intact. Skin: No rashes or petechiae noted. Psych: Normal affect.  LAB RESULTS:  Lab Results  Component Value Date   NA 139 04/18/2019   K 3.9 04/18/2019   CL 107 04/18/2019   CO2 28 04/18/2019   GLUCOSE 82 04/18/2019   BUN 20 04/18/2019   CREATININE 0.91 04/18/2019   CALCIUM 8.9 04/18/2019   GFRNONAA 59 (L) 04/18/2019   GFRAA >60 04/18/2019    Lab Results  Component Value Date   WBC 4.0 04/18/2019   NEUTROABS 2.2 04/18/2019   HGB 9.6 (L) 04/18/2019   HCT 30.8 (L) 04/18/2019   MCV 103.0 (H) 04/18/2019   PLT 117 (L) 04/18/2019   Lab Results  Component Value Date   TOTALPROTELP  7.6 04/18/2019   ALBUMINELP 3.5 04/18/2019   A1GS 0.2 04/18/2019   A2GS 0.8 04/18/2019   BETS 0.8 04/18/2019   GAMS 2.2 (H) 04/18/2019   MSPIKE 1.9 (H) 04/18/2019   SPEI Comment 04/18/2019     STUDIES: No results found.  ASSESSMENT: Thrombocytopenia, MGUS.  PLAN:    1. MGUS: Patient had a bone survey at an outside facility on February 12, 2017 that did not reveal any evidence of lytic lesions. She had a bone marrow biopsy on February 23, 2017 which flow cytometry revealed 2% monoclonal plasma cells. Immunohistochemical stains revealed scattered focally small clusters of plasma cell approximate 6-13% of total bone marrow cells. These results are consistent with a diagnosis of MGUS.  Her M spike is ranged from 1.7-2.1 since that time.  Today's result is 1.9.  She has a persistently elevated IgG level of 2934.  Her kappa free light chains have trended down and are now 62.4.  She has a chronic anemia and thrombocytopenia, but no other evidence of endorgan damage. Continue simple evaluation.  Return to clinic in 6 months with repeat laboratory work and further evaluation. 2. Thrombocytopenia: Chronic and unchanged.  Patient's platelet count is 117. 3.  Anemia: Patient's hemoglobin is decreased, but stable at 9.6.  Previously, iron stores, B12, folate at outside physician were all within normal  limits.  No intervention is needed at this time.    I provided 25 minutes of face-to-face video visit time during this encounter, and > 50% was spent counseling as documented under my assessment & plan.  Patient expressed understanding and was in agreement with this plan. She also understands that She can call clinic at any time with any questions, concerns, or complaints.    Lloyd Huger, MD   04/27/2019 7:20 AM   Addendum: Patient had a bone survey at an outside facility on February 12, 2017 that did not reveal any evidence of lytic lesions. She had a bone marrow biopsy on February 23, 2017 which flow cytometry revealed 2% monoclonal plasma cells. Immunohistochemical stains revealed scattered focally small clusters of plasma cell approximate 6-13% of total bone marrow cells. These results are consistent with a diagnosis of MGUS.

## 2019-04-25 ENCOUNTER — Other Ambulatory Visit: Payer: Self-pay

## 2019-04-25 ENCOUNTER — Inpatient Hospital Stay (HOSPITAL_BASED_OUTPATIENT_CLINIC_OR_DEPARTMENT_OTHER): Payer: PPO | Admitting: Oncology

## 2019-04-25 DIAGNOSIS — I10 Essential (primary) hypertension: Secondary | ICD-10-CM

## 2019-04-25 DIAGNOSIS — Z803 Family history of malignant neoplasm of breast: Secondary | ICD-10-CM

## 2019-04-25 DIAGNOSIS — D649 Anemia, unspecified: Secondary | ICD-10-CM

## 2019-04-25 DIAGNOSIS — E785 Hyperlipidemia, unspecified: Secondary | ICD-10-CM

## 2019-04-25 DIAGNOSIS — G4730 Sleep apnea, unspecified: Secondary | ICD-10-CM

## 2019-04-25 DIAGNOSIS — J45909 Unspecified asthma, uncomplicated: Secondary | ICD-10-CM

## 2019-04-25 DIAGNOSIS — I4891 Unspecified atrial fibrillation: Secondary | ICD-10-CM

## 2019-04-25 DIAGNOSIS — D472 Monoclonal gammopathy: Secondary | ICD-10-CM | POA: Diagnosis not present

## 2019-04-25 DIAGNOSIS — Z7901 Long term (current) use of anticoagulants: Secondary | ICD-10-CM

## 2019-04-25 DIAGNOSIS — Z79899 Other long term (current) drug therapy: Secondary | ICD-10-CM

## 2019-04-25 DIAGNOSIS — Z7951 Long term (current) use of inhaled steroids: Secondary | ICD-10-CM | POA: Diagnosis not present

## 2019-04-25 DIAGNOSIS — D696 Thrombocytopenia, unspecified: Secondary | ICD-10-CM

## 2019-04-25 DIAGNOSIS — Z950 Presence of cardiac pacemaker: Secondary | ICD-10-CM

## 2019-04-25 NOTE — Progress Notes (Signed)
Patient stated that she had been doing well with no complaints. 

## 2019-05-31 DIAGNOSIS — I495 Sick sinus syndrome: Secondary | ICD-10-CM | POA: Diagnosis not present

## 2019-07-26 DIAGNOSIS — I872 Venous insufficiency (chronic) (peripheral): Secondary | ICD-10-CM | POA: Diagnosis not present

## 2019-07-26 DIAGNOSIS — E782 Mixed hyperlipidemia: Secondary | ICD-10-CM | POA: Diagnosis not present

## 2019-07-26 DIAGNOSIS — I10 Essential (primary) hypertension: Secondary | ICD-10-CM | POA: Diagnosis not present

## 2019-07-26 DIAGNOSIS — Z79899 Other long term (current) drug therapy: Secondary | ICD-10-CM | POA: Diagnosis not present

## 2019-07-26 DIAGNOSIS — I272 Pulmonary hypertension, unspecified: Secondary | ICD-10-CM | POA: Diagnosis not present

## 2019-07-26 DIAGNOSIS — I482 Chronic atrial fibrillation, unspecified: Secondary | ICD-10-CM | POA: Diagnosis not present

## 2019-08-04 DIAGNOSIS — G4733 Obstructive sleep apnea (adult) (pediatric): Secondary | ICD-10-CM | POA: Diagnosis not present

## 2019-08-04 DIAGNOSIS — I10 Essential (primary) hypertension: Secondary | ICD-10-CM | POA: Diagnosis not present

## 2019-08-04 DIAGNOSIS — I495 Sick sinus syndrome: Secondary | ICD-10-CM | POA: Diagnosis not present

## 2019-08-04 DIAGNOSIS — I071 Rheumatic tricuspid insufficiency: Secondary | ICD-10-CM | POA: Diagnosis not present

## 2019-08-04 DIAGNOSIS — I272 Pulmonary hypertension, unspecified: Secondary | ICD-10-CM | POA: Diagnosis not present

## 2019-08-04 DIAGNOSIS — I2510 Atherosclerotic heart disease of native coronary artery without angina pectoris: Secondary | ICD-10-CM | POA: Diagnosis not present

## 2019-08-04 DIAGNOSIS — I482 Chronic atrial fibrillation, unspecified: Secondary | ICD-10-CM | POA: Diagnosis not present

## 2019-08-04 DIAGNOSIS — Z9989 Dependence on other enabling machines and devices: Secondary | ICD-10-CM | POA: Diagnosis not present

## 2019-08-04 DIAGNOSIS — R600 Localized edema: Secondary | ICD-10-CM | POA: Diagnosis not present

## 2019-08-11 DIAGNOSIS — J90 Pleural effusion, not elsewhere classified: Secondary | ICD-10-CM | POA: Diagnosis not present

## 2019-08-11 DIAGNOSIS — J9811 Atelectasis: Secondary | ICD-10-CM | POA: Diagnosis not present

## 2019-08-11 DIAGNOSIS — Z79899 Other long term (current) drug therapy: Secondary | ICD-10-CM | POA: Diagnosis not present

## 2019-08-11 DIAGNOSIS — I10 Essential (primary) hypertension: Secondary | ICD-10-CM | POA: Diagnosis not present

## 2019-09-26 ENCOUNTER — Other Ambulatory Visit: Payer: Self-pay | Admitting: Internal Medicine

## 2019-09-26 DIAGNOSIS — D538 Other specified nutritional anemias: Secondary | ICD-10-CM | POA: Diagnosis not present

## 2019-09-26 DIAGNOSIS — E782 Mixed hyperlipidemia: Secondary | ICD-10-CM | POA: Diagnosis not present

## 2019-09-26 DIAGNOSIS — Z1231 Encounter for screening mammogram for malignant neoplasm of breast: Secondary | ICD-10-CM | POA: Diagnosis not present

## 2019-09-26 DIAGNOSIS — R600 Localized edema: Secondary | ICD-10-CM | POA: Diagnosis not present

## 2019-09-26 DIAGNOSIS — I10 Essential (primary) hypertension: Secondary | ICD-10-CM | POA: Diagnosis not present

## 2019-09-26 DIAGNOSIS — Z23 Encounter for immunization: Secondary | ICD-10-CM | POA: Diagnosis not present

## 2019-09-26 DIAGNOSIS — I482 Chronic atrial fibrillation, unspecified: Secondary | ICD-10-CM | POA: Diagnosis not present

## 2019-09-26 DIAGNOSIS — Z79899 Other long term (current) drug therapy: Secondary | ICD-10-CM | POA: Diagnosis not present

## 2019-09-26 DIAGNOSIS — F5104 Psychophysiologic insomnia: Secondary | ICD-10-CM | POA: Diagnosis not present

## 2019-09-27 ENCOUNTER — Ambulatory Visit
Admission: RE | Admit: 2019-09-27 | Discharge: 2019-09-27 | Disposition: A | Discharge status: Home / Self Care | Payer: PPO | Source: Ambulatory Visit | Attending: Internal Medicine | Admitting: Internal Medicine

## 2019-09-27 DIAGNOSIS — Z1231 Encounter for screening mammogram for malignant neoplasm of breast: Secondary | ICD-10-CM | POA: Insufficient documentation

## 2019-10-27 DIAGNOSIS — E782 Mixed hyperlipidemia: Secondary | ICD-10-CM | POA: Diagnosis not present

## 2019-10-27 DIAGNOSIS — G4733 Obstructive sleep apnea (adult) (pediatric): Secondary | ICD-10-CM | POA: Diagnosis not present

## 2019-10-27 DIAGNOSIS — I071 Rheumatic tricuspid insufficiency: Secondary | ICD-10-CM | POA: Diagnosis not present

## 2019-10-27 DIAGNOSIS — I495 Sick sinus syndrome: Secondary | ICD-10-CM | POA: Diagnosis not present

## 2019-10-27 DIAGNOSIS — I10 Essential (primary) hypertension: Secondary | ICD-10-CM | POA: Diagnosis not present

## 2019-10-27 DIAGNOSIS — Z9989 Dependence on other enabling machines and devices: Secondary | ICD-10-CM | POA: Diagnosis not present

## 2019-10-27 DIAGNOSIS — I2510 Atherosclerotic heart disease of native coronary artery without angina pectoris: Secondary | ICD-10-CM | POA: Diagnosis not present

## 2019-10-27 DIAGNOSIS — I480 Paroxysmal atrial fibrillation: Secondary | ICD-10-CM | POA: Diagnosis not present

## 2019-10-27 DIAGNOSIS — I482 Chronic atrial fibrillation, unspecified: Secondary | ICD-10-CM | POA: Diagnosis not present

## 2019-10-28 ENCOUNTER — Other Ambulatory Visit: Payer: Self-pay

## 2019-10-31 ENCOUNTER — Other Ambulatory Visit: Payer: Self-pay

## 2019-10-31 ENCOUNTER — Inpatient Hospital Stay: Payer: PPO | Attending: Oncology

## 2019-10-31 DIAGNOSIS — D472 Monoclonal gammopathy: Secondary | ICD-10-CM | POA: Insufficient documentation

## 2019-10-31 LAB — BASIC METABOLIC PANEL
Anion gap: 5 (ref 5–15)
BUN: 25 mg/dL — ABNORMAL HIGH (ref 8–23)
CO2: 27 mmol/L (ref 22–32)
Calcium: 8.7 mg/dL — ABNORMAL LOW (ref 8.9–10.3)
Chloride: 105 mmol/L (ref 98–111)
Creatinine, Ser: 1.2 mg/dL — ABNORMAL HIGH (ref 0.44–1.00)
GFR calc Af Amer: 49 mL/min — ABNORMAL LOW (ref >60)
GFR calc non Af Amer: 42 mL/min — ABNORMAL LOW (ref >60)
Glucose, Bld: 88 mg/dL (ref 70–99)
Potassium: 4 mmol/L (ref 3.5–5.1)
Sodium: 137 mmol/L (ref 135–145)

## 2019-10-31 LAB — CBC WITH DIFFERENTIAL/PLATELET
Abs Immature Granulocytes: 0.02 10*3/uL (ref 0.00–0.07)
Basophils Absolute: 0 10*3/uL (ref 0.0–0.1)
Basophils Relative: 0 %
Eosinophils Absolute: 0.1 10*3/uL (ref 0.0–0.5)
Eosinophils Relative: 3 %
HCT: 29.4 % — ABNORMAL LOW (ref 36.0–46.0)
Hemoglobin: 8.9 g/dL — ABNORMAL LOW (ref 12.0–15.0)
Immature Granulocytes: 0 %
Lymphocytes Relative: 28 %
Lymphs Abs: 1.3 10*3/uL (ref 0.7–4.0)
MCH: 31.8 pg (ref 26.0–34.0)
MCHC: 30.3 g/dL (ref 30.0–36.0)
MCV: 105 fL — ABNORMAL HIGH (ref 80.0–100.0)
Monocytes Absolute: 0.4 10*3/uL (ref 0.1–1.0)
Monocytes Relative: 10 %
Neutro Abs: 2.7 10*3/uL (ref 1.7–7.7)
Neutrophils Relative %: 59 %
Platelets: 124 10*3/uL — ABNORMAL LOW (ref 150–400)
RBC: 2.8 MIL/uL — ABNORMAL LOW (ref 3.87–5.11)
RDW: 15.2 % (ref 11.5–15.5)
WBC: 4.6 10*3/uL (ref 4.0–10.5)
nRBC: 0 % (ref 0.0–0.2)

## 2019-11-01 LAB — KAPPA/LAMBDA LIGHT CHAINS
Kappa free light chain: 102.2 mg/L — ABNORMAL HIGH (ref 3.3–19.4)
Kappa, lambda light chain ratio: 5.74 — ABNORMAL HIGH (ref 0.26–1.65)
Lambda free light chains: 17.8 mg/L (ref 5.7–26.3)

## 2019-11-01 LAB — PROTEIN ELECTROPHORESIS, SERUM
A/G Ratio: 0.9 (ref 0.7–1.7)
Albumin ELP: 3.6 g/dL (ref 2.9–4.4)
Alpha-1-Globulin: 0.2 g/dL (ref 0.0–0.4)
Alpha-2-Globulin: 0.7 g/dL (ref 0.4–1.0)
Beta Globulin: 0.8 g/dL (ref 0.7–1.3)
Gamma Globulin: 2.1 g/dL — ABNORMAL HIGH (ref 0.4–1.8)
Globulin, Total: 3.8 g/dL (ref 2.2–3.9)
M-Spike, %: 1.8 g/dL — ABNORMAL HIGH
Total Protein ELP: 7.4 g/dL (ref 6.0–8.5)

## 2019-11-01 LAB — IGG, IGA, IGM
IgA: 110 mg/dL (ref 64–422)
IgG (Immunoglobin G), Serum: 2868 mg/dL — ABNORMAL HIGH (ref 586–1602)
IgM (Immunoglobulin M), Srm: 48 mg/dL (ref 26–217)

## 2019-11-02 NOTE — Progress Notes (Signed)
[facility]  Telephone:(336) [phone] [phone]) [phone]  ID: Cynthia Wallace OB: 1936-12-01  MR#: [id number]  IFO#:[id number]  Patient Care Team: Idelle Crouch, MD as PCP - General (Internal Medicine)  I connected with Cynthia Wallace on 11/08/19 at  2:15 PM EST by video enabled telemedicine visit and verified that I am speaking with the correct person using two identifiers.   I discussed the limitations, risks, security and privacy concerns of performing an evaluation and management service by telemedicine and the availability of in-person appointments. I also discussed with the patient that there may be a patient responsible charge related to this service. The patient expressed understanding and agreed to proceed.   Other persons participating in the visit and their role in the encounter: Patient, MD  Patient's location: Home Provider's location: Clinic  CHIEF COMPLAINT: Thrombocytopenia, MGUS.  INTERVAL HISTORY: Patient agreed to video enabled telemedicine visit for discussion of her laboratory work and routine 12-monthevaluation.  She continues to feel well and remains asymptomatic.  She does not complain of any weakness or fatigue. She has no neurologic complaints. She denies any recent fevers or illnesses. She has a good appetite and denies weight loss.  She denies any chest pain, shortness of breath, cough, or hemoptysis.  She denies any nausea, vomiting, constipation, or diarrhea. She has no urinary complaint.  Patient offers no specific complaints today.  REVIEW OF SYSTEMS:   Review of Systems  Constitutional: Negative.  Negative for fever, malaise/fatigue and weight loss.  Respiratory: Negative.  Negative for cough and shortness of breath.   Cardiovascular: Negative.  Negative for chest pain and leg swelling.  Gastrointestinal: Negative.  Negative for abdominal pain, blood in stool and melena.  Genitourinary: Negative.  Negative for hematuria.   Musculoskeletal: Negative.  Negative for back pain.  Skin: Negative.  Negative for rash.  Neurological: Negative.  Negative for sensory change, focal weakness, weakness and headaches.  Endo/Heme/Allergies: Negative.  Does not bruise/bleed easily.  Psychiatric/Behavioral: Negative.  The patient is not nervous/anxious.     As per HPI. Otherwise, a complete review of systems is negative.  PAST MEDICAL HISTORY: Past Medical History:  Diagnosis Date  . Acid reflux `  . Adenomatous colon polyp   . Anemia   . Arthritis   . Asthma   . [place]   . Bilateral leg edema   . Chickenpox   . Chronic a-fib   . Coronary artery disease   . Fibrocystic breast disease   . Hyperlipidemia   . Hypertension   . Iron deficiency anemia   . Osteoporosis   . PUD (peptic ulcer disease)   . Sleep apnea   . Tricuspid valve insufficiency     PAST SURGICAL HISTORY: Past Surgical History:  Procedure Laterality Date  . ABDOMINAL HYSTERECTOMY    . BILATERAL KNEE ARTHROSCOPY    . BREAST EXCISIONAL BIOPSY Right 1990   NEG  . CARDIAC CATHETERIZATION    . COLONOSCOPY WITH PROPOFOL N/A 08/27/2016   Procedure: COLONOSCOPY WITH PROPOFOL;  Surgeon: RManya Silvas MD;  [facility]  Service: Endoscopy;  Laterality: N/A;  . INSERT / REPLACE / REMOVE PACEMAKER    . JOINT REPLACEMENT    . PACEMAKER INSERTION    . TUBAL LIGATION      FAMILY HISTORY: Family History  Problem Relation Age of Onset  . Hyperlipidemia Mother   . Hypertension Mother   . Heart disease Mother   . Heart disease Father   .  Cancer Sister   . Breast cancer Sister   . Stroke Sister   . Hyperlipidemia Sister   . Heart disease Sister     ADVANCED DIRECTIVES (Y/N):  N  HEALTH MAINTENANCE: Social History   Tobacco Use  . Smoking status: Never Smoker  . Smokeless tobacco: Never Used  Substance Use Topics  . Alcohol use: No  . Drug use: No     Colonoscopy:  PAP:  Bone density:  Lipid panel:   Allergies  Allergen Reactions  . Codeine Nausea Only  . Penicillin G Other (See Comments)    As a child    Current Outpatient Medications  Medication Sig Dispense Refill  . albuterol (PROVENTIL) (2.5 MG/3ML) 0.083% nebulizer solution Inhale into the lungs every 4 (four) hours as needed.    Marland Kitchen atorvastatin (LIPITOR) 10 MG tablet Take 10 mg by mouth.    . B Complex Vitamins (VITAMIN B COMPLEX PO) Take by mouth.    . Cholecalciferol (VITAMIN D-1000 MAX ST) 1000 units tablet Take by mouth.    . digoxin (LANOXIN) 0.125 MG tablet Take 0.125 mg by mouth daily.     . fluticasone (FLONASE) 50 MCG/ACT nasal spray SHAKE LIQUID AND USE 2 SPRAYS IN EACH NOSTRIL EVERY DAY AS NEEDED FOR RHINITIS OR ALLERGIES    . furosemide (LASIX) 20 MG tablet Take 40 mg by mouth daily. Pt to take 2 tablets once a day     . hydrOXYzine (ATARAX/VISTARIL) 10 MG tablet Take 1 tablet by mouth 3 (three) times daily.    Marland Kitchen losartan (COZAAR) 50 MG tablet Take 50 mg by mouth daily.    . Melatonin 10 MG TABS Take 1 tablet by mouth at bedtime as needed. Takes at night     . metoprolol succinate (TOPROL-XL) 50 MG 24 hr tablet Take 50 mg by mouth 2 (two) times daily.    . montelukast (SINGULAIR) 10 MG tablet Take 10 mg by mouth at bedtime.    . pantoprazole (PROTONIX) 40 MG tablet Take 40 mg by mouth daily.    . potassium chloride (K-DUR) 10 MEQ tablet Take 10 mEq by mouth 3 (three) times daily.     . rivaroxaban (XARELTO) 20 MG TABS tablet Take 20 mg by mouth daily with supper.    . traZODone (DESYREL) 50 MG tablet Take 50 mg by mouth at bedtime.    . vitamin B-12 (CYANOCOBALAMIN) 1000 MCG tablet Take by mouth.     No current facility-administered medications for this visit.     OBJECTIVE: There were no vitals filed for this visit.   There is no height or weight on file to calculate BMI.    ECOG FS:0 - Asymptomatic  General: Well-developed, well-nourished, no acute distress. HEENT: Normocephalic. Neuro: Alert, answering  all questions appropriately. Cranial nerves grossly intact. Psych: Normal affect.  LAB RESULTS:  Lab Results  Component Value Date   NA 137 10/31/2019   K 4.0 10/31/2019   CL 105 10/31/2019   CO2 27 10/31/2019   GLUCOSE 88 10/31/2019   BUN 25 (H) 10/31/2019   CREATININE 1.20 (H) 10/31/2019   CALCIUM 8.7 (L) 10/31/2019   GFRNONAA 42 (L) 10/31/2019   GFRAA 49 (L) 10/31/2019    Lab Results  Component Value Date   WBC 4.6 10/31/2019   NEUTROABS 2.7 10/31/2019   HGB 8.9 (L) 10/31/2019   HCT 29.4 (L) 10/31/2019   MCV 105.0 (H) 10/31/2019   PLT 124 (L) 10/31/2019   Lab Results  Component Value  Date   TOTALPROTELP 7.4 10/31/2019   ALBUMINELP 3.6 10/31/2019   A1GS 0.2 10/31/2019   A2GS 0.7 10/31/2019   BETS 0.8 10/31/2019   GAMS 2.1 (H) 10/31/2019   MSPIKE 1.8 (H) 10/31/2019   SPEI Comment 10/31/2019     STUDIES: No results found.  ASSESSMENT: Thrombocytopenia, MGUS.  PLAN:    1. MGUS: Patient had a bone survey at an outside facility on February 12, 2017 that did not reveal any evidence of lytic lesions. She had a bone marrow biopsy on February 23, 2017 which flow cytometry revealed 2% monoclonal plasma cells. Immunohistochemical stains revealed scattered focally small clusters of plasma cell approximate 6-13% of total bone marrow cells. These results are consistent with a diagnosis of MGUS.  Her M spike is ranged from 1.7-2.1 since that time.  Today's result is 1.8.  She has a persistently elevated IgG level ranging from 2800-3123.  Today's result is 2868.  Her kappa chains have trended up to 102.2 with a kappa/lambda light chain ratio of 5.74.  She continues to have a chronic anemia and thrombocytopenia, but no other evidence of endorgan damage.  Continue simple observation.  Return to clinic in 6 months with laboratory work and video assisted telemedicine visit.   2. Thrombocytopenia: Chronic and unchanged.  Platelet count slightly improved to 124. 3.  Anemia: Patient's  hemoglobin has trended down and is now 8.9. Previously, iron stores, B12, folate at outside physician were all within normal we discussed the possibility of initiating Retacrit if her hemoglobin continues to drop.  Return to clinic in 3 months for laboratory work only.  If her hemoglobin trends down again, will consider initiating treatment at that time.  I provided 15 minutes of face-to-face video visit time during this encounter, and > 50% was spent counseling as documented under my assessment & plan.   Patient expressed understanding and was in agreement with this plan. She also understands that She can call clinic at any time with any questions, concerns, or complaints.    Lloyd Huger, MD   11/08/2019 12:32 PM   Addendum: Patient had a bone survey at an outside facility on February 12, 2017 that did not reveal any evidence of lytic lesions. She had a bone marrow biopsy on February 23, 2017 which flow cytometry revealed 2% monoclonal plasma cells. Immunohistochemical stains revealed scattered focally small clusters of plasma cell approximate 6-13% of total bone marrow cells. These results are consistent with a diagnosis of MGUS.

## 2019-11-02 NOTE — Progress Notes (Signed)
Called patient no answer left message need to change to virtual visit if possible

## 2019-11-07 ENCOUNTER — Other Ambulatory Visit: Payer: Self-pay

## 2019-11-07 ENCOUNTER — Inpatient Hospital Stay (HOSPITAL_BASED_OUTPATIENT_CLINIC_OR_DEPARTMENT_OTHER): Payer: PPO | Admitting: Oncology

## 2019-11-07 DIAGNOSIS — D696 Thrombocytopenia, unspecified: Secondary | ICD-10-CM | POA: Diagnosis not present

## 2019-11-07 DIAGNOSIS — D472 Monoclonal gammopathy: Secondary | ICD-10-CM | POA: Diagnosis not present

## 2019-11-07 NOTE — Progress Notes (Signed)
Patient prescreened for appointment. Patient has no concerns or questions.  

## 2019-12-27 DIAGNOSIS — Z79899 Other long term (current) drug therapy: Secondary | ICD-10-CM | POA: Diagnosis not present

## 2019-12-27 DIAGNOSIS — R0602 Shortness of breath: Secondary | ICD-10-CM | POA: Diagnosis not present

## 2019-12-27 DIAGNOSIS — D538 Other specified nutritional anemias: Secondary | ICD-10-CM | POA: Diagnosis not present

## 2019-12-27 DIAGNOSIS — Z0000 Encounter for general adult medical examination without abnormal findings: Secondary | ICD-10-CM | POA: Diagnosis not present

## 2019-12-27 DIAGNOSIS — I482 Chronic atrial fibrillation, unspecified: Secondary | ICD-10-CM | POA: Diagnosis not present

## 2019-12-27 DIAGNOSIS — I10 Essential (primary) hypertension: Secondary | ICD-10-CM | POA: Diagnosis not present

## 2019-12-27 DIAGNOSIS — E782 Mixed hyperlipidemia: Secondary | ICD-10-CM | POA: Diagnosis not present

## 2020-01-26 DIAGNOSIS — G4733 Obstructive sleep apnea (adult) (pediatric): Secondary | ICD-10-CM | POA: Diagnosis not present

## 2020-02-06 ENCOUNTER — Inpatient Hospital Stay: Payer: PPO | Attending: Oncology

## 2020-02-06 DIAGNOSIS — D472 Monoclonal gammopathy: Secondary | ICD-10-CM | POA: Insufficient documentation

## 2020-02-17 ENCOUNTER — Inpatient Hospital Stay: Payer: PPO

## 2020-02-17 ENCOUNTER — Other Ambulatory Visit: Payer: Self-pay

## 2020-02-17 DIAGNOSIS — D472 Monoclonal gammopathy: Secondary | ICD-10-CM

## 2020-02-17 LAB — CBC WITH DIFFERENTIAL/PLATELET
Abs Immature Granulocytes: 0.02 10*3/uL (ref 0.00–0.07)
Basophils Absolute: 0 10*3/uL (ref 0.0–0.1)
Basophils Relative: 1 %
Eosinophils Absolute: 0.1 10*3/uL (ref 0.0–0.5)
Eosinophils Relative: 2 %
HCT: 27.8 % — ABNORMAL LOW (ref 36.0–46.0)
Hemoglobin: 8.6 g/dL — ABNORMAL LOW (ref 12.0–15.0)
Immature Granulocytes: 0 %
Lymphocytes Relative: 27 %
Lymphs Abs: 1.4 10*3/uL (ref 0.7–4.0)
MCH: 32.3 pg (ref 26.0–34.0)
MCHC: 30.9 g/dL (ref 30.0–36.0)
MCV: 104.5 fL — ABNORMAL HIGH (ref 80.0–100.0)
Monocytes Absolute: 0.5 10*3/uL (ref 0.1–1.0)
Monocytes Relative: 10 %
Neutro Abs: 3.1 10*3/uL (ref 1.7–7.7)
Neutrophils Relative %: 60 %
Platelets: 146 10*3/uL — ABNORMAL LOW (ref 150–400)
RBC: 2.66 MIL/uL — ABNORMAL LOW (ref 3.87–5.11)
RDW: 15.3 % (ref 11.5–15.5)
WBC: 5.2 10*3/uL (ref 4.0–10.5)
nRBC: 0 % (ref 0.0–0.2)

## 2020-02-17 LAB — BASIC METABOLIC PANEL
Anion gap: 6 (ref 5–15)
BUN: 23 mg/dL (ref 8–23)
CO2: 27 mmol/L (ref 22–32)
Calcium: 8.9 mg/dL (ref 8.9–10.3)
Chloride: 104 mmol/L (ref 98–111)
Creatinine, Ser: 1.23 mg/dL — ABNORMAL HIGH (ref 0.44–1.00)
GFR calc Af Amer: 48 mL/min — ABNORMAL LOW (ref >60)
GFR calc non Af Amer: 41 mL/min — ABNORMAL LOW (ref >60)
Glucose, Bld: 82 mg/dL (ref 70–99)
Potassium: 4 mmol/L (ref 3.5–5.1)
Sodium: 137 mmol/L (ref 135–145)

## 2020-02-18 LAB — IGG, IGA, IGM
IgA: 101 mg/dL (ref 64–422)
IgG (Immunoglobin G), Serum: 3039 mg/dL — ABNORMAL HIGH (ref 586–1602)
IgM (Immunoglobulin M), Srm: 40 mg/dL (ref 26–217)

## 2020-02-20 LAB — KAPPA/LAMBDA LIGHT CHAINS
Kappa free light chain: 122.2 mg/L — ABNORMAL HIGH (ref 3.3–19.4)
Kappa, lambda light chain ratio: 5.85 — ABNORMAL HIGH (ref 0.26–1.65)
Lambda free light chains: 20.9 mg/L (ref 5.7–26.3)

## 2020-02-21 LAB — PROTEIN ELECTROPHORESIS, SERUM
A/G Ratio: 0.8 (ref 0.7–1.7)
Albumin ELP: 3.5 g/dL (ref 2.9–4.4)
Alpha-1-Globulin: 0.3 g/dL (ref 0.0–0.4)
Alpha-2-Globulin: 0.8 g/dL (ref 0.4–1.0)
Beta Globulin: 0.8 g/dL (ref 0.7–1.3)
Gamma Globulin: 2.4 g/dL — ABNORMAL HIGH (ref 0.4–1.8)
Globulin, Total: 4.3 g/dL — ABNORMAL HIGH (ref 2.2–3.9)
M-Spike, %: 2 g/dL — ABNORMAL HIGH
Total Protein ELP: 7.8 g/dL (ref 6.0–8.5)

## 2020-03-08 DIAGNOSIS — M7061 Trochanteric bursitis, right hip: Secondary | ICD-10-CM | POA: Diagnosis not present

## 2020-03-12 DIAGNOSIS — M7061 Trochanteric bursitis, right hip: Secondary | ICD-10-CM | POA: Diagnosis not present

## 2020-03-27 DIAGNOSIS — Z79899 Other long term (current) drug therapy: Secondary | ICD-10-CM | POA: Diagnosis not present

## 2020-03-27 DIAGNOSIS — I10 Essential (primary) hypertension: Secondary | ICD-10-CM | POA: Diagnosis not present

## 2020-03-27 DIAGNOSIS — E782 Mixed hyperlipidemia: Secondary | ICD-10-CM | POA: Diagnosis not present

## 2020-03-27 DIAGNOSIS — I482 Chronic atrial fibrillation, unspecified: Secondary | ICD-10-CM | POA: Diagnosis not present

## 2020-03-27 DIAGNOSIS — D538 Other specified nutritional anemias: Secondary | ICD-10-CM | POA: Diagnosis not present

## 2020-03-27 DIAGNOSIS — I2510 Atherosclerotic heart disease of native coronary artery without angina pectoris: Secondary | ICD-10-CM | POA: Diagnosis not present

## 2020-04-02 DIAGNOSIS — M7061 Trochanteric bursitis, right hip: Secondary | ICD-10-CM | POA: Diagnosis not present

## 2020-04-02 DIAGNOSIS — M5416 Radiculopathy, lumbar region: Secondary | ICD-10-CM | POA: Diagnosis not present

## 2020-04-02 DIAGNOSIS — M5136 Other intervertebral disc degeneration, lumbar region: Secondary | ICD-10-CM | POA: Diagnosis not present

## 2020-04-02 DIAGNOSIS — M47816 Spondylosis without myelopathy or radiculopathy, lumbar region: Secondary | ICD-10-CM | POA: Diagnosis not present

## 2020-04-04 DIAGNOSIS — H35372 Puckering of macula, left eye: Secondary | ICD-10-CM | POA: Diagnosis not present

## 2020-04-25 DIAGNOSIS — E782 Mixed hyperlipidemia: Secondary | ICD-10-CM | POA: Diagnosis not present

## 2020-04-25 DIAGNOSIS — I10 Essential (primary) hypertension: Secondary | ICD-10-CM | POA: Diagnosis not present

## 2020-04-25 DIAGNOSIS — Z9989 Dependence on other enabling machines and devices: Secondary | ICD-10-CM | POA: Diagnosis not present

## 2020-04-25 DIAGNOSIS — I495 Sick sinus syndrome: Secondary | ICD-10-CM | POA: Diagnosis not present

## 2020-04-25 DIAGNOSIS — I482 Chronic atrial fibrillation, unspecified: Secondary | ICD-10-CM | POA: Diagnosis not present

## 2020-04-25 DIAGNOSIS — G4733 Obstructive sleep apnea (adult) (pediatric): Secondary | ICD-10-CM | POA: Diagnosis not present

## 2020-04-25 DIAGNOSIS — I2510 Atherosclerotic heart disease of native coronary artery without angina pectoris: Secondary | ICD-10-CM | POA: Diagnosis not present

## 2020-04-25 DIAGNOSIS — R0602 Shortness of breath: Secondary | ICD-10-CM | POA: Diagnosis not present

## 2020-04-26 ENCOUNTER — Other Ambulatory Visit: Payer: Self-pay | Admitting: Internal Medicine

## 2020-04-26 DIAGNOSIS — Z1231 Encounter for screening mammogram for malignant neoplasm of breast: Secondary | ICD-10-CM

## 2020-05-01 DIAGNOSIS — I495 Sick sinus syndrome: Secondary | ICD-10-CM | POA: Diagnosis not present

## 2020-05-08 ENCOUNTER — Inpatient Hospital Stay: Payer: PPO | Attending: Oncology

## 2020-05-08 ENCOUNTER — Other Ambulatory Visit: Payer: Self-pay

## 2020-05-08 DIAGNOSIS — D472 Monoclonal gammopathy: Secondary | ICD-10-CM | POA: Insufficient documentation

## 2020-05-08 DIAGNOSIS — Z79899 Other long term (current) drug therapy: Secondary | ICD-10-CM | POA: Insufficient documentation

## 2020-05-08 DIAGNOSIS — D696 Thrombocytopenia, unspecified: Secondary | ICD-10-CM | POA: Diagnosis not present

## 2020-05-08 DIAGNOSIS — I4891 Unspecified atrial fibrillation: Secondary | ICD-10-CM | POA: Diagnosis not present

## 2020-05-08 DIAGNOSIS — I10 Essential (primary) hypertension: Secondary | ICD-10-CM | POA: Diagnosis not present

## 2020-05-08 LAB — CBC WITH DIFFERENTIAL/PLATELET
Abs Immature Granulocytes: 0.02 10*3/uL (ref 0.00–0.07)
Basophils Absolute: 0 10*3/uL (ref 0.0–0.1)
Basophils Relative: 0 %
Eosinophils Absolute: 0.1 10*3/uL (ref 0.0–0.5)
Eosinophils Relative: 1 %
HCT: 29.2 % — ABNORMAL LOW (ref 36.0–46.0)
Hemoglobin: 9.1 g/dL — ABNORMAL LOW (ref 12.0–15.0)
Immature Granulocytes: 0 %
Lymphocytes Relative: 22 %
Lymphs Abs: 1.1 10*3/uL (ref 0.7–4.0)
MCH: 32.3 pg (ref 26.0–34.0)
MCHC: 31.2 g/dL (ref 30.0–36.0)
MCV: 103.5 fL — ABNORMAL HIGH (ref 80.0–100.0)
Monocytes Absolute: 0.5 10*3/uL (ref 0.1–1.0)
Monocytes Relative: 9 %
Neutro Abs: 3.4 10*3/uL (ref 1.7–7.7)
Neutrophils Relative %: 68 %
Platelets: 149 10*3/uL — ABNORMAL LOW (ref 150–400)
RBC: 2.82 MIL/uL — ABNORMAL LOW (ref 3.87–5.11)
RDW: 15.9 % — ABNORMAL HIGH (ref 11.5–15.5)
WBC: 5.1 10*3/uL (ref 4.0–10.5)
nRBC: 0 % (ref 0.0–0.2)

## 2020-05-08 LAB — BASIC METABOLIC PANEL
Anion gap: 5 (ref 5–15)
BUN: 19 mg/dL (ref 8–23)
CO2: 27 mmol/L (ref 22–32)
Calcium: 8.8 mg/dL — ABNORMAL LOW (ref 8.9–10.3)
Chloride: 108 mmol/L (ref 98–111)
Creatinine, Ser: 0.88 mg/dL (ref 0.44–1.00)
GFR calc Af Amer: >60 mL/min (ref >60)
GFR calc non Af Amer: >60 mL/min (ref >60)
Glucose, Bld: 108 mg/dL — ABNORMAL HIGH (ref 70–99)
Potassium: 4.1 mmol/L (ref 3.5–5.1)
Sodium: 140 mmol/L (ref 135–145)

## 2020-05-09 LAB — IGG, IGA, IGM
IgA: 81 mg/dL (ref 64–422)
IgG (Immunoglobin G), Serum: 2298 mg/dL — ABNORMAL HIGH (ref 586–1602)
IgM (Immunoglobulin M), Srm: 39 mg/dL (ref 26–217)

## 2020-05-09 LAB — PROTEIN ELECTROPHORESIS, SERUM
A/G Ratio: 0.9 (ref 0.7–1.7)
Albumin ELP: 3.5 g/dL (ref 2.9–4.4)
Alpha-1-Globulin: 0.2 g/dL (ref 0.0–0.4)
Alpha-2-Globulin: 0.8 g/dL (ref 0.4–1.0)
Beta Globulin: 0.9 g/dL (ref 0.7–1.3)
Gamma Globulin: 2 g/dL — ABNORMAL HIGH (ref 0.4–1.8)
Globulin, Total: 3.9 g/dL (ref 2.2–3.9)
M-Spike, %: 1.6 g/dL — ABNORMAL HIGH
Total Protein ELP: 7.4 g/dL (ref 6.0–8.5)

## 2020-05-09 LAB — KAPPA/LAMBDA LIGHT CHAINS
Kappa free light chain: 78.2 mg/L — ABNORMAL HIGH (ref 3.3–19.4)
Kappa, lambda light chain ratio: 6.26 — ABNORMAL HIGH (ref 0.26–1.65)
Lambda free light chains: 12.5 mg/L (ref 5.7–26.3)

## 2020-05-11 NOTE — Progress Notes (Signed)
[facility]  [phone] [phone]  ID: Cynthia Wallace OB: 09/30/1936  MR#: [id number]  CSN#:[id number]  Patient Care Team: Sparks, Jeffrey D, MD as PCP - General (Internal Medicine) Finnegan, Timothy J, MD as Consulting Physician (Oncology)  I connected with Cynthia Wallace on 05/16/20 at  1:45 PM EDT by video enabled telemedicine visit and verified that I am speaking with the correct person using two identifiers.   I discussed the limitations, risks, security and privacy concerns of performing an evaluation and management service by telemedicine and the availability of in-person appointments. I also discussed with the patient that there may be a patient responsible charge related to this service. The patient expressed understanding and agreed to proceed.   Other persons participating in the visit and their role in the encounter: Patient, MD.  Patient's location: Home. Provider's location: Clinic.  CHIEF COMPLAINT: Thrombocytopenia, MGUS.  INTERVAL HISTORY: Patient agreed to video enabled telemedicine visit for further evaluation and discussion of her laboratory work.  She continues to feel well remains asymptomatic.  She has no neurologic complaints. She denies any recent fevers or illnesses. She has a good appetite and denies weight loss.  She denies any chest pain, shortness of breath, cough, or hemoptysis.  She denies any nausea, vomiting, constipation, or diarrhea.  She has no urinary complaints.  Patient feels at her baseline offers no specific complaints today.  REVIEW OF SYSTEMS:   Review of Systems  Constitutional: Negative.  Negative for fever, malaise/fatigue and weight loss.  Respiratory: Negative.  Negative for cough and shortness of breath.   Cardiovascular: Negative.  Negative for chest pain and leg swelling.  Gastrointestinal: Negative.  Negative for abdominal pain, blood in stool and melena.  Genitourinary: Negative.  Negative for  hematuria.  Musculoskeletal: Negative.  Negative for back pain.  Skin: Negative.  Negative for rash.  Neurological: Negative.  Negative for sensory change, focal weakness, weakness and headaches.  Endo/Heme/Allergies: Negative.  Does not bruise/bleed easily.  Psychiatric/Behavioral: Negative.  The patient is not nervous/anxious.     As per HPI. Otherwise, a complete review of systems is negative.  PAST MEDICAL HISTORY: Past Medical History:  Diagnosis Date  . Acid reflux `  . Adenomatous colon polyp   . Anemia   . Arthritis   . Asthma   . Atrial fibrillation ([facility])   . Bilateral leg edema   . Chickenpox   . Chronic a-fib (H[facility])   . Coronary artery disease   . Fibrocystic breast disease   . Hyperlipidemia   . Hypertension   . Iron deficiency anemia   . Osteoporosis   . PUD (peptic ulcer disease)   . Sleep apnea   . Tricuspid valve insufficiency     PAST SURGICAL HISTORY: Past Surgical History:  Procedure Laterality Date  . ABDOMINAL HYSTERECTOMY    . BILATERAL KNEE ARTHROSCOPY    . BREAST EXCISIONAL BIOPSY Right 1990   NEG  . CARDIAC CATHETERIZATION    . COLONOSCOPY WITH PROPOFOL N/A 08/27/2016   Procedure: COLONOSCOPY WITH PROPOFOL;  Surgeon: Robert T Elliott, MD;  Location: [facility] ENDOSCOPY;  Service: Endoscopy;  Laterality: N/A;  . INSERT / REPLACE / REMOVE PACEMAKER    . JOINT REPLACEMENT    . PACEMAKER INSERTION    . TUBAL LIGATION      FAMILY HISTORY: Family History  Problem Relation Age of Onset  . Hyperlipidemia Mother   . Hypertension Mother   . Heart disease Mother   . Heart   disease Father   . Cancer Sister   . Breast cancer Sister   . Stroke Sister   . Hyperlipidemia Sister   . Heart disease Sister     ADVANCED DIRECTIVES (Y/N):  N  HEALTH MAINTENANCE: Social History   Tobacco Use  . Smoking status: Never Smoker  . Smokeless tobacco: Never Used  Substance Use Topics  . Alcohol use: No  . Drug use: No     Colonoscopy:  PAP:  Bone  density:  Lipid panel:  Allergies  Allergen Reactions  . Codeine Nausea Only  . Penicillin G Other (See Comments)    As a child    Current Outpatient Medications  Medication Sig Dispense Refill  . atorvastatin (LIPITOR) 10 MG tablet Take 10 mg by mouth.    . B Complex Vitamins (VITAMIN B COMPLEX PO) Take by mouth.    . Cholecalciferol (VITAMIN D-1000 MAX ST) 1000 units tablet Take by mouth.    . digoxin (LANOXIN) 0.125 MG tablet Take 0.125 mg by mouth daily.     . fluticasone (FLONASE) 50 MCG/ACT nasal spray SHAKE LIQUID AND USE 2 SPRAYS IN EACH NOSTRIL EVERY DAY AS NEEDED FOR RHINITIS OR ALLERGIES    . furosemide (LASIX) 20 MG tablet Take 40 mg by mouth daily. Pt to take 2 tablets once a day     . levocetirizine (XYZAL) 5 MG tablet Take 5 mg by mouth daily.    Marland Kitchen losartan (COZAAR) 50 MG tablet Take 50 mg by mouth daily.    . metoprolol succinate (TOPROL-XL) 50 MG 24 hr tablet Take 50 mg by mouth 2 (two) times daily.    . potassium chloride (K-DUR) 10 MEQ tablet Take 10 mEq by mouth 3 (three) times daily.     . rivaroxaban (XARELTO) 20 MG TABS tablet Take 20 mg by mouth daily with supper.    . traMADol (ULTRAM) 50 MG tablet Take 50 mg by mouth 4 (four) times daily as needed.    . traZODone (DESYREL) 50 MG tablet Take 50 mg by mouth at bedtime.    . vitamin B-12 (CYANOCOBALAMIN) 1000 MCG tablet Take by mouth.    Marland Kitchen albuterol (PROVENTIL) (2.5 MG/3ML) 0.083% nebulizer solution Inhale into the lungs every 4 (four) hours as needed.    . hydrOXYzine (ATARAX/VISTARIL) 10 MG tablet Take 1 tablet by mouth 3 (three) times daily.    . Melatonin 10 MG TABS Take 1 tablet by mouth at bedtime as needed. Takes at night     . montelukast (SINGULAIR) 10 MG tablet Take 10 mg by mouth at bedtime.    . pantoprazole (PROTONIX) 40 MG tablet Take 40 mg by mouth daily.     No current facility-administered medications for this visit.    OBJECTIVE: There were no vitals filed for this visit.   There is no  height or weight on file to calculate BMI.    ECOG FS:0 - Asymptomatic  General: Well-developed, well-nourished, no acute distress. HEENT: Normocephalic. Neuro: Alert, answering all questions appropriately. Cranial nerves grossly intact. Psych: Normal affect.  LAB RESULTS:  Lab Results  Component Value Date   NA 140 05/08/2020   K 4.1 05/08/2020   CL 108 05/08/2020   CO2 27 05/08/2020   GLUCOSE 108 (H) 05/08/2020   BUN 19 05/08/2020   CREATININE 0.88 05/08/2020   CALCIUM 8.8 (L) 05/08/2020   GFRNONAA >60 05/08/2020   GFRAA >60 05/08/2020    Lab Results  Component Value Date   WBC 5.1 05/08/2020  NEUTROABS 3.4 05/08/2020   HGB 9.1 (L) 05/08/2020   HCT 29.2 (L) 05/08/2020   MCV 103.5 (H) 05/08/2020   PLT 149 (L) 05/08/2020   Lab Results  Component Value Date   TOTALPROTELP 7.4 05/08/2020   ALBUMINELP 3.5 05/08/2020   A1GS 0.2 05/08/2020   A2GS 0.8 05/08/2020   BETS 0.9 05/08/2020   GAMS 2.0 (H) 05/08/2020   MSPIKE 1.6 (H) 05/08/2020   SPEI Comment 05/08/2020     STUDIES: No results found.  ASSESSMENT: Thrombocytopenia, MGUS.  PLAN:    1. MGUS: Patient had a bone survey at an outside facility on February 12, 2017 that did not reveal any evidence of lytic lesions. She had a bone marrow biopsy on February 23, 2017 which flow cytometry revealed 2% monoclonal plasma cells. Immunohistochemical stains revealed scattered focally small clusters of plasma cell approximate 6-13% of total bone marrow cells. These results are consistent with a diagnosis of MGUS.  Her M spike is ranged from 1.7-2.1 since that time.  Her most recent result is 1.6.  She has a persistently elevated IgG level ranging from 2800-3123.  Her most recent result is 2298.  Kappa chains are elevated at 78.2 with a kappa/lambda light chain ratio of 6.26.  She continues to have a chronic anemia and thrombocytopenia, but no other evidence of endorgan damage.  Continue simple observation.  Return to clinic in 6 months  with repeat laboratory work and video assisted telemedicine visit. 2. Thrombocytopenia: Improved.  Patient's platelet count is 149 today. 3.  Anemia: Chronic and unchanged.  Patient's hemoglobin is 9.1.  Previously, iron stores, B12, folate at outside physician were all within normal we discussed the possibility of initiating Retacrit if her hemoglobin continues to drop.  Return to clinic in 6 months as above.  I provided 20 minutes of face-to-face video visit time during this encounter which included chart review, counseling, and coordination of care as documented above.   Patient expressed understanding and was in agreement with this plan. She also understands that She can call clinic at any time with any questions, concerns, or complaints.    Lloyd Huger, MD   05/16/2020 2:15 PM

## 2020-05-15 ENCOUNTER — Encounter: Payer: Self-pay | Admitting: Oncology

## 2020-05-15 ENCOUNTER — Other Ambulatory Visit: Payer: Self-pay

## 2020-05-15 ENCOUNTER — Inpatient Hospital Stay (HOSPITAL_BASED_OUTPATIENT_CLINIC_OR_DEPARTMENT_OTHER): Payer: PPO | Admitting: Oncology

## 2020-05-15 DIAGNOSIS — D472 Monoclonal gammopathy: Secondary | ICD-10-CM

## 2020-05-15 DIAGNOSIS — E782 Mixed hyperlipidemia: Secondary | ICD-10-CM | POA: Insufficient documentation

## 2020-05-15 DIAGNOSIS — E7800 Pure hypercholesterolemia, unspecified: Secondary | ICD-10-CM | POA: Insufficient documentation

## 2020-05-15 DIAGNOSIS — D696 Thrombocytopenia, unspecified: Secondary | ICD-10-CM | POA: Diagnosis not present

## 2020-05-15 NOTE — Progress Notes (Signed)
Patient prescreened for appointment. Patient has no concerns or questions.  

## 2020-05-31 DIAGNOSIS — R0602 Shortness of breath: Secondary | ICD-10-CM | POA: Diagnosis not present

## 2020-06-15 DIAGNOSIS — I482 Chronic atrial fibrillation, unspecified: Secondary | ICD-10-CM | POA: Diagnosis not present

## 2020-06-15 DIAGNOSIS — I2510 Atherosclerotic heart disease of native coronary artery without angina pectoris: Secondary | ICD-10-CM | POA: Diagnosis not present

## 2020-06-15 DIAGNOSIS — I272 Pulmonary hypertension, unspecified: Secondary | ICD-10-CM | POA: Diagnosis not present

## 2020-06-15 DIAGNOSIS — I495 Sick sinus syndrome: Secondary | ICD-10-CM | POA: Diagnosis not present

## 2020-06-15 DIAGNOSIS — G4733 Obstructive sleep apnea (adult) (pediatric): Secondary | ICD-10-CM | POA: Diagnosis not present

## 2020-06-15 DIAGNOSIS — I10 Essential (primary) hypertension: Secondary | ICD-10-CM | POA: Diagnosis not present

## 2020-06-15 DIAGNOSIS — Z9989 Dependence on other enabling machines and devices: Secondary | ICD-10-CM | POA: Diagnosis not present

## 2020-06-15 DIAGNOSIS — I071 Rheumatic tricuspid insufficiency: Secondary | ICD-10-CM | POA: Diagnosis not present

## 2020-07-09 DIAGNOSIS — G4733 Obstructive sleep apnea (adult) (pediatric): Secondary | ICD-10-CM | POA: Diagnosis not present

## 2020-07-09 DIAGNOSIS — Z79899 Other long term (current) drug therapy: Secondary | ICD-10-CM | POA: Diagnosis not present

## 2020-07-09 DIAGNOSIS — D538 Other specified nutritional anemias: Secondary | ICD-10-CM | POA: Diagnosis not present

## 2020-07-09 DIAGNOSIS — E782 Mixed hyperlipidemia: Secondary | ICD-10-CM | POA: Diagnosis not present

## 2020-07-09 DIAGNOSIS — I2510 Atherosclerotic heart disease of native coronary artery without angina pectoris: Secondary | ICD-10-CM | POA: Diagnosis not present

## 2020-07-09 DIAGNOSIS — Z9989 Dependence on other enabling machines and devices: Secondary | ICD-10-CM | POA: Diagnosis not present

## 2020-07-09 DIAGNOSIS — I482 Chronic atrial fibrillation, unspecified: Secondary | ICD-10-CM | POA: Diagnosis not present

## 2020-07-09 DIAGNOSIS — I10 Essential (primary) hypertension: Secondary | ICD-10-CM | POA: Diagnosis not present

## 2020-08-07 ENCOUNTER — Ambulatory Visit: Payer: PPO | Attending: Neurology

## 2020-08-07 DIAGNOSIS — G4733 Obstructive sleep apnea (adult) (pediatric): Secondary | ICD-10-CM | POA: Diagnosis not present

## 2020-08-08 ENCOUNTER — Other Ambulatory Visit: Payer: Self-pay

## 2020-08-11 DIAGNOSIS — G4730 Sleep apnea, unspecified: Secondary | ICD-10-CM | POA: Diagnosis not present

## 2020-09-08 DIAGNOSIS — G4730 Sleep apnea, unspecified: Secondary | ICD-10-CM | POA: Diagnosis not present

## 2020-09-20 ENCOUNTER — Other Ambulatory Visit
Admission: RE | Admit: 2020-09-20 | Discharge: 2020-09-20 | Disposition: A | Discharge status: Home / Self Care | Payer: PPO | Source: Ambulatory Visit | Attending: Internal Medicine | Admitting: Internal Medicine

## 2020-09-20 DIAGNOSIS — R0601 Orthopnea: Secondary | ICD-10-CM | POA: Diagnosis not present

## 2020-09-20 DIAGNOSIS — J9811 Atelectasis: Secondary | ICD-10-CM | POA: Diagnosis not present

## 2020-09-20 DIAGNOSIS — I517 Cardiomegaly: Secondary | ICD-10-CM | POA: Diagnosis not present

## 2020-09-20 DIAGNOSIS — R0600 Dyspnea, unspecified: Secondary | ICD-10-CM | POA: Insufficient documentation

## 2020-09-20 DIAGNOSIS — Z03818 Encounter for observation for suspected exposure to other biological agents ruled out: Secondary | ICD-10-CM | POA: Insufficient documentation

## 2020-09-20 DIAGNOSIS — J90 Pleural effusion, not elsewhere classified: Secondary | ICD-10-CM | POA: Diagnosis not present

## 2020-09-20 DIAGNOSIS — I509 Heart failure, unspecified: Secondary | ICD-10-CM | POA: Diagnosis not present

## 2020-09-20 LAB — BRAIN NATRIURETIC PEPTIDE: B Natriuretic Peptide: 369.2 pg/mL — ABNORMAL HIGH (ref 0.0–100.0)

## 2020-09-27 ENCOUNTER — Other Ambulatory Visit: Payer: Self-pay

## 2020-09-27 ENCOUNTER — Ambulatory Visit
Admission: RE | Admit: 2020-09-27 | Discharge: 2020-09-27 | Disposition: A | Discharge status: Home / Self Care | Payer: PPO | Source: Ambulatory Visit | Attending: Internal Medicine | Admitting: Internal Medicine

## 2020-09-27 DIAGNOSIS — Z1231 Encounter for screening mammogram for malignant neoplasm of breast: Secondary | ICD-10-CM | POA: Diagnosis not present

## 2020-10-01 DIAGNOSIS — Z9989 Dependence on other enabling machines and devices: Secondary | ICD-10-CM | POA: Diagnosis not present

## 2020-10-01 DIAGNOSIS — I495 Sick sinus syndrome: Secondary | ICD-10-CM | POA: Diagnosis not present

## 2020-10-01 DIAGNOSIS — I10 Essential (primary) hypertension: Secondary | ICD-10-CM | POA: Diagnosis not present

## 2020-10-01 DIAGNOSIS — I071 Rheumatic tricuspid insufficiency: Secondary | ICD-10-CM | POA: Diagnosis not present

## 2020-10-01 DIAGNOSIS — G4733 Obstructive sleep apnea (adult) (pediatric): Secondary | ICD-10-CM | POA: Diagnosis not present

## 2020-10-01 DIAGNOSIS — I2510 Atherosclerotic heart disease of native coronary artery without angina pectoris: Secondary | ICD-10-CM | POA: Diagnosis not present

## 2020-10-01 DIAGNOSIS — I482 Chronic atrial fibrillation, unspecified: Secondary | ICD-10-CM | POA: Diagnosis not present

## 2020-10-01 DIAGNOSIS — E782 Mixed hyperlipidemia: Secondary | ICD-10-CM | POA: Diagnosis not present

## 2020-10-01 DIAGNOSIS — R0602 Shortness of breath: Secondary | ICD-10-CM | POA: Diagnosis not present

## 2020-10-10 DIAGNOSIS — Z79899 Other long term (current) drug therapy: Secondary | ICD-10-CM | POA: Diagnosis not present

## 2020-10-10 DIAGNOSIS — Z9989 Dependence on other enabling machines and devices: Secondary | ICD-10-CM | POA: Diagnosis not present

## 2020-10-10 DIAGNOSIS — D649 Anemia, unspecified: Secondary | ICD-10-CM | POA: Diagnosis not present

## 2020-10-10 DIAGNOSIS — Z23 Encounter for immunization: Secondary | ICD-10-CM | POA: Diagnosis not present

## 2020-10-10 DIAGNOSIS — G4733 Obstructive sleep apnea (adult) (pediatric): Secondary | ICD-10-CM | POA: Diagnosis not present

## 2020-10-10 DIAGNOSIS — D472 Monoclonal gammopathy: Secondary | ICD-10-CM | POA: Diagnosis not present

## 2020-10-10 DIAGNOSIS — I10 Essential (primary) hypertension: Secondary | ICD-10-CM | POA: Diagnosis not present

## 2020-10-10 DIAGNOSIS — I482 Chronic atrial fibrillation, unspecified: Secondary | ICD-10-CM | POA: Diagnosis not present

## 2020-10-16 ENCOUNTER — Other Ambulatory Visit: Payer: Self-pay | Admitting: *Deleted

## 2020-10-16 DIAGNOSIS — D472 Monoclonal gammopathy: Secondary | ICD-10-CM

## 2020-10-17 DIAGNOSIS — D472 Monoclonal gammopathy: Secondary | ICD-10-CM | POA: Diagnosis not present

## 2020-10-17 DIAGNOSIS — I10 Essential (primary) hypertension: Secondary | ICD-10-CM | POA: Diagnosis not present

## 2020-10-17 DIAGNOSIS — Z79899 Other long term (current) drug therapy: Secondary | ICD-10-CM | POA: Diagnosis not present

## 2020-10-17 DIAGNOSIS — D649 Anemia, unspecified: Secondary | ICD-10-CM | POA: Diagnosis not present

## 2020-10-17 DIAGNOSIS — Z9989 Dependence on other enabling machines and devices: Secondary | ICD-10-CM | POA: Diagnosis not present

## 2020-10-17 DIAGNOSIS — I482 Chronic atrial fibrillation, unspecified: Secondary | ICD-10-CM | POA: Diagnosis not present

## 2020-10-17 DIAGNOSIS — Z23 Encounter for immunization: Secondary | ICD-10-CM | POA: Diagnosis not present

## 2020-10-17 DIAGNOSIS — G4733 Obstructive sleep apnea (adult) (pediatric): Secondary | ICD-10-CM | POA: Diagnosis not present

## 2020-10-19 ENCOUNTER — Other Ambulatory Visit: Payer: Self-pay | Admitting: *Deleted

## 2020-10-19 ENCOUNTER — Encounter: Payer: Self-pay | Admitting: Oncology

## 2020-10-19 DIAGNOSIS — D472 Monoclonal gammopathy: Secondary | ICD-10-CM

## 2020-10-20 NOTE — Progress Notes (Signed)
[place]  [phone] [phone]  ID: Cynthia Wallace OB: November 19, 1936  MR#: [id number]  AVW#:[id number]  Patient Care Team: Idelle Crouch, MD as PCP - General (Internal Medicine) Lloyd Huger, MD as Consulting Physician (Oncology) Corey Skains, MD as Consulting Physician (Cardiology)   CHIEF COMPLAINT: Anemia, MGUS.  INTERVAL HISTORY: Patient returns to clinic today as an add-on after primary care noted significantly declining hemoglobin.  She has increased weakness and fatigue, but otherwise feels well.  She denies any pain. She has no neurologic complaints. She denies any recent fevers or illnesses. She has a good appetite and denies weight loss.  She denies any chest pain, shortness of breath, cough, or hemoptysis.  She denies any nausea, vomiting, constipation, or diarrhea.  She has no urinary complaints.  Patient offers no further specific complaints today.  REVIEW OF SYSTEMS:   Review of Systems  Constitutional: Positive for malaise/fatigue. Negative for fever and weight loss.  Respiratory: Negative.  Negative for cough and shortness of breath.   Cardiovascular: Negative.  Negative for chest pain and leg swelling.  Gastrointestinal: Negative.  Negative for abdominal pain, blood in stool and melena.  Genitourinary: Negative.  Negative for hematuria.  Musculoskeletal: Negative.  Negative for back pain.  Skin: Negative.  Negative for rash.  Neurological: Positive for weakness. Negative for sensory change, focal weakness and headaches.  Endo/Heme/Allergies: Negative.  Does not bruise/bleed easily.  Psychiatric/Behavioral: Negative.  The patient is not nervous/anxious.     As per HPI. Otherwise, a complete review of systems is negative.  PAST MEDICAL HISTORY: Past Medical History:  Diagnosis Date  . Acid reflux `  . Adenomatous colon polyp   . Anemia   . Arthritis   . Asthma   . Atrial fibrillation ([place])   . Bilateral  leg edema   . Chickenpox   . Chronic a-fib ([place])   . Coronary artery disease   . Fibrocystic breast disease   . Hyperlipidemia   . Hypertension   . Iron deficiency anemia   . Osteoporosis   . PUD (peptic ulcer disease)   . Sleep apnea   . Tricuspid valve insufficiency     PAST SURGICAL HISTORY: Past Surgical History:  Procedure Laterality Date  . ABDOMINAL HYSTERECTOMY    . BILATERAL KNEE ARTHROSCOPY    . BREAST EXCISIONAL BIOPSY Right 1990   NEG  . CARDIAC CATHETERIZATION    . COLONOSCOPY WITH PROPOFOL N/A 08/27/2016   Procedure: COLONOSCOPY WITH PROPOFOL;  Surgeon: Manya Silvas, MD;  Location: [facility] ENDOSCOPY;  Service: Endoscopy;  Laterality: N/A;  . INSERT / REPLACE / REMOVE PACEMAKER    . JOINT REPLACEMENT    . PACEMAKER INSERTION    . TUBAL LIGATION      FAMILY HISTORY: Family History  Adopted: Yes  Problem Relation Age of Onset  . Hyperlipidemia Mother   . Hypertension Mother   . Heart disease Mother   . Heart disease Father   . Cancer Sister   . Breast cancer Sister   . Stroke Sister   . Hyperlipidemia Sister   . Heart disease Sister     ADVANCED DIRECTIVES (Y/N):  N  HEALTH MAINTENANCE: Social History   Tobacco Use  . Smoking status: Never Smoker  . Smokeless tobacco: Never Used  Substance Use Topics  . Alcohol use: No  . Drug use: No     Colonoscopy:  PAP:  Bone density:  Lipid panel:  Allergies  Allergen Reactions  .  Codeine Nausea Only  . Penicillin G Other (See Comments)    As a child    Current Outpatient Medications  Medication Sig Dispense Refill  . albuterol (PROVENTIL) (2.5 MG/3ML) 0.083% nebulizer solution Inhale into the lungs every 4 (four) hours as needed.    Marland Kitchen atorvastatin (LIPITOR) 10 MG tablet Take 10 mg by mouth.    . B Complex Vitamins (VITAMIN B COMPLEX PO) Take by mouth.    . Cholecalciferol (VITAMIN D-1000 MAX ST) 1000 units tablet Take by mouth.    . digoxin (LANOXIN) 0.125 MG tablet Take 0.125 mg by mouth  daily.     . fluticasone (FLONASE) 50 MCG/ACT nasal spray SHAKE LIQUID AND USE 2 SPRAYS IN EACH NOSTRIL EVERY DAY AS NEEDED FOR RHINITIS OR ALLERGIES    . furosemide (LASIX) 20 MG tablet Take 40 mg by mouth daily. Pt to take 2 tablets once a day     . hydrOXYzine (ATARAX/VISTARIL) 10 MG tablet Take 1 tablet by mouth 3 (three) times daily.    Marland Kitchen levocetirizine (XYZAL) 5 MG tablet Take 5 mg by mouth daily.    Marland Kitchen losartan (COZAAR) 50 MG tablet Take 50 mg by mouth daily.    . Melatonin 10 MG TABS Take 1 tablet by mouth at bedtime as needed. Takes at night     . metoprolol succinate (TOPROL-XL) 50 MG 24 hr tablet Take 50 mg by mouth 2 (two) times daily.    . montelukast (SINGULAIR) 10 MG tablet Take 10 mg by mouth at bedtime.    . potassium chloride (K-DUR) 10 MEQ tablet Take 10 mEq by mouth 3 (three) times daily.     . rivaroxaban (XARELTO) 20 MG TABS tablet Take 20 mg by mouth daily with supper.    . traMADol (ULTRAM) 50 MG tablet Take 50 mg by mouth 4 (four) times daily as needed.    . traZODone (DESYREL) 50 MG tablet Take 50 mg by mouth at bedtime.    . vitamin B-12 (CYANOCOBALAMIN) 1000 MCG tablet Take by mouth.    . pantoprazole (PROTONIX) 40 MG tablet Take 40 mg by mouth daily.     No current facility-administered medications for this visit.    OBJECTIVE: Vitals:   10/22/20 1018  BP: 120/77  Pulse: 85  Resp: 20  Temp: 97.8 F (36.6 C)  SpO2: 100%     Body mass index is 36.7 kg/m.    ECOG FS:0 - Asymptomatic   General: Well-developed, well-nourished, no acute distress. Eyes: Pink conjunctiva, anicteric sclera. HEENT: Normocephalic, moist mucous membranes. Lungs: No audible wheezing or coughing. Heart: Regular rate and rhythm. Abdomen: Soft, nontender, no obvious distention. Musculoskeletal: No edema, cyanosis, or clubbing. Neuro: Alert, answering all questions appropriately. Cranial nerves grossly intact. Skin: No rashes or petechiae noted. Psych: Normal affect.   LAB  RESULTS:  Lab Results  Component Value Date   NA 134 (L) 10/22/2020   K 3.9 10/22/2020   CL 106 10/22/2020   CO2 24 10/22/2020   GLUCOSE 106 (H) 10/22/2020   BUN 18 10/22/2020   CREATININE 1.26 (H) 10/22/2020   CALCIUM 8.5 (L) 10/22/2020   GFRNONAA 43 (L) 10/22/2020   GFRAA >60 05/08/2020    Lab Results  Component Value Date   WBC 4.6 10/22/2020   NEUTROABS 3.1 10/22/2020   HGB 7.6 (L) 10/22/2020   HCT 24.8 (L) 10/22/2020   MCV 103.8 (H) 10/22/2020   PLT 123 (L) 10/22/2020   Lab Results  Component Value Date  TOTALPROTELP 8.3 10/22/2020   ALBUMINELP 3.5 10/22/2020   A1GS 0.3 10/22/2020   A2GS 0.8 10/22/2020   BETS 0.9 10/22/2020   GAMS 2.9 (H) 10/22/2020   MSPIKE 2.5 (H) 10/22/2020   SPEI Comment 10/22/2020     STUDIES: MM 3D SCREEN BREAST BILATERAL  Result Date: 09/30/2020 CLINICAL DATA:  Screening. EXAM: DIGITAL SCREENING BILATERAL MAMMOGRAM WITH TOMO AND CAD COMPARISON:  Previous exam(s). ACR Breast Density Category c: The breast tissue is heterogeneously dense, which may obscure small masses. FINDINGS: There are no findings suspicious for malignancy. Images were processed with CAD. IMPRESSION: No mammographic evidence of malignancy. A result letter of this screening mammogram will be mailed directly to the patient. RECOMMENDATION: Screening mammogram in one year. (Code:SM-B-01Y) BI-RADS CATEGORY  1: Negative. Electronically Signed   By: Ammie Ferrier M.D.   On: 09/30/2020 08:18    ASSESSMENT: Anemia, MGUS.  PLAN:    1. Anemia: Patient's hemoglobin has dropped over the past several months and is now 7.6.  She is also symptomatic.  Unclear etiology, but patient does have a progression of her M spike up to 2.5.  Return to clinic tomorrow for 1 unit of packed red blood cells.  Patient has now agreed to repeat bone marrow biopsy.  Return to clinic 1 week after her biopsy to discuss the results. 2.  MGUS:  Patient had a bone survey at an outside facility on February 12, 2017 that did not reveal any evidence of lytic lesions. She had a bone marrow biopsy on February 23, 2017 which flow cytometry revealed 2% monoclonal plasma cells. Immunohistochemical stains revealed scattered focally small clusters of plasma cell approximate 6-13% of total bone marrow cells. These results are consistent with a diagnosis of MGUS.  Her M spike is ranged from 1.6-2.1 since that time, but now has trended up to 2.5 and her IgG component has increased significantly to 3938.  Kappa chains are also trending up and are now 284.0.  Bone marrow biopsy as above.   3. Thrombocytopenia: Platelet count is trending down to 123.  I spent a total of 30 minutes reviewing chart data, face-to-face evaluation with the patient, counseling and coordination of care as detailed above.   Patient expressed understanding and was in agreement with this plan. She also understands that She can call clinic at any time with any questions, concerns, or complaints.    Lloyd Huger, MD   10/25/2020 6:29 AM

## 2020-10-22 ENCOUNTER — Other Ambulatory Visit: Payer: Self-pay | Admitting: *Deleted

## 2020-10-22 ENCOUNTER — Inpatient Hospital Stay (HOSPITAL_BASED_OUTPATIENT_CLINIC_OR_DEPARTMENT_OTHER): Payer: PPO | Admitting: Oncology

## 2020-10-22 ENCOUNTER — Telehealth: Payer: Self-pay | Admitting: *Deleted

## 2020-10-22 ENCOUNTER — Encounter: Payer: Self-pay | Admitting: Oncology

## 2020-10-22 ENCOUNTER — Inpatient Hospital Stay: Payer: PPO | Attending: Oncology

## 2020-10-22 VITALS — BP 120/77 | HR 85 | Temp 97.8°F | Resp 20 | Wt 227.4 lb

## 2020-10-22 DIAGNOSIS — D472 Monoclonal gammopathy: Secondary | ICD-10-CM | POA: Insufficient documentation

## 2020-10-22 DIAGNOSIS — D696 Thrombocytopenia, unspecified: Secondary | ICD-10-CM | POA: Insufficient documentation

## 2020-10-22 DIAGNOSIS — Z86010 Personal history of colonic polyps: Secondary | ICD-10-CM | POA: Insufficient documentation

## 2020-10-22 DIAGNOSIS — Z803 Family history of malignant neoplasm of breast: Secondary | ICD-10-CM | POA: Diagnosis not present

## 2020-10-22 DIAGNOSIS — D649 Anemia, unspecified: Secondary | ICD-10-CM

## 2020-10-22 LAB — CBC WITH DIFFERENTIAL/PLATELET
Abs Immature Granulocytes: 0.02 10*3/uL (ref 0.00–0.07)
Basophils Absolute: 0 10*3/uL (ref 0.0–0.1)
Basophils Relative: 0 %
Eosinophils Absolute: 0.1 10*3/uL (ref 0.0–0.5)
Eosinophils Relative: 2 %
HCT: 24.8 % — ABNORMAL LOW (ref 36.0–46.0)
Hemoglobin: 7.6 g/dL — ABNORMAL LOW (ref 12.0–15.0)
Immature Granulocytes: 0 %
Lymphocytes Relative: 23 %
Lymphs Abs: 1.1 10*3/uL (ref 0.7–4.0)
MCH: 31.8 pg (ref 26.0–34.0)
MCHC: 30.6 g/dL (ref 30.0–36.0)
MCV: 103.8 fL — ABNORMAL HIGH (ref 80.0–100.0)
Monocytes Absolute: 0.3 10*3/uL (ref 0.1–1.0)
Monocytes Relative: 7 %
Neutro Abs: 3.1 10*3/uL (ref 1.7–7.7)
Neutrophils Relative %: 68 %
Platelets: 123 10*3/uL — ABNORMAL LOW (ref 150–400)
RBC: 2.39 MIL/uL — ABNORMAL LOW (ref 3.87–5.11)
RDW: 16.8 % — ABNORMAL HIGH (ref 11.5–15.5)
WBC: 4.6 10*3/uL (ref 4.0–10.5)
nRBC: 0 % (ref 0.0–0.2)

## 2020-10-22 LAB — SAMPLE TO BLOOD BANK

## 2020-10-22 LAB — BASIC METABOLIC PANEL
Anion gap: 4 — ABNORMAL LOW (ref 5–15)
BUN: 18 mg/dL (ref 8–23)
CO2: 24 mmol/L (ref 22–32)
Calcium: 8.5 mg/dL — ABNORMAL LOW (ref 8.9–10.3)
Chloride: 106 mmol/L (ref 98–111)
Creatinine, Ser: 1.26 mg/dL — ABNORMAL HIGH (ref 0.44–1.00)
GFR, Estimated: 43 mL/min — ABNORMAL LOW (ref >60)
Glucose, Bld: 106 mg/dL — ABNORMAL HIGH (ref 70–99)
Potassium: 3.9 mmol/L (ref 3.5–5.1)
Sodium: 134 mmol/L — ABNORMAL LOW (ref 135–145)

## 2020-10-22 LAB — FOLATE: Folate: 15.8 ng/mL (ref >5.9)

## 2020-10-22 NOTE — Telephone Encounter (Signed)
Patient scheduled for bone marrow biopsy on 11/30 at 8:30, patient to arrive at 7:30. Patient informed that IR nurse will reach out as well with further instructions. Patient advised that she will need transportation to and from this appointment.  Can we please schedule patient for follow up 1 week after biopsy?

## 2020-10-23 ENCOUNTER — Inpatient Hospital Stay: Payer: PPO

## 2020-10-23 DIAGNOSIS — D649 Anemia, unspecified: Secondary | ICD-10-CM | POA: Diagnosis not present

## 2020-10-23 DIAGNOSIS — I495 Sick sinus syndrome: Secondary | ICD-10-CM | POA: Diagnosis not present

## 2020-10-23 LAB — IGG, IGA, IGM
IgA: 69 mg/dL (ref 64–422)
IgG (Immunoglobin G), Serum: 3938 mg/dL — ABNORMAL HIGH (ref 586–1602)
IgM (Immunoglobulin M), Srm: 31 mg/dL (ref 26–217)

## 2020-10-23 LAB — PROTEIN ELECTROPHORESIS, SERUM
A/G Ratio: 0.7 (ref 0.7–1.7)
Albumin ELP: 3.5 g/dL (ref 2.9–4.4)
Alpha-1-Globulin: 0.3 g/dL (ref 0.0–0.4)
Alpha-2-Globulin: 0.8 g/dL (ref 0.4–1.0)
Beta Globulin: 0.9 g/dL (ref 0.7–1.3)
Gamma Globulin: 2.9 g/dL — ABNORMAL HIGH (ref 0.4–1.8)
Globulin, Total: 4.8 g/dL — ABNORMAL HIGH (ref 2.2–3.9)
M-Spike, %: 2.5 g/dL — ABNORMAL HIGH
Total Protein ELP: 8.3 g/dL (ref 6.0–8.5)

## 2020-10-23 LAB — KAPPA/LAMBDA LIGHT CHAINS
Kappa free light chain: 284 mg/L — ABNORMAL HIGH (ref 3.3–19.4)
Kappa, lambda light chain ratio: 14.79 — ABNORMAL HIGH (ref 0.26–1.65)
Lambda free light chains: 19.2 mg/L (ref 5.7–26.3)

## 2020-10-23 LAB — PREPARE RBC (CROSSMATCH)

## 2020-10-23 MED ORDER — DIPHENHYDRAMINE HCL 50 MG/ML IJ SOLN
25.0000 mg | Freq: Once | INTRAMUSCULAR | Status: AC
  Administered 2020-10-23: 25 mg via INTRAVENOUS
  Filled 2020-10-23: qty 1

## 2020-10-23 MED ORDER — SODIUM CHLORIDE 0.9% IV SOLUTION
250.0000 mL | Freq: Once | INTRAVENOUS | Status: AC
  Administered 2020-10-23: 250 mL via INTRAVENOUS
  Filled 2020-10-23: qty 250

## 2020-10-23 MED ORDER — ACETAMINOPHEN 325 MG PO TABS
650.0000 mg | ORAL_TABLET | Freq: Once | ORAL | Status: AC
  Administered 2020-10-23: 650 mg via ORAL
  Filled 2020-10-23: qty 2

## 2020-10-23 NOTE — Progress Notes (Signed)
Prior to blood transfusion pt expressed that she has been experiencing SOB with minimal exertion and has notified her MD of concerns. Pt does not seem to be in distress. Pt tolerated blood transfusion well with no complications. VSS prior, during, and post transfusion. RN educated pt on the importance of notifying the clinic if any complications occur at home. Pt verbalized understanding and all questions answered at this time. Pt stable for discharge. Pt discharged home.   Laurice Kimmons [employer]

## 2020-10-24 LAB — TYPE AND SCREEN
ABO/RH(D): O POS
Antibody Screen: NEGATIVE
Unit division: 0

## 2020-10-24 LAB — BPAM RBC
Blood Product Expiration Date: 202112172359
ISSUE DATE / TIME: 202111160936
Unit Type and Rh: 5100

## 2020-10-25 DIAGNOSIS — D649 Anemia, unspecified: Secondary | ICD-10-CM | POA: Diagnosis not present

## 2020-10-25 DIAGNOSIS — I495 Sick sinus syndrome: Secondary | ICD-10-CM | POA: Diagnosis not present

## 2020-10-25 DIAGNOSIS — Z86010 Personal history of colonic polyps: Secondary | ICD-10-CM | POA: Diagnosis not present

## 2020-10-25 DIAGNOSIS — Z9989 Dependence on other enabling machines and devices: Secondary | ICD-10-CM | POA: Diagnosis not present

## 2020-10-25 DIAGNOSIS — G4733 Obstructive sleep apnea (adult) (pediatric): Secondary | ICD-10-CM | POA: Diagnosis not present

## 2020-10-25 DIAGNOSIS — Z7901 Long term (current) use of anticoagulants: Secondary | ICD-10-CM | POA: Diagnosis not present

## 2020-10-25 DIAGNOSIS — R0600 Dyspnea, unspecified: Secondary | ICD-10-CM | POA: Diagnosis not present

## 2020-10-25 DIAGNOSIS — R6881 Early satiety: Secondary | ICD-10-CM | POA: Diagnosis not present

## 2020-10-25 DIAGNOSIS — I482 Chronic atrial fibrillation, unspecified: Secondary | ICD-10-CM | POA: Diagnosis not present

## 2020-10-25 DIAGNOSIS — R195 Other fecal abnormalities: Secondary | ICD-10-CM | POA: Diagnosis not present

## 2020-10-25 DIAGNOSIS — D472 Monoclonal gammopathy: Secondary | ICD-10-CM | POA: Diagnosis not present

## 2020-10-25 DIAGNOSIS — K921 Melena: Secondary | ICD-10-CM | POA: Diagnosis not present

## 2020-10-26 DIAGNOSIS — I482 Chronic atrial fibrillation, unspecified: Secondary | ICD-10-CM | POA: Diagnosis not present

## 2020-10-26 DIAGNOSIS — R059 Cough, unspecified: Secondary | ICD-10-CM | POA: Diagnosis not present

## 2020-10-26 DIAGNOSIS — R942 Abnormal results of pulmonary function studies: Secondary | ICD-10-CM | POA: Diagnosis not present

## 2020-10-26 DIAGNOSIS — R0600 Dyspnea, unspecified: Secondary | ICD-10-CM | POA: Diagnosis not present

## 2020-10-26 DIAGNOSIS — I872 Venous insufficiency (chronic) (peripheral): Secondary | ICD-10-CM | POA: Diagnosis not present

## 2020-10-26 DIAGNOSIS — I10 Essential (primary) hypertension: Secondary | ICD-10-CM | POA: Diagnosis not present

## 2020-10-26 DIAGNOSIS — I2510 Atherosclerotic heart disease of native coronary artery without angina pectoris: Secondary | ICD-10-CM | POA: Diagnosis not present

## 2020-10-26 DIAGNOSIS — E782 Mixed hyperlipidemia: Secondary | ICD-10-CM | POA: Diagnosis not present

## 2020-10-26 DIAGNOSIS — I495 Sick sinus syndrome: Secondary | ICD-10-CM | POA: Diagnosis not present

## 2020-10-26 DIAGNOSIS — I272 Pulmonary hypertension, unspecified: Secondary | ICD-10-CM | POA: Diagnosis not present

## 2020-10-26 DIAGNOSIS — I429 Cardiomyopathy, unspecified: Secondary | ICD-10-CM | POA: Diagnosis not present

## 2020-10-26 DIAGNOSIS — G4733 Obstructive sleep apnea (adult) (pediatric): Secondary | ICD-10-CM | POA: Diagnosis not present

## 2020-10-26 DIAGNOSIS — I071 Rheumatic tricuspid insufficiency: Secondary | ICD-10-CM | POA: Diagnosis not present

## 2020-10-26 DIAGNOSIS — R600 Localized edema: Secondary | ICD-10-CM | POA: Diagnosis not present

## 2020-10-31 ENCOUNTER — Ambulatory Visit: Payer: PPO

## 2020-11-05 ENCOUNTER — Other Ambulatory Visit: Payer: Self-pay | Admitting: Radiology

## 2020-11-06 ENCOUNTER — Ambulatory Visit
Admission: RE | Admit: 2020-11-06 | Discharge: 2020-11-06 | Disposition: A | Discharge status: Home / Self Care | Payer: PPO | Source: Ambulatory Visit | Attending: Oncology | Admitting: Oncology

## 2020-11-06 ENCOUNTER — Other Ambulatory Visit: Payer: Self-pay

## 2020-11-06 ENCOUNTER — Other Ambulatory Visit: Payer: Self-pay | Admitting: Oncology

## 2020-11-06 DIAGNOSIS — D696 Thrombocytopenia, unspecified: Secondary | ICD-10-CM | POA: Insufficient documentation

## 2020-11-06 DIAGNOSIS — D649 Anemia, unspecified: Secondary | ICD-10-CM | POA: Diagnosis not present

## 2020-11-06 DIAGNOSIS — D472 Monoclonal gammopathy: Secondary | ICD-10-CM | POA: Diagnosis not present

## 2020-11-06 DIAGNOSIS — D539 Nutritional anemia, unspecified: Secondary | ICD-10-CM | POA: Insufficient documentation

## 2020-11-06 DIAGNOSIS — D47Z9 Other specified neoplasms of uncertain behavior of lymphoid, hematopoietic and related tissue: Secondary | ICD-10-CM | POA: Diagnosis not present

## 2020-11-06 DIAGNOSIS — C9000 Multiple myeloma not having achieved remission: Secondary | ICD-10-CM | POA: Diagnosis not present

## 2020-11-06 LAB — CBC WITH DIFFERENTIAL/PLATELET
Abs Immature Granulocytes: 0.02 10*3/uL (ref 0.00–0.07)
Basophils Absolute: 0 10*3/uL (ref 0.0–0.1)
Basophils Relative: 0 %
Eosinophils Absolute: 0.1 10*3/uL (ref 0.0–0.5)
Eosinophils Relative: 3 %
HCT: 25.2 % — ABNORMAL LOW (ref 36.0–46.0)
Hemoglobin: 7.7 g/dL — ABNORMAL LOW (ref 12.0–15.0)
Immature Granulocytes: 1 %
Lymphocytes Relative: 25 %
Lymphs Abs: 1 10*3/uL (ref 0.7–4.0)
MCH: 31.7 pg (ref 26.0–34.0)
MCHC: 30.6 g/dL (ref 30.0–36.0)
MCV: 103.7 fL — ABNORMAL HIGH (ref 80.0–100.0)
Monocytes Absolute: 0.4 10*3/uL (ref 0.1–1.0)
Monocytes Relative: 10 %
Neutro Abs: 2.5 10*3/uL (ref 1.7–7.7)
Neutrophils Relative %: 61 %
Platelets: 128 10*3/uL — ABNORMAL LOW (ref 150–400)
RBC: 2.43 MIL/uL — ABNORMAL LOW (ref 3.87–5.11)
RDW: 17.2 % — ABNORMAL HIGH (ref 11.5–15.5)
WBC: 4.1 10*3/uL (ref 4.0–10.5)
nRBC: 0 % (ref 0.0–0.2)

## 2020-11-06 LAB — INTELLIGEN MYELOID

## 2020-11-06 MED ORDER — SODIUM CHLORIDE 0.9 % IV SOLN: INTRAVENOUS | Status: DC

## 2020-11-06 MED ORDER — FENTANYL CITRATE (PF) 100 MCG/2ML IJ SOLN
INTRAMUSCULAR | Status: AC
  Filled 2020-11-06: qty 2

## 2020-11-06 MED ORDER — MIDAZOLAM HCL 2 MG/2ML IJ SOLN
INTRAMUSCULAR | Status: DC | PRN
  Administered 2020-11-06: 1 mg via INTRAVENOUS

## 2020-11-06 MED ORDER — MIDAZOLAM HCL 2 MG/2ML IJ SOLN
INTRAMUSCULAR | Status: AC
  Filled 2020-11-06: qty 2

## 2020-11-06 MED ORDER — HEPARIN SOD (PORK) LOCK FLUSH 100 UNIT/ML IV SOLN
INTRAVENOUS | Status: AC
  Filled 2020-11-06: qty 5

## 2020-11-06 MED ORDER — FENTANYL CITRATE (PF) 100 MCG/2ML IJ SOLN
INTRAMUSCULAR | Status: DC | PRN
Start: 2020-11-06 — End: 2020-11-07
  Administered 2020-11-06 (×2): 50 ug via INTRAVENOUS

## 2020-11-06 NOTE — Consult Note (Signed)
Chief Complaint: Patient was seen in consultation today for CT guided bone marrow biopsy  Referring Physician(s): Finnegan,Timothy J  Supervising Physician: Markus Daft  Patient Status: [facility] - Out-pt  History of Present Illness: Cynthia Wallace is an 84 y.o. female with history of anemia, thrombocytopenia and MGUS who presents today for CT-guided bone marrow biopsy for further evaluation.  Additional medical history as below.  Past Medical History:  Diagnosis Date  . Acid reflux `  . Adenomatous colon polyp   . Anemia   . Arthritis   . Asthma   . Atrial fibrillation ([place])   . Bilateral leg edema   . Chickenpox   . Chronic a-fib ([place])   . Coronary artery disease   . Fibrocystic breast disease   . Hyperlipidemia   . Hypertension   . Iron deficiency anemia   . Osteoporosis   . PUD (peptic ulcer disease)   . Sleep apnea   . Tricuspid valve insufficiency     Past Surgical History:  Procedure Laterality Date  . ABDOMINAL HYSTERECTOMY    . BILATERAL KNEE ARTHROSCOPY    . BREAST EXCISIONAL BIOPSY Right 1990   NEG  . CARDIAC CATHETERIZATION    . COLONOSCOPY WITH PROPOFOL N/A 08/27/2016   Procedure: COLONOSCOPY WITH PROPOFOL;  Surgeon: Manya Silvas, MD;  Location: [facility] ENDOSCOPY;  Service: Endoscopy;  Laterality: N/A;  . INSERT / REPLACE / REMOVE PACEMAKER    . JOINT REPLACEMENT    . PACEMAKER INSERTION    . TUBAL LIGATION      Allergies: Codeine and Penicillin g  Medications: Prior to Admission medications   Medication Sig Start Date End Date Taking? Authorizing Provider  albuterol (PROVENTIL) (2.5 MG/3ML) 0.083% nebulizer solution Inhale into the lungs every 4 (four) hours as needed. 03/13/16  Yes [provider]  atorvastatin (LIPITOR) 10 MG tablet Take 10 mg by mouth.   Yes [provider]  B Complex Vitamins (VITAMIN B COMPLEX PO) Take by mouth.   Yes [provider]  Cholecalciferol (VITAMIN D-1000 MAX ST) 1000 units tablet Take  by mouth.   Yes [provider]  digoxin (LANOXIN) 0.125 MG tablet Take 0.125 mg by mouth daily.    Yes [provider]  fluticasone (FLONASE) 50 MCG/ACT nasal spray SHAKE LIQUID AND USE 2 SPRAYS IN EACH NOSTRIL EVERY DAY AS NEEDED FOR RHINITIS OR ALLERGIES 02/01/18  Yes [provider]  furosemide (LASIX) 20 MG tablet Take 40 mg by mouth daily. Pt to take 2 tablets once a day    Yes [provider]  levocetirizine (XYZAL) 5 MG tablet Take 5 mg by mouth daily. 05/14/20  Yes [provider]  losartan (COZAAR) 50 MG tablet Take 50 mg by mouth daily.   Yes [provider]  Melatonin 10 MG TABS Take 1 tablet by mouth at bedtime as needed. Takes at night    Yes [provider]  metoprolol succinate (TOPROL-XL) 50 MG 24 hr tablet Take 50 mg by mouth 2 (two) times daily.   Yes [provider]  montelukast (SINGULAIR) 10 MG tablet Take 10 mg by mouth at bedtime.   Yes [provider]  potassium chloride (K-DUR) 10 MEQ tablet Take 10 mEq by mouth 3 (three) times daily.    Yes [provider]  rivaroxaban (XARELTO) 20 MG TABS tablet Take 20 mg by mouth daily with supper.   Yes [provider]  traMADol (ULTRAM) 50 MG tablet Take 50 mg by mouth  4 (four) times daily as needed. 03/27/20  Yes [provider]  traZODone (DESYREL) 50 MG tablet Take 50 mg by mouth at bedtime.   Yes [provider]  vitamin B-12 (CYANOCOBALAMIN) 1000 MCG tablet Take by mouth.   Yes [provider]  hydrOXYzine (ATARAX/VISTARIL) 10 MG tablet Take 1 tablet by mouth 3 (three) times daily. 03/31/17   [provider]  pantoprazole (PROTONIX) 40 MG tablet Take 40 mg by mouth daily. 06/15/18 06/15/19  [provider]     Family History  Adopted: Yes  Problem Relation Age of Onset  . Hyperlipidemia Mother   . Hypertension Mother   . Heart disease Mother   . Heart disease Father   . Cancer Sister   .  Breast cancer Sister   . Stroke Sister   . Hyperlipidemia Sister   . Heart disease Sister     Social History   Socioeconomic History  . Marital status: Widowed    Spouse name: Not on file  . Number of children: Not on file  . Years of education: Not on file  . Highest education level: Not on file  Occupational History  . Not on file  Tobacco Use  . Smoking status: Never Smoker  . Smokeless tobacco: Never Used  Substance and Sexual Activity  . Alcohol use: No  . Drug use: No  . Sexual activity: Not on file  Other Topics Concern  . Not on file  Social History Narrative  . Not on file   Social Determinants of Health   Financial Resource Strain:   . Difficulty of Paying Living Expenses: Not on file  Food Insecurity:   . Worried About Charity fundraiser in the Last Year: Not on file  . Ran Out of Food in the Last Year: Not on file  Transportation Needs:   . Lack of Transportation (Medical): Not on file  . Lack of Transportation (Non-Medical): Not on file  Physical Activity:   . Days of Exercise per Week: Not on file  . Minutes of Exercise per Session: Not on file  Stress:   . Feeling of Stress : Not on file  Social Connections:   . Frequency of Communication with Friends and Family: Not on file  . Frequency of Social Gatherings with Friends and Family: Not on file  . Attends Religious Services: Not on file  . Active Member of Clubs or Organizations: Not on file  . Attends Archivist Meetings: Not on file  . Marital Status: Not on file      Review of Systems currently denies fever, headache, chest pain, dyspnea, cough, abdominal/back pain, nausea, vomiting or bleeding  Vital Signs: BP 126/72   Pulse 79   Temp 98.1 F (36.7 C) (Oral)   Resp 20   Ht 5' 5"  (1.651 m)   Wt 220 lb (99.8 kg)   SpO2 99%   BMI 36.61 kg/m   Physical Exam awake, alert.  Chest clear to auscultation bilaterally.  Left chest wall pacer intact.  Heart with normal rate,  irregular rhythm; abdomen soft, positive bowel sounds, nontender.  Bilateral lower extremity edema noted  Imaging: No results found.  Labs:  CBC: Recent Labs    02/17/20 1400 05/08/20 1322 10/22/20 0943  WBC 5.2 5.1 4.6  HGB 8.6* 9.1* 7.6*  HCT 27.8* 29.2* 24.8*  PLT 146* 149* 123*    COAGS: No results for input(s): INR, APTT in the last 8760 hours.  BMP: Recent Labs  02/17/20 1400 05/08/20 1322 10/22/20 0943  NA 137 140 134*  K 4.0 4.1 3.9  CL 104 108 106  CO2 27 27 24   GLUCOSE 82 108* 106*  BUN 23 19 18   CALCIUM 8.9 8.8* 8.5*  CREATININE 1.23* 0.88 1.26*  GFRNONAA 41* >60 43*  GFRAA 48* >60  --     LIVER FUNCTION TESTS: No results for input(s): BILITOT, AST, ALT, ALKPHOS, PROT, ALBUMIN in the last 8760 hours.  TUMOR MARKERS: No results for input(s): AFPTM, CEA, CA199, CHROMGRNA in the last 8760 hours.  Assessment and Plan: 84 y.o. female with history of anemia, thrombocytopenia and MGUS who presents today for CT-guided bone marrow biopsy for further evaluation.Risks and benefits of procedure was discussed with the patient  including, but not limited to bleeding, infection, damage to adjacent structures or low yield requiring additional tests.  All of the questions were answered and there is agreement to proceed.  Consent signed and in chart.     Thank you for this interesting consult.  I greatly enjoyed meeting Coleen TEKELIA KAREEM and look forward to participating in their care.  A copy of this report was sent to the requesting provider on this date.  Electronically Signed: D. Rowe Robert, PA-C 11/06/2020, 8:13 AM   I spent a total of 20 minutes   in face to face in clinical consultation, greater than 50% of which was counseling/coordinating care for CT-guided bone marrow biopsy

## 2020-11-06 NOTE — Progress Notes (Signed)
Patient clinically stable post BMB , tolerated well. Vitals stable pre and post procedure. Denies complaints at this time received Versed 1 mg Along with Fentanyl 100 Mcg IV for procedure. Discharge instructions given to patient and driver with questions answered. Report given to Fransico Michael RN post procedure.

## 2020-11-06 NOTE — Procedures (Signed)
Interventional Radiology Procedure:   Indications: MGUS with anemia and thrombocytopenia.  Procedure: CT guided bone marrow biopsy  Findings: 2 aspirates and 1 core from right ilium  Complications: None     EBL: Minimal, less than 10 ml  Plan: Discharge to home in one hour.   Merina Behrendt R. Anselm Pancoast, MD  Pager: [phone]

## 2020-11-08 LAB — SURGICAL PATHOLOGY

## 2020-11-09 ENCOUNTER — Emergency Department
Admission: EM | Admit: 2020-11-09 | Discharge: 2020-11-09 | Disposition: A | Discharge status: Home / Self Care | Payer: PPO | Attending: Student in an Organized Health Care Education/Training Program | Admitting: Student in an Organized Health Care Education/Training Program

## 2020-11-09 ENCOUNTER — Encounter: Payer: Self-pay | Admitting: Emergency Medicine

## 2020-11-09 ENCOUNTER — Emergency Department: Payer: PPO

## 2020-11-09 ENCOUNTER — Other Ambulatory Visit: Payer: Self-pay

## 2020-11-09 DIAGNOSIS — L03119 Cellulitis of unspecified part of limb: Secondary | ICD-10-CM

## 2020-11-09 DIAGNOSIS — Z7901 Long term (current) use of anticoagulants: Secondary | ICD-10-CM | POA: Insufficient documentation

## 2020-11-09 DIAGNOSIS — L03115 Cellulitis of right lower limb: Secondary | ICD-10-CM | POA: Insufficient documentation

## 2020-11-09 DIAGNOSIS — R609 Edema, unspecified: Secondary | ICD-10-CM

## 2020-11-09 DIAGNOSIS — I4891 Unspecified atrial fibrillation: Secondary | ICD-10-CM | POA: Diagnosis not present

## 2020-11-09 DIAGNOSIS — I2510 Atherosclerotic heart disease of native coronary artery without angina pectoris: Secondary | ICD-10-CM | POA: Diagnosis not present

## 2020-11-09 DIAGNOSIS — R600 Localized edema: Secondary | ICD-10-CM | POA: Diagnosis not present

## 2020-11-09 DIAGNOSIS — I517 Cardiomegaly: Secondary | ICD-10-CM | POA: Diagnosis not present

## 2020-11-09 DIAGNOSIS — Z7951 Long term (current) use of inhaled steroids: Secondary | ICD-10-CM | POA: Insufficient documentation

## 2020-11-09 DIAGNOSIS — L03116 Cellulitis of left lower limb: Secondary | ICD-10-CM | POA: Diagnosis not present

## 2020-11-09 DIAGNOSIS — J45909 Unspecified asthma, uncomplicated: Secondary | ICD-10-CM | POA: Insufficient documentation

## 2020-11-09 DIAGNOSIS — I10 Essential (primary) hypertension: Secondary | ICD-10-CM | POA: Insufficient documentation

## 2020-11-09 DIAGNOSIS — J90 Pleural effusion, not elsewhere classified: Secondary | ICD-10-CM | POA: Diagnosis not present

## 2020-11-09 DIAGNOSIS — J811 Chronic pulmonary edema: Secondary | ICD-10-CM | POA: Diagnosis not present

## 2020-11-09 DIAGNOSIS — Z79899 Other long term (current) drug therapy: Secondary | ICD-10-CM | POA: Insufficient documentation

## 2020-11-09 DIAGNOSIS — M7989 Other specified soft tissue disorders: Secondary | ICD-10-CM | POA: Diagnosis not present

## 2020-11-09 DIAGNOSIS — Z950 Presence of cardiac pacemaker: Secondary | ICD-10-CM | POA: Insufficient documentation

## 2020-11-09 DIAGNOSIS — R2243 Localized swelling, mass and lump, lower limb, bilateral: Secondary | ICD-10-CM | POA: Diagnosis present

## 2020-11-09 LAB — CBC WITH DIFFERENTIAL/PLATELET
Abs Immature Granulocytes: 0.01 10*3/uL (ref 0.00–0.07)
Basophils Absolute: 0 10*3/uL (ref 0.0–0.1)
Basophils Relative: 1 %
Eosinophils Absolute: 0.1 10*3/uL (ref 0.0–0.5)
Eosinophils Relative: 3 %
HCT: 25.9 % — ABNORMAL LOW (ref 36.0–46.0)
Hemoglobin: 8 g/dL — ABNORMAL LOW (ref 12.0–15.0)
Immature Granulocytes: 0 %
Lymphocytes Relative: 28 %
Lymphs Abs: 1.1 10*3/uL (ref 0.7–4.0)
MCH: 32.3 pg (ref 26.0–34.0)
MCHC: 30.9 g/dL (ref 30.0–36.0)
MCV: 104.4 fL — ABNORMAL HIGH (ref 80.0–100.0)
Monocytes Absolute: 0.5 10*3/uL (ref 0.1–1.0)
Monocytes Relative: 12 %
Neutro Abs: 2.2 10*3/uL (ref 1.7–7.7)
Neutrophils Relative %: 56 %
Platelets: 132 10*3/uL — ABNORMAL LOW (ref 150–400)
RBC: 2.48 MIL/uL — ABNORMAL LOW (ref 3.87–5.11)
RDW: 17.2 % — ABNORMAL HIGH (ref 11.5–15.5)
WBC: 3.9 10*3/uL — ABNORMAL LOW (ref 4.0–10.5)
nRBC: 0 % (ref 0.0–0.2)

## 2020-11-09 LAB — COMPREHENSIVE METABOLIC PANEL
ALT: 10 U/L (ref 0–44)
AST: 22 U/L (ref 15–41)
Albumin: 3.7 g/dL (ref 3.5–5.0)
Alkaline Phosphatase: 76 U/L (ref 38–126)
Anion gap: 6 (ref 5–15)
BUN: 22 mg/dL (ref 8–23)
CO2: 25 mmol/L (ref 22–32)
Calcium: 9.3 mg/dL (ref 8.9–10.3)
Chloride: 106 mmol/L (ref 98–111)
Creatinine, Ser: 1.31 mg/dL — ABNORMAL HIGH (ref 0.44–1.00)
GFR, Estimated: 41 mL/min — ABNORMAL LOW (ref >60)
Glucose, Bld: 112 mg/dL — ABNORMAL HIGH (ref 70–99)
Potassium: 4 mmol/L (ref 3.5–5.1)
Sodium: 137 mmol/L (ref 135–145)
Total Bilirubin: 1.4 mg/dL — ABNORMAL HIGH (ref 0.3–1.2)
Total Protein: 9.2 g/dL — ABNORMAL HIGH (ref 6.5–8.1)

## 2020-11-09 MED ORDER — CEPHALEXIN 500 MG PO CAPS
500.0000 mg | ORAL_CAPSULE | Freq: Once | ORAL | Status: AC
  Administered 2020-11-09: 500 mg via ORAL
  Filled 2020-11-09: qty 1

## 2020-11-09 MED ORDER — FUROSEMIDE 10 MG/ML IJ SOLN
40.0000 mg | Freq: Once | INTRAMUSCULAR | Status: AC
  Administered 2020-11-09: 40 mg via INTRAVENOUS
  Filled 2020-11-09: qty 4

## 2020-11-09 MED ORDER — CEPHALEXIN 500 MG PO CAPS: 500.0000 mg | ORAL_CAPSULE | Freq: Two times a day (BID) | ORAL | 0 refills | Status: AC

## 2020-11-09 NOTE — ED Triage Notes (Signed)
Pt comes into the ED via POV c/o fever and bilateral leg swelling.  Pt has baseline edema bilaterally as well as neuropathy but she states that the swelling has gotten worse.  Pt has some discoloration that looks chronic in her lower legs.  PT states she had a back biopsy completed last week and her home health RN came to visit today and took a temporal temperature of 100.4 and told her she should be evaluated.  Pt is afebrile at this time but states she took tylenol at home. Pt denies any pain in her legs today, SHOB, dizziness, and CP.  Pt in NAD with even and unlabored respirations at this time and is ambulatory to triage.

## 2020-11-09 NOTE — ED Notes (Signed)
Patient ambulated in room with O2 saturations at 98%. Patient did states she felt slightly winded while walking.

## 2020-11-09 NOTE — ED Provider Notes (Signed)
[facility] Emergency Department Provider Note    First MD Initiated Contact with Patient 11/09/20 1806     (approximate)  I have reviewed the triage vital signs and the nursing notes.   HISTORY  Chief Complaint Leg Swelling and Fever    HPI Cynthia Wallace is a 84 y.o. female the below listed past medical history presents to the ER for evaluation of some leg swelling as well as reported fever at home.  Had a home health nurse come to visit her for an annual visit.  Was using a infrared thermometer and was measuring from the doorstep said that her temperature was too high for her to come in.  Patient had not been having any fevers or chills.  She states she otherwise feels well but has had some worsening swelling and pain on her feet is worried that she had a skin infection.  She denies any cough or congestion.  No worsening shortness of breath.  No recent antibiotics.  She is on her alto.    Past Medical History:  Diagnosis Date  . Acid reflux `  . Adenomatous colon polyp   . Anemia   . Arthritis   . Asthma   . Atrial fibrillation ([place])   . Bilateral leg edema   . Chickenpox   . Chronic a-fib ([place])   . Coronary artery disease   . Fibrocystic breast disease   . Hyperlipidemia   . Hypertension   . Iron deficiency anemia   . Osteoporosis   . PUD (peptic ulcer disease)   . Sleep apnea   . Tricuspid valve insufficiency    Family History  Adopted: Yes  Problem Relation Age of Onset  . Hyperlipidemia Mother   . Hypertension Mother   . Heart disease Mother   . Heart disease Father   . Cancer Sister   . Breast cancer Sister   . Stroke Sister   . Hyperlipidemia Sister   . Heart disease Sister    Past Surgical History:  Procedure Laterality Date  . ABDOMINAL HYSTERECTOMY    . BILATERAL KNEE ARTHROSCOPY    . BREAST EXCISIONAL BIOPSY Right 1990   NEG  . CARDIAC CATHETERIZATION    . COLONOSCOPY WITH PROPOFOL N/A 08/27/2016   Procedure:  COLONOSCOPY WITH PROPOFOL;  Surgeon: Manya Silvas, MD;  Location: [facility] ENDOSCOPY;  Service: Endoscopy;  Laterality: N/A;  . INSERT / REPLACE / REMOVE PACEMAKER    . JOINT REPLACEMENT    . PACEMAKER INSERTION    . TUBAL LIGATION     Patient Active Problem List   Diagnosis Date Noted  . Combined hyperlipidemia 05/15/2020  . Pure hypercholesterolemia 05/15/2020  . Sick sinus syndrome ([place]) 03/10/2017  . Chronic pulmonary hypertension ([place]) 10/08/2016  . MGUS (monoclonal gammopathy of unknown significance) 09/17/2016  . Thrombocytopenia ([place]) 09/14/2016  . Anemia, unspecified 08/12/2016  . HTN (hypertension) 04/21/2016  . Pneumonia 04/21/2016  . Enlarged heart 04/21/2016  . Severe tricuspid valve insufficiency 03/19/2016  . Hypoxia 03/07/2016  . Left lower lobe pneumonia 03/07/2016  . Dyspnea on exertion 03/07/2016  . Neuropathy 04/24/2015  . Bronchitis 04/10/2015  . Bacteremia 04/02/2015  . Cardiomyopathy ([place]) 04/01/2015  . Paroxysmal atrial fibrillation ([place]) 04/01/2015  . Pelvic pain 04/01/2015  . Localized edema 03/21/2015  . Chronic a-fib ([place]) 02/26/2015  . OSA on CPAP 09/13/2014      Prior to Admission medications   Medication Sig Start Date End Date Taking? Authorizing Provider  albuterol (PROVENTIL) (  2.5 MG/3ML) 0.083% nebulizer solution Inhale into the lungs every 4 (four) hours as needed. 03/13/16   [provider]  atorvastatin (LIPITOR) 10 MG tablet Take 10 mg by mouth.    [provider]  B Complex Vitamins (VITAMIN B COMPLEX PO) Take by mouth.    [provider]  cephALEXin (KEFLEX) 500 MG capsule Take 1 capsule (500 mg total) by mouth 2 (two) times daily for 7 days. 11/09/20 11/16/20  Merlyn Lot, MD  Cholecalciferol (VITAMIN D-1000 MAX ST) 1000 units tablet Take by mouth.    [provider]  digoxin (LANOXIN) 0.125 MG tablet Take 0.125 mg by mouth daily.     [provider]  fluticasone (FLONASE) 50 MCG/ACT  nasal spray SHAKE LIQUID AND USE 2 SPRAYS IN EACH NOSTRIL EVERY DAY AS NEEDED FOR RHINITIS OR ALLERGIES 02/01/18   [provider]  furosemide (LASIX) 20 MG tablet Take 40 mg by mouth daily. Pt to take 2 tablets once a day     [provider]  hydrOXYzine (ATARAX/VISTARIL) 10 MG tablet Take 1 tablet by mouth 3 (three) times daily. 03/31/17   [provider]  levocetirizine (XYZAL) 5 MG tablet Take 5 mg by mouth daily. 05/14/20   [provider]  losartan (COZAAR) 50 MG tablet Take 50 mg by mouth daily.    [provider]  Melatonin 10 MG TABS Take 1 tablet by mouth at bedtime as needed. Takes at night     [provider]  metoprolol succinate (TOPROL-XL) 50 MG 24 hr tablet Take 50 mg by mouth 2 (two) times daily.    [provider]  montelukast (SINGULAIR) 10 MG tablet Take 10 mg by mouth at bedtime.    [provider]  pantoprazole (PROTONIX) 40 MG tablet Take 40 mg by mouth daily. 06/15/18 06/15/19  [provider]  potassium chloride (K-DUR) 10 MEQ tablet Take 10 mEq by mouth 3 (three) times daily.     [provider]  rivaroxaban (XARELTO) 20 MG TABS tablet Take 20 mg by mouth daily with supper.    [provider]  traMADol (ULTRAM) 50 MG tablet Take 50 mg by mouth 4 (four) times daily as needed. 03/27/20   [provider]  traZODone (DESYREL) 50 MG tablet Take 50 mg by mouth at bedtime.    [provider]  vitamin B-12 (CYANOCOBALAMIN) 1000 MCG tablet Take by mouth.    [provider]    Allergies Codeine and Penicillin g    Social History Social History   Tobacco Use  . Smoking status: Never Smoker  . Smokeless tobacco: Never Used  Substance Use Topics  . Alcohol use: No  . Drug use: No    Review of Systems Patient denies headaches, rhinorrhea, blurry vision, numbness, shortness of breath, chest pain, edema, cough, abdominal pain, nausea, vomiting, diarrhea,  dysuria, fevers, rashes or hallucinations unless otherwise stated above in HPI. ____________________________________________   PHYSICAL EXAM:  VITAL SIGNS: Vitals:   11/09/20 2000 11/09/20 2030  BP: (!) 159/67 120/73  Pulse: 77 (!) 59  Resp: (!) 26 18  Temp:    SpO2: 99% 97%    Constitutional: Alert and oriented.  Eyes: Conjunctivae are normal.  Head: Atraumatic. Nose: No congestion/rhinnorhea. Mouth/Throat: Mucous membranes are moist.   Neck: No stridor. Painless ROM.  Cardiovascular: Normal rate, regular rhythm. Grossly normal heart sounds.  Good peripheral circulation. Respiratory: Normal respiratory effort.  No retractions. Lungs with coarse breath sounds bilaterally Gastrointestinal: Soft  and nontender. No distention. No abdominal bruits. No CVA tenderness. Genitourinary: Deferred Musculoskeletal: Chronic venous stasis changes to bilateral lower extremities.  There is some warmth to the dorsum of both feet with very mild erythema.  DP and PT pulses are palpable.  1+ bilateral equal edema.  No joint effusions. Neurologic:  Normal speech and language. No gross focal neurologic deficits are appreciated. No facial droop Skin:  Skin is warm, dry and intact. No rash noted. Psychiatric: Mood and affect are normal. Speech and behavior are normal.  ____________________________________________   LABS (all labs ordered are listed, but only abnormal results are displayed)  Results for orders placed or performed during the hospital encounter of 11/09/20 (from the past 24 hour(s))  Comprehensive metabolic panel     Status: Abnormal   Collection Time: 11/09/20  3:51 PM  Result Value Ref Range   Sodium 137 135 - 145 mmol/L   Potassium 4.0 3.5 - 5.1 mmol/L   Chloride 106 98 - 111 mmol/L   CO2 25 22 - 32 mmol/L   Glucose, Bld 112 (H) 70 - 99 mg/dL   BUN 22 8 - 23 mg/dL   Creatinine, Ser 1.31 (H) 0.44 - 1.00 mg/dL   Calcium 9.3 8.9 - 10.3 mg/dL   Total Protein 9.2 (H) 6.5 - 8.1 g/dL    Albumin 3.7 3.5 - 5.0 g/dL   AST 22 15 - 41 U/L   ALT 10 0 - 44 U/L   Alkaline Phosphatase 76 38 - 126 U/L   Total Bilirubin 1.4 (H) 0.3 - 1.2 mg/dL   GFR, Estimated 41 (L) >60 mL/min   Anion gap 6 5 - 15  CBC with Differential     Status: Abnormal   Collection Time: 11/09/20  3:51 PM  Result Value Ref Range   WBC 3.9 (L) 4.0 - 10.5 K/uL   RBC 2.48 (L) 3.87 - 5.11 MIL/uL   Hemoglobin 8.0 (L) 12.0 - 15.0 g/dL   HCT 25.9 (L) 36 - 46 %   MCV 104.4 (H) 80.0 - 100.0 fL   MCH 32.3 26.0 - 34.0 pg   MCHC 30.9 30.0 - 36.0 g/dL   RDW 17.2 (H) 11.5 - 15.5 %   Platelets 132 (L) 150 - 400 K/uL   nRBC 0.0 0.0 - 0.2 %   Neutrophils Relative % 56 %   Neutro Abs 2.2 1.7 - 7.7 K/uL   Lymphocytes Relative 28 %   Lymphs Abs 1.1 0.7 - 4.0 K/uL   Monocytes Relative 12 %   Monocytes Absolute 0.5 0.1 - 1.0 K/uL   Eosinophils Relative 3 %   Eosinophils Absolute 0.1 0.0 - 0.5 K/uL   Basophils Relative 1 %   Basophils Absolute 0.0 0.0 - 0.1 K/uL   Immature Granulocytes 0 %   Abs Immature Granulocytes 0.01 0.00 - 0.07 K/uL   ____________________________________________   ____________________________________________  RADIOLOGY  I personally reviewed all radiographic images ordered to evaluate for the above acute complaints and reviewed radiology reports and findings.  These findings were personally discussed with the patient.  Please see medical record for radiology report.  ____________________________________________   PROCEDURES  Procedure(s) performed:  Procedures    Critical Care performed: no ____________________________________________   INITIAL IMPRESSION / ASSESSMENT AND PLAN / ED COURSE  Pertinent labs & imaging results that were available during my care of the patient were reviewed by me and considered in my medical decision making (see chart for details).   DDX: Cellulitis, edema, DVT, venous stasis,  CHF  Cynthia Wallace is a 84 y.o. who presents to the ED with  presentation as described above.  She is afebrile here.  Reported elevated temperature was reported with a temporal thermometer therefore question the reliability of the.  Patient states that she also had her thermostat set high.  She does have some edema.  Does have some bibasilar crackles on exam.  Possible creature in fracture worsening CHF or volume overload but she is not hypoxic no significant respiratory symptoms at this time.  She denies any chest pain.  May have component of very mild cellulitis.  She is already on Xarelto therefore does not seem consistent with DVT.  Clinical Course as of Nov 09 2134  Fri Nov 09, 2020  8937 Given patient's patient pulmonary edema on chest x-ray will give Lasix.  I will treat her for very mild cellulitis her feet.  She is otherwise well-appearing therefore she is not hypoxic I think she be appropriate for outpatient follow-up.   [PR]  2131 Patient feeling significantly improved.  Has had adequate diuresis.  She is not hypoxic.  I have instructed her to increase her Lasix over the weekend she has follow-up with her PCP on Monday which I think is reasonable.  She is denying any chest pain.  No shortness of breath at this time.  Instructed to keep her legs elevated.  On further discussion with patient's daughter it seems like the symptoms have been ongoing for about a month now therefore I do not feel that this is a sudden worsening of edema and would be appropriate for outpatient follow-up.  Have discussed with the patient and available family all diagnostics and treatments performed thus far and all questions were answered to the best of my ability. The patient demonstrates understanding and agreement with plan.    [PR]    Clinical Course User Index [PR] Merlyn Lot, MD    The patient was evaluated in Emergency Department today for the symptoms described in the history of present illness. He/she was evaluated in the context of the global COVID-19  pandemic, which necessitated consideration that the patient might be at risk for infection with the SARS-CoV-2 virus that causes COVID-19. Institutional protocols and algorithms that pertain to the evaluation of patients at risk for COVID-19 are in a state of rapid change based on information released by regulatory bodies including the CDC and federal and state organizations. These policies and algorithms were followed during the patient's care in the ED.  As part of my medical decision making, I reviewed the following data within the [facility] notes reviewed and incorporated, Labs reviewed, notes from prior ED visits and [place] Controlled Substance Database   ____________________________________________   FINAL CLINICAL IMPRESSION(S) / ED DIAGNOSES  Final diagnoses:  Peripheral edema  Cellulitis of lower extremity, unspecified laterality      NEW MEDICATIONS STARTED DURING THIS VISIT:  New Prescriptions   CEPHALEXIN (KEFLEX) 500 MG CAPSULE    Take 1 capsule (500 mg total) by mouth 2 (two) times daily for 7 days.     Note:  This document was prepared using Dragon voice recognition software and may include unintentional dictation errors.    Merlyn Lot, MD 11/09/20 2135

## 2020-11-10 NOTE — Progress Notes (Signed)
[facility]  [phone] [phone]  ID: Cynthia Wallace OB: Dec 10, 1935  MR#: [id number]  OJJ#:[id number]  Patient Care Team: Idelle Crouch, MD as PCP - General (Internal Medicine) Lloyd Huger, MD as Consulting Physician (Oncology) Corey Skains, MD as Consulting Physician (Cardiology)   CHIEF COMPLAINT: IgG myeloma.  INTERVAL HISTORY: Patient returns to clinic today for further evaluation, discussion of her bone marrow biopsy results, and treatment planning.  She continues to have weakness and fatigue, but otherwise feels well.  She denies any pain. She has no neurologic complaints. She denies any recent fevers or illnesses. She has a good appetite and denies weight loss. She denies any chest pain, shortness of breath, cough, or hemoptysis.  She denies any nausea, vomiting, constipation, or diarrhea.  She has no urinary complaints.  Patient offers no further specific complaints today.  REVIEW OF SYSTEMS:   Review of Systems  Constitutional: Positive for malaise/fatigue. Negative for fever and weight loss.  Respiratory: Negative.  Negative for cough and shortness of breath.   Cardiovascular: Negative.  Negative for chest pain and leg swelling.  Gastrointestinal: Negative.  Negative for abdominal pain, blood in stool and melena.  Genitourinary: Negative.  Negative for hematuria.  Musculoskeletal: Negative.  Negative for back pain.  Skin: Negative.  Negative for rash.  Neurological: Positive for weakness. Negative for sensory change, focal weakness and headaches.  Endo/Heme/Allergies: Negative.  Does not bruise/bleed easily.  Psychiatric/Behavioral: Negative.  The patient is not nervous/anxious.     As per HPI. Otherwise, a complete review of systems is negative.  PAST MEDICAL HISTORY: Past Medical History:  Diagnosis Date  . Acid reflux `  . Adenomatous colon polyp   . Anemia   . Arthritis   . Asthma   . Atrial fibrillation  ([place])   . Bilateral leg edema   . Chickenpox   . Chronic a-fib ([place])   . Coronary artery disease   . Fibrocystic breast disease   . Hyperlipidemia   . Hypertension   . Iron deficiency anemia   . Osteoporosis   . PUD (peptic ulcer disease)   . Sleep apnea   . Tricuspid valve insufficiency     PAST SURGICAL HISTORY: Past Surgical History:  Procedure Laterality Date  . ABDOMINAL HYSTERECTOMY    . BILATERAL KNEE ARTHROSCOPY    . BREAST EXCISIONAL BIOPSY Right 1990   NEG  . CARDIAC CATHETERIZATION    . COLONOSCOPY WITH PROPOFOL N/A 08/27/2016   Procedure: COLONOSCOPY WITH PROPOFOL;  Surgeon: Manya Silvas, MD;  Location: [facility] ENDOSCOPY;  Service: Endoscopy;  Laterality: N/A;  . INSERT / REPLACE / REMOVE PACEMAKER    . JOINT REPLACEMENT    . PACEMAKER INSERTION    . TUBAL LIGATION      FAMILY HISTORY: Family History  Adopted: Yes  Problem Relation Age of Onset  . Hyperlipidemia Mother   . Hypertension Mother   . Heart disease Mother   . Heart disease Father   . Cancer Sister   . Breast cancer Sister   . Stroke Sister   . Hyperlipidemia Sister   . Heart disease Sister     ADVANCED DIRECTIVES (Y/N):  N  HEALTH MAINTENANCE: Social History   Tobacco Use  . Smoking status: Never Smoker  . Smokeless tobacco: Never Used  Substance Use Topics  . Alcohol use: No  . Drug use: No     Colonoscopy:  PAP:  Bone density:  Lipid panel:  Allergies  Allergen Reactions  . Codeine Nausea Only  . Penicillin G Other (See Comments)    As a child    Current Outpatient Medications  Medication Sig Dispense Refill  . atorvastatin (LIPITOR) 10 MG tablet Take 10 mg by mouth.    . B Complex Vitamins (VITAMIN B COMPLEX PO) Take by mouth.    . cephALEXin (KEFLEX) 500 MG capsule Take 1 capsule (500 mg total) by mouth 2 (two) times daily for 7 days. 14 capsule 0  . Cholecalciferol (VITAMIN D-1000 MAX ST) 1000 units tablet Take by mouth.    . digoxin (LANOXIN) 0.125 MG tablet  Take 0.125 mg by mouth daily.     . fluticasone (FLONASE) 50 MCG/ACT nasal spray SHAKE LIQUID AND USE 2 SPRAYS IN EACH NOSTRIL EVERY DAY AS NEEDED FOR RHINITIS OR ALLERGIES    . furosemide (LASIX) 20 MG tablet Take 40 mg by mouth daily. Pt to take 2 tablets once a day     . hydrOXYzine (ATARAX/VISTARIL) 10 MG tablet Take 1 tablet by mouth 3 (three) times daily.    [place] Kitchen levocetirizine (XYZAL) 5 MG tablet Take 5 mg by mouth daily.    Marland Kitchen losartan (COZAAR) 50 MG tablet Take 50 mg by mouth daily.    . metoprolol succinate (TOPROL-XL) 50 MG 24 hr tablet Take 50 mg by mouth 2 (two) times daily.    . potassium chloride (K-DUR) 10 MEQ tablet Take 10 mEq by mouth 3 (three) times daily.     . rivaroxaban (XARELTO) 20 MG TABS tablet Take 20 mg by mouth daily with supper.    . traMADol (ULTRAM) 50 MG tablet Take 50 mg by mouth 4 (four) times daily as needed.    . traZODone (DESYREL) 50 MG tablet Take 50 mg by mouth at bedtime.    . vitamin B-12 (CYANOCOBALAMIN) 1000 MCG tablet Take by mouth.    Marland Kitchen albuterol (PROVENTIL) (2.5 MG/3ML) 0.083% nebulizer solution Inhale into the lungs every 4 (four) hours as needed. (Patient not taking: Reported on 11/13/2020)    . lenalidomide (REVLIMID) 15 MG capsule Take 1 capsule (15 mg total) by mouth daily. for 21 days with then 7 days off. 21 capsule 5  . Melatonin 10 MG TABS Take 1 tablet by mouth at bedtime as needed. Takes at night  (Patient not taking: Reported on 11/13/2020)    . montelukast (SINGULAIR) 10 MG tablet Take 10 mg by mouth at bedtime. (Patient not taking: Reported on 11/13/2020)    . pantoprazole (PROTONIX) 40 MG tablet Take 40 mg by mouth daily.     No current facility-administered medications for this visit.    OBJECTIVE: Vitals:   11/13/20 1033  BP: (!) 130/94  Pulse: 88  Resp: 20  Temp: (!) 97.2 F (36.2 C)  SpO2: 99%     Body mass index is 36.67 kg/m.    ECOG FS:0 - Asymptomatic  General: Well-developed, well-nourished, no acute distress. Eyes:  Pink conjunctiva, anicteric sclera. HEENT: Normocephalic, moist mucous membranes. Lungs: No audible wheezing or coughing. Heart: Regular rate and rhythm. Abdomen: Soft, nontender, no obvious distention. Musculoskeletal: No edema, cyanosis, or clubbing. Neuro: Alert, answering all questions appropriately. Cranial nerves grossly intact. Skin: No rashes or petechiae noted. Psych: Normal affect.  LAB RESULTS:  Lab Results  Component Value Date   NA 137 11/09/2020   K 4.0 11/09/2020   CL 106 11/09/2020   CO2 25 11/09/2020   GLUCOSE 112 (H) 11/09/2020   BUN 22 11/09/2020   CREATININE  1.31 (H) 11/09/2020   CALCIUM 9.3 11/09/2020   PROT 9.2 (H) 11/09/2020   ALBUMIN 3.7 11/09/2020   AST 22 11/09/2020   ALT 10 11/09/2020   ALKPHOS 76 11/09/2020   BILITOT 1.4 (H) 11/09/2020   GFRNONAA 41 (L) 11/09/2020   GFRAA >60 05/08/2020    Lab Results  Component Value Date   WBC 3.9 (L) 11/09/2020   NEUTROABS 2.2 11/09/2020   HGB 8.0 (L) 11/09/2020   HCT 25.9 (L) 11/09/2020   MCV 104.4 (H) 11/09/2020   PLT 132 (L) 11/09/2020   Lab Results  Component Value Date   TOTALPROTELP 8.3 10/22/2020   ALBUMINELP 3.5 10/22/2020   A1GS 0.3 10/22/2020   A2GS 0.8 10/22/2020   BETS 0.9 10/22/2020   GAMS 2.9 (H) 10/22/2020   MSPIKE 2.5 (H) 10/22/2020   SPEI Comment 10/22/2020     STUDIES: DG Chest Portable 1 View  Result Date: 11/09/2020 CLINICAL DATA:  Leg swelling.  Effusion.  Edema. EXAM: PORTABLE CHEST 1 VIEW COMPARISON:  March 05, 2015 FINDINGS: Cardiomegaly. Bilateral pulmonary opacities suggest pulmonary edema. Stable pacemaker. No pneumothorax. No other acute abnormalities. IMPRESSION: Cardiomegaly and pulmonary edema. Electronically Signed   By: Dorise Bullion III M.D   On: 11/09/2020 19:41   CT BONE MARROW BIOPSY  Result Date: 11/06/2020 INDICATION: 84 year old with monoclonal gammopathy of unknown significance, anemia and thrombocytopenia. EXAM: CT GUIDED BONE MARROW ASPIRATES AND  BIOPSY Physician: Stephan Minister. Anselm Pancoast, MD MEDICATIONS: None. ANESTHESIA/SEDATION: Fentanyl 100 mcg IV; Versed 1.0 mg IV Moderate Sedation Time:  9 minutes The patient was continuously monitored during the procedure by the interventional radiology nurse under my direct supervision. COMPLICATIONS: None immediate. PROCEDURE: The procedure was explained to the patient. The risks and benefits of the procedure were discussed and the patient's questions were addressed. Informed consent was obtained from the patient. The patient was placed prone on CT table. Images of the pelvis were obtained. The right side of back was prepped and draped in sterile fashion. The skin and right posterior ilium were anesthetized with 1% lidocaine. 11 gauge bone needle was directed into the right ilium with CT guidance. Two aspirates and one core biopsy were obtained. Bandage placed over the puncture site. IMPRESSION: CT guided bone marrow aspiration and core biopsy. Electronically Signed   By: Markus Daft M.D.   On: 11/06/2020 09:39    ASSESSMENT: IgG myeloma.  PLAN:    1.  IgG myeloma: She had a bone marrow biopsy on February 23, 2017 which flow cytometry revealed 2% monoclonal plasma cells. Immunohistochemical stains revealed scattered focally small clusters of plasma cell approximate 6-13% of total bone marrow cells.  Her most recent bone marrow biopsy on November 06, 2020 revealed approximately 22% of plasma cells in her bone marrow.  Cytogenetics was reported as normal.  Her M spike is increased to 2.5 and her IgG component is 3938.  Kappa free light chains have also increased to 284.0.  Despite only 22% plasma cells in her bone marrow, her worsening anemia confers endorgan damage.  After lengthy discussion with the patient, she wishes to initiate treatment.  Given her advanced age, will start with single agent Revlimid 20 mg daily for 21 days and 7 days off.  Patient also received dexamethasone 20 mg weekly.  Velcade can be added in later  if patient wishes to be more aggressive.  We will get a repeat metastatic bone survey in the next 1 to 2 weeks for completeness.  She wishes to initiate treatment  after the Christmas holiday, therefore return to clinic in mid January for further evaluation and initiation of Revlimid. 2.  Anemia: Improved to 8.0 with 1 unit of packed red blood cells.  Monitor. 3.  Thrombocytopenia: Chronic and unchanged.  Monitor. 4.  Leukopenia: Mild, monitor. 5.  Chronic renal insufficiency: Chronic and unchanged.  Monitor.   Patient expressed understanding and was in agreement with this plan. She also understands that She can call clinic at any time with any questions, concerns, or complaints.    Lloyd Huger, MD   11/14/2020 5:35 PM

## 2020-11-12 DIAGNOSIS — I517 Cardiomegaly: Secondary | ICD-10-CM | POA: Diagnosis not present

## 2020-11-12 DIAGNOSIS — I509 Heart failure, unspecified: Secondary | ICD-10-CM | POA: Diagnosis not present

## 2020-11-12 DIAGNOSIS — R600 Localized edema: Secondary | ICD-10-CM | POA: Diagnosis not present

## 2020-11-12 DIAGNOSIS — L03115 Cellulitis of right lower limb: Secondary | ICD-10-CM | POA: Diagnosis not present

## 2020-11-12 DIAGNOSIS — J811 Chronic pulmonary edema: Secondary | ICD-10-CM | POA: Diagnosis not present

## 2020-11-12 DIAGNOSIS — J90 Pleural effusion, not elsewhere classified: Secondary | ICD-10-CM | POA: Diagnosis not present

## 2020-11-12 DIAGNOSIS — I5022 Chronic systolic (congestive) heart failure: Secondary | ICD-10-CM | POA: Diagnosis not present

## 2020-11-12 DIAGNOSIS — Z79899 Other long term (current) drug therapy: Secondary | ICD-10-CM | POA: Diagnosis not present

## 2020-11-13 ENCOUNTER — Encounter: Payer: Self-pay | Admitting: Oncology

## 2020-11-13 ENCOUNTER — Inpatient Hospital Stay: Payer: PPO | Attending: Oncology | Admitting: Oncology

## 2020-11-13 ENCOUNTER — Telehealth: Payer: Self-pay | Admitting: Pharmacist

## 2020-11-13 ENCOUNTER — Telehealth: Payer: Self-pay | Admitting: Pharmacy Technician

## 2020-11-13 VITALS — BP 130/94 | HR 88 | Temp 97.2°F | Resp 20 | Wt 220.4 lb

## 2020-11-13 DIAGNOSIS — Z950 Presence of cardiac pacemaker: Secondary | ICD-10-CM | POA: Diagnosis not present

## 2020-11-13 DIAGNOSIS — Z803 Family history of malignant neoplasm of breast: Secondary | ICD-10-CM | POA: Insufficient documentation

## 2020-11-13 DIAGNOSIS — R531 Weakness: Secondary | ICD-10-CM | POA: Diagnosis not present

## 2020-11-13 DIAGNOSIS — D72819 Decreased white blood cell count, unspecified: Secondary | ICD-10-CM | POA: Diagnosis not present

## 2020-11-13 DIAGNOSIS — R5383 Other fatigue: Secondary | ICD-10-CM | POA: Diagnosis not present

## 2020-11-13 DIAGNOSIS — C9000 Multiple myeloma not having achieved remission: Secondary | ICD-10-CM | POA: Insufficient documentation

## 2020-11-13 DIAGNOSIS — Z96653 Presence of artificial knee joint, bilateral: Secondary | ICD-10-CM | POA: Insufficient documentation

## 2020-11-13 DIAGNOSIS — Z90710 Acquired absence of both cervix and uterus: Secondary | ICD-10-CM | POA: Insufficient documentation

## 2020-11-13 DIAGNOSIS — N189 Chronic kidney disease, unspecified: Secondary | ICD-10-CM | POA: Insufficient documentation

## 2020-11-13 DIAGNOSIS — D472 Monoclonal gammopathy: Secondary | ICD-10-CM

## 2020-11-13 DIAGNOSIS — D649 Anemia, unspecified: Secondary | ICD-10-CM | POA: Insufficient documentation

## 2020-11-13 MED ORDER — LENALIDOMIDE 15 MG PO CAPS: 15.0000 mg | ORAL_CAPSULE | Freq: Every day | ORAL | 5 refills | Status: DC

## 2020-11-13 NOTE — Telephone Encounter (Signed)
Oral Oncology Patient Advocate Encounter  Received notification from [employer] that prior authorization for Revlimid is required.  PA submitted on CoverMyMeds Key [id number]  Status is pending  Oral Oncology Clinic will continue to follow.  [place] Patient [place] Phone [phone] Fax [phone] 11/13/2020 2:06 PM

## 2020-11-13 NOTE — Telephone Encounter (Signed)
Oral Oncology Pharmacist Encounter  Received new prescription for Revlimid (lenalidomide) for the treatment of newly diagnosed multiple myeloma in conjunction with dexamethasone, planned duration until disease control, progression, or unacceptable drug toxicity.  CMP from 11/09/20 assessed, SCr 1.31, CrCl ~39 mL/min. Dose reduced for patient's renal impairment. Will continue to monitor Prescription dose and frequency assessed.   Current medication list in Epic reviewed, one DDIs with lenalidomide identified: - Digoxin: lenalidomide may increase the concentration of digoxin. No upfront dose adjustment needed but increased monitoring is needed. Digoxin is being prescribed by patient's PCP Dr. Doy Hutching. Plan on reaching out to PCP office to discuss the need for increase digoxin level monitoring.   Evaluated chart and no patient barriers to medication adherence identified.   Oral Oncology Clinic will continue to follow for insurance authorization, copayment issues, initial counseling and start date.  Darl Pikes, PharmD, BCPS, BCOP, CPP Hematology/Oncology Clinical Pharmacist Practitioner [facility] [phone]  11/13/2020 11:47 AM\

## 2020-11-14 ENCOUNTER — Encounter (HOSPITAL_COMMUNITY): Payer: Self-pay | Admitting: Oncology

## 2020-11-14 NOTE — Telephone Encounter (Signed)
Oral Oncology Patient Advocate Encounter  Prior Authorization for Revlimid has been approved.    PA# [id number] Effective dates: 11/14/20 through 11/14/21  Patients co-pay is $1589.36.  Oral Oncology Clinic will continue to follow.   [place] Patient [address] Phone [phone] Fax [phone] 11/14/2020 10:52 AM

## 2020-11-15 MED ORDER — LENALIDOMIDE 15 MG PO CAPS: 15.0000 mg | ORAL_CAPSULE | Freq: Every day | ORAL | 0 refills | Status: DC

## 2020-11-15 MED ORDER — DEXAMETHASONE 4 MG PO TABS: 20.0000 mg | ORAL_TABLET | ORAL | 3 refills | Status: AC

## 2020-11-16 ENCOUNTER — Encounter (HOSPITAL_COMMUNITY): Payer: Self-pay | Admitting: Oncology

## 2020-11-19 ENCOUNTER — Other Ambulatory Visit
Admission: RE | Admit: 2020-11-19 | Discharge: 2020-11-19 | Disposition: A | Discharge status: Home / Self Care | Payer: PPO | Source: Ambulatory Visit | Attending: Internal Medicine | Admitting: Internal Medicine

## 2020-11-19 ENCOUNTER — Other Ambulatory Visit: Payer: Self-pay

## 2020-11-19 DIAGNOSIS — Z20822 Contact with and (suspected) exposure to covid-19: Secondary | ICD-10-CM | POA: Diagnosis not present

## 2020-11-19 DIAGNOSIS — Z01812 Encounter for preprocedural laboratory examination: Secondary | ICD-10-CM | POA: Diagnosis not present

## 2020-11-20 ENCOUNTER — Encounter: Payer: Self-pay | Admitting: Internal Medicine

## 2020-11-20 LAB — SARS CORONAVIRUS 2 (TAT 6-24 HRS): SARS Coronavirus 2: NEGATIVE

## 2020-11-21 ENCOUNTER — Ambulatory Visit: Payer: PPO | Admitting: Anesthesiology

## 2020-11-21 ENCOUNTER — Ambulatory Visit
Admission: RE | Admit: 2020-11-21 | Discharge: 2020-11-21 | Disposition: A | Discharge status: Home / Self Care | Payer: PPO | Attending: Internal Medicine | Admitting: Internal Medicine

## 2020-11-21 ENCOUNTER — Encounter
Admission: RE | Disposition: A | Discharge status: Home / Self Care | Payer: Self-pay | Source: Home / Self Care | Attending: Internal Medicine

## 2020-11-21 ENCOUNTER — Other Ambulatory Visit: Payer: PPO

## 2020-11-21 ENCOUNTER — Encounter: Payer: Self-pay | Admitting: Internal Medicine

## 2020-11-21 ENCOUNTER — Other Ambulatory Visit: Payer: Self-pay

## 2020-11-21 DIAGNOSIS — Z79899 Other long term (current) drug therapy: Secondary | ICD-10-CM | POA: Insufficient documentation

## 2020-11-21 DIAGNOSIS — K2970 Gastritis, unspecified, without bleeding: Secondary | ICD-10-CM | POA: Diagnosis not present

## 2020-11-21 DIAGNOSIS — K921 Melena: Secondary | ICD-10-CM | POA: Insufficient documentation

## 2020-11-21 DIAGNOSIS — K648 Other hemorrhoids: Secondary | ICD-10-CM | POA: Diagnosis not present

## 2020-11-21 DIAGNOSIS — Z7901 Long term (current) use of anticoagulants: Secondary | ICD-10-CM | POA: Diagnosis not present

## 2020-11-21 DIAGNOSIS — I10 Essential (primary) hypertension: Secondary | ICD-10-CM | POA: Diagnosis not present

## 2020-11-21 DIAGNOSIS — Z8719 Personal history of other diseases of the digestive system: Secondary | ICD-10-CM | POA: Insufficient documentation

## 2020-11-21 DIAGNOSIS — Z86010 Personal history of colonic polyps: Secondary | ICD-10-CM | POA: Insufficient documentation

## 2020-11-21 DIAGNOSIS — R6881 Early satiety: Secondary | ICD-10-CM | POA: Diagnosis not present

## 2020-11-21 DIAGNOSIS — G4733 Obstructive sleep apnea (adult) (pediatric): Secondary | ICD-10-CM | POA: Diagnosis not present

## 2020-11-21 DIAGNOSIS — D500 Iron deficiency anemia secondary to blood loss (chronic): Secondary | ICD-10-CM | POA: Diagnosis not present

## 2020-11-21 DIAGNOSIS — K642 Third degree hemorrhoids: Secondary | ICD-10-CM | POA: Diagnosis not present

## 2020-11-21 DIAGNOSIS — K649 Unspecified hemorrhoids: Secondary | ICD-10-CM | POA: Diagnosis not present

## 2020-11-21 DIAGNOSIS — K5730 Diverticulosis of large intestine without perforation or abscess without bleeding: Secondary | ICD-10-CM | POA: Insufficient documentation

## 2020-11-21 DIAGNOSIS — E7800 Pure hypercholesterolemia, unspecified: Secondary | ICD-10-CM | POA: Diagnosis not present

## 2020-11-21 DIAGNOSIS — K2900 Acute gastritis without bleeding: Secondary | ICD-10-CM | POA: Diagnosis not present

## 2020-11-21 DIAGNOSIS — K5790 Diverticulosis of intestine, part unspecified, without perforation or abscess without bleeding: Secondary | ICD-10-CM | POA: Diagnosis not present

## 2020-11-21 HISTORY — DX: Rheumatic tricuspid insufficiency: I07.1

## 2020-11-21 HISTORY — DX: Monoclonal gammopathy: D47.2

## 2020-11-21 HISTORY — PX: ESOPHAGOGASTRODUODENOSCOPY (EGD) WITH PROPOFOL: SHX5813

## 2020-11-21 HISTORY — DX: Pulmonary hypertension, unspecified: I27.20

## 2020-11-21 HISTORY — DX: Dyspnea, unspecified: R06.00

## 2020-11-21 HISTORY — DX: Localized edema: R60.0

## 2020-11-21 HISTORY — DX: Other forms of dyspnea: R06.09

## 2020-11-21 HISTORY — DX: Polyneuropathy, unspecified: G62.9

## 2020-11-21 HISTORY — DX: Venous insufficiency (chronic) (peripheral): I87.2

## 2020-11-21 HISTORY — DX: Pneumonia, unspecified organism: J18.9

## 2020-11-21 HISTORY — DX: Chronic atrial fibrillation, unspecified: I48.20

## 2020-11-21 HISTORY — PX: COLONOSCOPY WITH PROPOFOL: SHX5780

## 2020-11-21 HISTORY — DX: Pelvic and perineal pain: R10.2

## 2020-11-21 HISTORY — DX: Cardiomyopathy, unspecified: I42.9

## 2020-11-21 HISTORY — DX: Bronchitis, not specified as acute or chronic: J40

## 2020-11-21 HISTORY — DX: Sick sinus syndrome: I49.5

## 2020-11-21 HISTORY — DX: Bacteremia: R78.81

## 2020-11-21 SURGERY — COLONOSCOPY WITH PROPOFOL
Anesthesia: General

## 2020-11-21 MED ORDER — LIDOCAINE HCL (PF) 2 % IJ SOLN
INTRAMUSCULAR | Status: AC
  Filled 2020-11-21: qty 5

## 2020-11-21 MED ORDER — PROPOFOL 10 MG/ML IV BOLUS
INTRAVENOUS | Status: DC | PRN
  Administered 2020-11-21: 20 mg via INTRAVENOUS
  Administered 2020-11-21 (×2): 10 mg via INTRAVENOUS
  Administered 2020-11-21: 20 mg via INTRAVENOUS
  Administered 2020-11-21: 10 mg via INTRAVENOUS

## 2020-11-21 MED ORDER — PROPOFOL 500 MG/50ML IV EMUL
INTRAVENOUS | Status: DC | PRN
  Administered 2020-11-21: 25 ug/kg/min via INTRAVENOUS

## 2020-11-21 MED ORDER — PROPOFOL 500 MG/50ML IV EMUL
INTRAVENOUS | Status: AC
  Filled 2020-11-21: qty 50

## 2020-11-21 MED ORDER — SODIUM CHLORIDE 0.9 % IV SOLN
INTRAVENOUS | Status: DC
  Administered 2020-11-21: 1000 mL via INTRAVENOUS

## 2020-11-21 MED ORDER — LIDOCAINE HCL (PF) 2 % IJ SOLN
INTRAMUSCULAR | Status: DC | PRN
  Administered 2020-11-21: 50 mg

## 2020-11-21 NOTE — H&P (Signed)
Outpatient short stay form Pre-procedure 11/21/2020 10:36 AM Cynthia Wallace, M.D.  Primary Physician: Fulton Reek, M.D.  Reason for visit: Progressive anemia thought secondary to gastrointestinal blood loss, melena, hemoccult positive stool.  History of present illness: AS above. Patient with intermittent "dark stools" without great increase in stool frequency. No abdominal pain, hemetemesis, hematochezia, weight loss or change in bowel habits. Had colonoscopy in 2017 showing multiple tubular adenomatous colon polyps (5) and a single hyperplastic polyp.    Current Facility-Administered Medications:  .  0.9 %  sodium chloride infusion, , Intravenous, Continuous, [place], Benay Pike, MD, Last Rate: 20 mL/hr at 11/21/20 0919, 1,000 mL at 11/21/20 0919  Medications Prior to Admission  Medication Sig Dispense Refill Last Dose  . ascorbic acid (VITAMIN C) 100 MG tablet Take 100 mg by mouth daily.   Past Week at Unknown time  . atorvastatin (LIPITOR) 10 MG tablet Take 10 mg by mouth.   11/21/2020 at 0630  . B Complex Vitamins (VITAMIN B COMPLEX PO) Take by mouth.   Past Week at Unknown time  . Cholecalciferol 25 MCG (1000 UT) tablet Take by mouth.   Past Week at Unknown time  . digoxin (LANOXIN) 0.125 MG tablet Take 0.125 mg by mouth daily.    11/21/2020 at 0630  . esomeprazole (NEXIUM) 10 MG packet Take 10 mg by mouth daily before breakfast.   11/20/2020 at Unknown time  . fluticasone (FLONASE) 50 MCG/ACT nasal spray SHAKE LIQUID AND USE 2 SPRAYS IN EACH NOSTRIL EVERY DAY AS NEEDED FOR RHINITIS OR ALLERGIES   11/20/2020 at Unknown time  . furosemide (LASIX) 20 MG tablet Take 40 mg by mouth daily. Pt to take 2 tablets once a day    11/20/2020 at Unknown time  . hydrOXYzine (ATARAX/VISTARIL) 10 MG tablet Take 1 tablet by mouth 3 (three) times daily.   11/20/2020 at Unknown time  . levocetirizine (XYZAL) 5 MG tablet Take 5 mg by mouth daily.   11/20/2020 at Unknown time  . losartan (COZAAR) 50  MG tablet Take 50 mg by mouth daily.   11/21/2020 at 0630  . metoprolol succinate (TOPROL-XL) 50 MG 24 hr tablet Take 50 mg by mouth 2 (two) times daily.   11/21/2020 at 0630  . montelukast (SINGULAIR) 10 MG tablet Take 10 mg by mouth at bedtime.   11/20/2020 at Unknown time  . potassium chloride (K-DUR) 10 MEQ tablet Take 10 mEq by mouth 3 (three) times daily.    11/21/2020 at 0630  . traZODone (DESYREL) 50 MG tablet Take 50 mg by mouth at bedtime.   11/20/2020 at Unknown time  . vitamin B-12 (CYANOCOBALAMIN) 1000 MCG tablet Take by mouth.   Past Week at Unknown time  . albuterol (PROVENTIL) (2.5 MG/3ML) 0.083% nebulizer solution Inhale into the lungs every 4 (four) hours as needed. (Patient not taking: Reported on 11/13/2020)     . dexamethasone (DECADRON) 4 MG tablet Take 5 tablets (20 mg total) by mouth once a week. Take with food. (Patient not taking: Reported on 11/21/2020) 20 tablet 3 Not Taking at Unknown time  . lenalidomide (REVLIMID) 15 MG capsule Take 1 capsule (15 mg total) by mouth daily. for 21 days with then 7 days off. (Patient not taking: Reported on 11/21/2020) 21 capsule 0 Not Taking at Unknown time  . Melatonin 10 MG TABS Take 1 tablet by mouth at bedtime as needed. Takes at night  (Patient not taking: Reported on 11/13/2020)     . pantoprazole (PROTONIX) 40  MG tablet Take 40 mg by mouth daily.     . rivaroxaban (XARELTO) 20 MG TABS tablet Take 20 mg by mouth daily with supper.   11/17/2020  . traMADol (ULTRAM) 50 MG tablet Take 50 mg by mouth 4 (four) times daily as needed. (Patient not taking: Reported on 11/21/2020)   Not Taking at Unknown time     Allergies  Allergen Reactions  . Codeine Nausea Only  . Penicillin G Other (See Comments)    As a child     Past Medical History:  Diagnosis Date  . Acid reflux `  . Adenomatous colon polyp   . Anemia   . Arthritis   . Asthma   . Atrial fibrillation ([place])   . Bacteremia   . Bilateral leg edema   . Bilateral leg edema    . Bronchitis   . Cardiomyopathy ([place])   . Chickenpox   . Chronic a-fib ([place])   . Chronic atrial fibrillation ([place])   . Chronic pulmonary hypertension ([place])   . Coronary artery disease   . Dyspnea on exertion   . Fibrocystic breast disease   . Hyperlipidemia   . Hypertension   . Iron deficiency anemia   . Localized edema   . MGUS (monoclonal gammopathy of unknown significance)   . Neuropathy   . Osteoporosis   . Pelvic pain   . Pneumonia   . PUD (peptic ulcer disease)   . Severe tricuspid valve insufficiency   . Sick sinus syndrome ([place])   . Sleep apnea   . Tricuspid valve insufficiency   . Venous insufficiency of both lower extremities     Review of systems:  Otherwise negative.    Physical Exam  Gen: Alert, oriented. Appears stated age.  HEENT: [place]/AT. PERRLA. Lungs: CTA, no wheezes. CV: RR nl S1, S2. Abd: soft, benign, no masses. BS+ Ext: No edema. Pulses 2+    Planned procedures: Proceed with EGD and colonoscopy. The patient understands the nature of the planned procedure, indications, risks, alternatives and potential complications including but not limited to bleeding, infection, perforation, damage to internal organs and possible oversedation/side effects from anesthesia. The patient agrees and gives consent to proceed.  Please refer to procedure notes for findings, recommendations and patient disposition/instructions.     Mychal Decarlo K. Alice Wallace, M.D. Gastroenterology 11/21/2020  10:36 AM

## 2020-11-21 NOTE — Anesthesia Preprocedure Evaluation (Signed)
Anesthesia Evaluation  Patient identified by MRN, date of birth, ID band Patient awake    Reviewed: Allergy & Precautions, H&P , NPO status , Patient's Chart, lab work & pertinent test results, reviewed documented beta blocker date and time   Airway Mallampati: II   Neck ROM: full    Dental  (+) Poor Dentition   Pulmonary shortness of breath and with exertion, asthma , sleep apnea and Continuous Positive Airway Pressure Ventilation , pneumonia, resolved,    Pulmonary exam normal        Cardiovascular Exercise Tolerance: Poor hypertension, On Medications + CAD  Normal cardiovascular exam Rhythm:regular Rate:Normal     Neuro/Psych negative neurological ROS  negative psych ROS   GI/Hepatic Neg liver ROS, PUD, GERD  Medicated,  Endo/Other  negative endocrine ROS  Renal/GU negative Renal ROS  negative genitourinary   Musculoskeletal   Abdominal   Peds  Hematology  (+) Blood dyscrasia, anemia ,   Anesthesia Other Findings Past Medical History: `: Acid reflux No date: Adenomatous colon polyp No date: Anemia No date: Arthritis No date: Asthma No date: Atrial fibrillation (HCC) No date: Bacteremia No date: Bilateral leg edema No date: Bilateral leg edema No date: Bronchitis No date: Cardiomyopathy ([place]) No date: Chickenpox No date: Chronic a-fib (HCC) No date: Chronic atrial fibrillation (HCC) No date: Chronic pulmonary hypertension (HCC) No date: Coronary artery disease No date: Dyspnea on exertion No date: Fibrocystic breast disease No date: Hyperlipidemia No date: Hypertension No date: Iron deficiency anemia No date: Localized edema No date: MGUS (monoclonal gammopathy of unknown significance) No date: Neuropathy No date: Osteoporosis No date: Pelvic pain No date: Pneumonia No date: PUD (peptic ulcer disease) No date: Severe tricuspid valve insufficiency No date: Sick sinus syndrome (HCC) No date:  Sleep apnea No date: Tricuspid valve insufficiency No date: Venous insufficiency of both lower extremities Past Surgical History: No date: ABDOMINAL HYSTERECTOMY No date: BILATERAL KNEE ARTHROSCOPY 1990: BREAST EXCISIONAL BIOPSY; Right     Comment:  NEG No date: BREAST SURGERY No date: CARDIAC CATHETERIZATION 08/27/2016: COLONOSCOPY WITH PROPOFOL; N/A     Comment:  Procedure: COLONOSCOPY WITH PROPOFOL;  Surgeon: Manya Silvas, MD;  Location[facility] ENDOSCOPY;  Service:               Endoscopy;  Laterality: N/A; No date: INSERT / REPLACE / REMOVE PACEMAKER No date: JOINT REPLACEMENT No date: PACEMAKER INSERTION No date: TUBAL LIGATION BMI    Body Mass Index: 33.74 kg/m     Reproductive/Obstetrics negative OB ROS                             Anesthesia Physical Anesthesia Plan  ASA: IV  Anesthesia Plan: General   Post-op Pain Management:    Induction:   PONV Risk Score and Plan:   Airway Management Planned:   Additional Equipment:   Intra-op Plan:   Post-operative Plan:   Informed Consent: I have reviewed the patients History and Physical, chart, labs and discussed the procedure including the risks, benefits and alternatives for the proposed anesthesia with the patient or authorized representative who has indicated his/her understanding and acceptance.     Dental Advisory Given  Plan Discussed with: CRNA  Anesthesia Plan Comments:         Anesthesia Quick Evaluation

## 2020-11-21 NOTE — Op Note (Signed)
[facility] Gastroenterology Patient Name: Cynthia Wallace Procedure Date: 11/21/2020 10:36 AM MRN: [id number] Account #: [id number] Date of Birth: [birth year] Admit Type: Outpatient Age: 84 Room: [facility] ENDO ROOM 2 Gender: Female Note Status: Finalized Procedure:             Colonoscopy Indications:           Iron deficiency anemia secondary to chronic blood loss Providers:             Benay Pike. Alice Reichert MD, MD Referring MD:          Leonie Douglas. Doy Hutching, MD (Referring MD) Medicines:             Propofol per Anesthesia Complications:         No immediate complications. Procedure:             Pre-Anesthesia Assessment:                        - The risks and benefits of the procedure and the                         sedation options and risks were discussed with the                         patient. All questions were answered and informed                         consent was obtained.                        - Patient identification and proposed procedure were                         verified prior to the procedure by the nurse. The                         procedure was verified in the procedure room.                        - ASA Grade Assessment: III - A patient with severe                         systemic disease.                        - After reviewing the risks and benefits, the patient                         was deemed in satisfactory condition to undergo the                         procedure.                        After obtaining informed consent, the colonoscope was                         passed under direct vision. Throughout the procedure,                         the  patient's blood pressure, pulse, and oxygen                         saturations were monitored continuously. The                         Colonoscope was introduced through the anus and                         advanced to the the cecum, identified by appendiceal                         orifice and  ileocecal valve. The colonoscopy was                         performed without difficulty. The patient tolerated                         the procedure well. The quality of the bowel                         preparation was good. The ileocecal valve, appendiceal                         orifice, and rectum were photographed. Findings:      The perianal exam findings include internal hemorrhoids that prolapse       with straining, but require manual replacement into the anal canal       (Grade III).      Non-bleeding internal hemorrhoids were found during retroflexion. The       hemorrhoids were Grade III (internal hemorrhoids that prolapse but       require manual reduction).      Many small-mouthed diverticula were found in the sigmoid colon.      The exam was otherwise without abnormality. Impression:            - Internal hemorrhoids that prolapse with straining,                         but require manual replacement into the anal canal                         (Grade III) found on perianal exam.                        - Non-bleeding internal hemorrhoids.                        - Diverticulosis in the sigmoid colon.                        - The examination was otherwise normal.                        - No specimens collected. Recommendation:        - Await pathology results from EGD, also performed                         today.                        -  Patient has a contact number available for                         emergencies. The signs and symptoms of potential                         delayed complications were discussed with the patient.                         Return to normal activities tomorrow. Written                         discharge instructions were provided to the patient.                        - Resume previous diet.                        - Continue present medications.                        - No repeat colonoscopy due to current age (81 years                          or older) and the absence of colonic polyps.                        - To visualize the small bowel, perform video capsule                         endoscopy at appointment to be scheduled. Procedure Code(s):     --- Professional ---                        [phone], Colonoscopy, flexible; diagnostic, including                         collection of specimen(s) by brushing or washing, when                         performed (separate procedure) Diagnosis Code(s):     --- Professional ---                        K57.30, Diverticulosis of large intestine without                         perforation or abscess without bleeding                        D50.0, Iron deficiency anemia secondary to blood loss                         (chronic)                        K64.2, Third degree hemorrhoids CPT copyright 2019 American Medical Association. All rights reserved. The codes documented in this report are preliminary and upon coder review may  be revised to meet current compliance requirements. Efrain Sella MD, MD 11/21/2020 11:18:19 AM This report has been  signed electronically. Number of Addenda: 0 Note Initiated On: 11/21/2020 10:36 AM Scope Withdrawal Time: 0 hours 4 minutes 26 seconds  Total Procedure Duration: 0 hours 8 minutes 52 seconds  Estimated Blood Loss:  Estimated blood loss: none.      [facility]

## 2020-11-21 NOTE — Interval H&P Note (Signed)
History and Physical Interval Note:  11/21/2020 10:39 AM  Cynthia Wallace  has presented today for surgery, with the diagnosis of EARLY SATIETY MELENA ANEMIA HX ADEN POLYPS.  The various methods of treatment have been discussed with the patient and family. After consideration of risks, benefits and other options for treatment, the patient has consented to  Procedure(s): COLONOSCOPY WITH PROPOFOL (N/A) ESOPHAGOGASTRODUODENOSCOPY (EGD) WITH PROPOFOL (N/A) as a surgical intervention.  The patient's history has been reviewed, patient examined, no change in status, stable for surgery.  I have reviewed the patient's chart and labs.  Questions were answered to the patient's satisfaction.     [place], [place]

## 2020-11-21 NOTE — Transfer of Care (Signed)
Immediate Anesthesia Transfer of Care Note  Patient: Cynthia Wallace  Procedure(s) Performed: COLONOSCOPY WITH PROPOFOL (N/A ) ESOPHAGOGASTRODUODENOSCOPY (EGD) WITH PROPOFOL (N/A )  Patient Location: PACU  Anesthesia Type:General  Level of Consciousness: sedated  Airway & Oxygen Therapy: Patient Spontanous Breathing  Post-op Assessment: Report given to RN and Post -op Vital signs reviewed and stable  Post vital signs: Reviewed and stable  Last Vitals:  Vitals Value Taken Time  BP 112/66 11/21/20 1118  Temp 36.4 C 11/21/20 1118  Pulse 71 11/21/20 1119  Resp 25 11/21/20 1119  SpO2 98 % 11/21/20 1119  Vitals shown include unvalidated device data.  Last Pain:  Vitals:   11/21/20 1118  TempSrc:   PainSc: 0-No pain         Complications: No complications documented.

## 2020-11-21 NOTE — Op Note (Signed)
[facility] Gastroenterology Patient Name: Cynthia Wallace Procedure Date: 11/21/2020 10:37 AM MRN: [id number] Account #: [id number] Date of Birth: 06/21/1936 Admit Type: Outpatient Age: 84 Room: [facility] ENDO ROOM 2 Gender: Female Note Status: Finalized Procedure:             Upper GI endoscopy Indications:           Iron deficiency anemia secondary to chronic blood                         loss, Heme positive stool, Melena Providers:             Benay Pike. Alice Reichert MD, MD Referring MD:          Leonie Douglas. Doy Hutching, MD (Referring MD) Medicines:             Propofol per Anesthesia Complications:         No immediate complications. Procedure:             Pre-Anesthesia Assessment:                        - The risks and benefits of the procedure and the                         sedation options and risks were discussed with the                         patient. All questions were answered and informed                         consent was obtained.                        - Patient identification and proposed procedure were                         verified prior to the procedure by the nurse. The                         procedure was verified in the procedure room.                        - ASA Grade Assessment: III - A patient with severe                         systemic disease.                        - After reviewing the risks and benefits, the patient                         was deemed in satisfactory condition to undergo the                         procedure.                        After obtaining informed consent, the endoscope was                         passed under  direct vision. Throughout the procedure,                         the patient's blood pressure, pulse, and oxygen                         saturations were monitored continuously. The Endoscope                         was introduced through the mouth, and advanced to the                         third part of  duodenum. The upper GI endoscopy was                         accomplished without difficulty. The patient tolerated                         the procedure well. Findings:      The esophagus was normal.      Scattered mild inflammation characterized by erosions was found in the       entire examined stomach.      The examined duodenum was normal.      The exam was otherwise without abnormality. Impression:            - Normal esophagus.                        - Gastritis.                        - Normal examined duodenum.                        - The examination was otherwise normal.                        - No specimens collected. Recommendation:        - Proceed with colonoscopy Procedure Code(s):     --- Professional ---                        [phone], Esophagogastroduodenoscopy, flexible,                         transoral; diagnostic, including collection of                         specimen(s) by brushing or washing, when performed                         (separate procedure) Diagnosis Code(s):     --- Professional ---                        K92.1, Melena (includes Hematochezia)                        R19.5, Other fecal abnormalities                        D50.0, Iron deficiency anemia secondary to blood loss                         (  chronic)                        K29.70, Gastritis, unspecified, without bleeding CPT copyright 2019 American Medical Association. All rights reserved. The codes documented in this report are preliminary and upon coder review may  be revised to meet current compliance requirements. Efrain Sella MD, MD 11/21/2020 11:03:37 AM This report has been signed electronically. Number of Addenda: 0 Note Initiated On: 11/21/2020 10:37 AM Estimated Blood Loss:  Estimated blood loss: none.      [facility]

## 2020-11-22 ENCOUNTER — Encounter: Payer: Self-pay | Admitting: Internal Medicine

## 2020-11-22 ENCOUNTER — Telehealth: Payer: PPO | Admitting: Oncology

## 2020-11-22 DIAGNOSIS — R0600 Dyspnea, unspecified: Secondary | ICD-10-CM | POA: Diagnosis not present

## 2020-11-22 DIAGNOSIS — Z01818 Encounter for other preprocedural examination: Secondary | ICD-10-CM | POA: Diagnosis not present

## 2020-12-03 NOTE — Anesthesia Postprocedure Evaluation (Signed)
Anesthesia Post Note  Patient: Cynthia Wallace  Procedure(s) Performed: COLONOSCOPY WITH PROPOFOL (N/A ) ESOPHAGOGASTRODUODENOSCOPY (EGD) WITH PROPOFOL (N/A )  Patient location during evaluation: PACU Anesthesia Type: General Level of consciousness: awake and alert Pain management: pain level controlled Vital Signs Assessment: post-procedure vital signs reviewed and stable Respiratory status: spontaneous breathing, nonlabored ventilation, respiratory function stable and patient connected to nasal cannula oxygen Cardiovascular status: blood pressure returned to baseline and stable Postop Assessment: no apparent nausea or vomiting Anesthetic complications: no   No complications documented.   Last Vitals:  Vitals:   11/21/20 1118 11/21/20 1128  BP: 112/66 (!) 138/99  Pulse: 71 74  Resp: 18 15  Temp: 36.4 C   SpO2: 99% 99%    Last Pain:  Vitals:   11/21/20 1128  TempSrc:   PainSc: 0-No pain                 Yevette Edwards

## 2020-12-06 NOTE — Telephone Encounter (Signed)
Medication delivered to patient by [employer] on 12/04/20.  Daine Floras CPHT [facility] Patient [facility] Phone [phone] Fax [phone] 12/06/2020 12:10 PM

## 2020-12-12 ENCOUNTER — Encounter (INDEPENDENT_AMBULATORY_CARE_PROVIDER_SITE_OTHER): Payer: Self-pay | Admitting: Nurse Practitioner

## 2020-12-12 NOTE — Telephone Encounter (Signed)
Oral Chemotherapy Pharmacist Encounter   Attempted to call Dr. Judithann Sheen office on 12/11/20 and 12/12/20 to notify them of the digoxin DDI. Unable to reach staff or leave message. Attempted to fax the office on 12/13/19 using 2 available fax numbers. Fax lines were busy.   Walked over DDI information to the [facility] and handed the fax to the front desk staff with the request that the information be passed to Dr. Judithann Sheen or his nurse.  Remi Haggard, PharmD, BCPS, BCOP, CPP Hematology/Oncology Clinical Pharmacist [facility] Oral Chemotherapy [facility] [phone]  12/12/2020 12:23 PM

## 2020-12-15 NOTE — Progress Notes (Signed)
[facility]  [phone] [phone]  ID: Cynthia Wallace OB: 07/28/1936  MR#: [id number]  CSN#:[id number]  Patient Care Team: Sparks, Jeffrey D, MD as PCP - General (Internal Medicine) Finnegan, Timothy J, MD as Consulting Physician (Oncology) Kowalski, Bruce J, MD as Consulting Physician (Cardiology)   CHIEF COMPLAINT: IgG myeloma.  INTERVAL HISTORY: Patient returns to clinic today for further evaluation and initiation of treatment with single agent Revlimid.  She continues to have weakness and fatigue, but otherwise feels well.  She denies any pain. She has no neurologic complaints. She denies any recent fevers or illnesses. She has a good appetite and denies weight loss. She denies any chest pain, shortness of breath, cough, or hemoptysis.  She denies any nausea, vomiting, constipation, or diarrhea.  She has no urinary complaints.  Patient offers no further specific complaints today.  REVIEW OF SYSTEMS:   Review of Systems  Constitutional: Positive for malaise/fatigue. Negative for fever and weight loss.  Respiratory: Negative.  Negative for cough and shortness of breath.   Cardiovascular: Negative.  Negative for chest pain and leg swelling.  Gastrointestinal: Negative.  Negative for abdominal pain, blood in stool and melena.  Genitourinary: Negative.  Negative for hematuria.  Musculoskeletal: Negative.  Negative for back pain.  Skin: Negative.  Negative for rash.  Neurological: Positive for weakness. Negative for sensory change, focal weakness and headaches.  Endo/Heme/Allergies: Negative.  Does not bruise/bleed easily.  Psychiatric/Behavioral: Negative.  The patient is not nervous/anxious.     As per HPI. Otherwise, a complete review of systems is negative.  PAST MEDICAL HISTORY: Past Medical History:  Diagnosis Date  . Acid reflux `  . Adenomatous colon polyp   . Anemia   . Arthritis   . Asthma   . Atrial fibrillation (HCC)   .  Bacteremia   . Bilateral leg edema   . Bilateral leg edema   . Bronchitis   . Cardiomyopathy (HCC)   . Chickenpox   . Chronic a-fib (HCC)   . Chronic atrial fibrillation (HCC)   . Chronic pulmonary hypertension (HCC)   . Coronary artery disease   . Dyspnea on exertion   . Fibrocystic breast disease   . Hyperlipidemia   . Hypertension   . Iron deficiency anemia   . Localized edema   . MGUS (monoclonal gammopathy of unknown significance)   . Neuropathy   . Osteoporosis   . Pelvic pain   . Pneumonia   . PUD (peptic ulcer disease)   . Severe tricuspid valve insufficiency   . Sick sinus syndrome (HCC)   . Sleep apnea   . Tricuspid valve insufficiency   . Venous insufficiency of both lower extremities     PAST SURGICAL HISTORY: Past Surgical History:  Procedure Laterality Date  . ABDOMINAL HYSTERECTOMY    . BILATERAL KNEE ARTHROSCOPY    . BREAST EXCISIONAL BIOPSY Right 1990   NEG  . BREAST SURGERY    . CARDIAC CATHETERIZATION    . COLONOSCOPY WITH PROPOFOL N/A 08/27/2016   Procedure: COLONOSCOPY WITH PROPOFOL;  Surgeon: Robert T Elliott, MD;  Location: [facility] ENDOSCOPY;  Service: Endoscopy;  Laterality: N/A;  . COLONOSCOPY WITH PROPOFOL N/A 11/21/2020   Procedure: COLONOSCOPY WITH PROPOFOL;  Surgeon: Toledo, Teodoro K, MD;  Location: [facility] ENDOSCOPY;  Service: Gastroenterology;  Laterality: N/A;  . ESOPHAGOGASTRODUODENOSCOPY (EGD) WITH PROPOFOL N/A 11/21/2020   Procedure: ESOPHAGOGASTRODUODENOSCOPY (EGD) WITH PROPOFOL;  Surgeon: Toledo, Teodoro K, MD;  Location: [facility] ENDOSCOPY;  Service: Gastroenterology;  Laterality:   N/A;  . INSERT / REPLACE / REMOVE PACEMAKER    . JOINT REPLACEMENT    . PACEMAKER INSERTION    . TUBAL LIGATION      FAMILY HISTORY: Family History  Adopted: Yes  Problem Relation Age of Onset  . Hyperlipidemia Mother   . Hypertension Mother   . Heart disease Mother   . Heart disease Father   . Cancer Sister   . Breast cancer Sister   . Stroke Sister    . Hyperlipidemia Sister   . Heart disease Sister     ADVANCED DIRECTIVES (Y/N):  N  HEALTH MAINTENANCE: Social History   Tobacco Use  . Smoking status: Never Smoker  . Smokeless tobacco: Never Used  Vaping Use  . Vaping Use: Never used  Substance Use Topics  . Alcohol use: No  . Drug use: No     Colonoscopy:  PAP:  Bone density:  Lipid panel:  Allergies  Allergen Reactions  . Codeine Nausea Only  . Penicillin G Other (See Comments)    As a child    Current Outpatient Medications  Medication Sig Dispense Refill  . albuterol (PROVENTIL) (2.5 MG/3ML) 0.083% nebulizer solution Inhale into the lungs every 4 (four) hours as needed.    . ascorbic acid (VITAMIN C) 100 MG tablet Take 100 mg by mouth daily.    . atorvastatin (LIPITOR) 10 MG tablet Take 10 mg by mouth.    . B Complex Vitamins (VITAMIN B COMPLEX PO) Take by mouth.    . Cholecalciferol 25 MCG (1000 UT) tablet Take by mouth.    . dexamethasone (DECADRON) 4 MG tablet Take 5 tablets (20 mg total) by mouth once a week. Take with food. 20 tablet 3  . digoxin (LANOXIN) 0.125 MG tablet Take 0.125 mg by mouth daily.     . esomeprazole (NEXIUM) 10 MG packet Take 10 mg by mouth daily before breakfast.    . fluticasone (FLONASE) 50 MCG/ACT nasal spray SHAKE LIQUID AND USE 2 SPRAYS IN EACH NOSTRIL EVERY DAY AS NEEDED FOR RHINITIS OR ALLERGIES    . furosemide (LASIX) 20 MG tablet Take 40 mg by mouth daily. Pt to take 2 tablets once a day     . hydrOXYzine (ATARAX/VISTARIL) 10 MG tablet Take 1 tablet by mouth 3 (three) times daily.    . levocetirizine (XYZAL) 5 MG tablet Take 5 mg by mouth daily.    . losartan (COZAAR) 50 MG tablet Take 50 mg by mouth daily.    . Melatonin 10 MG TABS Take 1 tablet by mouth at bedtime as needed. Takes at night    . metoprolol succinate (TOPROL-XL) 50 MG 24 hr tablet Take 50 mg by mouth 2 (two) times daily.    . montelukast (SINGULAIR) 10 MG tablet Take 10 mg by mouth at bedtime.    .  pantoprazole (PROTONIX) 40 MG tablet Take 40 mg by mouth daily.    . potassium chloride (K-DUR) 10 MEQ tablet Take 10 mEq by mouth 3 (three) times daily.     . rivaroxaban (XARELTO) 20 MG TABS tablet Take 20 mg by mouth daily with supper.    . traMADol (ULTRAM) 50 MG tablet Take 50 mg by mouth 4 (four) times daily as needed.    . traZODone (DESYREL) 50 MG tablet Take 50 mg by mouth at bedtime.    . vitamin B-12 (CYANOCOBALAMIN) 1000 MCG tablet Take by mouth.    . lenalidomide (REVLIMID) 15 MG capsule Take 1 capsule (15   mg total) by mouth daily. for 21 days with then 7 days off. (Patient not taking: Reported on 12/18/2020) 21 capsule 0   No current facility-administered medications for this visit.    OBJECTIVE: Vitals:   12/18/20 1440  BP: (!) 130/59  Pulse: 85  Temp: 97.8 F (36.6 C)  SpO2: 100%     Body mass index is 32.87 kg/m.    ECOG FS:0 - Asymptomatic  General: Well-developed, well-nourished, no acute distress. Eyes: Pink conjunctiva, anicteric sclera. HEENT: Normocephalic, moist mucous membranes. Lungs: No audible wheezing or coughing. Heart: Regular rate and rhythm. Abdomen: Soft, nontender, no obvious distention. Musculoskeletal: No edema, cyanosis, or clubbing. Neuro: Alert, answering all questions appropriately. Cranial nerves grossly intact. Skin: No rashes or petechiae noted. Psych: Normal affect.  LAB RESULTS:  Lab Results  Component Value Date   NA 134 (L) 12/18/2020   K 3.7 12/18/2020   CL 102 12/18/2020   CO2 25 12/18/2020   GLUCOSE 99 12/18/2020   BUN 39 (H) 12/18/2020   CREATININE 1.71 (H) 12/18/2020   CALCIUM 9.1 12/18/2020   PROT 9.2 (H) 11/09/2020   ALBUMIN 3.7 11/09/2020   AST 22 11/09/2020   ALT 10 11/09/2020   ALKPHOS 76 11/09/2020   BILITOT 1.4 (H) 11/09/2020   GFRNONAA 30 (L) 12/18/2020   GFRAA >60 05/08/2020    Lab Results  Component Value Date   WBC 5.0 12/18/2020   NEUTROABS 3.3 12/18/2020   HGB 8.1 (L) 12/18/2020   HCT 25.9 (L)  12/18/2020   MCV 104.0 (H) 12/18/2020   PLT 139 (L) 12/18/2020   Lab Results  Component Value Date   TOTALPROTELP 8.3 10/22/2020   ALBUMINELP 3.5 10/22/2020   A1GS 0.3 10/22/2020   A2GS 0.8 10/22/2020   BETS 0.9 10/22/2020   GAMS 2.9 (H) 10/22/2020   MSPIKE 2.5 (H) 10/22/2020   SPEI Comment 10/22/2020     STUDIES: No results found.  ASSESSMENT: IgG myeloma.  PLAN:    1.  IgG myeloma: She had a bone marrow biopsy on February 23, 2017 which flow cytometry revealed 2% monoclonal plasma cells. Immunohistochemical stains revealed scattered focally small clusters of plasma cell approximate 6-13% of total bone marrow cells.  Her most recent bone marrow biopsy on November 06, 2020 revealed approximately 22% of plasma cells in her bone marrow.  Cytogenetics was reported as normal.  Her M spike is increased to 2.5 and her IgG component is 3938.  Kappa free light chains have also increased to 284.0.  Despite only 22% plasma cells in her bone marrow, her worsening anemia confers endorgan damage.  After lengthy discussion with the patient, she wishes to initiate treatment.  Given her advanced age, will start with single agent Revlimid 20 mg daily for 21 days and 7 days off.  Patient also received dexamethasone 20 mg weekly.  Velcade can be added in later if patient wishes to be more aggressive.  Patient will require a metastatic bone survey in the near future.  Proceed with treatment today.  Return to clinic in 4 weeks for repeat laboratory work, further evaluation, and continuation of treatment.  Appreciate clinical pharmacy input.   2.  Anemia: Chronic and unchanged.  Patient's hemoglobin is 8.1. 3.  Thrombocytopenia: Chronic and unchanged. 4.  Leukopenia: Resolved. 5.  Chronic renal insufficiency: Creatinine slightly worse today at 1.71.  Monitor.   Patient expressed understanding and was in agreement with this plan. She also understands that She can call clinic at any time with any  questions,  concerns, or complaints.    Lloyd Huger, MD   12/19/2020 10:51 AM

## 2020-12-18 ENCOUNTER — Inpatient Hospital Stay: Payer: PPO | Attending: Oncology | Admitting: Oncology

## 2020-12-18 ENCOUNTER — Inpatient Hospital Stay: Payer: PPO | Admitting: Pharmacist

## 2020-12-18 ENCOUNTER — Encounter (INDEPENDENT_AMBULATORY_CARE_PROVIDER_SITE_OTHER): Payer: Self-pay | Admitting: Nurse Practitioner

## 2020-12-18 ENCOUNTER — Inpatient Hospital Stay: Payer: PPO

## 2020-12-18 ENCOUNTER — Encounter: Payer: Self-pay | Admitting: Oncology

## 2020-12-18 VITALS — BP 130/59 | HR 85 | Temp 97.8°F | Wt 209.9 lb

## 2020-12-18 DIAGNOSIS — C9000 Multiple myeloma not having achieved remission: Secondary | ICD-10-CM

## 2020-12-18 DIAGNOSIS — D696 Thrombocytopenia, unspecified: Secondary | ICD-10-CM | POA: Insufficient documentation

## 2020-12-18 DIAGNOSIS — Z803 Family history of malignant neoplasm of breast: Secondary | ICD-10-CM | POA: Diagnosis not present

## 2020-12-18 DIAGNOSIS — Z96653 Presence of artificial knee joint, bilateral: Secondary | ICD-10-CM | POA: Diagnosis not present

## 2020-12-18 DIAGNOSIS — Z79899 Other long term (current) drug therapy: Secondary | ICD-10-CM | POA: Insufficient documentation

## 2020-12-18 DIAGNOSIS — D649 Anemia, unspecified: Secondary | ICD-10-CM | POA: Diagnosis not present

## 2020-12-18 DIAGNOSIS — Z90710 Acquired absence of both cervix and uterus: Secondary | ICD-10-CM | POA: Insufficient documentation

## 2020-12-18 DIAGNOSIS — N189 Chronic kidney disease, unspecified: Secondary | ICD-10-CM | POA: Insufficient documentation

## 2020-12-18 DIAGNOSIS — D472 Monoclonal gammopathy: Secondary | ICD-10-CM

## 2020-12-18 LAB — CBC WITH DIFFERENTIAL/PLATELET
Abs Immature Granulocytes: 0.02 10*3/uL (ref 0.00–0.07)
Basophils Absolute: 0 10*3/uL (ref 0.0–0.1)
Basophils Relative: 0 %
Eosinophils Absolute: 0.2 10*3/uL (ref 0.0–0.5)
Eosinophils Relative: 3 %
HCT: 25.9 % — ABNORMAL LOW (ref 36.0–46.0)
Hemoglobin: 8.1 g/dL — ABNORMAL LOW (ref 12.0–15.0)
Immature Granulocytes: 0 %
Lymphocytes Relative: 21 %
Lymphs Abs: 1 10*3/uL (ref 0.7–4.0)
MCH: 32.5 pg (ref 26.0–34.0)
MCHC: 31.3 g/dL (ref 30.0–36.0)
MCV: 104 fL — ABNORMAL HIGH (ref 80.0–100.0)
Monocytes Absolute: 0.5 10*3/uL (ref 0.1–1.0)
Monocytes Relative: 9 %
Neutro Abs: 3.3 10*3/uL (ref 1.7–7.7)
Neutrophils Relative %: 67 %
Platelets: 139 10*3/uL — ABNORMAL LOW (ref 150–400)
RBC: 2.49 MIL/uL — ABNORMAL LOW (ref 3.87–5.11)
RDW: 17.1 % — ABNORMAL HIGH (ref 11.5–15.5)
WBC: 5 10*3/uL (ref 4.0–10.5)
nRBC: 0 % (ref 0.0–0.2)

## 2020-12-18 LAB — BASIC METABOLIC PANEL
Anion gap: 7 (ref 5–15)
BUN: 39 mg/dL — ABNORMAL HIGH (ref 8–23)
CO2: 25 mmol/L (ref 22–32)
Calcium: 9.1 mg/dL (ref 8.9–10.3)
Chloride: 102 mmol/L (ref 98–111)
Creatinine, Ser: 1.71 mg/dL — ABNORMAL HIGH (ref 0.44–1.00)
GFR, Estimated: 30 mL/min — ABNORMAL LOW (ref >60)
Glucose, Bld: 99 mg/dL (ref 70–99)
Potassium: 3.7 mmol/L (ref 3.5–5.1)
Sodium: 134 mmol/L — ABNORMAL LOW (ref 135–145)

## 2020-12-18 NOTE — Progress Notes (Signed)
Patient here for follow up. She has no complaints.

## 2020-12-18 NOTE — Progress Notes (Signed)
[place]  [phone] [phone]  Patient Care Team: Idelle Crouch, MD as PCP - General (Internal Medicine) Lloyd Huger, MD as Consulting Physician (Oncology) Corey Skains, MD as Consulting Physician (Cardiology)   Name of the patient: Cynthia Wallace  [id number]  10/10/1936   Date of visit: 12/18/20  HPI: Patient is a 85 y.o. female with newly diagnosed multiple myeloma. Planned treatment with Revlimid and dexamethasone.   Reason for Consult: Revlimid (lenalidomide) oral chemotherapy education.   PAST MEDICAL HISTORY: Past Medical History:  Diagnosis Date  . Acid reflux `  . Adenomatous colon polyp   . Anemia   . Arthritis   . Asthma   . Atrial fibrillation ([place])   . Bacteremia   . Bilateral leg edema   . Bilateral leg edema   . Bronchitis   . Cardiomyopathy ([place])   . Chickenpox   . Chronic a-fib ([place])   . Chronic atrial fibrillation ([place])   . Chronic pulmonary hypertension ([place])   . Coronary artery disease   . Dyspnea on exertion   . Fibrocystic breast disease   . Hyperlipidemia   . Hypertension   . Iron deficiency anemia   . Localized edema   . MGUS (monoclonal gammopathy of unknown significance)   . Neuropathy   . Osteoporosis   . Pelvic pain   . Pneumonia   . PUD (peptic ulcer disease)   . Severe tricuspid valve insufficiency   . Sick sinus syndrome ([place])   . Sleep apnea   . Tricuspid valve insufficiency   . Venous insufficiency of both lower extremities     PAST SURGICAL HISTORY:  Past Surgical History:  Procedure Laterality Date  . ABDOMINAL HYSTERECTOMY    . BILATERAL KNEE ARTHROSCOPY    . BREAST EXCISIONAL BIOPSY Right 1990   NEG  . BREAST SURGERY    . CARDIAC CATHETERIZATION    . COLONOSCOPY WITH PROPOFOL N/A 08/27/2016   Procedure: COLONOSCOPY WITH PROPOFOL;  Surgeon: Manya Silvas, MD;  Location: [facility] ENDOSCOPY;  Service: Endoscopy;  Laterality: N/A;   . COLONOSCOPY WITH PROPOFOL N/A 11/21/2020   Procedure: COLONOSCOPY WITH PROPOFOL;  Surgeon: Toledo, Benay Pike, MD;  Location: [facility] ENDOSCOPY;  Service: Gastroenterology;  Laterality: N/A;  . ESOPHAGOGASTRODUODENOSCOPY (EGD) WITH PROPOFOL N/A 11/21/2020   Procedure: ESOPHAGOGASTRODUODENOSCOPY (EGD) WITH PROPOFOL;  Surgeon: Toledo, Benay Pike, MD;  Location: [facility] ENDOSCOPY;  Service: Gastroenterology;  Laterality: N/A;  . INSERT / REPLACE / REMOVE PACEMAKER    . JOINT REPLACEMENT    . PACEMAKER INSERTION    . TUBAL LIGATION      HEMATOLOGY/ONCOLOGY HISTORY:  Oncology History   No history exists.    ALLERGIES:  is allergic to codeine and penicillin g.  MEDICATIONS:  Current Outpatient Medications  Medication Sig Dispense Refill  . albuterol (PROVENTIL) (2.5 MG/3ML) 0.083% nebulizer solution Inhale into the lungs every 4 (four) hours as needed.    Marland Kitchen ascorbic acid (VITAMIN C) 100 MG tablet Take 100 mg by mouth daily.    Marland Kitchen atorvastatin (LIPITOR) 10 MG tablet Take 10 mg by mouth.    . B Complex Vitamins (VITAMIN B COMPLEX PO) Take by mouth.    . Cholecalciferol 25 MCG (1000 UT) tablet Take by mouth.    . dexamethasone (DECADRON) 4 MG tablet Take 5 tablets (20 mg total) by mouth once a week. Take with food. 20 tablet 3  . digoxin (LANOXIN) 0.125 MG tablet Take 0.125 mg by  mouth daily.     Marland Kitchen esomeprazole (NEXIUM) 10 MG packet Take 10 mg by mouth daily before breakfast.    . fluticasone (FLONASE) 50 MCG/ACT nasal spray SHAKE LIQUID AND USE 2 SPRAYS IN EACH NOSTRIL EVERY DAY AS NEEDED FOR RHINITIS OR ALLERGIES    . furosemide (LASIX) 20 MG tablet Take 40 mg by mouth daily. Pt to take 2 tablets once a day     . hydrOXYzine (ATARAX/VISTARIL) 10 MG tablet Take 1 tablet by mouth 3 (three) times daily.    Marland Kitchen lenalidomide (REVLIMID) 15 MG capsule Take 1 capsule (15 mg total) by mouth daily. for 21 days with then 7 days off. (Patient not taking: Reported on 12/18/2020) 21 capsule 0  . levocetirizine  (XYZAL) 5 MG tablet Take 5 mg by mouth daily.    Marland Kitchen losartan (COZAAR) 50 MG tablet Take 50 mg by mouth daily.    . Melatonin 10 MG TABS Take 1 tablet by mouth at bedtime as needed. Takes at night    . metoprolol succinate (TOPROL-XL) 50 MG 24 hr tablet Take 50 mg by mouth 2 (two) times daily.    . montelukast (SINGULAIR) 10 MG tablet Take 10 mg by mouth at bedtime.    . pantoprazole (PROTONIX) 40 MG tablet Take 40 mg by mouth daily.    . potassium chloride (K-DUR) 10 MEQ tablet Take 10 mEq by mouth 3 (three) times daily.     . rivaroxaban (XARELTO) 20 MG TABS tablet Take 20 mg by mouth daily with supper.    . traMADol (ULTRAM) 50 MG tablet Take 50 mg by mouth 4 (four) times daily as needed.    . traZODone (DESYREL) 50 MG tablet Take 50 mg by mouth at bedtime.    . vitamin B-12 (CYANOCOBALAMIN) 1000 MCG tablet Take by mouth.     No current facility-administered medications for this visit.    VITAL SIGNS: There were no vitals taken for this visit. There were no vitals filed for this visit.  Estimated body mass index is 32.87 kg/m as calculated from the following:   Height as of 11/21/20: 5' 7" (1.702 m).   Weight as of an earlier encounter on 12/18/20: 95.2 kg (209 lb 14.4 oz).  LABS: CBC:    Component Value Date/Time   WBC 5.0 12/18/2020 1554   HGB 8.1 (L) 12/18/2020 1554   HGB 11.9 (L) 03/05/2015 1116   HCT 25.9 (L) 12/18/2020 1554   HCT 36.6 03/05/2015 1116   PLT 139 (L) 12/18/2020 1554   PLT 154 03/05/2015 1116   MCV 104.0 (H) 12/18/2020 1554   MCV 96 03/05/2015 1116   NEUTROABS 3.3 12/18/2020 1554   NEUTROABS 4.5 03/05/2015 1116   LYMPHSABS 1.0 12/18/2020 1554   LYMPHSABS 1.6 03/05/2015 1116   MONOABS 0.5 12/18/2020 1554   MONOABS 0.6 03/05/2015 1116   EOSABS 0.2 12/18/2020 1554   EOSABS 0.1 03/05/2015 1116   BASOSABS 0.0 12/18/2020 1554   BASOSABS 0.0 03/05/2015 1116   Comprehensive Metabolic Panel:    Component Value Date/Time   NA 134 (L) 12/18/2020 1554   NA  139 03/05/2015 1116   K 3.7 12/18/2020 1554   K 4.1 03/05/2015 1116   CL 102 12/18/2020 1554   CL 106 03/05/2015 1116   CO2 25 12/18/2020 1554   CO2 28 03/05/2015 1116   BUN 39 (H) 12/18/2020 1554   BUN 23 (H) 03/05/2015 1116   CREATININE 1.71 (H) 12/18/2020 1554   CREATININE 0.75 03/05/2015 1116  GLUCOSE 99 12/18/2020 1554   GLUCOSE 92 03/05/2015 1116   CALCIUM 9.1 12/18/2020 1554   CALCIUM 8.9 03/05/2015 1116   AST 22 11/09/2020 1551   ALT 10 11/09/2020 1551   ALKPHOS 76 11/09/2020 1551   BILITOT 1.4 (H) 11/09/2020 1551   PROT 9.2 (H) 11/09/2020 1551   ALBUMIN 3.7 11/09/2020 1551    RADIOGRAPHIC STUDIES: No results found.   Assessment and Plan-  Start her Revlimid and dexamethasone today 12/19/19.   Patient Education I spoke with patient for overview of new oral chemotherapy medication: Revlimid (lenalidomide) for the treatment of newly diagnosed multiple myeloma in conjunction with dexamethasone, planned duration until disease control, progression, or unacceptable drug toxicity.   Counseled patient on administration, dosing, side effects, monitoring, drug-food interactions, safe handling, storage, and disposal. Patient will take Revlimid 1 capsule (15 mg total) by mouth daily. for 21 days with then 7 days off.  She will also take dexamethasone, 5 tablets (20 mg total) by mouth once a week. Take with food.  Patient took her first dose of dexamethasone in clinic today and plans n taking her first dose of Revlimid tonight. Placed her Revlimid in her chiming medication bottle for her during today visit.  Side effects include but not limited to: rash/itchy skin, fatigue, constipation or diarrhea, decreased wbc.    Reviewed with patient importance of keeping a medication schedule and plan for any missed doses.  After discussion with patient one patient barriers to medication adherence identified. The on/off schedule for the Revlimid and the weekly dosing of the dexamethasone  can be a complicated regimen to keep track of. Patient was provided with a medication calendar that covered both medication. She also received a chiming medication bottle from [facility] for her Revlimid.  Ms. Desa voiced understanding and appreciation. All questions answered. Patient's daughter had some questions and those were answered too. Medication handout provided.  Provided patient with [facility] phone number. Patient knows to call the office with questions or concerns. [facility] will continue to follow.  Medication Access Issues: none, patient receives her medication from [facility] and has this month's medication in hand  Patient expressed understanding and was in agreement with this plan. She also understands that She can call clinic at any time with any questions, concerns, or complaints.   Thank you for allowing me to participate in the care of this very pleasant patient.   Time Total: 30 mins  Visit consisted of counseling and education on dealing with issues of symptom management in the setting of serious and potentially life-threatening illness.Greater than 50%  of this time was spent counseling and coordinating care related to the above assessment and plan.  Signed by: Darl Pikes, PharmD, BCPS, Salley Slaughter, CPP Hematology/Oncology Clinical Pharmacist Practitioner [facility]/AP [facility] [phone]  12/18/2020 4:39 PM

## 2020-12-19 LAB — PROTEIN ELECTROPHORESIS, SERUM
A/G Ratio: 0.8 (ref 0.7–1.7)
Albumin ELP: 4 g/dL (ref 2.9–4.4)
Alpha-1-Globulin: 0.3 g/dL (ref 0.0–0.4)
Alpha-2-Globulin: 0.8 g/dL (ref 0.4–1.0)
Beta Globulin: 0.9 g/dL (ref 0.7–1.3)
Gamma Globulin: 3.4 g/dL — ABNORMAL HIGH (ref 0.4–1.8)
Globulin, Total: 5.3 g/dL — ABNORMAL HIGH (ref 2.2–3.9)
M-Spike, %: 3.1 g/dL — ABNORMAL HIGH
Total Protein ELP: 9.3 g/dL — ABNORMAL HIGH (ref 6.0–8.5)

## 2020-12-19 LAB — IGG, IGA, IGM
IgA: 70 mg/dL (ref 64–422)
IgG (Immunoglobin G), Serum: 4157 mg/dL — ABNORMAL HIGH (ref 586–1602)
IgM (Immunoglobulin M), Srm: 32 mg/dL (ref 26–217)

## 2020-12-19 LAB — KAPPA/LAMBDA LIGHT CHAINS
Kappa free light chain: 301.8 mg/L — ABNORMAL HIGH (ref 3.3–19.4)
Kappa, lambda light chain ratio: 12.58 — ABNORMAL HIGH (ref 0.26–1.65)
Lambda free light chains: 24 mg/L (ref 5.7–26.3)

## 2020-12-24 ENCOUNTER — Other Ambulatory Visit: Payer: Self-pay | Admitting: *Deleted

## 2020-12-24 DIAGNOSIS — C9000 Multiple myeloma not having achieved remission: Secondary | ICD-10-CM

## 2020-12-26 MED ORDER — LENALIDOMIDE 15 MG PO CAPS: 15.0000 mg | ORAL_CAPSULE | Freq: Every day | ORAL | 0 refills | Status: AC

## 2020-12-31 ENCOUNTER — Emergency Department: Payer: PPO

## 2020-12-31 ENCOUNTER — Other Ambulatory Visit: Payer: Self-pay

## 2020-12-31 ENCOUNTER — Encounter: Payer: Self-pay | Admitting: Intensive Care

## 2020-12-31 ENCOUNTER — Inpatient Hospital Stay
Admission: EM | Admit: 2020-12-31 | Discharge: 2021-02-05 | DRG: 870 | Disposition: E | Payer: PPO | Attending: Internal Medicine | Admitting: Internal Medicine

## 2020-12-31 DIAGNOSIS — E782 Mixed hyperlipidemia: Secondary | ICD-10-CM | POA: Diagnosis present

## 2020-12-31 DIAGNOSIS — R570 Cardiogenic shock: Secondary | ICD-10-CM | POA: Diagnosis present

## 2020-12-31 DIAGNOSIS — I5023 Acute on chronic systolic (congestive) heart failure: Secondary | ICD-10-CM | POA: Diagnosis not present

## 2020-12-31 DIAGNOSIS — R069 Unspecified abnormalities of breathing: Secondary | ICD-10-CM | POA: Diagnosis not present

## 2020-12-31 DIAGNOSIS — E870 Hyperosmolality and hypernatremia: Secondary | ICD-10-CM | POA: Diagnosis not present

## 2020-12-31 DIAGNOSIS — C9000 Multiple myeloma not having achieved remission: Secondary | ICD-10-CM | POA: Diagnosis not present

## 2020-12-31 DIAGNOSIS — J9602 Acute respiratory failure with hypercapnia: Secondary | ICD-10-CM | POA: Diagnosis not present

## 2020-12-31 DIAGNOSIS — J151 Pneumonia due to Pseudomonas: Secondary | ICD-10-CM | POA: Diagnosis present

## 2020-12-31 DIAGNOSIS — Z515 Encounter for palliative care: Secondary | ICD-10-CM | POA: Diagnosis not present

## 2020-12-31 DIAGNOSIS — Z7189 Other specified counseling: Secondary | ICD-10-CM | POA: Diagnosis not present

## 2020-12-31 DIAGNOSIS — I429 Cardiomyopathy, unspecified: Secondary | ICD-10-CM | POA: Diagnosis not present

## 2020-12-31 DIAGNOSIS — J9600 Acute respiratory failure, unspecified whether with hypoxia or hypercapnia: Secondary | ICD-10-CM

## 2020-12-31 DIAGNOSIS — I4891 Unspecified atrial fibrillation: Secondary | ICD-10-CM | POA: Diagnosis not present

## 2020-12-31 DIAGNOSIS — Z4682 Encounter for fitting and adjustment of non-vascular catheter: Secondary | ICD-10-CM | POA: Diagnosis not present

## 2020-12-31 DIAGNOSIS — I110 Hypertensive heart disease with heart failure: Secondary | ICD-10-CM | POA: Diagnosis not present

## 2020-12-31 DIAGNOSIS — N179 Acute kidney failure, unspecified: Secondary | ICD-10-CM | POA: Diagnosis not present

## 2020-12-31 DIAGNOSIS — Z6833 Body mass index (BMI) 33.0-33.9, adult: Secondary | ICD-10-CM

## 2020-12-31 DIAGNOSIS — I509 Heart failure, unspecified: Secondary | ICD-10-CM | POA: Diagnosis not present

## 2020-12-31 DIAGNOSIS — Z950 Presence of cardiac pacemaker: Secondary | ICD-10-CM

## 2020-12-31 DIAGNOSIS — I248 Other forms of acute ischemic heart disease: Secondary | ICD-10-CM | POA: Diagnosis not present

## 2020-12-31 DIAGNOSIS — E7800 Pure hypercholesterolemia, unspecified: Secondary | ICD-10-CM | POA: Diagnosis present

## 2020-12-31 DIAGNOSIS — J9690 Respiratory failure, unspecified, unspecified whether with hypoxia or hypercapnia: Secondary | ICD-10-CM | POA: Diagnosis not present

## 2020-12-31 DIAGNOSIS — R791 Abnormal coagulation profile: Secondary | ICD-10-CM | POA: Diagnosis not present

## 2020-12-31 DIAGNOSIS — D696 Thrombocytopenia, unspecified: Secondary | ICD-10-CM | POA: Diagnosis not present

## 2020-12-31 DIAGNOSIS — J9601 Acute respiratory failure with hypoxia: Secondary | ICD-10-CM | POA: Diagnosis not present

## 2020-12-31 DIAGNOSIS — N1832 Chronic kidney disease, stage 3b: Secondary | ICD-10-CM | POA: Diagnosis present

## 2020-12-31 DIAGNOSIS — I5043 Acute on chronic combined systolic (congestive) and diastolic (congestive) heart failure: Secondary | ICD-10-CM | POA: Diagnosis present

## 2020-12-31 DIAGNOSIS — I2510 Atherosclerotic heart disease of native coronary artery without angina pectoris: Secondary | ICD-10-CM | POA: Diagnosis present

## 2020-12-31 DIAGNOSIS — G928 Other toxic encephalopathy: Secondary | ICD-10-CM | POA: Diagnosis not present

## 2020-12-31 DIAGNOSIS — Z90710 Acquired absence of both cervix and uterus: Secondary | ICD-10-CM

## 2020-12-31 DIAGNOSIS — A419 Sepsis, unspecified organism: Secondary | ICD-10-CM

## 2020-12-31 DIAGNOSIS — J189 Pneumonia, unspecified organism: Secondary | ICD-10-CM

## 2020-12-31 DIAGNOSIS — J1282 Pneumonia due to coronavirus disease 2019: Secondary | ICD-10-CM | POA: Diagnosis not present

## 2020-12-31 DIAGNOSIS — R457 State of emotional shock and stress, unspecified: Secondary | ICD-10-CM | POA: Diagnosis not present

## 2020-12-31 DIAGNOSIS — Z4659 Encounter for fitting and adjustment of other gastrointestinal appliance and device: Secondary | ICD-10-CM

## 2020-12-31 DIAGNOSIS — U071 COVID-19: Secondary | ICD-10-CM | POA: Diagnosis present

## 2020-12-31 DIAGNOSIS — Z66 Do not resuscitate: Secondary | ICD-10-CM | POA: Diagnosis not present

## 2020-12-31 DIAGNOSIS — I50813 Acute on chronic right heart failure: Secondary | ICD-10-CM | POA: Diagnosis not present

## 2020-12-31 DIAGNOSIS — Z79899 Other long term (current) drug therapy: Secondary | ICD-10-CM | POA: Diagnosis not present

## 2020-12-31 DIAGNOSIS — I700 Atherosclerosis of aorta: Secondary | ICD-10-CM | POA: Diagnosis not present

## 2020-12-31 DIAGNOSIS — D61818 Other pancytopenia: Secondary | ICD-10-CM | POA: Diagnosis not present

## 2020-12-31 DIAGNOSIS — I130 Hypertensive heart and chronic kidney disease with heart failure and stage 1 through stage 4 chronic kidney disease, or unspecified chronic kidney disease: Secondary | ICD-10-CM | POA: Diagnosis present

## 2020-12-31 DIAGNOSIS — I482 Chronic atrial fibrillation, unspecified: Secondary | ICD-10-CM | POA: Diagnosis present

## 2020-12-31 DIAGNOSIS — R0602 Shortness of breath: Secondary | ICD-10-CM | POA: Diagnosis not present

## 2020-12-31 DIAGNOSIS — R6521 Severe sepsis with septic shock: Secondary | ICD-10-CM | POA: Diagnosis present

## 2020-12-31 DIAGNOSIS — J45909 Unspecified asthma, uncomplicated: Secondary | ICD-10-CM | POA: Diagnosis not present

## 2020-12-31 DIAGNOSIS — M810 Age-related osteoporosis without current pathological fracture: Secondary | ICD-10-CM | POA: Diagnosis present

## 2020-12-31 DIAGNOSIS — J80 Acute respiratory distress syndrome: Secondary | ICD-10-CM | POA: Diagnosis not present

## 2020-12-31 DIAGNOSIS — I517 Cardiomegaly: Secondary | ICD-10-CM | POA: Diagnosis not present

## 2020-12-31 DIAGNOSIS — A4189 Other specified sepsis: Secondary | ICD-10-CM | POA: Diagnosis not present

## 2020-12-31 DIAGNOSIS — R4182 Altered mental status, unspecified: Secondary | ICD-10-CM | POA: Diagnosis not present

## 2020-12-31 DIAGNOSIS — Z9221 Personal history of antineoplastic chemotherapy: Secondary | ICD-10-CM

## 2020-12-31 DIAGNOSIS — R918 Other nonspecific abnormal finding of lung field: Secondary | ICD-10-CM | POA: Diagnosis not present

## 2020-12-31 DIAGNOSIS — R21 Rash and other nonspecific skin eruption: Secondary | ICD-10-CM | POA: Diagnosis not present

## 2020-12-31 DIAGNOSIS — J90 Pleural effusion, not elsewhere classified: Secondary | ICD-10-CM | POA: Diagnosis not present

## 2020-12-31 DIAGNOSIS — R0902 Hypoxemia: Secondary | ICD-10-CM | POA: Diagnosis not present

## 2020-12-31 DIAGNOSIS — I272 Pulmonary hypertension, unspecified: Secondary | ICD-10-CM | POA: Diagnosis present

## 2020-12-31 DIAGNOSIS — Z7901 Long term (current) use of anticoagulants: Secondary | ICD-10-CM | POA: Diagnosis not present

## 2020-12-31 DIAGNOSIS — R410 Disorientation, unspecified: Secondary | ICD-10-CM | POA: Diagnosis not present

## 2020-12-31 DIAGNOSIS — I5033 Acute on chronic diastolic (congestive) heart failure: Secondary | ICD-10-CM | POA: Diagnosis not present

## 2020-12-31 DIAGNOSIS — I10 Essential (primary) hypertension: Secondary | ICD-10-CM | POA: Diagnosis not present

## 2020-12-31 HISTORY — DX: Heart failure, unspecified: I50.9

## 2020-12-31 LAB — URINALYSIS, COMPLETE (UACMP) WITH MICROSCOPIC
Bilirubin Urine: NEGATIVE
Glucose, UA: NEGATIVE mg/dL
Ketones, ur: NEGATIVE mg/dL
Leukocytes,Ua: NEGATIVE
Nitrite: NEGATIVE
Protein, ur: 100 mg/dL — AB
Specific Gravity, Urine: 1.013 (ref 1.005–1.030)
pH: 5 (ref 5.0–8.0)

## 2020-12-31 LAB — TROPONIN I (HIGH SENSITIVITY)
Troponin I (High Sensitivity): 122 ng/L (ref <18)
Troponin I (High Sensitivity): 143 ng/L (ref <18)

## 2020-12-31 LAB — BASIC METABOLIC PANEL
Anion gap: 12 (ref 5–15)
BUN: 27 mg/dL — ABNORMAL HIGH (ref 8–23)
CO2: 21 mmol/L — ABNORMAL LOW (ref 22–32)
Calcium: 7.7 mg/dL — ABNORMAL LOW (ref 8.9–10.3)
Chloride: 104 mmol/L (ref 98–111)
Creatinine, Ser: 1.5 mg/dL — ABNORMAL HIGH (ref 0.44–1.00)
GFR, Estimated: 35 mL/min — ABNORMAL LOW (ref >60)
Glucose, Bld: 123 mg/dL — ABNORMAL HIGH (ref 70–99)
Potassium: 3.3 mmol/L — ABNORMAL LOW (ref 3.5–5.1)
Sodium: 137 mmol/L (ref 135–145)

## 2020-12-31 LAB — CBC
HCT: 25.1 % — ABNORMAL LOW (ref 36.0–46.0)
Hemoglobin: 8 g/dL — ABNORMAL LOW (ref 12.0–15.0)
MCH: 32.1 pg (ref 26.0–34.0)
MCHC: 31.9 g/dL (ref 30.0–36.0)
MCV: 100.8 fL — ABNORMAL HIGH (ref 80.0–100.0)
Platelets: 75 10*3/uL — ABNORMAL LOW (ref 150–400)
RBC: 2.49 MIL/uL — ABNORMAL LOW (ref 3.87–5.11)
RDW: 16.3 % — ABNORMAL HIGH (ref 11.5–15.5)
WBC: 6.8 10*3/uL (ref 4.0–10.5)
nRBC: 0 % (ref 0.0–0.2)

## 2020-12-31 LAB — CBG MONITORING, ED: Glucose-Capillary: 100 mg/dL — ABNORMAL HIGH (ref 70–99)

## 2020-12-31 LAB — SARS CORONAVIRUS 2 BY RT PCR (HOSPITAL ORDER, PERFORMED IN ~~LOC~~ HOSPITAL LAB): SARS Coronavirus 2: POSITIVE — AB

## 2020-12-31 LAB — MAGNESIUM: Magnesium: 1.6 mg/dL — ABNORMAL LOW (ref 1.7–2.4)

## 2020-12-31 LAB — HEPATIC FUNCTION PANEL
ALT: 24 U/L (ref 0–44)
AST: 26 U/L (ref 15–41)
Albumin: 3.1 g/dL — ABNORMAL LOW (ref 3.5–5.0)
Alkaline Phosphatase: 66 U/L (ref 38–126)
Bilirubin, Direct: 1.1 mg/dL — ABNORMAL HIGH (ref 0.0–0.2)
Indirect Bilirubin: 1.9 mg/dL — ABNORMAL HIGH (ref 0.3–0.9)
Total Bilirubin: 3 mg/dL — ABNORMAL HIGH (ref 0.3–1.2)
Total Protein: 7.8 g/dL (ref 6.5–8.1)

## 2020-12-31 LAB — BRAIN NATRIURETIC PEPTIDE: B Natriuretic Peptide: 743.7 pg/mL — ABNORMAL HIGH (ref 0.0–100.0)

## 2020-12-31 LAB — PROCALCITONIN: Procalcitonin: 1.42 ng/mL

## 2020-12-31 MED ORDER — SODIUM CHLORIDE 0.9 % IV SOLN
2.0000 g | Freq: Once | INTRAVENOUS | Status: AC
  Administered 2020-12-31: 2 g via INTRAVENOUS
  Filled 2020-12-31: qty 20

## 2020-12-31 MED ORDER — LENALIDOMIDE 15 MG PO CAPS: 15.0000 mg | ORAL_CAPSULE | Freq: Every day | ORAL | Status: DC

## 2020-12-31 MED ORDER — FLUTICASONE PROPIONATE 50 MCG/ACT NA SUSP
1.0000 | Freq: Every day | NASAL | Status: DC
  Administered 2021-01-01 – 2021-01-02 (×2): 1 via NASAL
  Filled 2020-12-31: qty 16

## 2020-12-31 MED ORDER — ATORVASTATIN CALCIUM 10 MG PO TABS
10.0000 mg | ORAL_TABLET | Freq: Every day | ORAL | Status: DC
  Administered 2021-01-01 – 2021-01-03 (×3): 10 mg via ORAL
  Filled 2020-12-31 (×3): qty 1

## 2020-12-31 MED ORDER — GUAIFENESIN-DM 100-10 MG/5ML PO SYRP
10.0000 mL | ORAL_SOLUTION | ORAL | Status: DC | PRN
Start: 2020-12-31 — End: 2021-01-04
  Administered 2021-01-01 – 2021-01-03 (×4): 10 mL via ORAL
  Filled 2020-12-31 (×4): qty 10

## 2020-12-31 MED ORDER — ACETAMINOPHEN 325 MG PO TABS: 650.0000 mg | ORAL_TABLET | Freq: Four times a day (QID) | ORAL | Status: DC | PRN

## 2020-12-31 MED ORDER — POTASSIUM CHLORIDE 20 MEQ PO PACK
40.0000 meq | PACK | Freq: Once | ORAL | Status: AC
  Administered 2021-01-01: 40 meq via ORAL
  Filled 2020-12-31: qty 2

## 2020-12-31 MED ORDER — AZITHROMYCIN 500 MG PO TABS
500.0000 mg | ORAL_TABLET | Freq: Once | ORAL | Status: AC
  Administered 2021-01-01: 500 mg via ORAL
  Filled 2020-12-31: qty 1

## 2020-12-31 MED ORDER — VITAMIN C 250 MG PO TABS
125.0000 mg | ORAL_TABLET | Freq: Every day | ORAL | Status: DC
  Administered 2021-01-02: 125 mg via ORAL
  Filled 2020-12-31 (×4): qty 1

## 2020-12-31 MED ORDER — DEXAMETHASONE SODIUM PHOSPHATE 10 MG/ML IJ SOLN
6.0000 mg | INTRAMUSCULAR | Status: DC
  Administered 2021-01-01 (×2): 6 mg via INTRAVENOUS
  Filled 2020-12-31 (×2): qty 1

## 2020-12-31 MED ORDER — ADULT MULTIVITAMIN W/MINERALS CH
1.0000 | ORAL_TABLET | Freq: Every day | ORAL | Status: DC
  Administered 2021-01-01 – 2021-01-03 (×3): 1 via ORAL
  Filled 2020-12-31 (×3): qty 1

## 2020-12-31 MED ORDER — METOPROLOL SUCCINATE ER 50 MG PO TB24
50.0000 mg | ORAL_TABLET | Freq: Two times a day (BID) | ORAL | Status: DC
  Administered 2021-01-01: 50 mg via ORAL

## 2020-12-31 MED ORDER — TRAMADOL HCL 50 MG PO TABS
50.0000 mg | ORAL_TABLET | Freq: Four times a day (QID) | ORAL | Status: DC | PRN
  Administered 2021-01-02: 14:00:00 50 mg via ORAL
  Filled 2020-12-31: qty 1

## 2020-12-31 MED ORDER — MONTELUKAST SODIUM 10 MG PO TABS
10.0000 mg | ORAL_TABLET | Freq: Every day | ORAL | Status: DC
  Administered 2021-01-01 – 2021-01-02 (×3): 10 mg via ORAL
  Filled 2020-12-31 (×5): qty 1

## 2020-12-31 MED ORDER — DEXAMETHASONE SODIUM PHOSPHATE 10 MG/ML IJ SOLN: 6.0000 mg | Freq: Once | INTRAMUSCULAR | Status: DC

## 2020-12-31 MED ORDER — RIVAROXABAN 20 MG PO TABS
20.0000 mg | ORAL_TABLET | Freq: Every day | ORAL | Status: DC
  Filled 2020-12-31: qty 1

## 2020-12-31 MED ORDER — FUROSEMIDE 10 MG/ML IJ SOLN
40.0000 mg | Freq: Once | INTRAMUSCULAR | Status: AC
  Administered 2021-01-01: 40 mg via INTRAVENOUS
  Filled 2020-12-31: qty 4

## 2020-12-31 MED ORDER — SODIUM CHLORIDE 0.9 % IV SOLN
100.0000 mg | Freq: Every day | INTRAVENOUS | Status: AC
  Administered 2021-01-01 – 2021-01-04 (×4): 100 mg via INTRAVENOUS
  Filled 2020-12-31 (×2): qty 100
  Filled 2020-12-31 (×2): qty 20

## 2020-12-31 MED ORDER — LOSARTAN POTASSIUM 50 MG PO TABS: 50.0000 mg | ORAL_TABLET | Freq: Every day | ORAL | Status: DC

## 2020-12-31 MED ORDER — SENNOSIDES-DOCUSATE SODIUM 8.6-50 MG PO TABS: 1.0000 | ORAL_TABLET | Freq: Every evening | ORAL | Status: DC | PRN

## 2020-12-31 MED ORDER — TRAZODONE HCL 50 MG PO TABS
50.0000 mg | ORAL_TABLET | Freq: Every day | ORAL | Status: DC
  Administered 2021-01-01 – 2021-01-02 (×3): 50 mg via ORAL
  Filled 2020-12-31 (×3): qty 1

## 2020-12-31 MED ORDER — PANTOPRAZOLE SODIUM 40 MG PO TBEC
40.0000 mg | DELAYED_RELEASE_TABLET | Freq: Every day | ORAL | Status: DC
  Administered 2021-01-01 – 2021-01-04 (×4): 40 mg via ORAL
  Filled 2020-12-31 (×5): qty 1

## 2020-12-31 MED ORDER — FUROSEMIDE 10 MG/ML IJ SOLN
20.0000 mg | Freq: Every day | INTRAMUSCULAR | Status: DC
  Administered 2021-01-01: 20 mg via INTRAVENOUS
  Filled 2020-12-31: qty 4

## 2020-12-31 MED ORDER — HYDROXYZINE HCL 10 MG PO TABS
10.0000 mg | ORAL_TABLET | Freq: Three times a day (TID) | ORAL | Status: DC
  Administered 2021-01-01 – 2021-01-03 (×9): 10 mg via ORAL
  Filled 2020-12-31 (×13): qty 1

## 2020-12-31 MED ORDER — ASPIRIN EC 81 MG PO TBEC
81.0000 mg | DELAYED_RELEASE_TABLET | Freq: Every day | ORAL | Status: DC
  Administered 2021-01-01 – 2021-01-03 (×3): 81 mg via ORAL
  Filled 2020-12-31 (×4): qty 1

## 2020-12-31 MED ORDER — SODIUM CHLORIDE 0.9 % IV SOLN
200.0000 mg | Freq: Once | INTRAVENOUS | Status: AC
  Administered 2021-01-01: 200 mg via INTRAVENOUS
  Filled 2020-12-31: qty 200

## 2020-12-31 NOTE — ED Notes (Signed)
Pt states she is on xarelto  Denies changes to urination, though does states specifically that sometimes it burns when she wipes

## 2020-12-31 NOTE — ED Triage Notes (Addendum)
Patient arrived by EMS from home with c/o shortness of breath for a couple months that has worsened recently and intermittent confusion. Blood sugar 100 in triage. Pacemaker present. Patient appears pale. Lives alone. A&O x 4 in triage with some confusion, unable to finish sentences due to AMS. HX cancer

## 2020-12-31 NOTE — ED Provider Notes (Signed)
[facility] Emergency Department Provider Note  ____________________________________________   Event Date/Time   First MD Initiated Contact with Patient 12/21/2020 2036     (approximate)  I have reviewed the triage vital signs and the nursing notes.   HISTORY  Chief Complaint Altered Mental Status and Shortness of Breath    HPI Cynthia Wallace is a 85 y.o. female with atrial fibrillation, sick sinus syndrome, CHF, multiple myeloma who comes in from home for shortness of breath for the past few months with periods of confusion.  Patient lives alone.  Patient states that she has had a cough and worsening shortness of breath over the past few days.  She states that it is intermittent, worse with exertion, better at rest.  She states that she came here today because her daughter had a hard time getting a hold of her and her son-in-law came to the house and called EMS.  She denies feeling confused but I stated that somebody was concerned that she was confused.  She tells me that she has missed some doses of her Xarelto but then when I asked her again about it she denied missing doses.  She states that she does take a medication every night for multiple myeloma. Just started new med 11th.  She has had her Covid vaccines and denies being around anybody with Covid.          Past Medical History:  Diagnosis Date  . Acid reflux `  . Adenomatous colon polyp   . Anemia   . Arthritis   . Asthma   . Atrial fibrillation ([place])   . Bacteremia   . Bilateral leg edema   . Bilateral leg edema   . Bronchitis   . Cardiomyopathy ([place])   . CHF (congestive heart failure) ([place])   . Chickenpox   . Chronic a-fib ([place])   . Chronic atrial fibrillation ([place])   . Chronic pulmonary hypertension ([place])   . Coronary artery disease   . Dyspnea on exertion   . Fibrocystic breast disease   . Hyperlipidemia   . Hypertension   . Iron deficiency anemia   . Localized edema   . MGUS  (monoclonal gammopathy of unknown significance)   . Neuropathy   . Osteoporosis   . Pelvic pain   . Pneumonia   . PUD (peptic ulcer disease)   . Severe tricuspid valve insufficiency   . Sick sinus syndrome ([place])   . Sleep apnea   . Tricuspid valve insufficiency   . Venous insufficiency of both lower extremities     Patient Active Problem List   Diagnosis Date Noted  . Combined hyperlipidemia 05/15/2020  . Pure hypercholesterolemia 05/15/2020  . Sick sinus syndrome ([place]) 03/10/2017  . Chronic pulmonary hypertension ([place]) 10/08/2016  . MGUS (monoclonal gammopathy of unknown significance) 09/17/2016  . Thrombocytopenia ([place]) 09/14/2016  . Anemia, unspecified 08/12/2016  . HTN (hypertension) 04/21/2016  . Pneumonia 04/21/2016  . Enlarged heart 04/21/2016  . Severe tricuspid valve insufficiency 03/19/2016  . Hypoxia 03/07/2016  . Left lower lobe pneumonia 03/07/2016  . Dyspnea on exertion 03/07/2016  . Neuropathy 04/24/2015  . Bronchitis 04/10/2015  . Bacteremia 04/02/2015  . Cardiomyopathy ([place]) 04/01/2015  . Paroxysmal atrial fibrillation ([place]) 04/01/2015  . Pelvic pain 04/01/2015  . Localized edema 03/21/2015  . Chronic a-fib ([place]) 02/26/2015  . OSA on CPAP 09/13/2014    Past Surgical History:  Procedure Laterality Date  . ABDOMINAL HYSTERECTOMY    . BILATERAL KNEE  ARTHROSCOPY    . BREAST EXCISIONAL BIOPSY Right 1990   NEG  . BREAST SURGERY    . CARDIAC CATHETERIZATION    . COLONOSCOPY WITH PROPOFOL N/A 08/27/2016   Procedure: COLONOSCOPY WITH PROPOFOL;  Surgeon: Manya Silvas, MD;  Location: [facility] ENDOSCOPY;  Service: Endoscopy;  Laterality: N/A;  . COLONOSCOPY WITH PROPOFOL N/A 11/21/2020   Procedure: COLONOSCOPY WITH PROPOFOL;  Surgeon: Toledo, Benay Pike, MD;  Location: [facility] ENDOSCOPY;  Service: Gastroenterology;  Laterality: N/A;  . ESOPHAGOGASTRODUODENOSCOPY (EGD) WITH PROPOFOL N/A 11/21/2020   Procedure: ESOPHAGOGASTRODUODENOSCOPY (EGD) WITH PROPOFOL;   Surgeon: Toledo, Benay Pike, MD;  Location: [facility] ENDOSCOPY;  Service: Gastroenterology;  Laterality: N/A;  . INSERT / REPLACE / REMOVE PACEMAKER    . JOINT REPLACEMENT    . PACEMAKER INSERTION    . TUBAL LIGATION      Prior to Admission medications   Medication Sig Start Date End Date Taking? Authorizing Provider  albuterol (PROVENTIL) (2.5 MG/3ML) 0.083% nebulizer solution Inhale into the lungs every 4 (four) hours as needed. 03/13/16   [provider]  ascorbic acid (VITAMIN C) 100 MG tablet Take 100 mg by mouth daily.    [provider]  atorvastatin (LIPITOR) 10 MG tablet Take 10 mg by mouth.    [provider]  B Complex Vitamins (VITAMIN B COMPLEX PO) Take by mouth.    [provider]  Cholecalciferol 25 MCG (1000 UT) tablet Take by mouth.    [provider]  dexamethasone (DECADRON) 4 MG tablet Take 5 tablets (20 mg total) by mouth once a week. Take with food. 11/15/20   Lloyd Huger, MD  digoxin (LANOXIN) 0.125 MG tablet Take 0.125 mg by mouth daily.     [provider]  esomeprazole (NEXIUM) 10 MG packet Take 10 mg by mouth daily before breakfast.    [provider]  fluticasone (FLONASE) 50 MCG/ACT nasal spray SHAKE LIQUID AND USE 2 SPRAYS IN EACH NOSTRIL EVERY DAY AS NEEDED FOR RHINITIS OR ALLERGIES 02/01/18   [provider]  furosemide (LASIX) 20 MG tablet Take 40 mg by mouth daily. Pt to take 2 tablets once a day     [provider]  hydrOXYzine (ATARAX/VISTARIL) 10 MG tablet Take 1 tablet by mouth 3 (three) times daily. 03/31/17   [provider]  lenalidomide (REVLIMID) 15 MG capsule Take 1 capsule (15 mg total) by mouth daily. for 21 days with then 7 days off. 12/26/20   Lloyd Huger, MD  levocetirizine (XYZAL) 5 MG tablet Take 5 mg by mouth daily. 05/14/20   [provider]  losartan (COZAAR) 50 MG tablet Take 50 mg by mouth daily.    [provider]  Melatonin 10  MG TABS Take 1 tablet by mouth at bedtime as needed. Takes at night    [provider]  metoprolol succinate (TOPROL-XL) 50 MG 24 hr tablet Take 50 mg by mouth 2 (two) times daily.    [provider]  montelukast (SINGULAIR) 10 MG tablet Take 10 mg by mouth at bedtime.    [provider]  pantoprazole (PROTONIX) 40 MG tablet Take 40 mg by mouth daily. 06/15/18 12/18/20  [provider]  potassium chloride (K-DUR) 10 MEQ tablet Take 10 mEq by mouth 3 (three) times daily.     [provider]  rivaroxaban (XARELTO) 20 MG TABS tablet Take 20 mg by mouth daily with supper.    [provider]  traMADol (ULTRAM) 50 MG tablet  Take 50 mg by mouth 4 (four) times daily as needed. 03/27/20   [provider]  traZODone (DESYREL) 50 MG tablet Take 50 mg by mouth at bedtime.    [provider]  vitamin B-12 (CYANOCOBALAMIN) 1000 MCG tablet Take by mouth.    [provider]    Allergies Codeine and Penicillin g  Family History  Adopted: Yes  Problem Relation Age of Onset  . Hyperlipidemia Mother   . Hypertension Mother   . Heart disease Mother   . Heart disease Father   . Cancer Sister   . Breast cancer Sister   . Stroke Sister   . Hyperlipidemia Sister   . Heart disease Sister     Social History Social History   Tobacco Use  . Smoking status: Never Smoker  . Smokeless tobacco: Never Used  Vaping Use  . Vaping Use: Never used  Substance Use Topics  . Alcohol use: No  . Drug use: No      Review of Systems Constitutional: No fever/chills Eyes: No visual changes. ENT: No sore throat. Cardiovascular: Denies chest pain. Respiratory: Shortness of breath, cough Gastrointestinal: No abdominal pain.  No nausea, no vomiting.  No diarrhea.  No constipation. Genitourinary: Negative for dysuria. Musculoskeletal: Negative for back pain. Skin: Negative for rash. Neurological: Negative for headaches, focal weakness or  numbness. All other ROS negative ____________________________________________   PHYSICAL EXAM:  VITAL SIGNS: ED Triage Vitals [12/11/2020 1858]  Enc Vitals Group     BP      Pulse      Resp      Temp      Temp src      SpO2      Weight 200 lb (90.7 kg)     Height 5' 5"  (1.651 m)     Head Circumference      Peak Flow      Pain Score 0     Pain Loc      Pain Edu?      Excl. in [place]?     Constitutional: Alert and oriented. Well appearing and in no acute distress.  Is alert and oriented but does occasionally seem confused when asking her questions Eyes: Conjunctivae are normal. EOMI. Head: Atraumatic. Nose: No congestion/rhinnorhea. Mouth/Throat: Mucous membranes are moist.   Neck: No stridor. Trachea Midline. FROM Cardiovascular: Normal rate, regular rhythm. Grossly normal heart sounds.  Good peripheral circulation.  Pacemaker palpated on the left chest wall Respiratory: Normal respiratory effort.  No retractions. Lungs CTAB. Gastrointestinal: Soft and nontender. No distention. No abdominal bruits.  Musculoskeletal: 1+ edema bilaterally..  No joint effusions. Neurologic:  Normal speech and language. No gross focal neurologic deficits are appreciated.  Equal strength in arms and legs Skin:  Skin is warm, dry and intact. No rash noted. Psychiatric: Mood and affect are normal. Speech and behavior are normal. GU: Deferred   ____________________________________________   LABS (all labs ordered are listed, but only abnormal results are displayed)  Labs Reviewed  BASIC METABOLIC PANEL - Abnormal; Notable for the following components:      Result Value   Potassium 3.3 (*)    CO2 21 (*)    Glucose, Bld 123 (*)    BUN 27 (*)    Creatinine, Ser 1.50 (*)    Calcium 7.7 (*)    GFR, Estimated 35 (*)    All other components within normal limits  CBC - Abnormal; Notable for the following components:   RBC 2.49 (*)  Hemoglobin 8.0 (*)    HCT 25.1 (*)    MCV 100.8 (*)    RDW  16.3 (*)    Platelets 75 (*)    All other components within normal limits  CBG MONITORING, ED - Abnormal; Notable for the following components:   Glucose-Capillary 100 (*)    All other components within normal limits  TROPONIN I (HIGH SENSITIVITY) - Abnormal; Notable for the following components:   Troponin I (High Sensitivity) 122 (*)    All other components within normal limits  TROPONIN I (HIGH SENSITIVITY)   ____________________________________________   ED ECG REPORT I, Vanessa [place], the attending physician, personally viewed and interpreted this ECG.  Initial EKG I cannot interpret due to significant artifact.  Repeat EKG shows a ventricular paced rhythm with a widened QRS without scar Bosa criteria being met. ____________________________________________  RADIOLOGY Robert Bellow, personally viewed and evaluated these images (plain radiographs) as part of my medical decision making, as well as reviewing the written report by the radiologist.  ED MD interpretation: Bilateral opacifications  Official radiology report(s): DG Chest 2 View  Result Date: 12/30/2020 CLINICAL DATA:  Shortness of breath EXAM: CHEST - 2 VIEW COMPARISON:  11/09/2020 FINDINGS: Cardiomegaly left chest multi lead pacer. Scattered bilateral heterogeneous airspace opacities, improved compared to prior examination. The visualized skeletal structures are unremarkable. IMPRESSION: 1. Scattered bilateral heterogeneous airspace opacities, improved compared to prior examination, and consistent with improved infection and/or edema. 2.  Cardiomegaly. Electronically Signed   By: Eddie Candle M.D.   On: 12/27/2020 19:25    ____________________________________________   PROCEDURES  Procedure(s) performed (including Critical Care):  .Critical Care Performed by: Vanessa [place], MD Authorized by: Vanessa [place], MD   Critical care provider statement:    Critical care time (minutes):  35   Critical care was  necessary to treat or prevent imminent or life-threatening deterioration of the following conditions:  Respiratory failure   Critical care was time spent personally by me on the following activities:  Discussions with consultants, evaluation of patient's response to treatment, examination of patient, ordering and performing treatments and interventions, ordering and review of laboratory studies, ordering and review of radiographic studies, pulse oximetry, re-evaluation of patient's condition, obtaining history from patient or surrogate and review of old charts     ____________________________________________   INITIAL IMPRESSION / [place] / ED COURSE  Rhylynn SHERICA PATERNOSTRO was evaluated in Emergency Department on 12/20/2020 for the symptoms described in the history of present illness. She was evaluated in the context of the global COVID-19 pandemic, which necessitated consideration that the patient might be at risk for infection with the SARS-CoV-2 virus that causes COVID-19. Institutional protocols and algorithms that pertain to the evaluation of patients at risk for COVID-19 are in a state of rapid change based on information released by regulatory bodies including the CDC and federal and state organizations. These policies and algorithms were followed during the patient's care in the ED.    Patient is a 85 year old who comes in with confusion and shortness of breath.  For shortness of breath will get chest x-ray to evaluate for pneumonia, pneumothorax and Covid swab.  Will do EKG and cardiac markers to evaluate for ACS consider CT PE but patient does have CKD and she is on Xarelto and both her and her family state that she is very religious about taking her medications so unlikely to be pulmonary embolism.  With the confusion will get urine to evaluate  for UTI and will get CT head to make sure no signs of intercranial hemorrhage given she is on a blood thinner.  She denies any falls  however.  Chest x-ray had some opacifications bilaterally that was improved from prior.  On review of records patient had pulmonary edema back in December.  Patient states that her swelling has gone down significantly but she continues to have a cough and shortness of breath.  Cardiac markers elevated at 122  Hemoglobin is at baseline at 8  Kidney function at baseline at 1.5  Patient was ambulated and oxygen levels went down to 90-91% was working to breathing.  Patient was placed on 2 L her BNP is elevated from her baseline.  I suspect that she could have a little bit of CHF causing her shortness of breath given her chest x-ray.  Covid swab is still pending.  Patient be given Lasix 40 mg.  Her Covid swab was positive.  CT head was negative  I suspect her elevated troponin is from her CHF/Covid.  Patient has frequent nosebleeds and platelets are low some hesitant to start heparin.  Will discuss with the hospital team for admission for the above.         ____________________________________________   FINAL CLINICAL IMPRESSION(S) / ED DIAGNOSES   Final diagnoses:  COVID-19  Acute respiratory failure with hypoxia ([place])  Community acquired pneumonia, unspecified laterality  Acute on chronic heart failure, unspecified heart failure type ([place])      MEDICATIONS GIVEN DURING THIS VISIT:  Medications  furosemide (LASIX) injection 40 mg (has no administration in time range)  cefTRIAXone (ROCEPHIN) 2 g in sodium chloride 0.9 % 100 mL IVPB (has no administration in time range)  azithromycin (ZITHROMAX) tablet 500 mg (has no administration in time range)     ED Discharge Orders    None       Note:  This document was prepared using Dragon voice recognition software and may include unintentional dictation errors.   Vanessa [place], MD 01/06/2021 [phone]

## 2020-12-31 NOTE — ED Notes (Signed)
Charge nurse notified of troponin results, acuity level changed

## 2020-12-31 NOTE — ED Triage Notes (Signed)
Pt comes into the ED via EMS from home with c/o SOB for the past couple of months, with periods of confusion, pt lives alone. Daughter lives in [place]  156/60 [phone] 98-100%RA

## 2020-12-31 NOTE — Progress Notes (Signed)
Remdesivir - Pharmacy Brief Note   O:  CXR: Scattered bilateral heterogeneous airspace opacities, improved compared to prior examination, and consistent with improved infection and/or edema. SpO2: 92-99% on 2 L/min via [place]   A/P:  Remdesivir 200 mg IVPB once followed by 100 mg IVPB daily x 4 days.   Renda Rolls, PharmD, [facility] Jan 10, 2021 11:46 PM

## 2020-12-31 NOTE — H&P (Addendum)
Chief Complaint: Patient presents with worsening shortness of breath and generalized weakness HPI: Patient is a poor historian due to her age and condition. Cynthia Wallace is an 85 y.o. female with multiple medical comorbidities that include IgG myeloma, chronic anemia, chronic systolic CHF(unknwn LVEF) on chronic diuretics, atrial fibrillation on chronic anticoagulation, chronic pulmonary hypertension,CKD stage III, hypertension and hyperlipidemia. As per patient, over the past several months, she has had progressive worsening shortness of breath from her baseline, however over the past few days, she has felt extremely weak and tired with minimal exertion. She has had poor oral intake and poor appetite over the past few days. She is up-to-date with her vaccinations. Family decided to bring patient into the emergency room to be further evaluated after some concerns about altered mental status from her baseline. No reported falls at home. Denies any chest pain or palpitations. Denies fever or chills. On presentation, patient was noted to be positive for Covid. Due to patient being a poor historian and difficult to assess duration of her symptoms. Chest x-ray was concerning for bilateral heterogeneous space opacities. Interpreted to have improved compared to prior.  Past Medical History:  Diagnosis Date  . Acid reflux `  . Adenomatous colon polyp   . Anemia   . Arthritis   . Asthma   . Atrial fibrillation ([place])   . Bacteremia   . Bilateral leg edema   . Bilateral leg edema   . Bronchitis   . Cardiomyopathy ([place])   . CHF (congestive heart failure) ([place])   . Chickenpox   . Chronic a-fib ([place])   . Chronic atrial fibrillation ([place])   . Chronic pulmonary hypertension ([place])   . Coronary artery disease   . Dyspnea on exertion   . Fibrocystic breast disease   . Hyperlipidemia   . Hypertension   . Iron deficiency anemia   . Localized edema   . MGUS (monoclonal gammopathy of unknown significance)    . Neuropathy   . Osteoporosis   . Pelvic pain   . Pneumonia   . PUD (peptic ulcer disease)   . Severe tricuspid valve insufficiency   . Sick sinus syndrome ([place])   . Sleep apnea   . Tricuspid valve insufficiency   . Venous insufficiency of both lower extremities     Past Surgical History:  Procedure Laterality Date  . ABDOMINAL HYSTERECTOMY    . BILATERAL KNEE ARTHROSCOPY    . BREAST EXCISIONAL BIOPSY Right 1990   NEG  . BREAST SURGERY    . CARDIAC CATHETERIZATION    . COLONOSCOPY WITH PROPOFOL N/A 08/27/2016   Procedure: COLONOSCOPY WITH PROPOFOL;  Surgeon: Manya Silvas, MD;  Location: [facility] ENDOSCOPY;  Service: Endoscopy;  Laterality: N/A;  . COLONOSCOPY WITH PROPOFOL N/A 11/21/2020   Procedure: COLONOSCOPY WITH PROPOFOL;  Surgeon: Toledo, Benay Pike, MD;  Location: [facility] ENDOSCOPY;  Service: Gastroenterology;  Laterality: N/A;  . ESOPHAGOGASTRODUODENOSCOPY (EGD) WITH PROPOFOL N/A 11/21/2020   Procedure: ESOPHAGOGASTRODUODENOSCOPY (EGD) WITH PROPOFOL;  Surgeon: Toledo, Benay Pike, MD;  Location: [facility] ENDOSCOPY;  Service: Gastroenterology;  Laterality: N/A;  . INSERT / REPLACE / REMOVE PACEMAKER    . JOINT REPLACEMENT    . PACEMAKER INSERTION    . TUBAL LIGATION      Family History  Adopted: Yes  Problem Relation Age of Onset  . Hyperlipidemia Mother   . Hypertension Mother   . Heart disease Mother   . Heart disease Father   . Cancer Sister   . Breast  cancer Sister   . Stroke Sister   . Hyperlipidemia Sister   . Heart disease Sister    Social History:  reports that she has never smoked. She has never used smokeless tobacco. She reports that she does not drink alcohol and does not use drugs.  Allergies:  Allergies  Allergen Reactions  . Codeine Nausea Only  . Penicillin G Other (See Comments)    As a child    (Not in a hospital admission)   Results for orders placed or performed during the hospital encounter of 01-19-2021 (from the past 48 hour(s))  CBG  monitoring, ED     Status: Abnormal   Collection Time: 19-Jan-2021  6:50 PM  Result Value Ref Range   Glucose-Capillary 100 (H) 70 - 99 mg/dL    Comment: Glucose reference range applies only to samples taken after fasting for at least 8 hours.   Comment 1 Notify RN    Comment 2 Document in Chart   Basic metabolic panel     Status: Abnormal   Collection Time: 2021/01/19  7:02 PM  Result Value Ref Range   Sodium 137 135 - 145 mmol/L   Potassium 3.3 (L) 3.5 - 5.1 mmol/L   Chloride 104 98 - 111 mmol/L   CO2 21 (L) 22 - 32 mmol/L   Glucose, Bld 123 (H) 70 - 99 mg/dL    Comment: Glucose reference range applies only to samples taken after fasting for at least 8 hours.   BUN 27 (H) 8 - 23 mg/dL   Creatinine, Ser 1.50 (H) 0.44 - 1.00 mg/dL   Calcium 7.7 (L) 8.9 - 10.3 mg/dL   GFR, Estimated 35 (L) >60 mL/min    Comment: (NOTE) Calculated using the CKD-EPI Creatinine Equation (2021)    Anion gap 12 5 - 15    Comment: Performed at [facility], [address] [place], [place] [address]  CBC     Status: Abnormal   Collection Time: 19-Jan-2021  7:02 PM  Result Value Ref Range   WBC 6.8 4.0 - 10.5 K/uL   RBC 2.49 (L) 3.87 - 5.11 MIL/uL   Hemoglobin 8.0 (L) 12.0 - 15.0 g/dL   HCT 25.1 (L) 36.0 - 46.0 %   MCV 100.8 (H) 80.0 - 100.0 fL   MCH 32.1 26.0 - 34.0 pg   MCHC 31.9 30.0 - 36.0 g/dL   RDW 16.3 (H) 11.5 - 15.5 %   Platelets 75 (L) 150 - 400 K/uL    Comment: PLATELET COUNT CONFIRMED BY SMEAR Immature Platelet Fraction may be clinically indicated, consider ordering this additional test [id number]    nRBC 0.0 0.0 - 0.2 %    Comment: Performed at [facility], [address] [place], [place] [address]  Troponin I (High Sensitivity)     Status: Abnormal   Collection Time: 2021-01-19  7:02 PM  Result Value Ref Range   Troponin I (High Sensitivity) 122 (HH) <18 ng/L    Comment: CRITICAL RESULT CALLED TO, READ BACK BY AND VERIFIED WITH LISA THOMPSON @2011  2021-01-19  MJU (NOTE) Elevated high sensitivity troponin I (hsTnI) values and significant  changes across serial measurements may suggest ACS but many other  chronic and acute conditions are known to elevate hsTnI results.  Refer to the "Links" section for chest pain algorithms and additional  guidance. Performed at [facility], [address], [place], [place] [address]   Hepatic function panel     Status: Abnormal   Collection Time:  01/02/2021  7:02 PM  Result Value Ref Range   Total Protein 7.8 6.5 - 8.1 g/dL   Albumin 3.1 (L) 3.5 - 5.0 g/dL   AST 26 15 - 41 U/L   ALT 24 0 - 44 U/L   Alkaline Phosphatase 66 38 - 126 U/L   Total Bilirubin 3.0 (H) 0.3 - 1.2 mg/dL   Bilirubin, Direct 1.1 (H) 0.0 - 0.2 mg/dL   Indirect Bilirubin 1.9 (H) 0.3 - 0.9 mg/dL    Comment: Performed at [facility], [address] [place], [place] [address]  Magnesium     Status: Abnormal   Collection Time: 01/04/2021  7:02 PM  Result Value Ref Range   Magnesium 1.6 (L) 1.7 - 2.4 mg/dL    Comment: Performed at [facility], [address] [place], [place] [address]  Procalcitonin     Status: None   Collection Time: 01/05/2021  7:02 PM  Result Value Ref Range   Procalcitonin 1.42 ng/mL    Comment:        Interpretation: PCT > 0.5 ng/mL and <= 2 ng/mL: Systemic infection (sepsis) is possible, but other conditions are known to elevate PCT as well. (NOTE)       Sepsis PCT Algorithm           Lower Respiratory Tract                                      Infection PCT Algorithm    ----------------------------     ----------------------------         PCT < 0.25 ng/mL                PCT < 0.10 ng/mL          Strongly encourage             Strongly discourage   discontinuation of antibiotics    initiation of antibiotics    ----------------------------     -----------------------------       PCT 0.25 - 0.50 ng/mL            PCT 0.10 - 0.25 ng/mL               OR       >80% decrease in PCT             Discourage initiation of                                            antibiotics      Encourage discontinuation           of antibiotics    ----------------------------     -----------------------------         PCT >= 0.50 ng/mL              PCT 0.26 - 0.50 ng/mL                AND       <80% decrease in PCT             Encourage initiation of  antibiotics       Encourage continuation           of antibiotics    ----------------------------     -----------------------------        PCT >= 0.50 ng/mL                  PCT > 0.50 ng/mL               AND         increase in PCT                  Strongly encourage                                      initiation of antibiotics    Strongly encourage escalation           of antibiotics                                     -----------------------------                                           PCT <= 0.25 ng/mL                                                 OR                                        > 80% decrease in PCT                                      Discontinue / Do not initiate                                             antibiotics  Performed at [facility], [address] [place], [place] [address]   Brain natriuretic peptide     Status: Abnormal   Collection Time: 12/11/2020  8:51 PM  Result Value Ref Range   B Natriuretic Peptide 743.7 (H) 0.0 - 100.0 pg/mL    Comment: Performed at [facility], [address] [place], [place] [address]  Troponin I (High Sensitivity)     Status: Abnormal   Collection Time: 12/12/2020  9:08 PM  Result Value Ref Range   Troponin I (High Sensitivity) 143 (HH) <18 ng/L    Comment: CRITICAL VALUE NOTED. VALUE IS CONSISTENT WITH PREVIOUSLY REPORTED/CALLED VALUE HNM (NOTE) Elevated high sensitivity troponin I (hsTnI) values and significant  changes across serial measurements may suggest ACS but many other  chronic and acute  conditions are known to elevate hsTnI results.  Refer to the "Links" section for chest pain algorithms and additional  guidance. Performed at [facility], [address] [place],  [address]  SARS Coronavirus 2 by RT PCR (hospital order, performed in [facility] hospital lab) Nasopharyngeal Nasopharyngeal Swab     Status: Abnormal   Collection Time: 12/27/2020  9:08 PM   Specimen: Nasopharyngeal Swab  Result Value Ref Range   SARS Coronavirus 2 POSITIVE (A) NEGATIVE    Comment: RESULT CALLED TO, READ BACK BY AND VERIFIED WITH: NOAH CRIFFINTH AT 2233 ON 12/12/2020 BY SS (NOTE) SARS-CoV-2 target nucleic acids are DETECTED  SARS-CoV-2 RNA is generally detectable in upper respiratory specimens  during the acute phase of infection.  Positive results are indicative  of the presence of the identified virus, but do not rule out bacterial infection or co-infection with other pathogens not detected by the test.  Clinical correlation with patient history and  other diagnostic information is necessary to determine patient infection status.  The expected result is negative.  Fact Sheet for Patients:   BoilerBrush.com.cy   Fact Sheet for Healthcare Providers:   https://pope.com/    This test is not yet approved or cleared by the [place] FDA and  has been authorized for detection and/or diagnosis of SARS-CoV-2 by FDA under an Emergency Use Authorization (EUA).  This EUA will remain in effect (meaning th is test can be used) for the duration of  the COVID-19 declaration under Section 564(b)(1) of the Act, 21 U.S.C. section 360-bbb-3(b)(1), unless the authorization is terminated or revoked sooner.  Performed at [facility], [address] [place], [place] [address]   Urinalysis, Complete w Microscopic Nasopharyngeal Swab     Status: Abnormal   Collection Time: 12/23/2020  9:08 PM  Result Value Ref Range   Color,  Urine YELLOW (A) YELLOW   APPearance HAZY (A) CLEAR   Specific Gravity, Urine 1.013 1.005 - 1.030   pH 5.0 5.0 - 8.0   Glucose, UA NEGATIVE NEGATIVE mg/dL   Hgb urine dipstick SMALL (A) NEGATIVE   Bilirubin Urine NEGATIVE NEGATIVE   Ketones, ur NEGATIVE NEGATIVE mg/dL   Protein, ur 163 (A) NEGATIVE mg/dL   Nitrite NEGATIVE NEGATIVE   Leukocytes,Ua NEGATIVE NEGATIVE   RBC / HPF 0-5 0 - 5 RBC/hpf   WBC, UA 0-5 0 - 5 WBC/hpf   Bacteria, UA RARE (A) NONE SEEN   Squamous Epithelial / LPF 0-5 0 - 5   Mucus PRESENT    Amorphous Crystal PRESENT     Comment: Performed at [facility], [address] [place], [place] [address]   DG Chest 2 View  Result Date: 01/07/2021 CLINICAL DATA:  Shortness of breath EXAM: CHEST - 2 VIEW COMPARISON:  11/09/2020 FINDINGS: Cardiomegaly left chest multi lead pacer. Scattered bilateral heterogeneous airspace opacities, improved compared to prior examination. The visualized skeletal structures are unremarkable. IMPRESSION: 1. Scattered bilateral heterogeneous airspace opacities, improved compared to prior examination, and consistent with improved infection and/or edema. 2.  Cardiomegaly. Electronically Signed   By: Lauralyn Primes M.D.   On: 01/02/2021 19:25   CT Head Wo Contrast  Result Date: 12/30/2020 CLINICAL DATA:  Mental status changes EXAM: CT HEAD WITHOUT CONTRAST TECHNIQUE: Contiguous axial images were obtained from the base of the skull through the vertex without intravenous contrast. COMPARISON:  09/25/2016 FINDINGS: Brain: There is atrophy and chronic small vessel disease changes. No acute intracranial abnormality. Specifically, no hemorrhage, hydrocephalus, mass lesion, acute infarction, or significant intracranial injury. Vascular: No hyperdense vessel or unexpected calcification. Skull: No acute calvarial abnormality. Sinuses/Orbits: Air-fluid levels in the maxillary sinuses and sphenoid sinuses. Mucosal thickening throughout the ethmoid  air  cells. Other: None IMPRESSION: Atrophy, chronic microvascular disease. No acute intracranial abnormality. Acute on chronic pan sinusitis. Electronically Signed   By: Rolm Baptise M.D.   On: 2021-01-26 21:47    Review of Systems  Constitutional: Positive for activity change and fatigue.  HENT: Positive for congestion and sore throat.   Respiratory: Positive for cough, chest tightness and shortness of breath.   Gastrointestinal: Negative.   Genitourinary: Negative.   Musculoskeletal: Negative.   Neurological: Negative.     Blood pressure 140/70, pulse 70, temperature 99 F (37.2 C), temperature source Oral, resp. rate (!) 34, height 5\' 5"  (1.651 m), weight 90.7 kg, SpO2 96 %. Physical Exam Vitals reviewed.  Constitutional:      Comments: Acute ill elderly female. Exam was limited due to her condition.  HENT:     Head: Atraumatic.  Cardiovascular:     Rate and Rhythm: Regular rhythm. Tachycardia present.     Pulses: Decreased pulses.     Heart sounds: Murmur heard.    Pulmonary:     Breath sounds: Examination of the right-lower field reveals decreased breath sounds. Examination of the left-lower field reveals decreased breath sounds. Decreased breath sounds present.  Abdominal:     General: Bowel sounds are normal.     Palpations: Abdomen is soft.  Musculoskeletal:     Cervical back: Neck supple.  Skin:    General: Skin is warm and dry.  Neurological:     General: No focal deficit present.  Psychiatric:        Mood and Affect: Mood is anxious.        Behavior: Behavior normal.      Assessment/Plan COVID-19 pneumonia: Patient currently requiring oxygen supplementation. Due to her inability to establish duration of her symptoms. Patient will be initiated on Decadron and remdesivir per protocol for moderate pneumonia. Will monitor for symptomatic improvement. Encourage proning measures as tolerated by patient. Incentive spirometry as tolerated by patient. Monitor oxygen  saturations and wean down as tolerated. Guaifenesin as needed for cough symptoms. DC antibiotics as secondary bacterial infection is less likely.  Chronic systolic CHF: Care everywhere confirms systolic CHF. Unable to assess documents to evaluate left ventricular ejection fraction: Patient is on beta-blockers, ARB and diuretics as appropriate. We will continue with same. Chest x-ray suggest interval improvement. Due to labile blood pressure patient will be continue with IV Lasix at 20 mg daily. May increase to 40 mg as tolerated by patient's blood pressure.  Chronic atrial fibrillation on anticoagulation: Rate is controlled. Continue with anticoagulation with Xarelto. No evidence of any bleeding diathesis. Telemetry monitoring.  Chronic anemia: Most likely secondary to Ig G myeloma: Patient follows up with hematology as outpatient. Follow-up will be scheduled as outpatient. Will monitor H&H during the course of stay.  Elevated cardiac markers: Most likely due to demand ischemia from hypoxic respiratory failure in a patient with Covid. Patient is adequately anticoagulated. Cardiology input may be warranted. Echocardiogram in a.m.  Thrombocytopenia: Chronic  Acute encephalopathy: Resolved at the time of evaluation. CT scan on admission was negative for any acute intracranial normality.    [place], MD January 26, 2021, 11:42 PM

## 2020-12-31 NOTE — ED Notes (Signed)
Pt ambulated to toilet and back. Increase to WOB, but pt did remain steady enough on feet to not require assistance. SpO2 decreased from 96% to 91% RA, does recover to 96% after a minute or so.   EDP notified

## 2020-12-31 NOTE — ED Notes (Signed)
Pt lives by self, uses walker at home. Denies recent falls  Reports SOB, states she was seen for it recently with no improvement since. Reports home use of nebulizer since then, but denies relief. States productive cough with "green or red" sputum. Says it has been going on for a couple days.   Reports some chest tightness, worsening SOB with activity. Denies worsening at night. Pt somewhat mildly confused, but seems to be able to answer questions appropriately. Denies pain, diarrhea. States sometimes her medications make her nauseous, but that is not new

## 2021-01-01 ENCOUNTER — Inpatient Hospital Stay
Admit: 2021-01-01 | Discharge: 2021-01-01 | Disposition: A | Discharge status: Home / Self Care | Payer: PPO | Attending: Internal Medicine | Admitting: Internal Medicine

## 2021-01-01 DIAGNOSIS — D696 Thrombocytopenia, unspecified: Secondary | ICD-10-CM

## 2021-01-01 DIAGNOSIS — I5023 Acute on chronic systolic (congestive) heart failure: Secondary | ICD-10-CM

## 2021-01-01 LAB — CBC WITH DIFFERENTIAL/PLATELET
Abs Immature Granulocytes: 0.08 10*3/uL — ABNORMAL HIGH (ref 0.00–0.07)
Basophils Absolute: 0 10*3/uL (ref 0.0–0.1)
Basophils Relative: 0 %
Eosinophils Absolute: 0.2 10*3/uL (ref 0.0–0.5)
Eosinophils Relative: 3 %
HCT: 26 % — ABNORMAL LOW (ref 36.0–46.0)
Hemoglobin: 8.1 g/dL — ABNORMAL LOW (ref 12.0–15.0)
Immature Granulocytes: 2 %
Lymphocytes Relative: 8 %
Lymphs Abs: 0.4 10*3/uL — ABNORMAL LOW (ref 0.7–4.0)
MCH: 31.8 pg (ref 26.0–34.0)
MCHC: 31.2 g/dL (ref 30.0–36.0)
MCV: 102 fL — ABNORMAL HIGH (ref 80.0–100.0)
Monocytes Absolute: 0.2 10*3/uL (ref 0.1–1.0)
Monocytes Relative: 3 %
Neutro Abs: 4.6 10*3/uL (ref 1.7–7.7)
Neutrophils Relative %: 84 %
Platelets: 70 10*3/uL — ABNORMAL LOW (ref 150–400)
RBC: 2.55 MIL/uL — ABNORMAL LOW (ref 3.87–5.11)
RDW: 16.7 % — ABNORMAL HIGH (ref 11.5–15.5)
Smear Review: NORMAL
WBC: 5.4 10*3/uL (ref 4.0–10.5)
nRBC: 0 % (ref 0.0–0.2)

## 2021-01-01 LAB — C-REACTIVE PROTEIN: CRP: 16.9 mg/dL — ABNORMAL HIGH (ref <1.0)

## 2021-01-01 LAB — COMPREHENSIVE METABOLIC PANEL
ALT: 23 U/L (ref 0–44)
AST: 30 U/L (ref 15–41)
Albumin: 3.1 g/dL — ABNORMAL LOW (ref 3.5–5.0)
Alkaline Phosphatase: 66 U/L (ref 38–126)
Anion gap: 11 (ref 5–15)
BUN: 27 mg/dL — ABNORMAL HIGH (ref 8–23)
CO2: 22 mmol/L (ref 22–32)
Calcium: 7.7 mg/dL — ABNORMAL LOW (ref 8.9–10.3)
Chloride: 104 mmol/L (ref 98–111)
Creatinine, Ser: 1.51 mg/dL — ABNORMAL HIGH (ref 0.44–1.00)
GFR, Estimated: 34 mL/min — ABNORMAL LOW (ref >60)
Glucose, Bld: 158 mg/dL — ABNORMAL HIGH (ref 70–99)
Potassium: 3.6 mmol/L (ref 3.5–5.1)
Sodium: 137 mmol/L (ref 135–145)
Total Bilirubin: 2.7 mg/dL — ABNORMAL HIGH (ref 0.3–1.2)
Total Protein: 8.1 g/dL (ref 6.5–8.1)

## 2021-01-01 LAB — ECHOCARDIOGRAM COMPLETE
AR max vel: 1.27 cm2
AV Area VTI: 1.39 cm2
AV Area mean vel: 1.29 cm2
AV Mean grad: 7 mmHg
AV Peak grad: 13.8 mmHg
Ao pk vel: 1.86 m/s
Area-P 1/2: 2.94 cm2
Height: 65 in
MV VTI: 1.43 cm2
S' Lateral: 2.9 cm
Weight: 3200 oz

## 2021-01-01 LAB — PROCALCITONIN: Procalcitonin: 1.8 ng/mL

## 2021-01-01 LAB — FIBRIN DERIVATIVES D-DIMER (ARMC ONLY): Fibrin derivatives D-dimer (ARMC): 2428.5 ng/mL (FEU) — ABNORMAL HIGH (ref 0.00–499.00)

## 2021-01-01 MED ORDER — RIVAROXABAN 15 MG PO TABS
15.0000 mg | ORAL_TABLET | Freq: Every day | ORAL | Status: DC
  Administered 2021-01-01 – 2021-01-02 (×2): 15 mg via ORAL
  Filled 2021-01-01 (×3): qty 1

## 2021-01-01 MED ORDER — ALBUTEROL SULFATE HFA 108 (90 BASE) MCG/ACT IN AERS
1.0000 | INHALATION_SPRAY | Freq: Four times a day (QID) | RESPIRATORY_TRACT | Status: DC
  Administered 2021-01-01 – 2021-01-03 (×8): 2 via RESPIRATORY_TRACT
  Filled 2021-01-01: qty 6.7

## 2021-01-01 MED ORDER — FUROSEMIDE 10 MG/ML IJ SOLN
20.0000 mg | Freq: Every day | INTRAMUSCULAR | Status: DC
  Administered 2021-01-02: 10:00:00 20 mg via INTRAVENOUS
  Filled 2021-01-01: qty 2

## 2021-01-01 MED ORDER — METOPROLOL SUCCINATE ER 50 MG PO TB24
50.0000 mg | ORAL_TABLET | Freq: Two times a day (BID) | ORAL | Status: DC
  Administered 2021-01-01 – 2021-01-03 (×6): 50 mg via ORAL
  Filled 2021-01-01 (×7): qty 1

## 2021-01-01 MED ORDER — SODIUM CHLORIDE 0.9 % IV SOLN
500.0000 mg | INTRAVENOUS | Status: DC
  Administered 2021-01-01 – 2021-01-04 (×3): 500 mg via INTRAVENOUS
  Filled 2021-01-01 (×6): qty 500

## 2021-01-01 MED ORDER — SODIUM CHLORIDE 0.9 % IV SOLN
2.0000 g | INTRAVENOUS | Status: AC
  Administered 2021-01-01 – 2021-01-04 (×4): 2 g via INTRAVENOUS
  Filled 2021-01-01: qty 20
  Filled 2021-01-01: qty 2
  Filled 2021-01-01: qty 20
  Filled 2021-01-01: qty 2
  Filled 2021-01-01: qty 20

## 2021-01-01 NOTE — Progress Notes (Signed)
*  PRELIMINARY RESULTS* Echocardiogram 2D Echocardiogram has been performed.  Cynthia Wallace 01/01/2021, 10:03 AM

## 2021-01-01 NOTE — ED Notes (Signed)
Pt noted to have episode of coughing up dark brown/bloody sputum. This RN also conferred with pharmacy regarding pt's Revlimid. Per pharmacist review, medication in setting of Covid 19 has increased risk of thromboembolic event, Dr. Kurtis Bushman made aware of new findings. Awaiting orders at this time.

## 2021-01-01 NOTE — ED Notes (Signed)
Pt repositioned for comfort

## 2021-01-01 NOTE — ED Notes (Signed)
Report given to Anna RN.

## 2021-01-01 NOTE — ED Notes (Signed)
Called lab to draw pt blood work

## 2021-01-01 NOTE — ED Notes (Signed)
Pt to room 41 by Radonna Ricker, RN. Pt changed into clean and dry brief/chuck pad prior to transport to room. Pt placed back on monitor once in the room. Call bell within reach of patient at this time.

## 2021-01-01 NOTE — ED Notes (Signed)
Pt continues to rest in bed with eyes closed, breakfast tray placed at bedside at this time. Pt remains on 2L via [place] at this time.

## 2021-01-01 NOTE — ED Notes (Signed)
Pt's daughter [employer] [phone], called and updated with patient permission.

## 2021-01-01 NOTE — ED Notes (Signed)
Pt sitting up in bed eating lunch at this time. Explained delay to patient with medications.

## 2021-01-01 NOTE — ED Notes (Signed)
Pt sheets changed by this RN after soiled sheets noted with urine. Pt noted to be become increasingly dyspneic after being changed. Pt with O2 sats 94% on 2L however increased Respiratory rate noted at this time and labored breathing noted. Admitting MD notified regarding change in status, awaiting orders.

## 2021-01-01 NOTE — Progress Notes (Signed)
PROGRESS NOTE    Cynthia Wallace  [id number] DOB: May 22, 1936 DOA: 12/11/2020 PCP: Idelle Crouch, MD    Brief Narrative:  Cynthia Wallace is an 85 y.o. female with multiple medical comorbidities that include IgG myeloma, chronic anemia, chronic systolic CHF(unknwn LVEF) on chronic diuretics, atrial fibrillation on chronic anticoagulation, chronic pulmonary hypertension,CKD stage III, hypertension and hyperlipidemia. As per patient, over the past several months, she has had progressive worsening shortness of breath from her baseline, however over the past few days, she has felt extremely weak and tired with minimal exertion. She has had poor oral intake and poor appetite over the past few days. She is up-to-date with her vaccinations. Family decided to bring patient into the emergency room to be further evaluated after some concerns about altered mental status from her baseline. No reported falls at home. patient was noted to be positive for Covid.  Chest x-ray was concerning for bilateral heterogeneous space opacities. Interpreted to have improved compared to prior. Being tx for covid PNA. And also CHF.   1/25-MS improved.     Consultants:     Procedures:   Antimicrobials:   CEFTRIAXONE AZITHRO   Subjective: Eating breakfast, no sob, cp, dizziness.  Objective: Vitals:   01/01/21 0400 01/01/21 0548 01/01/21 0630 01/01/21 0800  BP: 120/62 (!) 135/58 (!) 110/58 (!) 92/47  Pulse:  78 69 68  Resp: (!) 23 (!) 25 (!) 23 (!) 24  Temp:      TempSrc:      SpO2: 94% 97% 97% 98%  Weight:      Height:        Intake/Output Summary (Last 24 hours) at 01/01/2021 0917 Last data filed at 01/01/2021 0840 Gross per 24 hour  Intake --  Output 700 ml  Net -700 ml   Filed Weights   01/07/2021 1858  Weight: 90.7 kg    Examination:  General exam: Appears calm and comfortable  Respiratory system: mild rales b/l no wheezing Cardiovascular system: S1 & S2 heard, RRR. No JVD, murmurs,  rubs, gallops or clicks.  Gastrointestinal system: Abdomen is nondistended, soft and nontender. Normal bowel sounds heard. Central nervous system: Alert and oriented x3, grossly intact Extremities: trace pedal edema b/l Skin:warm, dry Psychiatry:  Mood & affect appropriate.     Data Reviewed: I have personally reviewed following labs and imaging studies  CBC: Recent Labs  Lab 12/10/2020 1902 01/01/21 0115  WBC 6.8 5.4  NEUTROABS  --  4.6  HGB 8.0* 8.1*  HCT 25.1* 26.0*  MCV 100.8* 102.0*  PLT 75* 70*   Basic Metabolic Panel: Recent Labs  Lab 01/05/2021 1902 01/01/21 0115  NA 137 137  K 3.3* 3.6  CL 104 104  CO2 21* 22  GLUCOSE 123* 158*  BUN 27* 27*  CREATININE 1.50* 1.51*  CALCIUM 7.7* 7.7*  MG 1.6*  --    GFR: Estimated Creatinine Clearance: 32 mL/min (A) (by C-G formula based on SCr of 1.51 mg/dL (H)). Liver Function Tests: Recent Labs  Lab 12/08/2020 1902 01/01/21 0115  AST 26 30  ALT 24 23  ALKPHOS 66 66  BILITOT 3.0* 2.7*  PROT 7.8 8.1  ALBUMIN 3.1* 3.1*   No results for input(s): LIPASE, AMYLASE in the last 168 hours. No results for input(s): AMMONIA in the last 168 hours. Coagulation Profile: No results for input(s): INR, PROTIME in the last 168 hours. Cardiac Enzymes: No results for input(s): CKTOTAL, CKMB, CKMBINDEX, TROPONINI in the last 168 hours. BNP (last 3  results) No results for input(s): PROBNP in the last 8760 hours. HbA1C: No results for input(s): HGBA1C in the last 72 hours. CBG: Recent Labs  Lab 12/20/2020 1850  GLUCAP 100*   Lipid Profile: No results for input(s): CHOL, HDL, LDLCALC, TRIG, CHOLHDL, LDLDIRECT in the last 72 hours. Thyroid Function Tests: No results for input(s): TSH, T4TOTAL, FREET4, T3FREE, THYROIDAB in the last 72 hours. Anemia Panel: No results for input(s): VITAMINB12, FOLATE, FERRITIN, TIBC, IRON, RETICCTPCT in the last 72 hours. Sepsis Labs: Recent Labs  Lab 12/13/2020 1902 01/01/21 0115  PROCALCITON 1.42  1.80    Recent Results (from the past 240 hour(s))  SARS Coronavirus 2 by RT PCR (hospital order, performed in [facility] hospital lab) Nasopharyngeal Nasopharyngeal Swab     Status: Abnormal   Collection Time: 12/13/2020  9:08 PM   Specimen: Nasopharyngeal Swab  Result Value Ref Range Status   SARS Coronavirus 2 POSITIVE (A) NEGATIVE Final    Comment: RESULT CALLED TO, READ BACK BY AND VERIFIED WITH: NOAH CRIFFINTH AT 2233 ON 01/01/2021 BY SS (NOTE) SARS-CoV-2 target nucleic acids are DETECTED  SARS-CoV-2 RNA is generally detectable in upper respiratory specimens  during the acute phase of infection.  Positive results are indicative  of the presence of the identified virus, but do not rule out bacterial infection or co-infection with other pathogens not detected by the test.  Clinical correlation with patient history and  other diagnostic information is necessary to determine patient infection status.  The expected result is negative.  Fact Sheet for Patients:   StrictlyIdeas.no   Fact Sheet for Healthcare Providers:   BankingDealers.co.za    This test is not yet approved or cleared by the [place] FDA and  has been authorized for detection and/or diagnosis of SARS-CoV-2 by FDA under an Emergency Use Authorization (EUA).  This EUA will remain in effect (meaning th is test can be used) for the duration of  the COVID-19 declaration under Section 564(b)(1) of the Act, 21 U.S.C. section 360-bbb-3(b)(1), unless the authorization is terminated or revoked sooner.  Performed at [facility], [address] [place], [place] [address]   Blood culture (routine x 2)     Status: None (Preliminary result)   Collection Time: 12/08/2020 10:50 PM   Specimen: BLOOD  Result Value Ref Range Status   Specimen Description BLOOD RIGHT ASSIST CONTROL  Final   Special Requests   Final    BOTTLES DRAWN AEROBIC AND ANAEROBIC Blood Culture  adequate volume   Culture   Final    NO GROWTH < 12 HOURS Performed at [facility], [address], [place], [place] [address]    Report Status PENDING  Incomplete  Blood culture (routine x 2)     Status: None (Preliminary result)   Collection Time: 01/04/2021 10:51 PM   Specimen: BLOOD  Result Value Ref Range Status   Specimen Description BLOOD LEFT ASSIST CONTROL  Final   Special Requests   Final    BOTTLES DRAWN AEROBIC AND ANAEROBIC Blood Culture adequate volume   Culture   Final    NO GROWTH < 12 HOURS Performed at [facility], [address], [place], [place] [address]    Report Status PENDING  Incomplete         Radiology Studies: DG Chest 2 View  Result Date: 12/22/2020 CLINICAL DATA:  Shortness of breath EXAM: CHEST - 2 VIEW COMPARISON:  11/09/2020 FINDINGS: Cardiomegaly left chest multi lead pacer. Scattered bilateral heterogeneous airspace  opacities, improved compared to prior examination. The visualized skeletal structures are unremarkable. IMPRESSION: 1. Scattered bilateral heterogeneous airspace opacities, improved compared to prior examination, and consistent with improved infection and/or edema. 2.  Cardiomegaly. Electronically Signed   By: Eddie Candle M.D.   On: 01/04/2021 19:25   CT Head Wo Contrast  Result Date: 12/30/2020 CLINICAL DATA:  Mental status changes EXAM: CT HEAD WITHOUT CONTRAST TECHNIQUE: Contiguous axial images were obtained from the base of the skull through the vertex without intravenous contrast. COMPARISON:  09/25/2016 FINDINGS: Brain: There is atrophy and chronic small vessel disease changes. No acute intracranial abnormality. Specifically, no hemorrhage, hydrocephalus, mass lesion, acute infarction, or significant intracranial injury. Vascular: No hyperdense vessel or unexpected calcification. Skull: No acute calvarial abnormality. Sinuses/Orbits: Air-fluid levels in the maxillary sinuses and sphenoid sinuses. Mucosal  thickening throughout the ethmoid air cells. Other: None IMPRESSION: Atrophy, chronic microvascular disease. No acute intracranial abnormality. Acute on chronic pan sinusitis. Electronically Signed   By: Rolm Baptise M.D.   On: 12/20/2020 21:47        Scheduled Meds: . aspirin EC  81 mg Oral Daily  . atorvastatin  10 mg Oral Daily  . dexamethasone (DECADRON) injection  6 mg Intravenous Q24H  . fluticasone  1 spray Each Nare Daily  . furosemide  20 mg Intravenous Daily  . hydrOXYzine  10 mg Oral TID  . lenalidomide  15 mg Oral Daily  . metoprolol succinate  50 mg Oral BID  . montelukast  10 mg Oral QHS  . multivitamin with minerals  1 tablet Oral Daily  . pantoprazole  40 mg Oral Daily  . rivaroxaban  20 mg Oral Q supper  . traZODone  50 mg Oral QHS  . vitamin C  125 mg Oral Daily   Continuous Infusions: . remdesivir 100 mg in NS 100 mL      Assessment & Plan:   Active Problems:   Pneumonia due to COVID-19 virus   COVID-19 pneumonia: Patient currently requiring oxygen supplementation. Procalcitonin elevated Pt was start on iv abx with ceft. And azith Decadron and Remdesivir was initiated, will continue IS Flutter valves Keep 02sat >92% Monitor inflammatory markers  A/Chronic diastolic HF: Care everywhere confirms systolic CHF. Echo here with nml EF bnp elevated Continue iv lasix  Monitor renal function   Chronic atrial fibrillation on anticoagulation:  Rate controlled Continue xarelto Tele   Elevated TP- 2/2 demand ischemia from PNA Trend tp Echo nml EF.  Chronic anemia: Most likely secondary to Ig G myeloma: Patient follows up with hematology as outpatient. Follow-up will be scheduled as outpatient. Will monitor H&H during the course of stay.    Thrombocytopenia: Chronic monitor   Acute encephalopathy:likely metabolic due to covid pna. Resolved at admission  CT scan on admission was negative for any acute intracranial normality.   DVT  prophylaxis: xarelto Code Status:full Family Communication: updated daughter  Status is: Inpatient  Remains inpatient appropriate because:Inpatient level of care appropriate due to severity of illness   Dispo: The patient is from: Home              Anticipated d/c is to: TBD              Anticipated d/c date is: 3 days              Patient currently is not medically stable to d/c.   Difficult to place patient No            LOS:  1 day   Time spent: 35 min with > 50% on coc     Nolberto Hanlon, MD [facility] Pager [phone]  If 7PM-7AM, please contact night-coverage 01/01/2021, 9:17 AM

## 2021-01-01 NOTE — ED Notes (Signed)
Admitting MD messaged back regarding this RN concerns regarding medication and sputum color. States will review chart.

## 2021-01-01 NOTE — ED Notes (Signed)
Report received from Russell RN. Patient care assumed. Patient/RN introduction complete. Will continue to monitor.  

## 2021-01-01 NOTE — ED Notes (Signed)
Report given to Megan, RN.

## 2021-01-01 NOTE — ED Notes (Signed)
Pt repositioned in bed for comfort. Lights dimmed per patient request for comfort. Pt denies further needs at this time. Call bell remains within reach.

## 2021-01-02 ENCOUNTER — Encounter (INDEPENDENT_AMBULATORY_CARE_PROVIDER_SITE_OTHER): Payer: Self-pay | Admitting: Nurse Practitioner

## 2021-01-02 ENCOUNTER — Inpatient Hospital Stay: Payer: PPO

## 2021-01-02 DIAGNOSIS — D61818 Other pancytopenia: Secondary | ICD-10-CM

## 2021-01-02 DIAGNOSIS — J9601 Acute respiratory failure with hypoxia: Secondary | ICD-10-CM

## 2021-01-02 LAB — BLOOD GAS, ARTERIAL
Acid-base deficit: 2.2 mmol/L — ABNORMAL HIGH (ref 0.0–2.0)
Bicarbonate: 22.2 mmol/L (ref 20.0–28.0)
FIO2: 1
O2 Saturation: 99 %
Patient temperature: 37
pCO2 arterial: 35 mmHg (ref 32.0–48.0)
pH, Arterial: 7.41 (ref 7.350–7.450)
pO2, Arterial: 131 mmHg — ABNORMAL HIGH (ref 83.0–108.0)

## 2021-01-02 LAB — URINE CULTURE: Culture: <10000 — AB

## 2021-01-02 LAB — COMPREHENSIVE METABOLIC PANEL
ALT: 25 U/L (ref 0–44)
AST: 36 U/L (ref 15–41)
Albumin: 2.8 g/dL — ABNORMAL LOW (ref 3.5–5.0)
Alkaline Phosphatase: 57 U/L (ref 38–126)
Anion gap: 11 (ref 5–15)
BUN: 35 mg/dL — ABNORMAL HIGH (ref 8–23)
CO2: 20 mmol/L — ABNORMAL LOW (ref 22–32)
Calcium: 7.5 mg/dL — ABNORMAL LOW (ref 8.9–10.3)
Chloride: 109 mmol/L (ref 98–111)
Creatinine, Ser: 1.21 mg/dL — ABNORMAL HIGH (ref 0.44–1.00)
GFR, Estimated: 45 mL/min — ABNORMAL LOW (ref >60)
Glucose, Bld: 132 mg/dL — ABNORMAL HIGH (ref 70–99)
Potassium: 3.2 mmol/L — ABNORMAL LOW (ref 3.5–5.1)
Sodium: 140 mmol/L (ref 135–145)
Total Bilirubin: 1.3 mg/dL — ABNORMAL HIGH (ref 0.3–1.2)
Total Protein: 7.4 g/dL (ref 6.5–8.1)

## 2021-01-02 LAB — CBC WITH DIFFERENTIAL/PLATELET
Abs Immature Granulocytes: 0.06 10*3/uL (ref 0.00–0.07)
Basophils Absolute: 0 10*3/uL (ref 0.0–0.1)
Basophils Relative: 0 %
Eosinophils Absolute: 0 10*3/uL (ref 0.0–0.5)
Eosinophils Relative: 0 %
HCT: 24.2 % — ABNORMAL LOW (ref 36.0–46.0)
Hemoglobin: 8 g/dL — ABNORMAL LOW (ref 12.0–15.0)
Immature Granulocytes: 2 %
Lymphocytes Relative: 5 %
Lymphs Abs: 0.2 10*3/uL — ABNORMAL LOW (ref 0.7–4.0)
MCH: 32.9 pg (ref 26.0–34.0)
MCHC: 33.1 g/dL (ref 30.0–36.0)
MCV: 99.6 fL (ref 80.0–100.0)
Monocytes Absolute: 0.1 10*3/uL (ref 0.1–1.0)
Monocytes Relative: 4 %
Neutro Abs: 3.4 10*3/uL (ref 1.7–7.7)
Neutrophils Relative %: 89 %
Platelets: 70 10*3/uL — ABNORMAL LOW (ref 150–400)
RBC: 2.43 MIL/uL — ABNORMAL LOW (ref 3.87–5.11)
RDW: 16.6 % — ABNORMAL HIGH (ref 11.5–15.5)
Smear Review: NORMAL
WBC: 3.8 10*3/uL — ABNORMAL LOW (ref 4.0–10.5)
nRBC: 0 % (ref 0.0–0.2)

## 2021-01-02 LAB — C-REACTIVE PROTEIN
CRP: 18.2 mg/dL — ABNORMAL HIGH (ref <1.0)
CRP: 17.6 mg/dL — ABNORMAL HIGH (ref <1.0)

## 2021-01-02 LAB — PROCALCITONIN: Procalcitonin: 1.36 ng/mL

## 2021-01-02 LAB — FIBRIN DERIVATIVES D-DIMER (ARMC ONLY): Fibrin derivatives D-dimer (ARMC): 2415.27 ng/mL (FEU) — ABNORMAL HIGH (ref 0.00–499.00)

## 2021-01-02 MED ORDER — FUROSEMIDE 10 MG/ML IJ SOLN
40.0000 mg | Freq: Once | INTRAMUSCULAR | Status: AC
  Administered 2021-01-02: 40 mg via INTRAVENOUS
  Filled 2021-01-02: qty 4

## 2021-01-02 MED ORDER — FUROSEMIDE 10 MG/ML IJ SOLN
20.0000 mg | Freq: Once | INTRAMUSCULAR | Status: AC
  Administered 2021-01-02: 20 mg via INTRAVENOUS
  Filled 2021-01-02: qty 2

## 2021-01-02 MED ORDER — POTASSIUM CHLORIDE CRYS ER 20 MEQ PO TBCR
40.0000 meq | EXTENDED_RELEASE_TABLET | ORAL | Status: AC
  Administered 2021-01-02 (×2): 40 meq via ORAL
  Filled 2021-01-02 (×2): qty 2

## 2021-01-02 MED ORDER — IOHEXOL 350 MG/ML SOLN
75.0000 mL | Freq: Once | INTRAVENOUS | Status: AC | PRN
  Administered 2021-01-02: 75 mL via INTRAVENOUS

## 2021-01-02 MED ORDER — FUROSEMIDE 10 MG/ML IJ SOLN: 40.0000 mg | Freq: Every day | INTRAMUSCULAR | Status: DC

## 2021-01-02 MED ORDER — METHYLPREDNISOLONE SODIUM SUCC 125 MG IJ SOLR
60.0000 mg | Freq: Two times a day (BID) | INTRAMUSCULAR | Status: DC
  Administered 2021-01-02 – 2021-01-10 (×16): 60 mg via INTRAVENOUS
  Filled 2021-01-02 (×16): qty 2

## 2021-01-02 NOTE — Consult Note (Signed)
Pulmonary Medicine          Date: 01/02/2021,   MRN# [id number] Cynthia Wallace 13-May-1936     AdmissionWeight: 90.7 kg                 CurrentWeight: 90.7 kg   Referring physician: Dr Remi Haggard    CHIEF COMPLAINT:   Acute hypoxemic respiratory failure due to [place]   As per admission h/p 85 year old female with history of IgG myeloma currently on treatment, chronic anemia, chronic diastolic CHF on chronic diuretics, chronic atrial fibrillation on anticoagulation, chronic pulmonary hypertension, CKD stage III, hypertension and hyperlipidemia presented with worsening shortness of breath.  Chest x-ray on presentation showed bilateral opacities with cardiomegaly and pulmonary edema worse on left with indistinct left heart border.  She was found to be positive for Covid.  She was started on Decadron, remdesivir, antibiotics and Lasix. Due to increased O2 requirement on 13L/min [facility] PCCM was consulted for additional evaluation and management.    PAST MEDICAL HISTORY   Past Medical History:  Diagnosis Date  . Acid reflux `  . Adenomatous colon polyp   . Anemia   . Arthritis   . Asthma   . Atrial fibrillation ([place])   . Bacteremia   . Bilateral leg edema   . Bilateral leg edema   . Bronchitis   . Cardiomyopathy ([place])   . CHF (congestive heart failure) ([place])   . Chickenpox   . Chronic a-fib ([place])   . Chronic atrial fibrillation ([place])   . Chronic pulmonary hypertension ([place])   . Coronary artery disease   . Dyspnea on exertion   . Fibrocystic breast disease   . Hyperlipidemia   . Hypertension   . Iron deficiency anemia   . Localized edema   . MGUS (monoclonal gammopathy of unknown significance)   . Neuropathy   . Osteoporosis   . Pelvic pain   . Pneumonia   . PUD (peptic ulcer disease)   . Severe tricuspid valve insufficiency   . Sick sinus syndrome ([place])   . Sleep apnea   . Tricuspid valve insufficiency   . Venous  insufficiency of both lower extremities      SURGICAL HISTORY   Past Surgical History:  Procedure Laterality Date  . ABDOMINAL HYSTERECTOMY    . BILATERAL KNEE ARTHROSCOPY    . BREAST EXCISIONAL BIOPSY Right 1990   NEG  . BREAST SURGERY    . CARDIAC CATHETERIZATION    . COLONOSCOPY WITH PROPOFOL N/A 08/27/2016   Procedure: COLONOSCOPY WITH PROPOFOL;  Surgeon: Manya Silvas, MD;  Location: [facility] ENDOSCOPY;  Service: Endoscopy;  Laterality: N/A;  . COLONOSCOPY WITH PROPOFOL N/A 11/21/2020   Procedure: COLONOSCOPY WITH PROPOFOL;  Surgeon: Toledo, Benay Pike, MD;  Location: [facility] ENDOSCOPY;  Service: Gastroenterology;  Laterality: N/A;  . ESOPHAGOGASTRODUODENOSCOPY (EGD) WITH PROPOFOL N/A 11/21/2020   Procedure: ESOPHAGOGASTRODUODENOSCOPY (EGD) WITH PROPOFOL;  Surgeon: Toledo, Benay Pike, MD;  Location: [facility] ENDOSCOPY;  Service: Gastroenterology;  Laterality: N/A;  . INSERT / REPLACE / REMOVE PACEMAKER    . JOINT REPLACEMENT    . PACEMAKER INSERTION    . TUBAL LIGATION       FAMILY HISTORY   Family History  Adopted: Yes  Problem Relation Age of Onset  . Hyperlipidemia Mother   . Hypertension Mother   . Heart disease Mother   . Heart disease Father   . Cancer Sister   . Breast cancer Sister   .  Stroke Sister   . Hyperlipidemia Sister   . Heart disease Sister      SOCIAL HISTORY   Social History   Tobacco Use  . Smoking status: Never Smoker  . Smokeless tobacco: Never Used  Vaping Use  . Vaping Use: Never used  Substance Use Topics  . Alcohol use: No  . Drug use: No     MEDICATIONS    Home Medication:    Current Medication:  Current Facility-Administered Medications:  .  acetaminophen (TYLENOL) tablet 650 mg, 650 mg, Oral, Q6H PRN, Acheampong, Warnell Bureau, MD .  albuterol (VENTOLIN HFA) 108 (90 Base) MCG/ACT inhaler 1-2 puff, 1-2 puff, Inhalation, Q6H, Nolberto Hanlon, MD, 2 puff at 01/02/21 1356 .  aspirin EC tablet 81 mg, 81 mg, Oral, Daily, Acheampong, Warnell Bureau, MD, 81 mg at 01/02/21 1002 .  atorvastatin (LIPITOR) tablet 10 mg, 10 mg, Oral, Daily, Acheampong, Warnell Bureau, MD, 10 mg at 01/02/21 1000 .  azithromycin (ZITHROMAX) 500 mg in sodium chloride 0.9 % 250 mL IVPB, 500 mg, Intravenous, Q24H, Nolberto Hanlon, MD, Stopped at 01/02/21 0002 .  cefTRIAXone (ROCEPHIN) 2 g in sodium chloride 0.9 % 100 mL IVPB, 2 g, Intravenous, Q24H, Nolberto Hanlon, MD, Stopped at 01/01/21 2203 .  fluticasone (FLONASE) 50 MCG/ACT nasal spray 1 spray, 1 spray, Each Nare, Daily, Acheampong, Warnell Bureau, MD, 1 spray at 01/02/21 1003 .  [START ON 01/03/2021] furosemide (LASIX) injection 40 mg, 40 mg, Intravenous, Daily, Alekh, Kshitiz, MD .  guaiFENesin-dextromethorphan (ROBITUSSIN DM) 100-10 MG/5ML syrup 10 mL, 10 mL, Oral, Q4H PRN, Acheampong, Warnell Bureau, MD, 10 mL at 01/02/21 1355 .  hydrOXYzine (ATARAX/VISTARIL) tablet 10 mg, 10 mg, Oral, TID, Acheampong, Warnell Bureau, MD, 10 mg at 01/02/21 1002 .  lenalidomide (REVLIMID) capsule 15 mg, 15 mg, Oral, Daily, Acheampong, Warnell Bureau, MD .  methylPREDNISolone sodium succinate (SOLU-MEDROL) 125 mg/2 mL injection 60 mg, 60 mg, Intravenous, Q12H, Alekh, Kshitiz, MD, 60 mg at 01/02/21 1356 .  metoprolol succinate (TOPROL-XL) 24 hr tablet 50 mg, 50 mg, Oral, BID, Acheampong, Warnell Bureau, MD, 50 mg at 01/02/21 1002 .  montelukast (SINGULAIR) tablet 10 mg, 10 mg, Oral, QHS, Acheampong, Warnell Bureau, MD, 10 mg at 01/01/21 2124 .  multivitamin with minerals tablet 1 tablet, 1 tablet, Oral, Daily, Acheampong, Warnell Bureau, MD, 1 tablet at 01/02/21 1001 .  pantoprazole (PROTONIX) EC tablet 40 mg, 40 mg, Oral, Daily, Acheampong, Warnell Bureau, MD, 40 mg at 01/02/21 1001 .  [COMPLETED] remdesivir 200 mg in sodium chloride 0.9% 250 mL IVPB, 200 mg, Intravenous, Once, Stopped at 01/01/21 0231 **FOLLOWED BY** remdesivir 100 mg in sodium chloride 0.9 % 100 mL IVPB, 100 mg, Intravenous, Daily, Acheampong, Warnell Bureau, MD, Last Rate: 200 mL/hr at 01/02/21 1006, 100 mg at 01/02/21 1006 .   Rivaroxaban (XARELTO) tablet 15 mg, 15 mg, Oral, Q supper, Nolberto Hanlon, MD, 15 mg at 01/01/21 1827 .  senna-docusate (Senokot-S) tablet 1 tablet, 1 tablet, Oral, QHS PRN, Acheampong, Warnell Bureau, MD .  traMADol (ULTRAM) tablet 50 mg, 50 mg, Oral, Q6H PRN, Acheampong, Warnell Bureau, MD, 50 mg at 01/02/21 1357 .  traZODone (DESYREL) tablet 50 mg, 50 mg, Oral, QHS, Acheampong, Warnell Bureau, MD, 50 mg at 01/01/21 2124 .  vitamin C (ASCORBIC ACID) tablet 125 mg, 125 mg, Oral, Daily, Acheampong, Warnell Bureau, MD, 125 mg at 01/02/21 1001    ALLERGIES   Codeine and Penicillin g     REVIEW OF SYSTEMS    Review of Systems:  Gen:  Denies  fever, sweats, chills weigh loss  HEENT: Denies blurred vision, double vision, ear pain, eye pain, hearing loss, nose bleeds, sore throat Cardiac:  No dizziness, chest pain or heaviness, chest tightness,edema Resp:   Reports SOB at rest  Gi: Denies swallowing difficulty, stomach pain, nausea or vomiting, diarrhea, constipation, bowel incontinence Gu:  Denies bladder incontinence, burning urine Ext:   Denies Joint pain, stiffness or swelling Skin: Denies  skin rash, easy bruising or bleeding or hives Endoc:  Denies polyuria, polydipsia , polyphagia or weight change Psych:   Denies depression, insomnia or hallucinations   Other:  All other systems negative   VS: BP (!) 153/72 (BP Location: Right Arm)   Pulse 69   Temp 97.8 F (36.6 C)   Resp 20   Ht 5\' 5"  (1.651 m)   Wt 90.7 kg   SpO2 93%   BMI 33.28 kg/m      PHYSICAL EXAM    GENERAL:NAD, no fevers, chills, no weakness no fatigue HEAD: Normocephalic, atraumatic.  EYES: Pupils equal, round, reactive to light. Extraocular muscles intact. No scleral icterus.  MOUTH: Moist mucosal membrane. Dentition intact. No abscess noted.  EAR, NOSE, THROAT: Clear without exudates. No external lesions.  NECK: Supple. No thyromegaly. No nodules. No JVD.  PULMONARY:rhonchi bilaterally  CARDIOVASCULAR: S1 and S2. Regular rate  and rhythm. No murmurs, rubs, or gallops. No edema. Pedal pulses 2+ bilaterally.  GASTROINTESTINAL: Soft, nontender, nondistended. No masses. Positive bowel sounds. No hepatosplenomegaly.  MUSCULOSKELETAL: No swelling, clubbing, or edema. Range of motion full in all extremities.  NEUROLOGIC: Cranial nerves II through XII are intact. No gross focal neurological deficits. Sensation intact. Reflexes intact.  SKIN: No ulceration, lesions, rashes, or cyanosis. Skin warm and dry. Turgor intact.  PSYCHIATRIC: Mood, affect within normal limits. The patient is awake, alert and oriented x 3. Insight, judgment intact.       IMAGING    DG Chest 2 View  Result Date: 12/29/2020 CLINICAL DATA:  Shortness of breath EXAM: CHEST - 2 VIEW COMPARISON:  11/09/2020 FINDINGS: Cardiomegaly left chest multi lead pacer. Scattered bilateral heterogeneous airspace opacities, improved compared to prior examination. The visualized skeletal structures are unremarkable. IMPRESSION: 1. Scattered bilateral heterogeneous airspace opacities, improved compared to prior examination, and consistent with improved infection and/or edema. 2.  Cardiomegaly. Electronically Signed   By: Eddie Candle M.D.   On: 01/05/2021 19:25   CT Head Wo Contrast  Result Date: 12/28/2020 CLINICAL DATA:  Mental status changes EXAM: CT HEAD WITHOUT CONTRAST TECHNIQUE: Contiguous axial images were obtained from the base of the skull through the vertex without intravenous contrast. COMPARISON:  09/25/2016 FINDINGS: Brain: There is atrophy and chronic small vessel disease changes. No acute intracranial abnormality. Specifically, no hemorrhage, hydrocephalus, mass lesion, acute infarction, or significant intracranial injury. Vascular: No hyperdense vessel or unexpected calcification. Skull: No acute calvarial abnormality. Sinuses/Orbits: Air-fluid levels in the maxillary sinuses and sphenoid sinuses. Mucosal thickening throughout the ethmoid air cells. Other:  None IMPRESSION: Atrophy, chronic microvascular disease. No acute intracranial abnormality. Acute on chronic pan sinusitis. Electronically Signed   By: Rolm Baptise M.D.   On: 12/17/2020 21:47   ECHOCARDIOGRAM COMPLETE  Result Date: 01/01/2021    ECHOCARDIOGRAM REPORT   Patient Name:   ELAINAH KOSTYK Date of Exam: 01/01/2021 Medical Rec #:  [id number]      Height:       65.0 in Accession #:    [id number]     Weight:  200.0 lb Date of Birth:  07-18-1936      BSA:          1.978 m Patient Age:    37 years       BP:           110/58 mmHg Patient Gender: F              HR:           74 bpm. Exam Location:  [facility] Procedure: 2D Echo, Color Doppler and Cardiac Doppler Indications:     I48.91 Atrial Fibrillation  History:         Patient has no prior history of Echocardiogram examinations.                  CHF and Cardiomyopathy, CAD; Risk Factors:Sleep Apnea,                  Hypertension and Dyslipidemia. Pt tested positive for COVID-19                  on 01/05/2021.  Sonographer:     Charmayne Sheer RDCS (AE) Referring Phys:  [id number] [place] Diagnosing Phys: Bartholome Bill MD  Sonographer Comments: Suboptimal apical window. IMPRESSIONS  1. Left ventricular ejection fraction, by estimation, is 65 to 70%. Left ventricular ejection fraction by PLAX is 71 %. The left ventricle has normal function. The left ventricle has no regional wall motion abnormalities. Left ventricular diastolic parameters were normal.  2. Right ventricular systolic function is normal. The right ventricular size is normal.  3. Left atrial size was mildly dilated.  4. The mitral valve is grossly normal. Moderate mitral valve regurgitation.  5. Tricuspid valve regurgitation is moderate to severe.  6. The aortic valve is grossly normal. Aortic valve regurgitation is trivial. FINDINGS  Left Ventricle: Left ventricular ejection fraction, by estimation, is 65 to 70%. Left ventricular ejection fraction by PLAX is 71 %. The left ventricle has normal  function. The left ventricle has no regional wall motion abnormalities. The left ventricular internal cavity size was normal in size. There is no left ventricular hypertrophy. Left ventricular diastolic parameters were normal. Right Ventricle: The right ventricular size is normal. No increase in right ventricular wall thickness. Right ventricular systolic function is normal. Left Atrium: Left atrial size was mildly dilated. Right Atrium: Right atrial size was normal in size. Pericardium: There is no evidence of pericardial effusion. Mitral Valve: The mitral valve is grossly normal. Moderate mitral valve regurgitation. MV peak gradient, 10.1 mmHg. The mean mitral valve gradient is 5.0 mmHg. Tricuspid Valve: The tricuspid valve is not well visualized. Tricuspid valve regurgitation is moderate to severe. Aortic Valve: The aortic valve is grossly normal. Aortic valve regurgitation is trivial. Aortic valve mean gradient measures 7.0 mmHg. Aortic valve peak gradient measures 13.8 mmHg. Aortic valve area, by VTI measures 1.39 cm. Pulmonic Valve: The pulmonic valve was not well visualized. Pulmonic valve regurgitation is trivial. Aorta: The aortic root is normal in size and structure. IAS/Shunts: No atrial level shunt detected by color flow Doppler.  LEFT VENTRICLE PLAX 2D LV EF:         Left            Diastology                ventricular     LV e' medial:    7.94 cm/s  ejection        LV E/e' medial:  16.8                fraction by     LV e' lateral:   11.90 cm/s                PLAX is 71      LV E/e' lateral: 11.2                %. LVIDd:         4.90 cm LVIDs:         2.90 cm LV PW:         1.20 cm LV IVS:        1.00 cm LVOT diam:     1.70 cm LV SV:         41 LV SV Index:   21 LVOT Area:     2.27 cm  RIGHT VENTRICLE RV Basal diam:  4.40 cm LEFT ATRIUM         Index      RIGHT ATRIUM           Index LA diam:    5.20 cm 2.63 cm/m RA Area:     27.20 cm                                RA Volume:   84.10  ml  42.51 ml/m  AORTIC VALVE                    PULMONIC VALVE AV Area (Vmax):    1.27 cm     PV Vmax:       1.42 m/s AV Area (Vmean):   1.29 cm     PV Vmean:      91.500 cm/s AV Area (VTI):     1.39 cm     PV VTI:        0.280 m AV Vmax:           186.00 cm/s  PV Peak grad:  8.1 mmHg AV Vmean:          120.000 cm/s PV Mean grad:  4.0 mmHg AV VTI:            0.295 m AV Peak Grad:      13.8 mmHg AV Mean Grad:      7.0 mmHg LVOT Vmax:         104.00 cm/s LVOT Vmean:        68.400 cm/s LVOT VTI:          0.181 m LVOT/AV VTI ratio: 0.61  AORTA Ao Root diam: 2.90 cm MITRAL VALVE                TRICUSPID VALVE MV Area (PHT): 2.94 cm     TR Peak grad:   28.1 mmHg MV Area VTI:   1.43 cm     TR Vmax:        265.00 cm/s MV Peak grad:  10.1 mmHg MV Mean grad:  5.0 mmHg     SHUNTS MV Vmax:       1.59 m/s     Systemic VTI:  0.18 m MV Vmean:      107.0 cm/s   Systemic Diam: 1.70 cm MV Decel Time: 258 msec MV E velocity: 133.67 cm/s Bartholome Bill MD Electronically signed by Bartholome Bill MD Signature Date/Time: 01/01/2021/4:36:21  PM    Final       ASSESSMENT/PLAN    Acute hypoxemic respiratory failure due to COVID19 infection -Remdesevir antiviral - pharmacy protocol 5 d -vitamin C -zinc -currently on solumedrol 60 bid -Diuresis - Lasix 40 IV daily - monitor UOP - utilize external urinary catheter if possible -Self prone if patient can tolerate -patient unable to prone but will lay on side -encourage to use IS and Acapella device for bronchopulmonary hygiene -patient does not have these currently -d/c hepatotoxic medications while on remdesevir -supportive care with ICU telemetry monitoring -PT/OT when possible -procalcitonin, CRP and ferritin trending -ABG reviewed - please wean FiO2 -CTPE ordered and in proces -CXR with pulmonary edema worse on left will diurese today TTE with normal systolic and diastolic function but BNP is markedly elevated from baseline suggestive of cardiac strain.  -will continue  to follow along with you       Thank you for allowing me to participate in the care of this patient.   Patient/Family are satisfied with care plan and all questions have been answered.  This document was prepared using Dragon voice recognition software and may include unintentional dictation errors.     Ottie Glazier, M.D.  Division of [facility]

## 2021-01-02 NOTE — Progress Notes (Signed)
Spoke to patients daughter today and gave her an update on patient.

## 2021-01-02 NOTE — Progress Notes (Signed)
Per RN and PT, SNF recommended but patient and daughter hesitant about SNF. Left VM for daughter Dory Larsen requesting a return call for High Risk Screening and to discuss SNF recommendation.   Oleh Genin, [place]

## 2021-01-02 NOTE — Evaluation (Signed)
Physical Therapy Evaluation Patient Details Name: Cynthia Wallace MRN: [id number] DOB: [birth year] Today's Date: 01/02/2021   History of Present Illness  [name] is an 85yo F admitted to [facility] on 12/21/2020 for c/o worsening cough/SOB, generalized weakness, and AMS. Pt updated on vaccinations, but tested (+) for COVID and is currently being treated for COVID PNA. PMH significant for: Afib, SSS, CHF, multiple myeloma. Imaging revealed: scattered bil heterogeneous airspace opacities and cardiomegaly.    Clinical Impression  Pt is a 85 year old F admitted to hospital on 12/28/2020 for COVID PNA. At baseline, pt reports being Ind with all ADL's, simple IADL's, driving, and limited community ambulation without AD. Pt presents with mild confusion, decreased activity tolerance, decreased balance, increased O2 dependence from baseline, hemoptysis, and decreased cardiopulmonary tolerance to activity, resulting in impaired functional mobility. Due to deficits, pt required supervision for bed mobility, min guard for transfers, and min guard for short distance ambulation at bedside with RW. Pt required titration of O2 from 6L-10L during session due to desaturation and impaired recovery; pt required max multimodal cues for pursed lip breathing, as pt sustained breathing through her mouth. Increased time/effort required during evaluation for linen change, self care ADL's, and pericare post BM. Deficits limit the pt's ability to safely and independently perform ADL's, transfer, and ambulate. Pt will benefit from acute skilled PT services to address deficits for return to baseline function. At this time, PT recommends SNF at DC to address deficits and improve overall safety with functional mobility prior to return home.     Follow Up Recommendations SNF;Supervision for mobility/OOB    Equipment Recommendations   (defer to post acute; may need 3in1 and sh/ch if DC home)    Recommendations for Other Services       Precautions  / Restrictions Precautions Precautions: Fall Precaution Comments: O2 >92% Restrictions Weight Bearing Restrictions: No      Mobility  Bed Mobility Overal bed mobility: Needs Assistance Bed Mobility: Rolling;Supine to Sit;Sit to Supine Rolling: Supervision   Supine to sit: Supervision;HOB elevated Sit to supine: Supervision   General bed mobility comments: Supervision for bed mobility with cues for safety; increased time/effort to perform mobility with cues for PLB due to desat    Transfers Overall transfer level: Needs assistance   Transfers: Sit to/from Stand Sit to Stand: Min guard;From elevated surface         General transfer comment: Min guard for STS from EOB with RW; multimodal cues for sequencing/safety.  Ambulation/Gait Ambulation/Gait assistance: Min guard Gait Distance (Feet): 2 Feet Assistive device: Rolling walker (2 wheeled)   Gait velocity: decreased   General Gait Details: Min guard for safety to ambulate short distance at bedside with RW. Pt demonstrated slowed cadence, decreased step length/foot clearance bil, and poor safety awareness. Max multimodal cues for sequencing/safety, and PLB for desaturation.     Balance Overall balance assessment: Needs assistance Sitting-balance support: Bilateral upper extremity supported;Feet supported Sitting balance-Leahy Scale: Fair Sitting balance - Comments: Fair seated balance at EOB with bil UE support   Standing balance support: Bilateral upper extremity supported Standing balance-Leahy Scale: Fair Standing balance comment: Fair standing balance in RW                             Pertinent Vitals/Pain Pain Assessment: No/denies pain    Home Living Family/patient expects to be discharged to:: Private residence Living Arrangements: Alone Available Help at Discharge: Available PRN/intermittently;Friend(s) Type of  Home: House Home Access: Level entry     Home Layout: One level Home  Equipment: [place] - 2 wheels;Cane - quad      Prior Function Level of Independence: Independent         Comments: Pt reports being Ind with all ADL's, simple IADL's, driving, and limited community ambulation without AD.     Hand Dominance        Extremity/Trunk Assessment   Upper Extremity Assessment Upper Extremity Assessment: Overall WFL for tasks assessed (Grossly 4/5, no sensation/coordination deficits)    Lower Extremity Assessment Lower Extremity Assessment: Overall WFL for tasks assessed (Grossly 4+/5 with exception of 3+/5 hip flexion; no sensory/coordination deficits)       Communication   Communication: No difficulties  Cognition Arousal/Alertness: Awake/alert Behavior During Therapy: WFL for tasks assessed/performed Overall Cognitive Status: Within Functional Limits for tasks assessed                                 General Comments: Pt A&O x3, oriented to person, place, situation, and year; required cues for month, reporting "February"      General Comments General comments (skin integrity, edema, etc.): Pt required increased titration of O2 from 6L to 10L by end of session, with pt desat to 82% at lowest; pt able to intermittently recover with max cues for PLB as pt mainly was breathing through mouth. Pt at 91% on 10L upon PT exit.    Exercises Other Exercises Other Exercises: Pt able to participate in bed mobility, transfers, and short distance ambulation with RW, requiring grossly supervision - min guard for safety. Intermittent confusion, requiring multimodal cues for processing of task and safety with mobility. Further mobility limited due to poor cardiopulmonary tolerance to activity. Other Exercises: Pt able to perform multiple bouts of rolling for PT assist for pericare post BM. Other Exercises: Pt educated regarding: PT role/POC, DC recommendations, PLB, safety with mobility.   Assessment/Plan    PT Assessment Patient needs continued  PT services  PT Problem List Decreased mobility;Decreased safety awareness;Decreased knowledge of precautions;Decreased activity tolerance;Cardiopulmonary status limiting activity;Decreased balance       PT Treatment Interventions Gait training;Functional mobility training;Therapeutic activities;Therapeutic exercise;Balance training;Neuromuscular re-education    PT Goals (Current goals can be found in the Care Plan section)  Acute Rehab PT Goals Patient Stated Goal: to get better PT Goal Formulation: With patient Time For Goal Achievement: 01/16/21 Potential to Achieve Goals: Fair    Frequency Min 2X/week   Barriers to discharge Decreased caregiver support         AM-PAC PT "6 Clicks" Mobility  Outcome Measure Help needed turning from your back to your side while in a flat bed without using bedrails?: A Little Help needed moving from lying on your back to sitting on the side of a flat bed without using bedrails?: A Little Help needed moving to and from a bed to a chair (including a wheelchair)?: A Little Help needed standing up from a chair using your arms (e.g., wheelchair or bedside chair)?: A Little Help needed to walk in hospital room?: A Lot Help needed climbing 3-5 steps with a railing? : A Lot 6 Click Score: 16    End of Session Equipment Utilized During Treatment: Gait belt;Oxygen (6-10LPM) Activity Tolerance: Patient limited by fatigue Patient left: in bed;with call bell/phone within reach;with bed alarm set Nurse Communication: Mobility status (vitals) PT Visit Diagnosis: Unsteadiness on feet (R26.81);Muscle  weakness (generalized) (M62.81);Difficulty in walking, not elsewhere classified (R26.2)    Time: 5400-8676 PT Time Calculation (min) (ACUTE ONLY): 44 min   Charges:   PT Evaluation $PT Eval Moderate Complexity: 1 Mod PT Treatments $Therapeutic Activity: 23-37 mins        Herminio Commons, PT, DPT 11:52 AM,01/02/21

## 2021-01-02 NOTE — Progress Notes (Signed)
Patient ID: Cynthia Wallace, female   DOB: [birth year], 85 y.o.   MRN: [id number]  PROGRESS NOTE    Cynthia Wallace  HCW:[id number] DOB: December 30, 1935 DOA: 12/30/2020 PCP: Idelle Crouch, MD   Brief Narrative:  86 year old female with history of IgG myeloma currently on treatment, chronic anemia, chronic diastolic CHF on chronic diuretics, chronic atrial fibrillation on anticoagulation, chronic pulmonary hypertension, CKD stage III, hypertension and hyperlipidemia presented with worsening shortness of breath.  Chest x-ray on presentation showed bilateral dizziness opacities.  She was found to be positive for Covid.  She was started on Decadron, remdesivir, antibiotics and Lasix.  Assessment & Plan:   COVID-19 pneumonia Possible superimposed bacterial pneumonia Acute hypoxic respiratory failure -Respiratory status is worsening.  Patient was on 6 L high flow nasal cannula this morning during rounds. -Switch Decadron to Solu-Medrol 60 mg IV every 12 hours.  Continue remdesivir. -Continue Rocephin and Zithromax because of elevated procalcitonin. -CTA chest. -Remains full code. -Palliative care consultation for goals of care discussion  Acute on chronic diastolic CHF -Echo showed EF of 65 to 70%. -Continue IV Lasix.  Give another dose of Lasix 20 mg now IV.  Strict input and output.  Daily weights.  Fluid restriction.  Continue metoprolol  Chronic atrial fibrillation -Continue Xarelto.  Currently rate controlled.  Elevated troponin -Possible from demand ischemia.  Troponins did not trend up.  Chronic anemia Pancytopenia IgG myeloma - will request oncology evaluation as per daughter's request  Acute metabolic encephalopathy -Probably from hypoxia.  CT on admission did not show any acute intracranial abnormalities   DVT prophylaxis: Xarelto Code Status: Full Family Communication: Daughter on phone on 01/02/2021 Disposition Plan: Status is: Inpatient  Remains inpatient appropriate  because:Inpatient level of care appropriate due to severity of illness   Dispo: The patient is from: Home              Anticipated d/c is to: Home              Anticipated d/c date is: > 3 days              Patient currently is not medically stable to d/c.   Difficult to place patient No   Consultants: None  Procedures: Echo as above  Antimicrobials:  Anti-infectives (From admission, onward)   Start     Dose/Rate Route Frequency Ordered Stop   01/01/21 2300  azithromycin (ZITHROMAX) 500 mg in sodium chloride 0.9 % 250 mL IVPB        500 mg 250 mL/hr over 60 Minutes Intravenous Every 24 hours 01/01/21 2030     01/01/21 2200  cefTRIAXone (ROCEPHIN) 2 g in sodium chloride 0.9 % 100 mL IVPB        2 g 200 mL/hr over 30 Minutes Intravenous Every 24 hours 01/01/21 2030     01/01/21 1000  remdesivir 100 mg in sodium chloride 0.9 % 100 mL IVPB       "Followed by" Linked Group Details   100 mg 200 mL/hr over 30 Minutes Intravenous Daily 01/05/2021 2336 01/05/21 0959   12/25/2020 2339  remdesivir 200 mg in sodium chloride 0.9% 250 mL IVPB       "Followed by" Linked Group Details   200 mg 580 mL/hr over 30 Minutes Intravenous Once 12/19/2020 2336 01/01/21 0231   12/23/2020 2230  cefTRIAXone (ROCEPHIN) 2 g in sodium chloride 0.9 % 100 mL IVPB        2 g 200 mL/hr over 30 Minutes  Intravenous  Once 12/27/2020 2224 01/01/21 0051   12/19/2020 2230  azithromycin (ZITHROMAX) tablet 500 mg        500 mg Oral  Once 12/15/2020 2224 01/01/21 0051       Subjective: Patient seen and examined at bedside.  Complains of worsening shortness of breath and does not feel well.  Poor historian.  No overnight fever or vomiting reported.  Objective: Vitals:   01/01/21 2255 01/02/21 0305 01/02/21 0724 01/02/21 1108  BP: (!) 122/56 128/63 132/69   Pulse: 73 73 70 78  Resp: 20 20 18    Temp: 98.2 F (36.8 C) 97.8 F (36.6 C) 97.6 F (36.4 C)   TempSrc: Oral Oral Oral   SpO2: 93% 91% 94% (!) 89%  Weight:       Height:        Intake/Output Summary (Last 24 hours) at 01/02/2021 1213 Last data filed at 01/02/2021 0408 Gross per 24 hour  Intake 350 ml  Output 150 ml  Net 200 ml   Filed Weights   01/02/2021 1858  Weight: 90.7 kg    Examination:  General exam: Chronically ill looking.  Currently on 6 L oxygen via nasal cannula Respiratory system: Bilateral decreased breath sounds at bases with scattered crackles Cardiovascular system: S1 & S2 heard, Rate controlled Gastrointestinal system: Abdomen is nondistended, soft and nontender. Normal bowel sounds heard. Extremities: No cyanosis, clubbing; trace lower extremity edema Central nervous system: Very slow to respond.  Awake.  Poor historian.  No focal neurological deficits. Moving extremities Skin: No rashes, lesions or ulcers Psychiatry: Flat affect    Data Reviewed: I have personally reviewed following labs and imaging studies  CBC: Recent Labs  Lab 01/02/2021 1902 01/01/21 0115 01/02/21 0602  WBC 6.8 5.4 3.8*  NEUTROABS  --  4.6 3.4  HGB 8.0* 8.1* 8.0*  HCT 25.1* 26.0* 24.2*  MCV 100.8* 102.0* 99.6  PLT 75* 70* 70*   Basic Metabolic Panel: Recent Labs  Lab 12/25/2020 1902 01/01/21 0115 01/02/21 0602  NA 137 137 140  K 3.3* 3.6 3.2*  CL 104 104 109  CO2 21* 22 20*  GLUCOSE 123* 158* 132*  BUN 27* 27* 35*  CREATININE 1.50* 1.51* 1.21*  CALCIUM 7.7* 7.7* 7.5*  MG 1.6*  --   --    GFR: Estimated Creatinine Clearance: 39.9 mL/min (A) (by C-G formula based on SCr of 1.21 mg/dL (H)). Liver Function Tests: Recent Labs  Lab 12/30/2020 1902 01/01/21 0115 01/02/21 0602  AST 26 30 36  ALT 24 23 25   ALKPHOS 66 66 57  BILITOT 3.0* 2.7* 1.3*  PROT 7.8 8.1 7.4  ALBUMIN 3.1* 3.1* 2.8*   No results for input(s): LIPASE, AMYLASE in the last 168 hours. No results for input(s): AMMONIA in the last 168 hours. Coagulation Profile: No results for input(s): INR, PROTIME in the last 168 hours. Cardiac Enzymes: No results for  input(s): CKTOTAL, CKMB, CKMBINDEX, TROPONINI in the last 168 hours. BNP (last 3 results) No results for input(s): PROBNP in the last 8760 hours. HbA1C: No results for input(s): HGBA1C in the last 72 hours. CBG: Recent Labs  Lab 12/25/2020 1850  GLUCAP 100*   Lipid Profile: No results for input(s): CHOL, HDL, LDLCALC, TRIG, CHOLHDL, LDLDIRECT in the last 72 hours. Thyroid Function Tests: No results for input(s): TSH, T4TOTAL, FREET4, T3FREE, THYROIDAB in the last 72 hours. Anemia Panel: No results for input(s): VITAMINB12, FOLATE, FERRITIN, TIBC, IRON, RETICCTPCT in the last 72 hours. Sepsis Labs: Recent Labs  Lab 12/13/2020 1902 01/01/21 0115 01/02/21 0602  PROCALCITON 1.42 1.80 1.36    Recent Results (from the past 240 hour(s))  SARS Coronavirus 2 by RT PCR (hospital order, performed in [facility] hospital lab) Nasopharyngeal Nasopharyngeal Swab     Status: Abnormal   Collection Time: 12/28/2020  9:08 PM   Specimen: Nasopharyngeal Swab  Result Value Ref Range Status   SARS Coronavirus 2 POSITIVE (A) NEGATIVE Final    Comment: RESULT CALLED TO, READ BACK BY AND VERIFIED WITH: NOAH CRIFFINTH AT 2233 ON 12/08/2020 BY SS (NOTE) SARS-CoV-2 target nucleic acids are DETECTED  SARS-CoV-2 RNA is generally detectable in upper respiratory specimens  during the acute phase of infection.  Positive results are indicative  of the presence of the identified virus, but do not rule out bacterial infection or co-infection with other pathogens not detected by the test.  Clinical correlation with patient history and  other diagnostic information is necessary to determine patient infection status.  The expected result is negative.  Fact Sheet for Patients:   StrictlyIdeas.no   Fact Sheet for Healthcare Providers:   BankingDealers.co.za    This test is not yet approved or cleared by the [place] FDA and  has been authorized for detection  and/or diagnosis of SARS-CoV-2 by FDA under an Emergency Use Authorization (EUA).  This EUA will remain in effect (meaning th is test can be used) for the duration of  the COVID-19 declaration under Section 564(b)(1) of the Act, 21 U.S.C. section 360-bbb-3(b)(1), unless the authorization is terminated or revoked sooner.  Performed at [facility], [address], [place], [place] [address]   Urine culture     Status: Abnormal   Collection Time: 12/12/2020  9:08 PM   Specimen: Urine, Random  Result Value Ref Range Status   Specimen Description   Final    URINE, RANDOM Performed at [facility], [address], [place], [place] [address]    Special Requests   Final    NONE Performed at [facility], [address] [place], [place] [address]    Culture (A)  Final    <10,000 COLONIES/mL INSIGNIFICANT GROWTH Performed at [facility], [place] [address], [place], [place] [address]    Report Status 01/02/2021 FINAL  Final  Blood culture (routine x 2)     Status: None (Preliminary result)   Collection Time: 12/30/2020 10:50 PM   Specimen: BLOOD  Result Value Ref Range Status   Specimen Description BLOOD RIGHT ASSIST CONTROL  Final   Special Requests   Final    BOTTLES DRAWN AEROBIC AND ANAEROBIC Blood Culture adequate volume   Culture   Final    NO GROWTH 2 DAYS Performed at [facility], [address], [place], [place] [address]    Report Status PENDING  Incomplete  Blood culture (routine x 2)     Status: None (Preliminary result)   Collection Time: 12/16/2020 10:51 PM   Specimen: BLOOD  Result Value Ref Range Status   Specimen Description BLOOD LEFT ASSIST CONTROL  Final   Special Requests   Final    BOTTLES DRAWN AEROBIC AND ANAEROBIC Blood Culture adequate volume   Culture   Final    NO GROWTH 2 DAYS Performed at [facility], [address], [place], [place] [address]    Report Status PENDING  Incomplete          Radiology Studies: DG Chest 2 View  Result Date: 12/18/2020 CLINICAL DATA:  Shortness of breath EXAM: CHEST - 2 VIEW COMPARISON:  11/09/2020 FINDINGS: Cardiomegaly left chest multi lead pacer. Scattered bilateral heterogeneous airspace opacities, improved compared to prior examination. The visualized skeletal structures are unremarkable. IMPRESSION: 1. Scattered bilateral heterogeneous airspace opacities, improved compared to prior examination, and consistent with improved infection and/or edema. 2.  Cardiomegaly. Electronically Signed   By: Eddie Candle M.D.   On: 01/03/2021 19:25   CT Head Wo Contrast  Result Date: 12/19/2020 CLINICAL DATA:  Mental status changes EXAM: CT HEAD WITHOUT CONTRAST TECHNIQUE: Contiguous axial images were obtained from the base of the skull through the vertex without intravenous contrast. COMPARISON:  09/25/2016 FINDINGS: Brain: There is atrophy and chronic small vessel disease changes. No acute intracranial abnormality. Specifically, no hemorrhage, hydrocephalus, mass lesion, acute infarction, or significant intracranial injury. Vascular: No hyperdense vessel or unexpected calcification. Skull: No acute calvarial abnormality. Sinuses/Orbits: Air-fluid levels in the maxillary sinuses and sphenoid sinuses. Mucosal thickening throughout the ethmoid air cells. Other: None IMPRESSION: Atrophy, chronic microvascular disease. No acute intracranial abnormality. Acute on chronic pan sinusitis. Electronically Signed   By: Rolm Baptise M.D.   On: 12/29/2020 21:47   ECHOCARDIOGRAM COMPLETE  Result Date: 01/01/2021    ECHOCARDIOGRAM REPORT   Patient Name:   Cynthia Wallace Date of Exam: 01/01/2021 Medical Rec #:  [id number]      Height:       65.0 in Accession #:    [id number]     Weight:       200.0 lb Date of Birth:  October 16, 1936      BSA:          1.978 m Patient Age:    34 years       BP:           110/58 mmHg Patient Gender: F              HR:           74 bpm. Exam Location:   [facility] Procedure: 2D Echo, Color Doppler and Cardiac Doppler Indications:     I48.91 Atrial Fibrillation  History:         Patient has no prior history of Echocardiogram examinations.                  CHF and Cardiomyopathy, CAD; Risk Factors:Sleep Apnea,                  Hypertension and Dyslipidemia. Pt tested positive for COVID-19                  on 12/12/2020.  Sonographer:     Charmayne Sheer RDCS (AE) Referring Phys:  [id number] [place] Diagnosing Phys: Bartholome Bill MD  Sonographer Comments: Suboptimal apical window. IMPRESSIONS  1. Left ventricular ejection fraction, by estimation, is 65 to 70%. Left ventricular ejection fraction by PLAX is 71 %. The left ventricle has normal function. The left ventricle has no regional wall motion abnormalities. Left ventricular diastolic parameters were normal.  2. Right ventricular systolic function is normal. The right ventricular size is normal.  3. Left atrial size was mildly dilated.  4. The mitral valve is grossly normal. Moderate mitral valve regurgitation.  5. Tricuspid valve regurgitation is moderate to severe.  6. The aortic valve is grossly normal. Aortic valve regurgitation is trivial. FINDINGS  Left Ventricle: Left ventricular ejection fraction, by estimation, is 65 to 70%. Left ventricular ejection fraction by PLAX is 71 %. The left ventricle has  normal function. The left ventricle has no regional wall motion abnormalities. The left ventricular internal cavity size was normal in size. There is no left ventricular hypertrophy. Left ventricular diastolic parameters were normal. Right Ventricle: The right ventricular size is normal. No increase in right ventricular wall thickness. Right ventricular systolic function is normal. Left Atrium: Left atrial size was mildly dilated. Right Atrium: Right atrial size was normal in size. Pericardium: There is no evidence of pericardial effusion. Mitral Valve: The mitral valve is grossly normal. Moderate mitral valve  regurgitation. MV peak gradient, 10.1 mmHg. The mean mitral valve gradient is 5.0 mmHg. Tricuspid Valve: The tricuspid valve is not well visualized. Tricuspid valve regurgitation is moderate to severe. Aortic Valve: The aortic valve is grossly normal. Aortic valve regurgitation is trivial. Aortic valve mean gradient measures 7.0 mmHg. Aortic valve peak gradient measures 13.8 mmHg. Aortic valve area, by VTI measures 1.39 cm. Pulmonic Valve: The pulmonic valve was not well visualized. Pulmonic valve regurgitation is trivial. Aorta: The aortic root is normal in size and structure. IAS/Shunts: No atrial level shunt detected by color flow Doppler.  LEFT VENTRICLE PLAX 2D LV EF:         Left            Diastology                ventricular     LV e' medial:    7.94 cm/s                ejection        LV E/e' medial:  16.8                fraction by     LV e' lateral:   11.90 cm/s                PLAX is 71      LV E/e' lateral: 11.2                %. LVIDd:         4.90 cm LVIDs:         2.90 cm LV PW:         1.20 cm LV IVS:        1.00 cm LVOT diam:     1.70 cm LV SV:         41 LV SV Index:   21 LVOT Area:     2.27 cm  RIGHT VENTRICLE RV Basal diam:  4.40 cm LEFT ATRIUM         Index      RIGHT ATRIUM           Index LA diam:    5.20 cm 2.63 cm/m RA Area:     27.20 cm                                RA Volume:   84.10 ml  42.51 ml/m  AORTIC VALVE                    PULMONIC VALVE AV Area (Vmax):    1.27 cm     PV Vmax:       1.42 m/s AV Area (Vmean):   1.29 cm     PV Vmean:      91.500 cm/s AV Area (VTI):     1.39 cm     PV VTI:  0.280 m AV Vmax:           186.00 cm/s  PV Peak grad:  8.1 mmHg AV Vmean:          120.000 cm/s PV Mean grad:  4.0 mmHg AV VTI:            0.295 m AV Peak Grad:      13.8 mmHg AV Mean Grad:      7.0 mmHg LVOT Vmax:         104.00 cm/s LVOT Vmean:        68.400 cm/s LVOT VTI:          0.181 m LVOT/AV VTI ratio: 0.61  AORTA Ao Root diam: 2.90 cm MITRAL VALVE                TRICUSPID  VALVE MV Area (PHT): 2.94 cm     TR Peak grad:   28.1 mmHg MV Area VTI:   1.43 cm     TR Vmax:        265.00 cm/s MV Peak grad:  10.1 mmHg MV Mean grad:  5.0 mmHg     SHUNTS MV Vmax:       1.59 m/s     Systemic VTI:  0.18 m MV Vmean:      107.0 cm/s   Systemic Diam: 1.70 cm MV Decel Time: 258 msec MV E velocity: 133.67 cm/s Bartholome Bill MD Electronically signed by Bartholome Bill MD Signature Date/Time: 01/01/2021/4:36:21 PM    Final         Scheduled Meds: . albuterol  1-2 puff Inhalation Q6H  . aspirin EC  81 mg Oral Daily  . atorvastatin  10 mg Oral Daily  . dexamethasone (DECADRON) injection  6 mg Intravenous Q24H  . fluticasone  1 spray Each Nare Daily  . furosemide  20 mg Intravenous Daily  . hydrOXYzine  10 mg Oral TID  . lenalidomide  15 mg Oral Daily  . metoprolol succinate  50 mg Oral BID  . montelukast  10 mg Oral QHS  . multivitamin with minerals  1 tablet Oral Daily  . pantoprazole  40 mg Oral Daily  . potassium chloride  40 mEq Oral Q4H  . rivaroxaban  15 mg Oral Q supper  . traZODone  50 mg Oral QHS  . vitamin C  125 mg Oral Daily   Continuous Infusions: . azithromycin Stopped (01/02/21 0002)  . cefTRIAXone (ROCEPHIN)  IV Stopped (01/01/21 2203)  . remdesivir 100 mg in NS 100 mL 100 mg (01/02/21 1006)          Aline August, MD [facility] 01/02/2021, 12:13 PM

## 2021-01-03 ENCOUNTER — Inpatient Hospital Stay: Payer: PPO

## 2021-01-03 DIAGNOSIS — I509 Heart failure, unspecified: Secondary | ICD-10-CM | POA: Diagnosis not present

## 2021-01-03 DIAGNOSIS — R4182 Altered mental status, unspecified: Secondary | ICD-10-CM

## 2021-01-03 DIAGNOSIS — Z515 Encounter for palliative care: Secondary | ICD-10-CM

## 2021-01-03 DIAGNOSIS — U071 COVID-19: Secondary | ICD-10-CM

## 2021-01-03 DIAGNOSIS — J9601 Acute respiratory failure with hypoxia: Secondary | ICD-10-CM

## 2021-01-03 LAB — COMPREHENSIVE METABOLIC PANEL
ALT: 30 U/L (ref 0–44)
AST: 43 U/L — ABNORMAL HIGH (ref 15–41)
Albumin: 2.8 g/dL — ABNORMAL LOW (ref 3.5–5.0)
Alkaline Phosphatase: 63 U/L (ref 38–126)
Anion gap: 10 (ref 5–15)
BUN: 49 mg/dL — ABNORMAL HIGH (ref 8–23)
CO2: 23 mmol/L (ref 22–32)
Calcium: 7.5 mg/dL — ABNORMAL LOW (ref 8.9–10.3)
Chloride: 109 mmol/L (ref 98–111)
Creatinine, Ser: 1.31 mg/dL — ABNORMAL HIGH (ref 0.44–1.00)
GFR, Estimated: 41 mL/min — ABNORMAL LOW (ref >60)
Glucose, Bld: 130 mg/dL — ABNORMAL HIGH (ref 70–99)
Potassium: 4.3 mmol/L (ref 3.5–5.1)
Sodium: 142 mmol/L (ref 135–145)
Total Bilirubin: 1.1 mg/dL (ref 0.3–1.2)
Total Protein: 7.6 g/dL (ref 6.5–8.1)

## 2021-01-03 LAB — CBC WITH DIFFERENTIAL/PLATELET
Abs Immature Granulocytes: 0.05 10*3/uL (ref 0.00–0.07)
Basophils Absolute: 0 10*3/uL (ref 0.0–0.1)
Basophils Relative: 0 %
Eosinophils Absolute: 0 10*3/uL (ref 0.0–0.5)
Eosinophils Relative: 0 %
HCT: 26.5 % — ABNORMAL LOW (ref 36.0–46.0)
Hemoglobin: 8.3 g/dL — ABNORMAL LOW (ref 12.0–15.0)
Immature Granulocytes: 1 %
Lymphocytes Relative: 8 %
Lymphs Abs: 0.4 10*3/uL — ABNORMAL LOW (ref 0.7–4.0)
MCH: 32.4 pg (ref 26.0–34.0)
MCHC: 31.3 g/dL (ref 30.0–36.0)
MCV: 103.5 fL — ABNORMAL HIGH (ref 80.0–100.0)
Monocytes Absolute: 0.5 10*3/uL (ref 0.1–1.0)
Monocytes Relative: 9 %
Neutro Abs: 4.1 10*3/uL (ref 1.7–7.7)
Neutrophils Relative %: 82 %
Platelets: 104 10*3/uL — ABNORMAL LOW (ref 150–400)
RBC: 2.56 MIL/uL — ABNORMAL LOW (ref 3.87–5.11)
RDW: 17 % — ABNORMAL HIGH (ref 11.5–15.5)
Smear Review: NORMAL
WBC: 5 10*3/uL (ref 4.0–10.5)
nRBC: 0 % (ref 0.0–0.2)

## 2021-01-03 LAB — BLOOD GAS, ARTERIAL
Acid-base deficit: 0.5 mmol/L (ref 0.0–2.0)
Bicarbonate: 26.6 mmol/L (ref 20.0–28.0)
FIO2: 1
MECHVT: 400 mL
O2 Saturation: 96.7 %
PEEP: 10 cmH2O
Patient temperature: 37
RATE: 20 resp/min
pCO2 arterial: 54 mmHg — ABNORMAL HIGH (ref 32.0–48.0)
pH, Arterial: 7.3 — ABNORMAL LOW (ref 7.350–7.450)
pO2, Arterial: 96 mmHg (ref 83.0–108.0)

## 2021-01-03 LAB — FERRITIN: Ferritin: 1219 ng/mL — ABNORMAL HIGH (ref 11–307)

## 2021-01-03 LAB — FIBRIN DERIVATIVES D-DIMER (ARMC ONLY): Fibrin derivatives D-dimer (ARMC): 2511.93 ng/mL (FEU) — ABNORMAL HIGH (ref 0.00–499.00)

## 2021-01-03 LAB — C-REACTIVE PROTEIN: CRP: 15.2 mg/dL — ABNORMAL HIGH (ref <1.0)

## 2021-01-03 MED ORDER — FUROSEMIDE 10 MG/ML IJ SOLN
40.0000 mg | Freq: Two times a day (BID) | INTRAMUSCULAR | Status: DC
  Administered 2021-01-03 – 2021-01-04 (×3): 40 mg via INTRAVENOUS
  Filled 2021-01-03 (×3): qty 4

## 2021-01-03 MED ORDER — SENNOSIDES-DOCUSATE SODIUM 8.6-50 MG PO TABS: 1.0000 | ORAL_TABLET | Freq: Every evening | ORAL | Status: DC | PRN

## 2021-01-03 MED ORDER — POLYETHYLENE GLYCOL 3350 17 G PO PACK
17.0000 g | PACK | Freq: Every day | ORAL | Status: DC
  Administered 2021-01-04 – 2021-01-11 (×6): 17 g
  Filled 2021-01-03 (×5): qty 1

## 2021-01-03 MED ORDER — ASCORBIC ACID 500 MG PO TABS
250.0000 mg | ORAL_TABLET | Freq: Every day | ORAL | Status: DC
  Administered 2021-01-03: 250 mg via ORAL
  Filled 2021-01-03: qty 1

## 2021-01-03 MED ORDER — DOCUSATE SODIUM 50 MG/5ML PO LIQD
100.0000 mg | Freq: Two times a day (BID) | ORAL | Status: DC
  Administered 2021-01-04 – 2021-01-11 (×11): 100 mg
  Filled 2021-01-03 (×11): qty 10

## 2021-01-03 MED ORDER — LENALIDOMIDE 15 MG PO CAPS: 15.0000 mg | ORAL_CAPSULE | Freq: Every day | ORAL | Status: DC

## 2021-01-03 MED ORDER — HYDROXYZINE HCL 10 MG PO TABS
10.0000 mg | ORAL_TABLET | Freq: Three times a day (TID) | ORAL | Status: DC
  Administered 2021-01-03: 10 mg
  Filled 2021-01-03 (×2): qty 1

## 2021-01-03 MED ORDER — ADULT MULTIVITAMIN W/MINERALS CH
1.0000 | ORAL_TABLET | Freq: Every day | ORAL | Status: DC
  Administered 2021-01-04: 1
  Filled 2021-01-03: qty 1

## 2021-01-03 MED ORDER — TRAMADOL HCL 50 MG PO TABS: 50.0000 mg | ORAL_TABLET | Freq: Four times a day (QID) | ORAL | Status: DC | PRN

## 2021-01-03 MED ORDER — FENTANYL CITRATE (PF) 100 MCG/2ML IJ SOLN
25.0000 ug | Freq: Once | INTRAMUSCULAR | Status: AC
  Administered 2021-01-03: 25 ug via INTRAVENOUS
  Filled 2021-01-03: qty 2

## 2021-01-03 MED ORDER — PANTOPRAZOLE SODIUM 40 MG IV SOLR
40.0000 mg | Freq: Every day | INTRAVENOUS | Status: DC
  Administered 2021-01-03: 40 mg via INTRAVENOUS
  Filled 2021-01-03: qty 40

## 2021-01-03 MED ORDER — MIDAZOLAM BOLUS VIA INFUSION
2.0000 mg | Freq: Once | INTRAVENOUS | Status: DC
  Filled 2021-01-03: qty 2

## 2021-01-03 MED ORDER — NOREPINEPHRINE 4 MG/250ML-% IV SOLN
INTRAVENOUS | Status: AC
  Filled 2021-01-03: qty 250

## 2021-01-03 MED ORDER — RIVAROXABAN 15 MG PO TABS
15.0000 mg | ORAL_TABLET | Freq: Every day | ORAL | Status: DC
  Administered 2021-01-03: 15 mg

## 2021-01-03 MED ORDER — DEXMEDETOMIDINE HCL IN NACL 400 MCG/100ML IV SOLN
0.4000 ug/kg/h | INTRAVENOUS | Status: DC
  Administered 2021-01-03: 0.6 ug/kg/h via INTRAVENOUS
  Administered 2021-01-04 (×2): 0.8 ug/kg/h via INTRAVENOUS
  Filled 2021-01-03 (×3): qty 100

## 2021-01-03 MED ORDER — POLYETHYLENE GLYCOL 3350 17 G PO PACK: 17.0000 g | PACK | Freq: Every day | ORAL | Status: DC | PRN

## 2021-01-03 MED ORDER — CHLORHEXIDINE GLUCONATE CLOTH 2 % EX PADS
6.0000 | MEDICATED_PAD | Freq: Every day | CUTANEOUS | Status: DC
  Administered 2021-01-04 – 2021-01-11 (×8): 6 via TOPICAL

## 2021-01-03 MED ORDER — ETOMIDATE 2 MG/ML IV SOLN
24.0000 mg | Freq: Once | INTRAVENOUS | Status: AC
  Administered 2021-01-03: 24 mg via INTRAVENOUS
  Filled 2021-01-03: qty 20

## 2021-01-03 MED ORDER — NOREPINEPHRINE 4 MG/250ML-% IV SOLN
5.0000 ug/min | INTRAVENOUS | Status: DC
  Administered 2021-01-03: 5 ug/min via INTRAVENOUS
  Administered 2021-01-04: 20 ug/min via INTRAVENOUS
  Administered 2021-01-04: 26 ug/min via INTRAVENOUS
  Administered 2021-01-04 (×2): 8 ug/min via INTRAVENOUS
  Administered 2021-01-05: 6 ug/min via INTRAVENOUS
  Administered 2021-01-05: 2 ug/min via INTRAVENOUS
  Filled 2021-01-03 (×8): qty 250

## 2021-01-03 MED ORDER — ASCORBIC ACID 500 MG PO TABS
250.0000 mg | ORAL_TABLET | Freq: Every day | ORAL | Status: DC
  Administered 2021-01-04 – 2021-01-11 (×8): 250 mg
  Filled 2021-01-03 (×8): qty 1

## 2021-01-03 MED ORDER — ACETAMINOPHEN 325 MG PO TABS: 650.0000 mg | ORAL_TABLET | Freq: Four times a day (QID) | ORAL | Status: DC | PRN

## 2021-01-03 MED ORDER — DOCUSATE SODIUM 100 MG PO CAPS: 100.0000 mg | ORAL_CAPSULE | Freq: Two times a day (BID) | ORAL | Status: DC | PRN

## 2021-01-03 MED ORDER — SUCCINYLCHOLINE CHLORIDE 20 MG/ML IJ SOLN
140.0000 mg | Freq: Once | INTRAMUSCULAR | Status: AC
  Administered 2021-01-03: 140 mg via INTRAVENOUS
  Filled 2021-01-03: qty 1

## 2021-01-03 MED ORDER — ORAL CARE MOUTH RINSE
15.0000 mL | OROMUCOSAL | Status: DC
  Administered 2021-01-03 – 2021-01-11 (×74): 15 mL via OROMUCOSAL

## 2021-01-03 MED ORDER — FENTANYL BOLUS VIA INFUSION
25.0000 ug | INTRAVENOUS | Status: DC | PRN
  Filled 2021-01-03: qty 25

## 2021-01-03 MED ORDER — FENTANYL 2500MCG IN NS 250ML (10MCG/ML) PREMIX INFUSION
25.0000 ug/h | INTRAVENOUS | Status: DC
  Administered 2021-01-03 (×2): 25 ug/h via INTRAVENOUS
  Administered 2021-01-04: 11:00:00 175 ug/h via INTRAVENOUS
  Administered 2021-01-05: 150 ug/h via INTRAVENOUS
  Administered 2021-01-05: 125 ug/h via INTRAVENOUS
  Administered 2021-01-06 – 2021-01-07 (×2): 100 ug/h via INTRAVENOUS
  Administered 2021-01-08 – 2021-01-11 (×4): 125 ug/h via INTRAVENOUS
  Filled 2021-01-03 (×11): qty 250

## 2021-01-03 MED ORDER — PROPOFOL 1000 MG/100ML IV EMUL
0.0000 ug/kg/min | INTRAVENOUS | Status: DC
  Administered 2021-01-03 – 2021-01-07 (×2): 10 ug/kg/min via INTRAVENOUS
  Filled 2021-01-03 (×2): qty 100

## 2021-01-03 MED ORDER — CHLORHEXIDINE GLUCONATE 0.12% ORAL RINSE (MEDLINE KIT)
15.0000 mL | Freq: Two times a day (BID) | OROMUCOSAL | Status: DC
  Administered 2021-01-03 – 2021-01-11 (×16): 15 mL via OROMUCOSAL

## 2021-01-03 MED ORDER — MONTELUKAST SODIUM 10 MG PO TABS
10.0000 mg | ORAL_TABLET | Freq: Every day | ORAL | Status: DC
  Administered 2021-01-03 – 2021-01-10 (×8): 10 mg
  Filled 2021-01-03 (×8): qty 1

## 2021-01-03 MED ORDER — MORPHINE SULFATE (PF) 2 MG/ML IV SOLN: 1.0000 mg | Freq: Once | INTRAVENOUS | Status: DC

## 2021-01-03 MED ORDER — INSULIN ASPART 100 UNIT/ML ~~LOC~~ SOLN
0.0000 [IU] | SUBCUTANEOUS | Status: DC
  Administered 2021-01-04 (×2): 3 [IU] via SUBCUTANEOUS
  Administered 2021-01-04 (×2): 2 [IU] via SUBCUTANEOUS
  Administered 2021-01-04 – 2021-01-05 (×4): 3 [IU] via SUBCUTANEOUS
  Administered 2021-01-05 – 2021-01-06 (×6): 2 [IU] via SUBCUTANEOUS
  Administered 2021-01-06: 3 [IU] via SUBCUTANEOUS
  Administered 2021-01-06: 2 [IU] via SUBCUTANEOUS
  Administered 2021-01-06 – 2021-01-07 (×3): 3 [IU] via SUBCUTANEOUS
  Administered 2021-01-07: 5 [IU] via SUBCUTANEOUS
  Administered 2021-01-07 – 2021-01-08 (×7): 3 [IU] via SUBCUTANEOUS
  Administered 2021-01-08: 5 [IU] via SUBCUTANEOUS
  Administered 2021-01-08 (×2): 3 [IU] via SUBCUTANEOUS
  Administered 2021-01-09: 2 [IU] via SUBCUTANEOUS
  Administered 2021-01-09 (×2): 3 [IU] via SUBCUTANEOUS
  Administered 2021-01-09: 2 [IU] via SUBCUTANEOUS
  Administered 2021-01-09 (×2): 3 [IU] via SUBCUTANEOUS
  Administered 2021-01-10 (×2): 2 [IU] via SUBCUTANEOUS
  Administered 2021-01-10 (×2): 3 [IU] via SUBCUTANEOUS
  Administered 2021-01-10: 5 [IU] via SUBCUTANEOUS
  Administered 2021-01-11: 3 [IU] via SUBCUTANEOUS
  Administered 2021-01-11 (×2): 2 [IU] via SUBCUTANEOUS
  Filled 2021-01-03 (×40): qty 1

## 2021-01-03 MED ORDER — SODIUM CHLORIDE 0.9 % IV SOLN
250.0000 mL | INTRAVENOUS | Status: DC
  Administered 2021-01-03: 250 mL via INTRAVENOUS

## 2021-01-03 MED ORDER — ATORVASTATIN CALCIUM 10 MG PO TABS
10.0000 mg | ORAL_TABLET | Freq: Every day | ORAL | Status: DC
  Administered 2021-01-04 – 2021-01-11 (×8): 10 mg
  Filled 2021-01-03 (×8): qty 1

## 2021-01-03 MED ORDER — TRAZODONE HCL 50 MG PO TABS
50.0000 mg | ORAL_TABLET | Freq: Every day | ORAL | Status: DC
  Administered 2021-01-03 – 2021-01-10 (×8): 50 mg
  Filled 2021-01-03 (×9): qty 1

## 2021-01-03 NOTE — Procedures (Signed)
Central Venous Catheter Placement:TRIPLE LUMEN   Procedure: Insertion of Non-tunneled Central Venous Catheter(36556) with US guidance (37106)   Indication(s) Medication administration and Difficult access  CVP monitoring Patient receiving vesicant or irritant drug.; Patient receiving intravenous therapy for longer than 5 days.; Patient has limited or no vascular access.    Consent Risks of the procedure as well as the alternatives and risks of each were explained to the patient and/or caregiver.  Consent for the procedure was obtained and is signed in the bedside chart  Consent:emergent   Anesthesia Topical only with 1% lidocaine    Timeout Verified patient identification, verified procedure, site/side was marked, verified correct patient position, special equipment/implants available, medications/allergies/relevant history reviewed, required imaging and test results available. Patient comfort was obtained.     Sterile Technique Maximal sterile technique including full sterile barrier drape, hand hygiene, sterile gown, sterile gloves, mask, hair covering, sterile ultrasound probe cover (if used).   Hand washing performed prior to starting the procedure.    Procedure Description Area of catheter insertion was cleaned with chlorhexidine and draped in sterile fashion.  With real-time ultrasound guidance a central venous catheter was placed into the right internal jugular vein. Nonpulsatile blood flow and easy flushing noted in all ports.  The catheter was sutured in place and sterile dressing applied.   A triple lumen catheter was placed in RIJ Internal Jugular Vein There was good blood return, catheter caps were placed on lumens, catheter flushed easily, the line was secured and a sterile dressing and BIO-PATCH applied.    Complications/Tolerance None; patient tolerated the procedure well. Chest X-ray is ordered to verify placement     EBL Minimal    Specimen(s) None  Number of Attempts: 1 Complications:none Estimated Blood Loss: none Chest Radiograph indicated and ordered.  Operator: Benny Lennert MD

## 2021-01-03 NOTE — Consult Note (Signed)
[facility]  [phone] [phone]   Name: Cynthia Wallace Date: 01/03/2021 MRN: [id number]  DOB: 1935/12/25  Patient Care Team: Idelle Crouch, MD as PCP - General (Internal Medicine) Lloyd Huger, MD as Consulting Physician (Oncology) Corey Skains, MD as Consulting Physician (Cardiology)    REASON FOR CONSULTATION: Cynthia Wallace is a 85 y.o. female with multiple medical problems including IgG myeloma, chronic anemia, history of CHF, A. fib on chronic anticoagulation, pulmonary hypertension, and CKD stage III.  Patient was admitted to hospital 12/24/2020 with progressive shortness of breath and was found to have Covid pneumonia.  Her hospitalization has been complicated by worsening respiratory failure requiring BiPAP.  Palliative care was consulted up address goals.  SOCIAL HISTORY:     reports that she has never smoked. She has never used smokeless tobacco. She reports that she does not drink alcohol and does not use drugs.  Patient lives at home and was cared for by her daughter.  She also has 2 sons.  ADVANCE DIRECTIVES:  Not on file  CODE STATUS: Full code  PAST MEDICAL HISTORY: Past Medical History:  Diagnosis Date  . Acid reflux `  . Adenomatous colon polyp   . Anemia   . Arthritis   . Asthma   . Atrial fibrillation ([place])   . Bacteremia   . Bilateral leg edema   . Bilateral leg edema   . Bronchitis   . Cardiomyopathy ([place])   . CHF (congestive heart failure) ([place])   . Chickenpox   . Chronic a-fib ([place])   . Chronic atrial fibrillation ([place])   . Chronic pulmonary hypertension ([place])   . Coronary artery disease   . Dyspnea on exertion   . Fibrocystic breast disease   . Hyperlipidemia   . Hypertension   . Iron deficiency anemia   . Localized edema   . MGUS (monoclonal gammopathy of unknown significance)   . Neuropathy   . Osteoporosis   . Pelvic pain   . Pneumonia   . PUD  (peptic ulcer disease)   . Severe tricuspid valve insufficiency   . Sick sinus syndrome ([place])   . Sleep apnea   . Tricuspid valve insufficiency   . Venous insufficiency of both lower extremities     PAST SURGICAL HISTORY:  Past Surgical History:  Procedure Laterality Date  . ABDOMINAL HYSTERECTOMY    . BILATERAL KNEE ARTHROSCOPY    . BREAST EXCISIONAL BIOPSY Right 1990   NEG  . BREAST SURGERY    . CARDIAC CATHETERIZATION    . COLONOSCOPY WITH PROPOFOL N/A 08/27/2016   Procedure: COLONOSCOPY WITH PROPOFOL;  Surgeon: Manya Silvas, MD;  Location: [facility] ENDOSCOPY;  Service: Endoscopy;  Laterality: N/A;  . COLONOSCOPY WITH PROPOFOL N/A 11/21/2020   Procedure: COLONOSCOPY WITH PROPOFOL;  Surgeon: Toledo, Benay Pike, MD;  Location: [facility] ENDOSCOPY;  Service: Gastroenterology;  Laterality: N/A;  . ESOPHAGOGASTRODUODENOSCOPY (EGD) WITH PROPOFOL N/A 11/21/2020   Procedure: ESOPHAGOGASTRODUODENOSCOPY (EGD) WITH PROPOFOL;  Surgeon: Toledo, Benay Pike, MD;  Location: [facility] ENDOSCOPY;  Service: Gastroenterology;  Laterality: N/A;  . INSERT / REPLACE / REMOVE PACEMAKER    . JOINT REPLACEMENT    . PACEMAKER INSERTION    . TUBAL LIGATION      HEMATOLOGY/ONCOLOGY HISTORY:  Oncology History   No history exists.    ALLERGIES:  is allergic to codeine and penicillin g.  MEDICATIONS:  Current Facility-Administered Medications  Medication Dose Route Frequency Provider Last  Rate Last Admin  . acetaminophen (TYLENOL) tablet 650 mg  650 mg Oral Q6H PRN Acheampong, Warnell Bureau, MD      . albuterol (VENTOLIN HFA) 108 (90 Base) MCG/ACT inhaler 1-2 puff  1-2 puff Inhalation Q6H Nolberto Hanlon, MD   2 puff at 01/03/21 1338  . ascorbic acid (VITAMIN C) tablet 250 mg  250 mg Oral Daily Aline August, MD   250 mg at 01/03/21 1338  . aspirin EC tablet 81 mg  81 mg Oral Daily Acheampong, Warnell Bureau, MD   81 mg at 01/03/21 1159  . atorvastatin (LIPITOR) tablet 10 mg  10 mg Oral Daily Acheampong, Warnell Bureau, MD   10 mg at  01/03/21 1159  . azithromycin (ZITHROMAX) 500 mg in sodium chloride 0.9 % 250 mL IVPB  500 mg Intravenous Q24H Nolberto Hanlon, MD 250 mL/hr at 01/02/21 2313 500 mg at 01/02/21 2313  . cefTRIAXone (ROCEPHIN) 2 g in sodium chloride 0.9 % 100 mL IVPB  2 g Intravenous Q24H Nolberto Hanlon, MD 200 mL/hr at 01/02/21 2048 2 g at 01/02/21 2048  . [START ON 01/04/2021] Chlorhexidine Gluconate Cloth 2 % PADS 6 each  6 each Topical Daily Alekh, Kshitiz, MD      . fluticasone (FLONASE) 50 MCG/ACT nasal spray 1 spray  1 spray Each Nare Daily Acheampong, Warnell Bureau, MD   1 spray at 01/02/21 1003  . furosemide (LASIX) injection 40 mg  40 mg Intravenous Q12H Aline August, MD   40 mg at 01/03/21 0828  . guaiFENesin-dextromethorphan (ROBITUSSIN DM) 100-10 MG/5ML syrup 10 mL  10 mL Oral Q4H PRN Acheampong, Warnell Bureau, MD   10 mL at 01/03/21 1607  . hydrOXYzine (ATARAX/VISTARIL) tablet 10 mg  10 mg Oral TID Artist Beach, MD   10 mg at 01/03/21 1159  . lenalidomide (REVLIMID) capsule 15 mg  15 mg Oral QHS Alekh, Kshitiz, MD      . methylPREDNISolone sodium succinate (SOLU-MEDROL) 125 mg/2 mL injection 60 mg  60 mg Intravenous Q12H Alekh, Kshitiz, MD   60 mg at 01/03/21 1337  . metoprolol succinate (TOPROL-XL) 24 hr tablet 50 mg  50 mg Oral BID Acheampong, Warnell Bureau, MD   50 mg at 01/03/21 1159  . montelukast (SINGULAIR) tablet 10 mg  10 mg Oral QHS Artist Beach, MD   10 mg at 01/02/21 2038  . multivitamin with minerals tablet 1 tablet  1 tablet Oral Daily Acheampong, Warnell Bureau, MD   1 tablet at 01/03/21 1159  . pantoprazole (PROTONIX) EC tablet 40 mg  40 mg Oral Daily Acheampong, Warnell Bureau, MD   40 mg at 01/03/21 1159  . remdesivir 100 mg in sodium chloride 0.9 % 100 mL IVPB  100 mg Intravenous Daily Acheampong, Warnell Bureau, MD 200 mL/hr at 01/03/21 1205 100 mg at 01/03/21 1205  . Rivaroxaban (XARELTO) tablet 15 mg  15 mg Oral Q supper Nolberto Hanlon, MD   15 mg at 01/02/21 1708  . senna-docusate (Senokot-S) tablet 1 tablet  1  tablet Oral QHS PRN Acheampong, Warnell Bureau, MD      . traMADol Veatrice [place]) tablet 50 mg  50 mg Oral Q6H PRN Artist Beach, MD   50 mg at 01/02/21 1357  . traZODone (DESYREL) tablet 50 mg  50 mg Oral QHS Acheampong, Warnell Bureau, MD   50 mg at 01/02/21 2039    VITAL SIGNS: BP (!) 149/74   Pulse 72   Temp (!) 97 F (36.1 C) (Axillary)  Resp (!) 36   Ht 5' 5"  (1.651 m)   Wt 198 lb 3.1 oz (89.9 kg)   SpO2 91%   BMI 32.98 kg/m  Filed Weights   12/08/2020 1858 01/03/21 0500  Weight: 200 lb (90.7 kg) 198 lb 3.1 oz (89.9 kg)    Estimated body mass index is 32.98 kg/m as calculated from the following:   Height as of this encounter: 5' 5"  (1.651 m).   Weight as of this encounter: 198 lb 3.1 oz (89.9 kg).  LABS: CBC:    Component Value Date/Time   WBC 5.0 01/03/2021 0532   HGB 8.3 (L) 01/03/2021 0532   HGB 11.9 (L) 03/05/2015 1116   HCT 26.5 (L) 01/03/2021 0532   HCT 36.6 03/05/2015 1116   PLT 104 (L) 01/03/2021 0532   PLT 154 03/05/2015 1116   MCV 103.5 (H) 01/03/2021 0532   MCV 96 03/05/2015 1116   NEUTROABS 4.1 01/03/2021 0532   NEUTROABS 4.5 03/05/2015 1116   LYMPHSABS 0.4 (L) 01/03/2021 0532   LYMPHSABS 1.6 03/05/2015 1116   MONOABS 0.5 01/03/2021 0532   MONOABS 0.6 03/05/2015 1116   EOSABS 0.0 01/03/2021 0532   EOSABS 0.1 03/05/2015 1116   BASOSABS 0.0 01/03/2021 0532   BASOSABS 0.0 03/05/2015 1116   Comprehensive Metabolic Panel:    Component Value Date/Time   NA 142 01/03/2021 0532   NA 139 03/05/2015 1116   K 4.3 01/03/2021 0532   K 4.1 03/05/2015 1116   CL 109 01/03/2021 0532   CL 106 03/05/2015 1116   CO2 23 01/03/2021 0532   CO2 28 03/05/2015 1116   BUN 49 (H) 01/03/2021 0532   BUN 23 (H) 03/05/2015 1116   CREATININE 1.31 (H) 01/03/2021 0532   CREATININE 0.75 03/05/2015 1116   GLUCOSE 130 (H) 01/03/2021 0532   GLUCOSE 92 03/05/2015 1116   CALCIUM 7.5 (L) 01/03/2021 0532   CALCIUM 8.9 03/05/2015 1116   AST 43 (H) 01/03/2021 0532   ALT 30 01/03/2021 0532    ALKPHOS 63 01/03/2021 0532   BILITOT 1.1 01/03/2021 0532   PROT 7.6 01/03/2021 0532   ALBUMIN 2.8 (L) 01/03/2021 0532    RADIOGRAPHIC STUDIES: DG Chest 2 View  Result Date: 12/22/2020 CLINICAL DATA:  Shortness of breath EXAM: CHEST - 2 VIEW COMPARISON:  11/09/2020 FINDINGS: Cardiomegaly left chest multi lead pacer. Scattered bilateral heterogeneous airspace opacities, improved compared to prior examination. The visualized skeletal structures are unremarkable. IMPRESSION: 1. Scattered bilateral heterogeneous airspace opacities, improved compared to prior examination, and consistent with improved infection and/or edema. 2.  Cardiomegaly. Electronically Signed   By: Eddie Candle M.D.   On: 01/03/2021 19:25   CT Head Wo Contrast  Result Date: 12/18/2020 CLINICAL DATA:  Mental status changes EXAM: CT HEAD WITHOUT CONTRAST TECHNIQUE: Contiguous axial images were obtained from the base of the skull through the vertex without intravenous contrast. COMPARISON:  09/25/2016 FINDINGS: Brain: There is atrophy and chronic small vessel disease changes. No acute intracranial abnormality. Specifically, no hemorrhage, hydrocephalus, mass lesion, acute infarction, or significant intracranial injury. Vascular: No hyperdense vessel or unexpected calcification. Skull: No acute calvarial abnormality. Sinuses/Orbits: Air-fluid levels in the maxillary sinuses and sphenoid sinuses. Mucosal thickening throughout the ethmoid air cells. Other: None IMPRESSION: Atrophy, chronic microvascular disease. No acute intracranial abnormality. Acute on chronic pan sinusitis. Electronically Signed   By: Rolm Baptise M.D.   On: 12/30/2020 21:47   CT ANGIO CHEST PE W OR WO CONTRAST  Result Date: 01/02/2021 CLINICAL DATA:  COVID-19  positive, pneumonia, worsening hypoxia EXAM: CT ANGIOGRAPHY CHEST WITH CONTRAST TECHNIQUE: Multidetector CT imaging of the chest was performed using the standard protocol during bolus administration of  intravenous contrast. Multiplanar CT image reconstructions and MIPs were obtained to evaluate the vascular anatomy. CONTRAST:  57m OMNIPAQUE IOHEXOL 350 MG/ML SOLN COMPARISON:  12/12/2020, 11/24/2017 FINDINGS: Cardiovascular: This is a technically adequate evaluation of the pulmonary vasculature. No filling defects or pulmonary emboli. The heart is enlarged, with significant biatrial dilatation right greater than left. Dual lead pacemaker is noted, lead within the right atrium and right ventricle. No pericardial effusion. No evidence of thoracic aortic aneurysm or dissection. Mild atherosclerosis of the aorta. Severe coronary artery atherosclerosis unchanged. Mediastinum/Nodes: No enlarged mediastinal, hilar, or axillary lymph nodes. Thyroid gland, trachea, and esophagus demonstrate no significant findings. Lungs/Pleura: There is significant multifocal bilateral airspace disease, which has progressed since the 01/02/2021 x-ray. The most dense consolidation is seen within the lung bases. There are trace bilateral pleural effusions. No pneumothorax. The central airways are patent. Upper Abdomen: Significant reflux of contrast into the hepatic veins suggests cardiac dysfunction. No acute upper abdominal process. Musculoskeletal: No acute or destructive bony lesions. Reconstructed images demonstrate no additional findings. Review of the MIP images confirms the above findings. IMPRESSION: 1. No evidence of pulmonary embolus. 2. Progressive widespread bilateral airspace disease, with associated cardiomegaly and trace bilateral pleural effusions. Pattern of findings suggest congestive heart failure. Superimposed infection cannot be excluded. 3. Aortic Atherosclerosis (ICD10-I70.0). Coronary artery atherosclerosis. Electronically Signed   By: MRanda NgoM.D.   On: 01/02/2021 17:08   ECHOCARDIOGRAM COMPLETE  Result Date: 01/01/2021    ECHOCARDIOGRAM REPORT   Patient Name:   Kaidance JRYLAN KAUFMANNDate of Exam: 01/01/2021  Medical Rec #:  [id number]     Height:       65.0 in Accession #:    [id number]    Weight:       200.0 lb Date of Birth:  4May 24, 1937     BSA:          1.978 m Patient Age:    89years       BP:           110/58 mmHg Patient Gender: F              HR:           74 bpm. Exam Location:  [facility] Procedure: 2D Echo, Color Doppler and Cardiac Doppler Indications:     I48.91 Atrial Fibrillation  History:         Patient has no prior history of Echocardiogram examinations.                  CHF and Cardiomyopathy, CAD; Risk Factors:Sleep Apnea,                  Hypertension and Dyslipidemia. Pt tested positive for COVID-19                  on 12/30/2020.  Sonographer:     JCharmayne SheerRDCS (AE) Referring Phys:  [id number] BeachDiagnosing Phys: KBartholome BillMD  Sonographer Comments: Suboptimal apical window. IMPRESSIONS  1. Left ventricular ejection fraction, by estimation, is 65 to 70%. Left ventricular ejection fraction by PLAX is 71 %. The left ventricle has normal function. The left ventricle has no regional wall motion abnormalities. Left ventricular diastolic parameters were normal.  2. Right ventricular systolic function is normal. The right ventricular size  is normal.  3. Left atrial size was mildly dilated.  4. The mitral valve is grossly normal. Moderate mitral valve regurgitation.  5. Tricuspid valve regurgitation is moderate to severe.  6. The aortic valve is grossly normal. Aortic valve regurgitation is trivial. FINDINGS  Left Ventricle: Left ventricular ejection fraction, by estimation, is 65 to 70%. Left ventricular ejection fraction by PLAX is 71 %. The left ventricle has normal function. The left ventricle has no regional wall motion abnormalities. The left ventricular internal cavity size was normal in size. There is no left ventricular hypertrophy. Left ventricular diastolic parameters were normal. Right Ventricle: The right ventricular size is normal. No increase in right ventricular wall thickness.  Right ventricular systolic function is normal. Left Atrium: Left atrial size was mildly dilated. Right Atrium: Right atrial size was normal in size. Pericardium: There is no evidence of pericardial effusion. Mitral Valve: The mitral valve is grossly normal. Moderate mitral valve regurgitation. MV peak gradient, 10.1 mmHg. The mean mitral valve gradient is 5.0 mmHg. Tricuspid Valve: The tricuspid valve is not well visualized. Tricuspid valve regurgitation is moderate to severe. Aortic Valve: The aortic valve is grossly normal. Aortic valve regurgitation is trivial. Aortic valve mean gradient measures 7.0 mmHg. Aortic valve peak gradient measures 13.8 mmHg. Aortic valve area, by VTI measures 1.39 cm. Pulmonic Valve: The pulmonic valve was not well visualized. Pulmonic valve regurgitation is trivial. Aorta: The aortic root is normal in size and structure. IAS/Shunts: No atrial level shunt detected by color flow Doppler.  LEFT VENTRICLE PLAX 2D LV EF:         Left            Diastology                ventricular     LV e' medial:    7.94 cm/s                ejection        LV E/e' medial:  16.8                fraction by     LV e' lateral:   11.90 cm/s                PLAX is 71      LV E/e' lateral: 11.2                %. LVIDd:         4.90 cm LVIDs:         2.90 cm LV PW:         1.20 cm LV IVS:        1.00 cm LVOT diam:     1.70 cm LV SV:         41 LV SV Index:   21 LVOT Area:     2.27 cm  RIGHT VENTRICLE RV Basal diam:  4.40 cm LEFT ATRIUM         Index      RIGHT ATRIUM           Index LA diam:    5.20 cm 2.63 cm/m RA Area:     27.20 cm                                RA Volume:   84.10 ml  42.51 ml/m  AORTIC VALVE  PULMONIC VALVE AV Area (Vmax):    1.27 cm     PV Vmax:       1.42 m/s AV Area (Vmean):   1.29 cm     PV Vmean:      91.500 cm/s AV Area (VTI):     1.39 cm     PV VTI:        0.280 m AV Vmax:           186.00 cm/s  PV Peak grad:  8.1 mmHg AV Vmean:          120.000 cm/s PV Mean  grad:  4.0 mmHg AV VTI:            0.295 m AV Peak Grad:      13.8 mmHg AV Mean Grad:      7.0 mmHg LVOT Vmax:         104.00 cm/s LVOT Vmean:        68.400 cm/s LVOT VTI:          0.181 m LVOT/AV VTI ratio: 0.61  AORTA Ao Root diam: 2.90 cm MITRAL VALVE                TRICUSPID VALVE MV Area (PHT): 2.94 cm     TR Peak grad:   28.1 mmHg MV Area VTI:   1.43 cm     TR Vmax:        265.00 cm/s MV Peak grad:  10.1 mmHg MV Mean grad:  5.0 mmHg     SHUNTS MV Vmax:       1.59 m/s     Systemic VTI:  0.18 m MV Vmean:      107.0 cm/s   Systemic Diam: 1.70 cm MV Decel Time: 258 msec MV E velocity: 133.67 cm/s Bartholome Bill MD Electronically signed by Bartholome Bill MD Signature Date/Time: 01/01/2021/4:36:21 PM    Final     PERFORMANCE STATUS (ECOG) : 3 - Symptomatic, >50% confined to bed  Review of Systems Unable to complete  Physical Exam General: Ill-appearing Cardiovascular: regular rate and rhythm Pulmonary: Unlabored on BiPAP Abdomen: soft, nontender, + bowel sounds GU: no suprapubic tenderness Extremities: no edema, no joint deformities Skin: no rashes Neurological: Weakness, confused  IMPRESSION: Patient seen in the ICU.  She is currently on BiPAP and was unable to engage meaningfully in conversation regarding goals.  I met with patient's daughter who was at bedside.  Daughter seems to have a reasonable understanding of patient's current medical problems and prognosis.  Patient progressed from high flow nasal cannula to BiPAP after rapid response was called on 1/27 due to worsening respiratory failure.  Daughter understands that BiPAP is a bridge to improvement or ventilator.  Daughter feels like patient would want to remain a full code for now and she would be interested in intubating patient for a few days if needed.  She is considering whether she would want CPR or defibrillation given an understanding that care might ultimately prove traumatic and futile.  Daughter is in agreement with current  scope of treatment.  Again, she would like to give patient time to see if she improves.  In the event the patient were to decline or become evident that care were futile, it seems that daughter would be open to a conversation regarding alternative measures for care.  Prior to this hospitalization, patient was living at home but was receiving increased care from family.  Daughter recognizes that patient is unlikely return to her previous  baseline and has already thought about pursuing placement in the event the patient survives this illness.  PLAN: -Continue current scope of care -Full code -Will follow  Case and plan discussed with Dr. Grayland Ormond  Time Total: 60 minutes  Visit consisted of counseling and education dealing with the complex and emotionally intense issues of symptom management and palliative care in the setting of serious and potentially life-threatening illness.Greater than 50%  of this time was spent counseling and coordinating care related to the above assessment and plan.  Signed by: Altha Harm, PhD, NP-C

## 2021-01-03 NOTE — Procedures (Addendum)
Endotracheal Intubation: Patient required placement of an artificial airway secondary to Respiratory Failure  Consent: Emergent.   Hand washing performed prior to starting the procedure.   Medications administered for sedation prior to procedure:  Fentanyl 52mg IV,  Succinylcholine 140 mg IV, Etomidate 224mIV.    A time out procedure was called and correct patient, name, & ID confirmed. Needed supplies and equipment were assembled and checked to include ETT, 10 ml syringe, Glidescope, Mac and Miller blades, suction, oxygen and bag mask valve, end tidal CO2 monitor.   Patient was positioned to align the mouth and pharynx to facilitate visualization of the glottis.   Heart rate, SpO2 and blood pressure was continuously monitored during the procedure. Pre-oxygenation was conducted prior to intubation and endotracheal tube was placed through the vocal cords into the trachea.     The artificial airway was placed under direct visualization via glidescope route using a 7-5 ETT on the first attempt.  ETT was secured at 23 cm mark.  Placement was confirmed by auscuitation of lungs with good breath sounds bilaterally and no stomach sounds.  Condensation was noted on endotracheal tube.   Pulse ox 98%.  CO2 detector in place with appropriate color change.   Complications: None .   Operator: Kirstie Larsen   Chest radiograph ordered and pending.   Comments: OGT placed via glidescope.  JaArcelia JewD

## 2021-01-03 NOTE — H&P (Signed)
Name: Cynthia Wallace MRN: [id number] DOB: 1936/08/21     CONSULTATION DATE: 12/19/2020  REFERRING MD :  Starla Link  CHIEF COMPLAINT:  Hypoxic respiratory failure  HISTORY OF PRESENT ILLNESS:  Per admission H and P: "Cynthia Wallace is an 85 y.o. female with multiple medical comorbidities that include IgG myeloma, chronic anemia, chronic systolic CHF(unknwn LVEF) on chronic diuretics, atrial fibrillation on chronic anticoagulation, chronic pulmonary hypertension,CKD stage III, PPM, hypertension and hyperlipidemia. As per patient, over the past several months, she has had progressive worsening shortness of breath from her baseline, however over the past few days, she has felt extremely weak and tired with minimal exertion. She has had poor oral intake and poor appetite over the past few days. She is up-to-date with her vaccinations. Family decided to bring patient into the emergency room to be further evaluated after some concerns about altered mental status from her baseline. No reported falls at home. Denies any chest pain or palpitations. Denies fever or chills. On presentation, patient was noted to be positive for Covid. Due to patient being a poor historian and difficult to assess duration of her symptoms. Chest x-ray was concerning for bilateral heterogeneous space opacities."  Hospital Course: CT-PA on 01/02/2021 showed no evidence of PE. Patient treated for COVID 19 pneumonia and superimposed bacterial pneumonia. Was started on 6L HFNC on 01/02/2021 and then transitioned to 50L HFNC this morning. Transferred to ICU as stepdown and started on BPAP. Patient continued to desaturate with any movement. Had to bump up optiflow to 100%. Patient remains full code. Had extensive discussion with patient and daughter. Patient understands that prognosis likely remains poor. But she would like to do a trial of intubation and mechanical ventilation. Intubated the evening of 01/03/2021.   PAST MEDICAL HISTORY :   has  a past medical history of Acid reflux (`), Adenomatous colon polyp, Anemia, Arthritis, Asthma, Atrial fibrillation ([place]), Bacteremia, Bilateral leg edema, Bilateral leg edema, Bronchitis, Cardiomyopathy ([place]), CHF (congestive heart failure) ([place]), Chickenpox, Chronic a-fib (HCC), Chronic atrial fibrillation ([place]), Chronic pulmonary hypertension ([place]), Coronary artery disease, Dyspnea on exertion, Fibrocystic breast disease, Hyperlipidemia, Hypertension, Iron deficiency anemia, Localized edema, MGUS (monoclonal gammopathy of unknown significance), Neuropathy, Osteoporosis, Pelvic pain, Pneumonia, PUD (peptic ulcer disease), Severe tricuspid valve insufficiency, Sick sinus syndrome ([place]), Sleep apnea, Tricuspid valve insufficiency, and Venous insufficiency of both lower extremities.  has a past surgical history that includes Abdominal hysterectomy; Joint replacement; Pacemaker insertion; Breast excisional biopsy (Right, 1990); Tubal ligation; Insert / replace / remove pacemaker; Bilateral knee arthroscopy; Cardiac catheterization; Colonoscopy with propofol (N/A, 08/27/2016); Breast surgery; Colonoscopy with propofol (N/A, 11/21/2020); and Esophagogastroduodenoscopy (egd) with propofol (N/A, 11/21/2020). Prior to Admission medications   Medication Sig Start Date End Date Taking? Authorizing Provider  albuterol (PROVENTIL) (2.5 MG/3ML) 0.083% nebulizer solution Inhale into the lungs every 4 (four) hours as needed. 03/13/16  Yes [provider]  ascorbic acid (VITAMIN C) 100 MG tablet Take 100 mg by mouth daily.   Yes [provider]  atorvastatin (LIPITOR) 10 MG tablet Take 10 mg by mouth daily.   Yes [provider]  B Complex Vitamins (VITAMIN B COMPLEX PO) Take 1 tablet by mouth daily.   Yes [provider]  Cholecalciferol 25 MCG (1000 UT) tablet Take 1,000 Units by mouth daily.   Yes [provider]  dexamethasone (DECADRON) 4 MG tablet Take 5 tablets (20 mg total)  by mouth once a week. Take with food. 11/15/20  Yes Lloyd Huger,  MD  digoxin (LANOXIN) 0.125 MG tablet Take 0.125 mg by mouth daily.    Yes [provider]  esomeprazole (NEXIUM) 10 MG packet Take 10 mg by mouth daily before breakfast.   Yes [provider]  fluticasone (FLONASE) 50 MCG/ACT nasal spray SHAKE LIQUID AND USE 2 SPRAYS IN EACH NOSTRIL EVERY DAY AS NEEDED FOR RHINITIS OR ALLERGIES 02/01/18  Yes [provider]  furosemide (LASIX) 20 MG tablet Take 40 mg by mouth daily. Pt to take 2 tablets once a day    Yes [provider]  hydrOXYzine (ATARAX/VISTARIL) 10 MG tablet Take 1 tablet by mouth 3 (three) times daily. 03/31/17  Yes [provider]  lenalidomide (REVLIMID) 15 MG capsule Take 1 capsule (15 mg total) by mouth daily. for 21 days with then 7 days off. 12/26/20  Yes Finnegan, Kathlene November, MD  levocetirizine (XYZAL) 5 MG tablet Take 5 mg by mouth daily. 05/14/20  Yes [provider]  losartan (COZAAR) 50 MG tablet Take 50 mg by mouth 2 (two) times daily.   Yes [provider]  Melatonin 10 MG TABS Take 1 tablet by mouth at bedtime as needed. Takes at night   Yes [provider]  metoprolol succinate (TOPROL-XL) 50 MG 24 hr tablet Take 50 mg by mouth 2 (two) times daily.   Yes [provider]  montelukast (SINGULAIR) 10 MG tablet Take 10 mg by mouth at bedtime.   Yes [provider]  pantoprazole (PROTONIX) 40 MG tablet Take 40 mg by mouth daily. 06/15/18 01/01/21 Yes [provider]  potassium chloride (K-DUR) 10 MEQ tablet Take 10 mEq by mouth 3 (three) times daily.    Yes [provider]  rivaroxaban (XARELTO) 20 MG TABS tablet Take 20 mg by mouth daily with supper.   Yes [provider]  traMADol (ULTRAM) 50 MG tablet Take 50 mg by mouth 4 (four) times daily as needed. 03/27/20  Yes [provider]  traZODone (DESYREL) 50 MG tablet Take 50 mg by mouth at bedtime.    Yes [provider]  vitamin B-12 (CYANOCOBALAMIN) 1000 MCG tablet Take by mouth.   Yes [provider]   Allergies  Allergen Reactions  . Codeine Nausea Only  . Penicillin G Other (See Comments)    As a child    FAMILY HISTORY:  family history includes Breast cancer in her sister; Cancer in her sister; Heart disease in her father, mother, and sister; Hyperlipidemia in her mother and sister; Hypertension in her mother; Stroke in her sister. She was adopted. SOCIAL HISTORY:  reports that she has never smoked. She has never used smokeless tobacco. She reports that she does not drink alcohol and does not use drugs.  REVIEW OF SYSTEMS:   Unable to obtain due to critical illness    Estimated body mass index is 32.98 kg/m as calculated from the following:   Height as of this encounter: 5' 5"  (1.651 m).   Weight as of this encounter: 89.9 kg.  VITAL SIGNS: Temp:  [97 F (36.1 C)-98.6 F (37 C)] 97 F (36.1 C) (01/27 1200) Pulse Rate:  [69-90] 72 (01/27 1500) Resp:  [11-36] 36 (01/27 1500) BP: (136-175)/(66-107) 149/74 (01/27 1500) SpO2:  [86 %-95 %] 92 % (01/27 1723) FiO2 (%):  [100 %] 100 % (01/27 1723) Weight:  [89.9 kg] 89.9 kg (01/27 0500)   I/O last 3 completed shifts: In: 350 [IV Piggyback:350] Out: 2845 [Urine:2845] No intake/output data recorded.   SpO2:  92 % O2 Flow Rate (L/min): 0 L/min FiO2 (%): 100 %   Physical Examination:  GENERAL:critically ill appearing, +resp distress HEAD: Normocephalic, atraumatic.  EYES: Pupils equal, round, reactive to light.  No scleral icterus.  MOUTH: Moist mucosal membrane. NECK: Supple. No JVD.  PULMONARY: +rhonchi, +wheezing, coarse BS on both sides CARDIOVASCULAR: S1 and S2. Regular rate and rhythm. No murmurs, rubs, or gallops.  GASTROINTESTINAL: Soft, nontender, -distended.  Positive bowel sounds.  MUSCULOSKELETAL: No swelling, clubbing, or edema.  NEUROLOGIC: obtunded SKIN:intact,warm,dry  I  personally reviewed lab work that was obtained in last 24 hrs. CXR Independently reviewed-  MEDICATIONS: I have reviewed all medications and confirmed regimen as documented   CULTURE RESULTS   Recent Results (from the past 240 hour(s))  SARS Coronavirus 2 by RT PCR (hospital order, performed in [facility] hospital lab) Nasopharyngeal Nasopharyngeal Swab     Status: Abnormal   Collection Time: 12/08/2020  9:08 PM   Specimen: Nasopharyngeal Swab  Result Value Ref Range Status   SARS Coronavirus 2 POSITIVE (A) NEGATIVE Final    Comment: RESULT CALLED TO, READ BACK BY AND VERIFIED WITH: NOAH CRIFFINTH AT 2233 ON 01/07/2021 BY SS (NOTE) SARS-CoV-2 target nucleic acids are DETECTED  SARS-CoV-2 RNA is generally detectable in upper respiratory specimens  during the acute phase of infection.  Positive results are indicative  of the presence of the identified virus, but do not rule out bacterial infection or co-infection with other pathogens not detected by the test.  Clinical correlation with patient history and  other diagnostic information is necessary to determine patient infection status.  The expected result is negative.  Fact Sheet for Patients:   StrictlyIdeas.no   Fact Sheet for Healthcare Providers:   BankingDealers.co.za    This test is not yet approved or cleared by the [place] FDA and  has been authorized for detection and/or diagnosis of SARS-CoV-2 by FDA under an Emergency Use Authorization (EUA).  This EUA will remain in effect (meaning th is test can be used) for the duration of  the COVID-19 declaration under Section 564(b)(1) of the Act, 21 U.S.C. section 360-bbb-3(b)(1), unless the authorization is terminated or revoked sooner.  Performed at [facility], [address] [place], [place] [address]   Urine culture     Status: Abnormal   Collection Time: 12/24/2020  9:08 PM   Specimen: Urine, Random  Result  Value Ref Range Status   Specimen Description   Final    URINE, RANDOM Performed at [facility], [address], [place], [place] [address]    Special Requests   Final    NONE Performed at [facility], [address] [place], [place] [address]    Culture (A)  Final    <10,000 COLONIES/mL INSIGNIFICANT GROWTH Performed at [facility], [place] [address] [place], [place] [address]    Report Status 01/02/2021 FINAL  Final  Blood culture (routine x 2)     Status: None (Preliminary result)   Collection Time: 12/13/2020 10:50 PM   Specimen: BLOOD  Result Value Ref Range Status   Specimen Description BLOOD RIGHT ASSIST CONTROL  Final   Special Requests   Final    BOTTLES DRAWN AEROBIC AND ANAEROBIC Blood Culture adequate volume   Culture   Final    NO GROWTH 3 DAYS Performed at [facility], [address] [place], [place] [address]    Report Status PENDING  Incomplete  Blood culture (routine x 2)  Status: None (Preliminary result)   Collection Time: 12/23/2020 10:51 PM   Specimen: BLOOD  Result Value Ref Range Status   Specimen Description BLOOD LEFT ASSIST CONTROL  Final   Special Requests   Final    BOTTLES DRAWN AEROBIC AND ANAEROBIC Blood Culture adequate volume   Culture   Final    NO GROWTH 3 DAYS Performed at [facility], [address] [place], [place] [address]    Report Status PENDING  Incomplete          IMAGING    No results found.   Nutrition Status:        Indwelling Urinary Catheter continued, requirement due to   Reason to continue Indwelling Urinary Catheter strict Intake/Output monitoring for hemodynamic instability   Central Line/ continued, requirement due to  Reason to continue [place] of central venous pressure or other hemodynamic parameters and poor IV access   Ventilator continued, requirement due to severe respiratory failure   Ventilator Sedation RASS 0 to -2    ASSESSMENT AND PLAN SYNOPSIS  Severe ACUTE Hypoxic and Hypercapnic Respiratory Failure -continue Full MV support -continue Bronchodilator Therapy -Wean Fio2 and PEEP as tolerated -will perform SAT/SBT when respiratory parameters are met -VAP/VENT bundle implementation -Continue remdesivir and solumedrol for COVID pneumonia -Not a candidate for baricitinib given ongoing care and chemotherapy for myeloma -Palliative following along since admission to hospitalist team  ACUTE KIDNEY INJURY/Renal Failure -Cr currently 1.31  -likely background CKD -continue Foley Catheter-assess need -Avoid nephrotoxic agents -Follow urine output, BMP -Ensure adequate renal perfusion, optimize oxygenation -Renal dose medications   NEUROLOGY -intubated and sedated -minimal sedation to achieve a RASS goal: -1 Wake up assessment pending  Acute toxic metabolic encephalopathy, need for sedation Goal RASS -2 to -3  SHOCK-SEPSIS/HYPOVOLUMIC/CARDIOGENIC -use vasopressors to keep MAP>65 -follow ABG and LA -follow up cultures  CARDIAC ICU monitoring History of PPM placement -monitor on continuous telemetry  ACUTE on CHRONIC DIASTOLIC CARDIAC FAILURE- EF -oxygen as needed -Lasix as tolerated -Echo showed EF of 65-70% -Fluid restriction  Chronic Afib -continue to monitor on continuous telemetry -currently rate controlled -continue xarelto  HEME Chronic anemia IgG Myeloma Pancytopenia -currently undergoing chemotherapy -appreciate inpatient eval and recs  ID -continue rocephin and azithromycin for presumed superimposed CAP given elevated procalcitonin -follow up cultures  GI GI PROPHYLAXIS as indicated  NUTRITIONAL STATUS DIET-->TF's as tolerated Constipation protocol as indicated  ENDO - will use ICU hypoglycemic\Hyperglycemia protocol if needed  ELECTROLYTES -follow labs as needed -replace as needed -pharmacy consultation and following  DVT/GI PRX ordered and  assessed TRANSFUSIONS AS NEEDED MONITOR FSBS I Assessed the need for Labs I Assessed the need for Foley I Assessed the need for Central Venous Line Family Discussion when available I Assessed the need for Mobilization I made an Assessment of medications to be adjusted accordingly Safety Risk assessment Completed  CASE DISCUSSED IN MULTIDISCIPLINARY ROUNDS WITH ICU TEAM   Critical Care Time devoted to patient care services described in this note is 110 minutes.   Overall, patient is critically ill, prognosis is guarded.  Patient with Multiorgan failure and at high risk for cardiac arrest and death.    Arcelia Jew MD Intensivist

## 2021-01-03 NOTE — TOC Initial Note (Signed)
Transition of Care [facility]) - Initial/Assessment Note    Patient Details  Name: Cynthia Wallace MRN: [id number] Date of Birth: 11/16/1936  Transition of Care [facility]) CM/SW Contact:    Magnus Ivan, LCSW Phone Number: 01/03/2021, 8:47 AM  Clinical Narrative:                CSW spoke with patient's daughter. She reported patient lives alone and daughter lives in [place] near [place]. PCP is Dr. Doy Hutching. Pharmacy is [facility]. Patient has a RW and CPAP at home. No SNF history. Patient typically drives herself to appointments or a friend can take her if needed. Patient has had both COVID vaccines. Discussed SNF recommendation. Patient's daughter reported she and patient have discussed this and are agreeable to SNF. She reported they would prefer patient go to a SNF in [place] closer to daughter if possible. CSW explained for insurance to cover, it must be an in network SNF and since patient was COVID positive on 1/24, we will have to try to find a COVID SNF. She verbalized understanding and is agreeable to other SNFs if none in [place] are available.    Expected Discharge Plan: Skilled Nursing Facility Barriers to Discharge: Continued Medical Work up   Patient Goals and CMS Choice Patient states their goals for this hospitalization and ongoing recovery are:: SNF rehab CMS Medicare.gov Compare Post Acute Care list provided to:: Patient Represenative (must comment) Choice offered to / list presented to : Adult Children  Expected Discharge Plan and Services Expected Discharge Plan: [place]       Living arrangements for the past 2 months: Single Family Home                                      Prior Living Arrangements/Services Living arrangements for the past 2 months: Single Family Home Lives with:: Self Patient language and need for interpreter reviewed:: Yes Do you feel safe going back to the place where you live?: Yes      Need for Family Participation in  Patient Care: Yes (Comment) Care giver support system in place?: Yes (comment) Current home services: DME Criminal Activity/Legal Involvement Pertinent to Current Situation/Hospitalization: No - Comment as needed  Activities of Daily Living Home Assistive Devices/Equipment: None ADL Screening (condition at time of admission) Patient's cognitive ability adequate to safely complete daily activities?: Yes Is the patient deaf or have difficulty hearing?: Yes (minimal) Does the patient have difficulty seeing, even when wearing glasses/contacts?: No Does the patient have difficulty concentrating, remembering, or making decisions?: No Patient able to express need for assistance with ADLs?: Yes Does the patient have difficulty dressing or bathing?: No Independently performs ADLs?: Yes (appropriate for developmental age) Does the patient have difficulty walking or climbing stairs?: No Weakness of Legs: Both (minimal) Weakness of Arms/Hands: None  Permission Sought/Granted Permission sought to share information with : Chartered certified accountant granted to share information with : Yes, Verbal Permission Granted (by daughter)     Permission granted to share info w AGENCY: SNFs        Emotional Assessment         Alcohol / Substance Use: Not Applicable Psych Involvement: No (comment)  Admission diagnosis:  Acute respiratory failure with hypoxia ([place]) [J96.01] Acute on chronic heart failure, unspecified heart failure type ([place]) [I50.9] Community acquired pneumonia, unspecified laterality [J18.9] Pneumonia due to COVID-19 virus [U07.1,  J12.82] COVID-19 [U07.1] Patient Active Problem List   Diagnosis Date Noted  . Pneumonia due to COVID-19 virus 2021/01/07  . Combined hyperlipidemia 05/15/2020  . Pure hypercholesterolemia 05/15/2020  . Sick sinus syndrome ([place]) 03/10/2017  . Chronic pulmonary hypertension ([place]) 10/08/2016  . MGUS (monoclonal gammopathy of unknown  significance) 09/17/2016  . Thrombocytopenia ([place]) 09/14/2016  . Anemia, unspecified 08/12/2016  . HTN (hypertension) 04/21/2016  . Pneumonia 04/21/2016  . Enlarged heart 04/21/2016  . Severe tricuspid valve insufficiency 03/19/2016  . Hypoxia 03/07/2016  . Left lower lobe pneumonia 03/07/2016  . Dyspnea on exertion 03/07/2016  . Neuropathy 04/24/2015  . Bronchitis 04/10/2015  . Bacteremia 04/02/2015  . Cardiomyopathy ([place]) 04/01/2015  . Paroxysmal atrial fibrillation ([place]) 04/01/2015  . Pelvic pain 04/01/2015  . Localized edema 03/21/2015  . Chronic a-fib ([place]) 02/26/2015  . OSA on CPAP 09/13/2014   PCP:  Idelle Crouch, MD Pharmacy:   [facility] [phone] [address], [place] [address] Phone: [phone] Fax: [phone]  [facility], [place] - [address] [place] [address] Phone: [phone] Fax: [phone]     Social Determinants of Health ([place]) Interventions    Readmission Risk Interventions Readmission Risk Prevention Plan 01/03/2021  Transportation Screening Complete  PCP or Specialist Appt within 3-5 Days Complete  HRI or [place] Complete  Social Work Consult for [place] Planning/Counseling Complete  Palliative Care Screening Not Applicable  Medication Review Press photographer) Complete  Some recent data might be hidden

## 2021-01-03 NOTE — Significant Event (Signed)
Rapid Response Event Note   Reason for Call :  Hypoxia and increased work of breathing  Initial Focused Assessment:  Rapid response RN arrived in patient's room with 1C staff, RT, and daughter at bedside. Patient with persistent hypoxia on heated high flow nasal cannula and increased work of breathing. Patient was alert but could only speak in very brief sentences. Lungs with rhnochi and coarse crackles. IV lasix was just administered by 1C staff and patient got CT chest yesterday which was negative for PE.  Interventions:  Orders to transfer to stepdown level of care and try bipap from MD at bedside.  Plan of Care:  Transfer to ICU 16 as stepdown patient. Rapid response RN returned to ICU to help prepare ICU 16 for patient's arrival.   Event Summary:   MD Notified: Dr. Starla Link Call Time: 08:26 Arrival Time: 08:29 End Time: 08:45  Lavonne Cass, Jaynie Bream, RN

## 2021-01-03 NOTE — Progress Notes (Signed)
PT Cancellation Note  Patient Details Name: Cynthia Wallace MRN: [id number] DOB: 08-10-1936   Cancelled Treatment:    Reason Eval/Treat Not Completed: Medical issues which prohibited therapy. Per chart review RR called today and pt transitioned to higher level of care (ICU). No continue with transfer orders placed, therefore, PT to sign off at this time. Please re-consult for acute PT services as medically appropriate.  Herminio Commons, PT, DPT 1:20 PM,01/03/21

## 2021-01-03 NOTE — Progress Notes (Signed)
Patient intubated with a #7.5 oral ET tube by MD, with the glide scope on first attemp. Color change noted on capnometer, Bilateral breath sounds auscultated by MD, and condensation noted int ET tube. ET tube secured @ 23 cm at the lip with a commercial tube holder.

## 2021-01-03 NOTE — Progress Notes (Signed)
Pt was transported to CCU from 1C while on the Bipap.

## 2021-01-03 NOTE — Progress Notes (Signed)
Patient ID: Cynthia Wallace, female   DOB: June 25, 1936, 85 y.o.   MRN: [id number]  PROGRESS NOTE    Cynthia Wallace  [id number]:[id number] DOB: May 20, 1936 DOA: 01-13-2021 PCP: Idelle Crouch, MD   Brief Narrative:  85 year old female with history of IgG myeloma currently on treatment, chronic anemia, chronic diastolic CHF on chronic diuretics, chronic atrial fibrillation on anticoagulation, chronic pulmonary hypertension, CKD stage III, hypertension and hyperlipidemia presented with worsening shortness of breath.  Chest x-ray on presentation showed bilateral dizziness opacities.  She was found to be positive for Covid.  She was started on Decadron, remdesivir, antibiotics and Lasix.  Assessment & Plan:   COVID-19 pneumonia Possible superimposed bacterial pneumonia Acute hypoxic respiratory failure -Respiratory status is worsening from 6 L high flow nasal cannula in the morning of 01/02/2021 to 50L high flow nasal cannula this morning.  Patient is still struggling to breathe.  Will try heated high flow and if that does not work, will try BiPAP.  Transfer patient to stepdown unit. -Pulmonary was consulted on 01/02/2021 -Continue remdesivir and Solu-Medrol.  Cannot give baricitinib because of patient being on chemotherapy for myeloma -Continue Rocephin and Zithromax because of elevated procalcitonin. -CTA chest on 01/02/2021 showed no evidence of pulmonary embolism; progressive widespread bilateral airspace disease and trace bilateral pleural effusions. -Remains full code. -I detailed discussion with daughter on phone on 01/02/2021 explaining the overall poor prognosis and again this morning at bedside on 01/03/2021 but for now she wanted to continue current plan of treatment.   -Palliative care consultation for goals of care discussion  Acute on chronic diastolic CHF -Echo showed EF of 65 to 70%. -Strict input and output.  Daily weights.  Fluid restriction.  Continue metoprolol -Increase Lasix to 40 mg IV  every 12 hours.  Chronic atrial fibrillation -Continue Xarelto.  Currently rate controlled.  Elevated troponin -Possible from demand ischemia.  Troponins did not trend up.  Chronic anemia Pancytopenia IgG myeloma - oncology evaluation has been requested as per daughter's request  Acute metabolic encephalopathy -Probably from hypoxia.  CT on admission did not show any acute intracranial abnormalities   DVT prophylaxis: Xarelto Code Status: Full Family Communication: Daughter at bedside on 01/03/2021 Disposition Plan: Status is: Inpatient  Remains inpatient appropriate because:Inpatient level of care appropriate due to severity of illness   Dispo: The patient is from: Home              Anticipated d/c is to: Home              Anticipated d/c date is: > 3 days              Patient currently is not medically stable to d/c.   Difficult to place patient No   Consultants: Oncology/palliative care/pulmonary  Procedures: Echo as above  Antimicrobials:  Anti-infectives (From admission, onward)   Start     Dose/Rate Route Frequency Ordered Stop   01/01/21 2300  azithromycin (ZITHROMAX) 500 mg in sodium chloride 0.9 % 250 mL IVPB        500 mg 250 mL/hr over 60 Minutes Intravenous Every 24 hours 01/01/21 2030     01/01/21 2200  cefTRIAXone (ROCEPHIN) 2 g in sodium chloride 0.9 % 100 mL IVPB        2 g 200 mL/hr over 30 Minutes Intravenous Every 24 hours 01/01/21 2030     01/01/21 1000  remdesivir 100 mg in sodium chloride 0.9 % 100 mL IVPB       "  Followed by" Linked Group Details   100 mg 200 mL/hr over 30 Minutes Intravenous Daily 2021/01/26 2336 01/05/21 0959   2021/01/26 2339  remdesivir 200 mg in sodium chloride 0.9% 250 mL IVPB       "Followed by" Linked Group Details   200 mg 580 mL/hr over 30 Minutes Intravenous Once January 26, 2021 2336 01/01/21 0231   01/26/21 2230  cefTRIAXone (ROCEPHIN) 2 g in sodium chloride 0.9 % 100 mL IVPB        2 g 200 mL/hr over 30 Minutes  Intravenous  Once 26-Jan-2021 2224 01/01/21 0051   2021/01/26 2230  azithromycin (ZITHROMAX) tablet 500 mg        500 mg Oral  Once January 26, 2021 2224 01/01/21 0051       Subjective: Patient seen and examined at bedside.  Poor historian.  Feels anxious and extremely short of breath with even minimal exertion.  No overnight fever, vomiting, chest pain reported.  Objective: Vitals:   01/02/21 2357 01/02/21 2358 01/03/21 0500 01/03/21 0606  BP:  (!) 160/81  140/66  Pulse:  76  69  Resp:  20  20  Temp:  98.6 F (37 C)  97.6 F (36.4 C)  TempSrc:      SpO2: (!) 87% (!) 87%  (!) 86%  Weight:   89.9 kg   Height:        Intake/Output Summary (Last 24 hours) at 01/03/2021 0727 Last data filed at 01/03/2021 9509 Gross per 24 hour  Intake -  Output 2695 ml  Net -2695 ml   Filed Weights   01-26-21 1858 01/03/21 0500  Weight: 90.7 kg 89.9 kg    Examination:  General exam: Chronically ill looking.  Currently on 50 L high flow nasal cannula  respiratory system: Decreased breath sounds at bases bilaterally with crackles Cardiovascular system: Rate controlled, S1-S2 heard Gastrointestinal system: Abdomen is nondistended, soft and nontender.  Bowel sounds are heard  extremities: Bilateral lower extremity mild edema present; no clubbing  Central nervous system: Awake, slow to respond.  Poor historian.  No focal neurological deficits.  Moves extremities  skin: No obvious ecchymosis/lesions Psychiatry: Affect is flat    Data Reviewed: I have personally reviewed following labs and imaging studies  CBC: Recent Labs  Lab 2021-01-26 1902 01/01/21 0115 01/02/21 0602 01/03/21 0532  WBC 6.8 5.4 3.8* 5.0  NEUTROABS  --  4.6 3.4 PENDING  HGB 8.0* 8.1* 8.0* 8.3*  HCT 25.1* 26.0* 24.2* 26.5*  MCV 100.8* 102.0* 99.6 103.5*  PLT 75* 70* 70* 326*   Basic Metabolic Panel: Recent Labs  Lab January 26, 2021 1902 01/01/21 0115 01/02/21 0602 01/03/21 0532  NA 137 137 140 142  K 3.3* 3.6 3.2* 4.3  CL 104  104 109 109  CO2 21* 22 20* 23  GLUCOSE 123* 158* 132* 130*  BUN 27* 27* 35* 49*  CREATININE 1.50* 1.51* 1.21* 1.31*  CALCIUM 7.7* 7.7* 7.5* 7.5*  MG 1.6*  --   --   --    GFR: Estimated Creatinine Clearance: 36.7 mL/min (A) (by C-G formula based on SCr of 1.31 mg/dL (H)). Liver Function Tests: Recent Labs  Lab 01/26/2021 1902 01/01/21 0115 01/02/21 0602 01/03/21 0532  AST 26 30 36 43*  ALT 24 23 25 30   ALKPHOS 66 66 57 63  BILITOT 3.0* 2.7* 1.3* 1.1  PROT 7.8 8.1 7.4 7.6  ALBUMIN 3.1* 3.1* 2.8* 2.8*   No results for input(s): LIPASE, AMYLASE in the last 168 hours. No results for input(s):  AMMONIA in the last 168 hours. Coagulation Profile: No results for input(s): INR, PROTIME in the last 168 hours. Cardiac Enzymes: No results for input(s): CKTOTAL, CKMB, CKMBINDEX, TROPONINI in the last 168 hours. BNP (last 3 results) No results for input(s): PROBNP in the last 8760 hours. HbA1C: No results for input(s): HGBA1C in the last 72 hours. CBG: Recent Labs  Lab 01/01/2021 1850  GLUCAP 100*   Lipid Profile: No results for input(s): CHOL, HDL, LDLCALC, TRIG, CHOLHDL, LDLDIRECT in the last 72 hours. Thyroid Function Tests: No results for input(s): TSH, T4TOTAL, FREET4, T3FREE, THYROIDAB in the last 72 hours. Anemia Panel: Recent Labs    01/03/21 0532  FERRITIN 1,219*   Sepsis Labs: Recent Labs  Lab 01/06/2021 1902 01/01/21 0115 01/02/21 0602  PROCALCITON 1.42 1.80 1.36    Recent Results (from the past 240 hour(s))  SARS Coronavirus 2 by RT PCR (hospital order, performed in [facility] hospital lab) Nasopharyngeal Nasopharyngeal Swab     Status: Abnormal   Collection Time: 01/07/2021  9:08 PM   Specimen: Nasopharyngeal Swab  Result Value Ref Range Status   SARS Coronavirus 2 POSITIVE (A) NEGATIVE Final    Comment: RESULT CALLED TO, READ BACK BY AND VERIFIED WITH: NOAH CRIFFINTH AT 2233 ON 12/17/2020 BY SS (NOTE) SARS-CoV-2 target nucleic acids are  DETECTED  SARS-CoV-2 RNA is generally detectable in upper respiratory specimens  during the acute phase of infection.  Positive results are indicative  of the presence of the identified virus, but do not rule out bacterial infection or co-infection with other pathogens not detected by the test.  Clinical correlation with patient history and  other diagnostic information is necessary to determine patient infection status.  The expected result is negative.  Fact Sheet for Patients:   StrictlyIdeas.no   Fact Sheet for Healthcare Providers:   BankingDealers.co.za    This test is not yet approved or cleared by the [place] FDA and  has been authorized for detection and/or diagnosis of SARS-CoV-2 by FDA under an Emergency Use Authorization (EUA).  This EUA will remain in effect (meaning th is test can be used) for the duration of  the COVID-19 declaration under Section 564(b)(1) of the Act, 21 U.S.C. section 360-bbb-3(b)(1), unless the authorization is terminated or revoked sooner.  Performed at [facility], [address] [place], [place] [address]   Urine culture     Status: Abnormal   Collection Time: 12/27/2020  9:08 PM   Specimen: Urine, Random  Result Value Ref Range Status   Specimen Description   Final    URINE, RANDOM Performed at [facility], [address] [place], [place] [address]    Special Requests   Final    NONE Performed at [facility], [address] [place], [place] [address]    Culture (A)  Final    <10,000 COLONIES/mL INSIGNIFICANT GROWTH Performed at [facility], [place] [address] [place], [place] [address]    Report Status 01/02/2021 FINAL  Final  Blood culture (routine x 2)     Status: None (Preliminary result)   Collection Time: 01/01/2021 10:50 PM   Specimen: BLOOD  Result Value Ref Range Status   Specimen Description BLOOD RIGHT ASSIST CONTROL  Final   Special  Requests   Final    BOTTLES DRAWN AEROBIC AND ANAEROBIC Blood Culture adequate volume   Culture   Final    NO GROWTH 3 DAYS Performed at [facility], [address],  [place], [place] [address]    Report Status PENDING  Incomplete  Blood culture (routine x 2)     Status: None (Preliminary result)   Collection Time: 12/19/2020 10:51 PM   Specimen: BLOOD  Result Value Ref Range Status   Specimen Description BLOOD LEFT ASSIST CONTROL  Final   Special Requests   Final    BOTTLES DRAWN AEROBIC AND ANAEROBIC Blood Culture adequate volume   Culture   Final    NO GROWTH 3 DAYS Performed at [facility], [address], [place], [place] [address]    Report Status PENDING  Incomplete         Radiology Studies: CT ANGIO CHEST PE W OR WO CONTRAST  Result Date: 01/02/2021 CLINICAL DATA:  COVID-19 positive, pneumonia, worsening hypoxia EXAM: CT ANGIOGRAPHY CHEST WITH CONTRAST TECHNIQUE: Multidetector CT imaging of the chest was performed using the standard protocol during bolus administration of intravenous contrast. Multiplanar CT image reconstructions and MIPs were obtained to evaluate the vascular anatomy. CONTRAST:  74mL OMNIPAQUE IOHEXOL 350 MG/ML SOLN COMPARISON:  12/08/2020, 11/24/2017 FINDINGS: Cardiovascular: This is a technically adequate evaluation of the pulmonary vasculature. No filling defects or pulmonary emboli. The heart is enlarged, with significant biatrial dilatation right greater than left. Dual lead pacemaker is noted, lead within the right atrium and right ventricle. No pericardial effusion. No evidence of thoracic aortic aneurysm or dissection. Mild atherosclerosis of the aorta. Severe coronary artery atherosclerosis unchanged. Mediastinum/Nodes: No enlarged mediastinal, hilar, or axillary lymph nodes. Thyroid gland, trachea, and esophagus demonstrate no significant findings. Lungs/Pleura: There is significant multifocal bilateral airspace disease, which has  progressed since the 12/13/2020 x-ray. The most dense consolidation is seen within the lung bases. There are trace bilateral pleural effusions. No pneumothorax. The central airways are patent. Upper Abdomen: Significant reflux of contrast into the hepatic veins suggests cardiac dysfunction. No acute upper abdominal process. Musculoskeletal: No acute or destructive bony lesions. Reconstructed images demonstrate no additional findings. Review of the MIP images confirms the above findings. IMPRESSION: 1. No evidence of pulmonary embolus. 2. Progressive widespread bilateral airspace disease, with associated cardiomegaly and trace bilateral pleural effusions. Pattern of findings suggest congestive heart failure. Superimposed infection cannot be excluded. 3. Aortic Atherosclerosis (ICD10-I70.0). Coronary artery atherosclerosis. Electronically Signed   By: Randa Ngo M.D.   On: 01/02/2021 17:08   ECHOCARDIOGRAM COMPLETE  Result Date: 01/01/2021    ECHOCARDIOGRAM REPORT   Patient Name:   Cynthia Wallace Date of Exam: 01/01/2021 Medical Rec #:  [id number]      Height:       65.0 in Accession #:    [id number]     Weight:       200.0 lb Date of Birth:  01/06/1936      BSA:          1.978 m Patient Age:    51 years       BP:           110/58 mmHg Patient Gender: F              HR:           74 bpm. Exam Location:  [facility] Procedure: 2D Echo, Color Doppler and Cardiac Doppler Indications:     I48.91 Atrial Fibrillation  History:         Patient has no prior history of Echocardiogram examinations.                  CHF and Cardiomyopathy, CAD; Risk  Factors:Sleep Apnea,                  Hypertension and Dyslipidemia. Pt tested positive for COVID-19                  on 12/30/2020.  Sonographer:     Charmayne Sheer RDCS (AE) Referring Phys:  [id number] [place] Diagnosing Phys: Bartholome Bill MD  Sonographer Comments: Suboptimal apical window. IMPRESSIONS  1. Left ventricular ejection fraction, by estimation, is 65 to 70%. Left  ventricular ejection fraction by PLAX is 71 %. The left ventricle has normal function. The left ventricle has no regional wall motion abnormalities. Left ventricular diastolic parameters were normal.  2. Right ventricular systolic function is normal. The right ventricular size is normal.  3. Left atrial size was mildly dilated.  4. The mitral valve is grossly normal. Moderate mitral valve regurgitation.  5. Tricuspid valve regurgitation is moderate to severe.  6. The aortic valve is grossly normal. Aortic valve regurgitation is trivial. FINDINGS  Left Ventricle: Left ventricular ejection fraction, by estimation, is 65 to 70%. Left ventricular ejection fraction by PLAX is 71 %. The left ventricle has normal function. The left ventricle has no regional wall motion abnormalities. The left ventricular internal cavity size was normal in size. There is no left ventricular hypertrophy. Left ventricular diastolic parameters were normal. Right Ventricle: The right ventricular size is normal. No increase in right ventricular wall thickness. Right ventricular systolic function is normal. Left Atrium: Left atrial size was mildly dilated. Right Atrium: Right atrial size was normal in size. Pericardium: There is no evidence of pericardial effusion. Mitral Valve: The mitral valve is grossly normal. Moderate mitral valve regurgitation. MV peak gradient, 10.1 mmHg. The mean mitral valve gradient is 5.0 mmHg. Tricuspid Valve: The tricuspid valve is not well visualized. Tricuspid valve regurgitation is moderate to severe. Aortic Valve: The aortic valve is grossly normal. Aortic valve regurgitation is trivial. Aortic valve mean gradient measures 7.0 mmHg. Aortic valve peak gradient measures 13.8 mmHg. Aortic valve area, by VTI measures 1.39 cm. Pulmonic Valve: The pulmonic valve was not well visualized. Pulmonic valve regurgitation is trivial. Aorta: The aortic root is normal in size and structure. IAS/Shunts: No atrial level shunt  detected by color flow Doppler.  LEFT VENTRICLE PLAX 2D LV EF:         Left            Diastology                ventricular     LV e' medial:    7.94 cm/s                ejection        LV E/e' medial:  16.8                fraction by     LV e' lateral:   11.90 cm/s                PLAX is 71      LV E/e' lateral: 11.2                %. LVIDd:         4.90 cm LVIDs:         2.90 cm LV PW:         1.20 cm LV IVS:        1.00 cm LVOT diam:     1.70 cm LV  SV:         41 LV SV Index:   21 LVOT Area:     2.27 cm  RIGHT VENTRICLE RV Basal diam:  4.40 cm LEFT ATRIUM         Index      RIGHT ATRIUM           Index LA diam:    5.20 cm 2.63 cm/m RA Area:     27.20 cm                                RA Volume:   84.10 ml  42.51 ml/m  AORTIC VALVE                    PULMONIC VALVE AV Area (Vmax):    1.27 cm     PV Vmax:       1.42 m/s AV Area (Vmean):   1.29 cm     PV Vmean:      91.500 cm/s AV Area (VTI):     1.39 cm     PV VTI:        0.280 m AV Vmax:           186.00 cm/s  PV Peak grad:  8.1 mmHg AV Vmean:          120.000 cm/s PV Mean grad:  4.0 mmHg AV VTI:            0.295 m AV Peak Grad:      13.8 mmHg AV Mean Grad:      7.0 mmHg LVOT Vmax:         104.00 cm/s LVOT Vmean:        68.400 cm/s LVOT VTI:          0.181 m LVOT/AV VTI ratio: 0.61  AORTA Ao Root diam: 2.90 cm MITRAL VALVE                TRICUSPID VALVE MV Area (PHT): 2.94 cm     TR Peak grad:   28.1 mmHg MV Area VTI:   1.43 cm     TR Vmax:        265.00 cm/s MV Peak grad:  10.1 mmHg MV Mean grad:  5.0 mmHg     SHUNTS MV Vmax:       1.59 m/s     Systemic VTI:  0.18 m MV Vmean:      107.0 cm/s   Systemic Diam: 1.70 cm MV Decel Time: 258 msec MV E velocity: 133.67 cm/s Harold HedgeKenneth Fath MD Electronically signed by Harold HedgeKenneth Fath MD Signature Date/Time: 01/01/2021/4:36:21 PM    Final         Scheduled Meds: . albuterol  1-2 puff Inhalation Q6H  . aspirin EC  81 mg Oral Daily  . atorvastatin  10 mg Oral Daily  . fluticasone  1 spray Each Nare Daily  .  furosemide  40 mg Intravenous Daily  . hydrOXYzine  10 mg Oral TID  . lenalidomide  15 mg Oral Daily  . methylPREDNISolone (SOLU-MEDROL) injection  60 mg Intravenous Q12H  . metoprolol succinate  50 mg Oral BID  . montelukast  10 mg Oral QHS  . multivitamin with minerals  1 tablet Oral Daily  . pantoprazole  40 mg Oral Daily  . rivaroxaban  15 mg Oral Q supper  . traZODone  50 mg Oral QHS  . vitamin C  125 mg Oral Daily   Continuous Infusions: . azithromycin 500 mg (01/02/21 2313)  . cefTRIAXone (ROCEPHIN)  IV 2 g (01/02/21 2048)  . remdesivir 100 mg in NS 100 mL Stopped (01/02/21 1026)          Aline August, MD [facility] 01/03/2021, 7:27 AM

## 2021-01-03 NOTE — Progress Notes (Signed)
Patient on HFNC 15L and non-rebreather 15L with oxygen sats of 78-83%. Paged MD, new orders for Progressive Care and heated high flow. Called rapid on patient. Respiratory placed patient on heated high flow and patients oxygen sats were still 88-90%. Decision was made to move patient to step down ICU and to be placed on bi-pap. Transferred patient to ICU on bi-pap with respiratory present and report was given to ICU nurse.

## 2021-01-04 DIAGNOSIS — Z515 Encounter for palliative care: Secondary | ICD-10-CM | POA: Diagnosis not present

## 2021-01-04 DIAGNOSIS — R4182 Altered mental status, unspecified: Secondary | ICD-10-CM | POA: Diagnosis not present

## 2021-01-04 DIAGNOSIS — U071 COVID-19: Secondary | ICD-10-CM | POA: Diagnosis not present

## 2021-01-04 DIAGNOSIS — J9601 Acute respiratory failure with hypoxia: Secondary | ICD-10-CM | POA: Diagnosis not present

## 2021-01-04 LAB — CBC WITH DIFFERENTIAL/PLATELET
Abs Immature Granulocytes: 0.11 10*3/uL — ABNORMAL HIGH (ref 0.00–0.07)
Basophils Absolute: 0 10*3/uL (ref 0.0–0.1)
Basophils Relative: 0 %
Eosinophils Absolute: 0 10*3/uL (ref 0.0–0.5)
Eosinophils Relative: 0 %
HCT: 27.6 % — ABNORMAL LOW (ref 36.0–46.0)
Hemoglobin: 8.2 g/dL — ABNORMAL LOW (ref 12.0–15.0)
Immature Granulocytes: 2 %
Lymphocytes Relative: 8 %
Lymphs Abs: 0.6 10*3/uL — ABNORMAL LOW (ref 0.7–4.0)
MCH: 31.5 pg (ref 26.0–34.0)
MCHC: 29.7 g/dL — ABNORMAL LOW (ref 30.0–36.0)
MCV: 106.2 fL — ABNORMAL HIGH (ref 80.0–100.0)
Monocytes Absolute: 0.6 10*3/uL (ref 0.1–1.0)
Monocytes Relative: 8 %
Neutro Abs: 6.3 10*3/uL (ref 1.7–7.7)
Neutrophils Relative %: 82 %
Platelets: 181 10*3/uL (ref 150–400)
RBC: 2.6 MIL/uL — ABNORMAL LOW (ref 3.87–5.11)
RDW: 16.9 % — ABNORMAL HIGH (ref 11.5–15.5)
Smear Review: NORMAL
WBC: 7.6 10*3/uL (ref 4.0–10.5)
nRBC: 0.3 % — ABNORMAL HIGH (ref 0.0–0.2)

## 2021-01-04 LAB — BLOOD GAS, VENOUS
Acid-Base Excess: 1.7 mmol/L (ref 0.0–2.0)
Bicarbonate: 28.3 mmol/L — ABNORMAL HIGH (ref 20.0–28.0)
FIO2: 0.6
MECHVT: 400 mL
O2 Saturation: 59.6 %
PEEP: 10 cmH2O
Patient temperature: 37
RATE: 24 resp/min
pCO2, Ven: 55 mmHg (ref 44.0–60.0)
pH, Ven: 7.32 (ref 7.250–7.430)
pO2, Ven: 34 mmHg (ref 32.0–45.0)

## 2021-01-04 LAB — COMPREHENSIVE METABOLIC PANEL
ALT: 24 U/L (ref 0–44)
AST: 35 U/L (ref 15–41)
Albumin: 2.5 g/dL — ABNORMAL LOW (ref 3.5–5.0)
Alkaline Phosphatase: 58 U/L (ref 38–126)
Anion gap: 9 (ref 5–15)
BUN: 61 mg/dL — ABNORMAL HIGH (ref 8–23)
CO2: 26 mmol/L (ref 22–32)
Calcium: 7.2 mg/dL — ABNORMAL LOW (ref 8.9–10.3)
Chloride: 111 mmol/L (ref 98–111)
Creatinine, Ser: 1.49 mg/dL — ABNORMAL HIGH (ref 0.44–1.00)
GFR, Estimated: 35 mL/min — ABNORMAL LOW (ref >60)
Glucose, Bld: 215 mg/dL — ABNORMAL HIGH (ref 70–99)
Potassium: 4.6 mmol/L (ref 3.5–5.1)
Sodium: 146 mmol/L — ABNORMAL HIGH (ref 135–145)
Total Bilirubin: 1 mg/dL (ref 0.3–1.2)
Total Protein: 7.1 g/dL (ref 6.5–8.1)

## 2021-01-04 LAB — PROTIME-INR
INR: 2.4 — ABNORMAL HIGH (ref 0.8–1.2)
Prothrombin Time: 25.4 seconds — ABNORMAL HIGH (ref 11.4–15.2)

## 2021-01-04 LAB — GLUCOSE, CAPILLARY
Glucose-Capillary: 129 mg/dL — ABNORMAL HIGH (ref 70–99)
Glucose-Capillary: 142 mg/dL — ABNORMAL HIGH (ref 70–99)
Glucose-Capillary: 151 mg/dL — ABNORMAL HIGH (ref 70–99)
Glucose-Capillary: 156 mg/dL — ABNORMAL HIGH (ref 70–99)
Glucose-Capillary: 161 mg/dL — ABNORMAL HIGH (ref 70–99)
Glucose-Capillary: 176 mg/dL — ABNORMAL HIGH (ref 70–99)
Glucose-Capillary: 180 mg/dL — ABNORMAL HIGH (ref 70–99)
Glucose-Capillary: 192 mg/dL — ABNORMAL HIGH (ref 70–99)

## 2021-01-04 LAB — TRIGLYCERIDES: Triglycerides: 122 mg/dL (ref <150)

## 2021-01-04 LAB — HEMOGLOBIN A1C
Hgb A1c MFr Bld: 5.6 % (ref 4.8–5.6)
Mean Plasma Glucose: 114.02 mg/dL

## 2021-01-04 LAB — FIBRIN DERIVATIVES D-DIMER (ARMC ONLY): Fibrin derivatives D-dimer (ARMC): 2297.7 ng/mL (FEU) — ABNORMAL HIGH (ref 0.00–499.00)

## 2021-01-04 LAB — C-REACTIVE PROTEIN: CRP: 15.3 mg/dL — ABNORMAL HIGH (ref <1.0)

## 2021-01-04 LAB — PHOSPHORUS: Phosphorus: 4.8 mg/dL — ABNORMAL HIGH (ref 2.5–4.6)

## 2021-01-04 LAB — APTT: aPTT: 33 seconds (ref 24–36)

## 2021-01-04 LAB — FERRITIN: Ferritin: 1240 ng/mL — ABNORMAL HIGH (ref 11–307)

## 2021-01-04 LAB — MAGNESIUM: Magnesium: 2.7 mg/dL — ABNORMAL HIGH (ref 1.7–2.4)

## 2021-01-04 LAB — HEPARIN LEVEL (UNFRACTIONATED): Heparin Unfractionated: 2.3 IU/mL — ABNORMAL HIGH (ref 0.30–0.70)

## 2021-01-04 MED ORDER — ALBUTEROL SULFATE (2.5 MG/3ML) 0.083% IN NEBU
2.5000 mg | INHALATION_SOLUTION | Freq: Four times a day (QID) | RESPIRATORY_TRACT | Status: DC
  Administered 2021-01-04: 2.5 mg via RESPIRATORY_TRACT
  Filled 2021-01-04: qty 3

## 2021-01-04 MED ORDER — HEPARIN (PORCINE) 25000 UT/250ML-% IV SOLN
1000.0000 [IU]/h | INTRAVENOUS | Status: DC
  Administered 2021-01-04 – 2021-01-05 (×2): 1000 [IU]/h via INTRAVENOUS
  Filled 2021-01-04 (×3): qty 250

## 2021-01-04 MED ORDER — MIDAZOLAM BOLUS VIA INFUSION
1.0000 mg | INTRAVENOUS | Status: DC | PRN
Start: 2021-01-04 — End: 2021-01-11
  Administered 2021-01-04 – 2021-01-07 (×4): 2 mg via INTRAVENOUS
  Filled 2021-01-04: qty 2

## 2021-01-04 MED ORDER — ALBUTEROL SULFATE (2.5 MG/3ML) 0.083% IN NEBU
INHALATION_SOLUTION | RESPIRATORY_TRACT | Status: AC
  Filled 2021-01-04: qty 3

## 2021-01-04 MED ORDER — DOCUSATE SODIUM 50 MG/5ML PO LIQD: 100.0000 mg | Freq: Two times a day (BID) | ORAL | Status: DC

## 2021-01-04 MED ORDER — POLYETHYLENE GLYCOL 3350 17 G PO PACK
17.0000 g | PACK | Freq: Every day | ORAL | Status: DC
  Filled 2021-01-04: qty 1

## 2021-01-04 MED ORDER — VITAL HIGH PROTEIN PO LIQD
1000.0000 mL | ORAL | Status: DC
  Administered 2021-01-04: 1000 mL

## 2021-01-04 MED ORDER — VITAL AF 1.2 CAL PO LIQD
1000.0000 mL | ORAL | Status: DC
  Administered 2021-01-04 – 2021-01-09 (×5): 1000 mL

## 2021-01-04 MED ORDER — PROSOURCE TF PO LIQD
45.0000 mL | Freq: Three times a day (TID) | ORAL | Status: DC
  Administered 2021-01-05 – 2021-01-11 (×19): 45 mL

## 2021-01-04 MED ORDER — ASPIRIN 81 MG PO CHEW
81.0000 mg | CHEWABLE_TABLET | Freq: Every day | ORAL | Status: DC
  Administered 2021-01-04 – 2021-01-11 (×8): 81 mg
  Filled 2021-01-04 (×8): qty 1

## 2021-01-04 MED ORDER — MIDAZOLAM 50MG/50ML (1MG/ML) PREMIX INFUSION
0.0000 mg/h | INTRAVENOUS | Status: DC
  Administered 2021-01-04: 2 mg/h via INTRAVENOUS
  Administered 2021-01-04 – 2021-01-08 (×9): 4 mg/h via INTRAVENOUS
  Administered 2021-01-09 – 2021-01-10 (×5): 5 mg/h via INTRAVENOUS
  Administered 2021-01-11: 4 mg/h via INTRAVENOUS
  Filled 2021-01-04 (×17): qty 50

## 2021-01-04 NOTE — Progress Notes (Signed)
[place]  [phone] [phone]   Name: Cynthia Wallace Date: 01/04/2021 MRN: [id number]  DOB: 1936/01/13  Patient Care Team: Idelle Crouch, MD as PCP - General (Internal Medicine) Lloyd Huger, MD as Consulting Physician (Oncology) Corey Skains, MD as Consulting Physician (Cardiology)    REASON FOR CONSULTATION: Cynthia Wallace is a 85 y.o. female with multiple medical problems including IgG myeloma, chronic anemia, history of CHF, A. fib on chronic anticoagulation, pulmonary hypertension, and CKD stage III.  Patient was admitted to hospital 12/19/2020 with progressive shortness of breath and was found to have Covid pneumonia.  Her hospitalization has been complicated by worsening respiratory failure requiring intubation.  Palliative care was consulted up address goals..   CODE STATUS: Full code  PAST MEDICAL HISTORY: Past Medical History:  Diagnosis Date  . Acid reflux `  . Adenomatous colon polyp   . Anemia   . Arthritis   . Asthma   . Atrial fibrillation ([place])   . Bacteremia   . Bilateral leg edema   . Bilateral leg edema   . Bronchitis   . Cardiomyopathy ([place])   . CHF (congestive heart failure) ([place])   . Chickenpox   . Chronic a-fib ([place])   . Chronic atrial fibrillation ([place])   . Chronic pulmonary hypertension ([place])   . Coronary artery disease   . Dyspnea on exertion   . Fibrocystic breast disease   . Hyperlipidemia   . Hypertension   . Iron deficiency anemia   . Localized edema   . MGUS (monoclonal gammopathy of unknown significance)   . Neuropathy   . Osteoporosis   . Pelvic pain   . Pneumonia   . PUD (peptic ulcer disease)   . Severe tricuspid valve insufficiency   . Sick sinus syndrome ([place])   . Sleep apnea   . Tricuspid valve insufficiency   . Venous insufficiency of both lower extremities     PAST SURGICAL HISTORY:  Past Surgical History:  Procedure Laterality  Date  . ABDOMINAL HYSTERECTOMY    . BILATERAL KNEE ARTHROSCOPY    . BREAST EXCISIONAL BIOPSY Right 1990   NEG  . BREAST SURGERY    . CARDIAC CATHETERIZATION    . COLONOSCOPY WITH PROPOFOL N/A 08/27/2016   Procedure: COLONOSCOPY WITH PROPOFOL;  Surgeon: Manya Silvas, MD;  Location: [facility] ENDOSCOPY;  Service: Endoscopy;  Laterality: N/A;  . COLONOSCOPY WITH PROPOFOL N/A 11/21/2020   Procedure: COLONOSCOPY WITH PROPOFOL;  Surgeon: Toledo, Benay Pike, MD;  Location: [facility] ENDOSCOPY;  Service: Gastroenterology;  Laterality: N/A;  . ESOPHAGOGASTRODUODENOSCOPY (EGD) WITH PROPOFOL N/A 11/21/2020   Procedure: ESOPHAGOGASTRODUODENOSCOPY (EGD) WITH PROPOFOL;  Surgeon: Toledo, Benay Pike, MD;  Location: [facility] ENDOSCOPY;  Service: Gastroenterology;  Laterality: N/A;  . INSERT / REPLACE / REMOVE PACEMAKER    . JOINT REPLACEMENT    . PACEMAKER INSERTION    . TUBAL LIGATION      HEMATOLOGY/ONCOLOGY HISTORY:  Oncology History   No history exists.    ALLERGIES:  is allergic to codeine and penicillin g.  MEDICATIONS:  Current Facility-Administered Medications  Medication Dose Route Frequency Provider Last Rate Last Admin  . 0.9 %  sodium chloride infusion  250 mL Intravenous Continuous Bronson Ing, MD 10 mL/hr at 01/03/21 1835 250 mL at 01/03/21 1835  . acetaminophen (TYLENOL) tablet 650 mg  650 mg Per Tube Q6H PRN Benn Moulder, RPH      . albuterol ([place]) (  2.5 MG/3ML) 0.083% nebulizer solution           . ascorbic acid (VITAMIN C) tablet 250 mg  250 mg Per Tube Daily Benn Moulder, RPH   250 mg at 01/04/21 1030  . aspirin chewable tablet 81 mg  81 mg Per Tube Daily Benita Gutter, RPH   81 mg at 01/04/21 1003  . atorvastatin (LIPITOR) tablet 10 mg  10 mg Per Tube Daily Benn Moulder, RPH   10 mg at 01/04/21 1314  . cefTRIAXone (ROCEPHIN) 2 g in sodium chloride 0.9 % 100 mL IVPB  2 g Intravenous Q24H Bennie Pierini, MD   Stopped at 01/04/21 1011  . chlorhexidine  gluconate (MEDLINE KIT) (PERIDEX) 0.12 % solution 15 mL  15 mL Mouth Rinse BID Rust-Chester, Britton L, NP   15 mL at 01/04/21 0757  . Chlorhexidine Gluconate Cloth 2 % PADS 6 each  6 each Topical Daily Alekh, Kshitiz, MD      . dexmedetomidine (PRECEDEX) 400 MCG/100ML (4 mcg/mL) infusion  0.4-1.2 mcg/kg/hr Intravenous Titrated Bronson Ing, MD 17.98 mL/hr at 01/04/21 0634 0.8 mcg/kg/hr at 01/04/21 0634  . docusate (COLACE) 50 MG/5ML liquid 100 mg  100 mg Per Tube BID Bronson Ing, MD   100 mg at 01/04/21 3888  . docusate sodium (COLACE) capsule 100 mg  100 mg Oral BID PRN Benn Moulder, RPH      . feeding supplement (VITAL HIGH PROTEIN) liquid 1,000 mL  1,000 mL Per Tube Q24H Bennie Pierini, MD   1,000 mL at 01/04/21 0940  . fentaNYL (SUBLIMAZE) bolus via infusion 25 mcg  25 mcg Intravenous Q15 min PRN Nam, Earley Abide, MD      . fentaNYL 251mg in NS 2532m(1037mml) infusion-PREMIX  25-200 mcg/hr Intravenous Continuous NamBronson IngD 17.5 mL/hr at 01/04/21 1040 175 mcg/hr at 01/04/21 1040  . insulin aspart (novoLOG) injection 0-15 Units  0-15 Units Subcutaneous Q4H Rust-Chester, Britton L, NP   3 Units at 01/04/21 0757  . MEDLINE mouth rinse  15 mL Mouth Rinse 10 times per day Rust-Chester, Britton L, NP   15 mL at 01/04/21 0949  . methylPREDNISolone sodium succinate (SOLU-MEDROL) 125 mg/2 mL injection 60 mg  60 mg Intravenous Q12H Alekh, Kshitiz, MD   60 mg at 01/04/21 0057  . midazolam (VERSED) 50 mg/50 mL (1 mg/mL) premix infusion  0-10 mg/hr Intravenous Continuous SchBennie PieriniD 2 mL/hr at 01/04/21 0959 2 mg/hr at 01/04/21 0959  . midazolam (VERSED) bolus via infusion 1-2 mg  1-2 mg Intravenous Q2H PRN SchBennie PieriniD      . montelukast (SINGULAIR) tablet 10 mg  10 mg Per Tube QHS CunBenn MoulderPH   10 mg at 01/03/21 2336  . multivitamin with minerals tablet 1 tablet  1 tablet Per Tube Daily CunBenn MoulderPH   1 tablet at 01/04/21 [id number]  norepinephrine (LEVOPHED) 4mg75m 250mL72mmix infusion  5-50 mcg/min Intravenous Titrated Nam, Bronson Ing75 mL/hr at 01/04/21 0756 20 mcg/min at 01/04/21 0756  . pantoprazole (PROTONIX) EC tablet 40 mg  40 mg Oral Daily Acheampong, PeterWarnell Bureau  40 mg at 01/04/21 0938 7282olyethylene glycol (MIRALAX / GLYCOLAX) packet 17 g  17 g Per Tube Daily Nam, Bronson Ing  17 g at 01/04/21 0939  . polyethylene glycol (MIRALAX / GLYCOLAX) packet 17 g  17 g Oral Daily PRN Nam, Arcelia Jew  J, MD      . polyethylene glycol (MIRALAX / GLYCOLAX) packet 17 g  17 g Per Tube Daily Schertz, Michele Mcalpine, MD      . propofol (DIPRIVAN) 1000 MG/100ML infusion  0-50 mcg/kg/min Intravenous Continuous Bronson Ing, MD   Stopped at 01/03/21 1900  . senna-docusate (Senokot-S) tablet 1 tablet  1 tablet Per Tube QHS PRN Benn Moulder, RPH      . traMADol Veatrice [place]) tablet 50 mg  50 mg Per Tube Q6H PRN Benn Moulder, RPH      . traZODone (DESYREL) tablet 50 mg  50 mg Per Tube QHS Deatra Robinson B, RPH   50 mg at 01/03/21 2337    VITAL SIGNS: BP (!) 148/81   Pulse 71   Temp (!) 96.4 F (35.8 C)   Resp 14   Ht 5' 5"  (1.651 m)   Wt 198 lb 3.1 oz (89.9 kg)   SpO2 97%   BMI 32.98 kg/m  Filed Weights   12/11/2020 1858 01/03/21 0500 01/04/21 0500  Weight: 200 lb (90.7 kg) 198 lb 3.1 oz (89.9 kg) 198 lb 3.1 oz (89.9 kg)    Estimated body mass index is 32.98 kg/m as calculated from the following:   Height as of this encounter: 5' 5"  (1.651 m).   Weight as of this encounter: 198 lb 3.1 oz (89.9 kg).  LABS: CBC:    Component Value Date/Time   WBC 7.6 01/04/2021 0315   HGB 8.2 (L) 01/04/2021 0315   HGB 11.9 (L) 03/05/2015 1116   HCT 27.6 (L) 01/04/2021 0315   HCT 36.6 03/05/2015 1116   PLT 181 01/04/2021 0315   PLT 154 03/05/2015 1116   MCV 106.2 (H) 01/04/2021 0315   MCV 96 03/05/2015 1116   NEUTROABS 6.3 01/04/2021 0315   NEUTROABS 4.5 03/05/2015 1116   LYMPHSABS 0.6 (L) 01/04/2021 0315   LYMPHSABS 1.6  03/05/2015 1116   MONOABS 0.6 01/04/2021 0315   MONOABS 0.6 03/05/2015 1116   EOSABS 0.0 01/04/2021 0315   EOSABS 0.1 03/05/2015 1116   BASOSABS 0.0 01/04/2021 0315   BASOSABS 0.0 03/05/2015 1116   Comprehensive Metabolic Panel:    Component Value Date/Time   NA 146 (H) 01/04/2021 0315   NA 139 03/05/2015 1116   K 4.6 01/04/2021 0315   K 4.1 03/05/2015 1116   CL 111 01/04/2021 0315   CL 106 03/05/2015 1116   CO2 26 01/04/2021 0315   CO2 28 03/05/2015 1116   BUN 61 (H) 01/04/2021 0315   BUN 23 (H) 03/05/2015 1116   CREATININE 1.49 (H) 01/04/2021 0315   CREATININE 0.75 03/05/2015 1116   GLUCOSE 215 (H) 01/04/2021 0315   GLUCOSE 92 03/05/2015 1116   CALCIUM 7.2 (L) 01/04/2021 0315   CALCIUM 8.9 03/05/2015 1116   AST 35 01/04/2021 0315   ALT 24 01/04/2021 0315   ALKPHOS 58 01/04/2021 0315   BILITOT 1.0 01/04/2021 0315   PROT 7.1 01/04/2021 0315   ALBUMIN 2.5 (L) 01/04/2021 0315    RADIOGRAPHIC STUDIES: DG Chest 2 View  Result Date: 01/04/2021 CLINICAL DATA:  Shortness of breath EXAM: CHEST - 2 VIEW COMPARISON:  11/09/2020 FINDINGS: Cardiomegaly left chest multi lead pacer. Scattered bilateral heterogeneous airspace opacities, improved compared to prior examination. The visualized skeletal structures are unremarkable. IMPRESSION: 1. Scattered bilateral heterogeneous airspace opacities, improved compared to prior examination, and consistent with improved infection and/or edema. 2.  Cardiomegaly. Electronically Signed   By: Eddie Candle M.D.   On:  01/03/2021 19:25   DG Abd 1 View  Result Date: 01/03/2021 CLINICAL DATA:  Intubation, OG tube EXAM: ABDOMEN - 1 VIEW COMPARISON:  None. FINDINGS: OG tube tip is in the mid stomach. Nonobstructive bowel gas pattern. IMPRESSION: OG tube tip in the mid stomach. Electronically Signed   By: Rolm Baptise M.D.   On: 01/03/2021 18:21   CT Head Wo Contrast  Result Date: 01/03/2021 CLINICAL DATA:  Mental status changes EXAM: CT HEAD WITHOUT  CONTRAST TECHNIQUE: Contiguous axial images were obtained from the base of the skull through the vertex without intravenous contrast. COMPARISON:  09/25/2016 FINDINGS: Brain: There is atrophy and chronic small vessel disease changes. No acute intracranial abnormality. Specifically, no hemorrhage, hydrocephalus, mass lesion, acute infarction, or significant intracranial injury. Vascular: No hyperdense vessel or unexpected calcification. Skull: No acute calvarial abnormality. Sinuses/Orbits: Air-fluid levels in the maxillary sinuses and sphenoid sinuses. Mucosal thickening throughout the ethmoid air cells. Other: None IMPRESSION: Atrophy, chronic microvascular disease. No acute intracranial abnormality. Acute on chronic pan sinusitis. Electronically Signed   By: Rolm Baptise M.D.   On: 12/23/2020 21:47   CT ANGIO CHEST PE W OR WO CONTRAST  Result Date: 01/02/2021 CLINICAL DATA:  COVID-19 positive, pneumonia, worsening hypoxia EXAM: CT ANGIOGRAPHY CHEST WITH CONTRAST TECHNIQUE: Multidetector CT imaging of the chest was performed using the standard protocol during bolus administration of intravenous contrast. Multiplanar CT image reconstructions and MIPs were obtained to evaluate the vascular anatomy. CONTRAST:  97m OMNIPAQUE IOHEXOL 350 MG/ML SOLN COMPARISON:  12/15/2020, 11/24/2017 FINDINGS: Cardiovascular: This is a technically adequate evaluation of the pulmonary vasculature. No filling defects or pulmonary emboli. The heart is enlarged, with significant biatrial dilatation right greater than left. Dual lead pacemaker is noted, lead within the right atrium and right ventricle. No pericardial effusion. No evidence of thoracic aortic aneurysm or dissection. Mild atherosclerosis of the aorta. Severe coronary artery atherosclerosis unchanged. Mediastinum/Nodes: No enlarged mediastinal, hilar, or axillary lymph nodes. Thyroid gland, trachea, and esophagus demonstrate no significant findings. Lungs/Pleura: There is  significant multifocal bilateral airspace disease, which has progressed since the 01/01/2021 x-ray. The most dense consolidation is seen within the lung bases. There are trace bilateral pleural effusions. No pneumothorax. The central airways are patent. Upper Abdomen: Significant reflux of contrast into the hepatic veins suggests cardiac dysfunction. No acute upper abdominal process. Musculoskeletal: No acute or destructive bony lesions. Reconstructed images demonstrate no additional findings. Review of the MIP images confirms the above findings. IMPRESSION: 1. No evidence of pulmonary embolus. 2. Progressive widespread bilateral airspace disease, with associated cardiomegaly and trace bilateral pleural effusions. Pattern of findings suggest congestive heart failure. Superimposed infection cannot be excluded. 3. Aortic Atherosclerosis (ICD10-I70.0). Coronary artery atherosclerosis. Electronically Signed   By: MRanda NgoM.D.   On: 01/02/2021 17:08   DG Chest Port 1 View  Result Date: 01/03/2021 CLINICAL DATA:  85year old female status post intubation. EXAM: PORTABLE CHEST 1 VIEW COMPARISON:  Chest CT dated 01/02/2021. FINDINGS: Endotracheal tube with tip approximately 2.5 cm above the carina. Enteric tube extends below the diaphragm with tip beyond the inferior margin of the image. Right IJ central venous line with tip over the right atrium close to the cavoatrial junction. Bilateral pulmonary opacities consistent with multifocal pneumonia. No pleural effusion pneumothorax. Mild cardiomegaly. Left pectoral pacemaker device. No acute osseous pathology. Degenerative changes of the spine. IMPRESSION: 1. Endotracheal tube above the carina. 2. Bilateral pulmonary opacities consistent with multifocal pneumonia. Electronically Signed   By: ALaren EvertsD.  On: 01/03/2021 18:13   ECHOCARDIOGRAM COMPLETE  Result Date: 01/01/2021    ECHOCARDIOGRAM REPORT   Patient Name:   Cynthia Wallace Date of Exam:  01/01/2021 Medical Rec #:  [id number]      Height:       65.0 in Accession #:    [id number]     Weight:       200.0 lb Date of Birth:  18-Aug-1936      BSA:          1.978 m Patient Age:    18 years       BP:           110/58 mmHg Patient Gender: F              HR:           74 bpm. Exam Location:  [facility] Procedure: 2D Echo, Color Doppler and Cardiac Doppler Indications:     I48.91 Atrial Fibrillation  History:         Patient has no prior history of Echocardiogram examinations.                  CHF and Cardiomyopathy, CAD; Risk Factors:Sleep Apnea,                  Hypertension and Dyslipidemia. Pt tested positive for COVID-19                  on 01/07/2021.  Sonographer:     Charmayne Sheer RDCS (AE) Referring Phys:  [id number] [place] Diagnosing Phys: Bartholome Bill MD  Sonographer Comments: Suboptimal apical window. IMPRESSIONS  1. Left ventricular ejection fraction, by estimation, is 65 to 70%. Left ventricular ejection fraction by PLAX is 71 %. The left ventricle has normal function. The left ventricle has no regional wall motion abnormalities. Left ventricular diastolic parameters were normal.  2. Right ventricular systolic function is normal. The right ventricular size is normal.  3. Left atrial size was mildly dilated.  4. The mitral valve is grossly normal. Moderate mitral valve regurgitation.  5. Tricuspid valve regurgitation is moderate to severe.  6. The aortic valve is grossly normal. Aortic valve regurgitation is trivial. FINDINGS  Left Ventricle: Left ventricular ejection fraction, by estimation, is 65 to 70%. Left ventricular ejection fraction by PLAX is 71 %. The left ventricle has normal function. The left ventricle has no regional wall motion abnormalities. The left ventricular internal cavity size was normal in size. There is no left ventricular hypertrophy. Left ventricular diastolic parameters were normal. Right Ventricle: The right ventricular size is normal. No increase in right ventricular wall  thickness. Right ventricular systolic function is normal. Left Atrium: Left atrial size was mildly dilated. Right Atrium: Right atrial size was normal in size. Pericardium: There is no evidence of pericardial effusion. Mitral Valve: The mitral valve is grossly normal. Moderate mitral valve regurgitation. MV peak gradient, 10.1 mmHg. The mean mitral valve gradient is 5.0 mmHg. Tricuspid Valve: The tricuspid valve is not well visualized. Tricuspid valve regurgitation is moderate to severe. Aortic Valve: The aortic valve is grossly normal. Aortic valve regurgitation is trivial. Aortic valve mean gradient measures 7.0 mmHg. Aortic valve peak gradient measures 13.8 mmHg. Aortic valve area, by VTI measures 1.39 cm. Pulmonic Valve: The pulmonic valve was not well visualized. Pulmonic valve regurgitation is trivial. Aorta: The aortic root is normal in size and structure. IAS/Shunts: No atrial level shunt detected by color flow Doppler.  LEFT VENTRICLE PLAX 2D  LV EF:         Left            Diastology                ventricular     LV e' medial:    7.94 cm/s                ejection        LV E/e' medial:  16.8                fraction by     LV e' lateral:   11.90 cm/s                PLAX is 71      LV E/e' lateral: 11.2                %. LVIDd:         4.90 cm LVIDs:         2.90 cm LV PW:         1.20 cm LV IVS:        1.00 cm LVOT diam:     1.70 cm LV SV:         41 LV SV Index:   21 LVOT Area:     2.27 cm  RIGHT VENTRICLE RV Basal diam:  4.40 cm LEFT ATRIUM         Index      RIGHT ATRIUM           Index LA diam:    5.20 cm 2.63 cm/m RA Area:     27.20 cm                                RA Volume:   84.10 ml  42.51 ml/m  AORTIC VALVE                    PULMONIC VALVE AV Area (Vmax):    1.27 cm     PV Vmax:       1.42 m/s AV Area (Vmean):   1.29 cm     PV Vmean:      91.500 cm/s AV Area (VTI):     1.39 cm     PV VTI:        0.280 m AV Vmax:           186.00 cm/s  PV Peak grad:  8.1 mmHg AV Vmean:          120.000  cm/s PV Mean grad:  4.0 mmHg AV VTI:            0.295 m AV Peak Grad:      13.8 mmHg AV Mean Grad:      7.0 mmHg LVOT Vmax:         104.00 cm/s LVOT Vmean:        68.400 cm/s LVOT VTI:          0.181 m LVOT/AV VTI ratio: 0.61  AORTA Ao Root diam: 2.90 cm MITRAL VALVE                TRICUSPID VALVE MV Area (PHT): 2.94 cm     TR Peak grad:   28.1 mmHg MV Area VTI:   1.43 cm     TR Vmax:        265.00 cm/s MV Peak grad:  10.1 mmHg MV  Mean grad:  5.0 mmHg     SHUNTS MV Vmax:       1.59 m/s     Systemic VTI:  0.18 m MV Vmean:      107.0 cm/s   Systemic Diam: 1.70 cm MV Decel Time: 258 msec MV E velocity: 133.67 cm/s Bartholome Bill MD Electronically signed by Bartholome Bill MD Signature Date/Time: 01/01/2021/4:36:21 PM    Final     PERFORMANCE STATUS (ECOG) : 4 - Bedbound  Review of Systems Unable to complete  Physical Exam General: Ill-appearing in ICU Cardiovascular: regular rate and rhythm Pulmonary: Unlabored, synchronous on the vent Extremities: no edema, no joint deformities Skin: no rashes Neurological: Sedated  IMPRESSION: Patient was intubated on 01/03/2021 for progressive hypoxic respiratory failure after failing BiPAP.  She remains on ventilator.  She is requiring low-dose Levophed.  Patient is sedated and overall comfortable appearing.  I called and updated patient's daughter.  She says that she was able to speak with patient prior to intubation and that she felt that patient clearly understood the clinical situation.  Patient verbalized a desire to remain full code.  Daughter says that she is trying to be optimistic although she understands that patient might not survive this hospitalization.  She remains in agreement with current scope of treatment.  Emotional support provided.  PLAN: -Continue current scope of treatment -Full code for now -Will follow  Case and plan discussed with Dr. Grayland Ormond who also spoke with patient's daughter.   Time Total: 25 minutes  Visit consisted of  counseling and education dealing with the complex and emotionally intense issues of symptom management and palliative care in the setting of serious and potentially life-threatening illness.Greater than 50%  of this time was spent counseling and coordinating care related to the above assessment and plan.  Signed by: Altha Harm, PhD, NP-C

## 2021-01-04 NOTE — Consult Note (Signed)
ANTICOAGULATION CONSULT NOTE  Pharmacy Consult for Heparin Infusion Indication: atrial fibrillation  Patient Measurements: Heparin Dosing Weight: 77.1 kg  Labs: Recent Labs    01/02/21 0602 01/03/21 0532 01/04/21 0315  HGB 8.0* 8.3* 8.2*  HCT 24.2* 26.5* 27.6*  PLT 70* 104* 181  CREATININE 1.21* 1.31* 1.49*    Estimated Creatinine Clearance: 32.3 mL/min (A) (by C-G formula based on SCr of 1.49 mg/dL (H)).   Medical History: Past Medical History:  Diagnosis Date  . Acid reflux `  . Adenomatous colon polyp   . Anemia   . Arthritis   . Asthma   . Atrial fibrillation ([place])   . Bacteremia   . Bilateral leg edema   . Bilateral leg edema   . Bronchitis   . Cardiomyopathy ([place])   . CHF (congestive heart failure) ([place])   . Chickenpox   . Chronic a-fib ([place])   . Chronic atrial fibrillation ([place])   . Chronic pulmonary hypertension ([place])   . Coronary artery disease   . Dyspnea on exertion   . Fibrocystic breast disease   . Hyperlipidemia   . Hypertension   . Iron deficiency anemia   . Localized edema   . MGUS (monoclonal gammopathy of unknown significance)   . Neuropathy   . Osteoporosis   . Pelvic pain   . Pneumonia   . PUD (peptic ulcer disease)   . Severe tricuspid valve insufficiency   . Sick sinus syndrome ([place])   . Sleep apnea   . Tricuspid valve insufficiency   . Venous insufficiency of both lower extremities     Medications:  Xarelto 15mg  daily (last dose 1/27 at 1858)  Assessment:  85 yo female with multiple medical comorbidities that include IgG myeloma, chronic anemia, chronic systolic CHF on chronic diuretics, atrial fibrillation on chronic anticoagulation, chronic pulmonary hypertension, CKD Stage III, hypertension and hyperlipidemia. Pt was intubated the evening of 1/27 and placed on mechanical ventilation.   Pharmacy was consulted to switch Xarelto to IV Heparin infusion.   Baseline HL is 2.3, aPTT is 33. Platelets 70>104>181 H/H are stable,  and platelets have stabilized.   Goal of Therapy:  APTT goal 66-102  Monitor platelets by anticoagulation protocol: Yes   Plan:  Initiate Heparin infusion at 1000 units/hr at 2000. Will follow aPTT based on Xarelto interference with HL. Recheck HL, aPTT  8 hours after infusion. Can follow HL when aPTT correlates with HL Monitor daily HL, CBC, and signs and symptoms of bleeding.   Emogene Morgan, Pharmacy Student  01/04/2021,9:24 AM

## 2021-01-04 NOTE — Progress Notes (Signed)
Initial Nutrition Assessment  DOCUMENTATION CODES:   Obesity unspecified  INTERVENTION:   Change to Vital 1.2 @50ml /hr- Initiate at 45ml/hr and increase by 64ml q 12 hours until goal rate is reached.   Pro-Source 37ml TID via tube, provides 40kcal and 11g of protein per serving   Free water flushes 13ml q4 hours to maintain tube patency   Regimen provides 1560kcal/day, 123g/day protein and 1146ml/day of free water.   Pt at high refeed risk; recommend monitor potassium, magnesium and phosphorus labs daily until stable  NUTRITION DIAGNOSIS:   Inadequate oral intake related to inability to eat (pt sedated and ventilated) as evidenced by NPO status.  GOAL:   Provide needs based on ASPEN/SCCM guidelines  MONITOR:   Vent status,Labs,Weight trends,TF tolerance,Skin,I & O's  REASON FOR ASSESSMENT:   Consult Enteral/tube feeding initiation and management  ASSESSMENT:   85 y.o. female with multiple medical problems including IgG myeloma, chronic anemia, history of CHF, A. fib on chronic anticoagulation, pulmonary hypertension, and CKD stage III.  Patient was admitted to hospital 01/04/21 with progressive shortness of breath and was found to have Covid pneumonia.  Pt sedated and ventilated. OGT in place. Pt tolerating tube feeds. Suspect pt with poor appetite and oral intake pta r/t COVID. Per chart, pt is down 29lbs(13%) in 2 months; this is severe weight loss. Pt is at high refeed risk.   Medications reviewed and include: vitamin C, aspirin, colace, insulin, solu-medrol, MVI, protonix, miralax, ceftriaxone, precedex, fentanyl, heparin, levophed  Labs reviewed: Na 146(H), BUN 61(H), creat 1.49(H), P 4.8(H), Mg 2.7(H) Hgb 8.2(L), Hct 27.6(L) cbgs- 180, 176, 142 x 24 hrs AIC 5.6- 1/27  Patient is currently intubated on ventilator support MV: 9.5 L/min Temp (24hrs), Avg:96.2 F (35.7 C), Min:96 F (35.6 C), Max:96.4 F (35.8 C)  Propofol: none   MAP- > 3mmHg  UOP-  240ml/hr   NUTRITION - FOCUSED PHYSICAL EXAM: Unable to perform at this time  Diet Order:   Diet Order            Diet NPO time specified  Diet effective now                EDUCATION NEEDS:   No education needs have been identified at this time  Skin:  Skin Assessment: Reviewed RN Assessment  Last BM:  1/26  Height:   Ht Readings from Last 1 Encounters:  01/04/2021 5\' 5"  (1.651 m)    Weight:   Wt Readings from Last 1 Encounters:  01/04/21 89.9 kg    Ideal Body Weight:  56.8 kg  BMI:  Body mass index is 32.98 kg/m.  Estimated Nutritional Needs:   Kcal:  1400kcal/day  Protein:  >115g/day  Fluid:  1.4-1.7L/day  Koleen Distance MS, RD, LDN Please refer to [facility] for RD and/or RD on-call/weekend/after hours pager

## 2021-01-04 NOTE — Progress Notes (Signed)
Name: Cynthia Wallace MRN: [id number] DOB: 1936/08/21     CONSULTATION DATE: 12/19/2020  REFERRING MD :  Starla Link  CHIEF COMPLAINT:  Hypoxic respiratory failure  HISTORY OF PRESENT ILLNESS:  Per admission H and P: "Cynthia Wallace is an 85 y.o. female with multiple medical comorbidities that include IgG myeloma, chronic anemia, chronic systolic CHF(unknwn LVEF) on chronic diuretics, atrial fibrillation on chronic anticoagulation, chronic pulmonary hypertension,CKD stage III, PPM, hypertension and hyperlipidemia. As per patient, over the past several months, she has had progressive worsening shortness of breath from her baseline, however over the past few days, she has felt extremely weak and tired with minimal exertion. She has had poor oral intake and poor appetite over the past few days. She is up-to-date with her vaccinations. Family decided to bring patient into the emergency room to be further evaluated after some concerns about altered mental status from her baseline. No reported falls at home. Denies any chest pain or palpitations. Denies fever or chills. On presentation, patient was noted to be positive for Covid. Due to patient being a poor historian and difficult to assess duration of her symptoms. Chest x-ray was concerning for bilateral heterogeneous space opacities."  Hospital Course: CT-PA on 01/02/2021 showed no evidence of PE. Patient treated for COVID 19 pneumonia and superimposed bacterial pneumonia. Was started on 6L HFNC on 01/02/2021 and then transitioned to 50L HFNC this morning. Transferred to ICU as stepdown and started on BPAP. Patient continued to desaturate with any movement. Had to bump up optiflow to 100%. Patient remains full code. Had extensive discussion with patient and daughter. Patient understands that prognosis likely remains poor. But she would like to do a trial of intubation and mechanical ventilation. Intubated the evening of 01/03/2021.   PAST MEDICAL HISTORY :   has  a past medical history of Acid reflux (`), Adenomatous colon polyp, Anemia, Arthritis, Asthma, Atrial fibrillation ([place]), Bacteremia, Bilateral leg edema, Bilateral leg edema, Bronchitis, Cardiomyopathy ([place]), CHF (congestive heart failure) ([place]), Chickenpox, Chronic a-fib ([facility]), Chronic atrial fibrillation ([place]), Chronic pulmonary hypertension ([place]), Coronary artery disease, Dyspnea on exertion, Fibrocystic breast disease, Hyperlipidemia, Hypertension, Iron deficiency anemia, Localized edema, MGUS (monoclonal gammopathy of unknown significance), Neuropathy, Osteoporosis, Pelvic pain, Pneumonia, PUD (peptic ulcer disease), Severe tricuspid valve insufficiency, Sick sinus syndrome ([place]), Sleep apnea, Tricuspid valve insufficiency, and Venous insufficiency of both lower extremities.  has a past surgical history that includes Abdominal hysterectomy; Joint replacement; Pacemaker insertion; Breast excisional biopsy (Right, 1990); Tubal ligation; Insert / replace / remove pacemaker; Bilateral knee arthroscopy; Cardiac catheterization; Colonoscopy with propofol (N/A, 08/27/2016); Breast surgery; Colonoscopy with propofol (N/A, 11/21/2020); and Esophagogastroduodenoscopy (egd) with propofol (N/A, 11/21/2020). Prior to Admission medications   Medication Sig Start Date End Date Taking? Authorizing Provider  albuterol (PROVENTIL) (2.5 MG/3ML) 0.083% nebulizer solution Inhale into the lungs every 4 (four) hours as needed. 03/13/16  Yes [provider]  ascorbic acid (VITAMIN C) 100 MG tablet Take 100 mg by mouth daily.   Yes [provider]  atorvastatin (LIPITOR) 10 MG tablet Take 10 mg by mouth daily.   Yes [provider]  B Complex Vitamins (VITAMIN B COMPLEX PO) Take 1 tablet by mouth daily.   Yes [provider]  Cholecalciferol 25 MCG (1000 UT) tablet Take 1,000 Units by mouth daily.   Yes [provider]  dexamethasone (DECADRON) 4 MG tablet Take 5 tablets (20 mg total)  by mouth once a week. Take with food. 11/15/20  Yes Lloyd Huger,  MD  digoxin (LANOXIN) 0.125 MG tablet Take 0.125 mg by mouth daily.    Yes [provider]  esomeprazole (NEXIUM) 10 MG packet Take 10 mg by mouth daily before breakfast.   Yes [provider]  fluticasone (FLONASE) 50 MCG/ACT nasal spray SHAKE LIQUID AND USE 2 SPRAYS IN EACH NOSTRIL EVERY DAY AS NEEDED FOR RHINITIS OR ALLERGIES 02/01/18  Yes [provider]  furosemide (LASIX) 20 MG tablet Take 40 mg by mouth daily. Pt to take 2 tablets once a day    Yes [provider]  hydrOXYzine (ATARAX/VISTARIL) 10 MG tablet Take 1 tablet by mouth 3 (three) times daily. 03/31/17  Yes [provider]  lenalidomide (REVLIMID) 15 MG capsule Take 1 capsule (15 mg total) by mouth daily. for 21 days with then 7 days off. 12/26/20  Yes Finnegan, Kathlene November, MD  levocetirizine (XYZAL) 5 MG tablet Take 5 mg by mouth daily. 05/14/20  Yes [provider]  losartan (COZAAR) 50 MG tablet Take 50 mg by mouth 2 (two) times daily.   Yes [provider]  Melatonin 10 MG TABS Take 1 tablet by mouth at bedtime as needed. Takes at night   Yes [provider]  metoprolol succinate (TOPROL-XL) 50 MG 24 hr tablet Take 50 mg by mouth 2 (two) times daily.   Yes [provider]  montelukast (SINGULAIR) 10 MG tablet Take 10 mg by mouth at bedtime.   Yes [provider]  pantoprazole (PROTONIX) 40 MG tablet Take 40 mg by mouth daily. 06/15/18 01/01/21 Yes [provider]  potassium chloride (K-DUR) 10 MEQ tablet Take 10 mEq by mouth 3 (three) times daily.    Yes [provider]  rivaroxaban (XARELTO) 20 MG TABS tablet Take 20 mg by mouth daily with supper.   Yes [provider]  traMADol (ULTRAM) 50 MG tablet Take 50 mg by mouth 4 (four) times daily as needed. 03/27/20  Yes [provider]  traZODone (DESYREL) 50 MG tablet Take 50 mg by mouth at bedtime.    Yes [provider]  vitamin B-12 (CYANOCOBALAMIN) 1000 MCG tablet Take by mouth.   Yes [provider]   Allergies  Allergen Reactions  . Codeine Nausea Only  . Penicillin G Other (See Comments)    As a child    FAMILY HISTORY:  family history includes Breast cancer in her sister; Cancer in her sister; Heart disease in her father, mother, and sister; Hyperlipidemia in her mother and sister; Hypertension in her mother; Stroke in her sister. She was adopted. SOCIAL HISTORY:  reports that she has never smoked. She has never used smokeless tobacco. She reports that she does not drink alcohol and does not use drugs.  REVIEW OF SYSTEMS:   Unable to obtain due to critical illness    Estimated body mass index is 32.98 kg/m as calculated from the following:   Height as of this encounter: 5\' 5"  (1.651 m).   Weight as of this encounter: 89.9 kg.  VITAL SIGNS: Temp:  [96 F (35.6 C)-97 F (36.1 C)] 96.4 F (35.8 C) (01/27 1845) Pulse Rate:  [67-90] 71 (01/28 0700) Resp:  [11-36] 14 (01/28 0700) BP: (67-168)/(39-107) 148/81 (01/28 0700) SpO2:  [89 %-100 %] 96 % (01/28 0700) FiO2 (%):  [60 %-100 %] 60 % (01/28 0725) Weight:  [89.9 kg] 89.9 kg (01/28 0500)   I/O last 3 completed shifts: In: 1389.2 [I.V.:975.6; IV Piggyback:413.6] Out: [id number] [Urine:1625] No intake/output data recorded.  SpO2: 96 % O2 Flow Rate (L/min): 0 L/min FiO2 (%): 60 %   Physical Examination:  GENERAL: intubated and sedated HEAD: Normocephalic, atraumatic.  EYES: Pupils equal, round, reactive to light.  No scleral icterus.  MOUTH: Moist mucosal membrane. NECK: Supple. No JVD.  PULMONARY: CTA b/l, no W/C/R CARDIOVASCULAR: S1 and S2. Regular rate and rhythm. No murmurs, rubs, or gallops.  GASTROINTESTINAL: Soft, nontender, nondistended.  Positive bowel sounds.  MUSCULOSKELETAL: No swelling, clubbing, or edema.  NEUROLOGIC: obtunded SKIN:intact,warm,dry  I personally reviewed lab  work that was obtained in last 24 hrs. CXR Independently reviewed showing dense bilateral opacities consistent with Covid-19 pneumonia. ETT positioned appropriately.  MEDICATIONS: I have reviewed all medications and confirmed regimen as documented   CULTURE RESULTS   Recent Results (from the past 240 hour(s))  SARS Coronavirus 2 by RT PCR (hospital order, performed in [facility] hospital lab) Nasopharyngeal Nasopharyngeal Swab     Status: Abnormal   Collection Time: 2021-01-17  9:08 PM   Specimen: Nasopharyngeal Swab  Result Value Ref Range Status   SARS Coronavirus 2 POSITIVE (A) NEGATIVE Final    Comment: RESULT CALLED TO, READ BACK BY AND VERIFIED WITH: NOAH CRIFFINTH AT 2233 ON 01-17-21 BY SS (NOTE) SARS-CoV-2 target nucleic acids are DETECTED  SARS-CoV-2 RNA is generally detectable in upper respiratory specimens  during the acute phase of infection.  Positive results are indicative  of the presence of the identified virus, but do not rule out bacterial infection or co-infection with other pathogens not detected by the test.  Clinical correlation with patient history and  other diagnostic information is necessary to determine patient infection status.  The expected result is negative.  Fact Sheet for Patients:   StrictlyIdeas.no   Fact Sheet for Healthcare Providers:   BankingDealers.co.za    This test is not yet approved or cleared by the [place] FDA and  has been authorized for detection and/or diagnosis of SARS-CoV-2 by FDA under an Emergency Use Authorization (EUA).  This EUA will remain in effect (meaning th is test can be used) for the duration of  the COVID-19 declaration under Section 564(b)(1) of the Act, 21 U.S.C. section 360-bbb-3(b)(1), unless the authorization is terminated or revoked sooner.  Performed at [facility], [address], [place], [place] [address]   Urine culture     Status:  Abnormal   Collection Time: 2021-01-17  9:08 PM   Specimen: Urine, Random  Result Value Ref Range Status   Specimen Description   Final    URINE, RANDOM Performed at [facility], [address], [place], [place] [address]    Special Requests   Final    NONE Performed at [facility], [address] [place], [place] [address]    Culture (A)  Final    <10,000 COLONIES/mL INSIGNIFICANT GROWTH Performed at [facility], [place] [address] [place], [place] [address]    Report Status 01/02/2021 FINAL  Final  Blood culture (routine x 2)     Status: None (Preliminary result)   Collection Time: 01/17/2021 10:50 PM   Specimen: BLOOD  Result Value Ref Range Status   Specimen Description BLOOD RIGHT ASSIST CONTROL  Final   Special Requests   Final    BOTTLES DRAWN AEROBIC AND ANAEROBIC Blood Culture adequate volume   Culture   Final    NO GROWTH 4 DAYS Performed at [facility], [address], [place], [place] [address]    Report Status PENDING  Incomplete  Blood culture (routine x 2)     Status: None (Preliminary result)   Collection Time: 12/23/2020 10:51 PM   Specimen: BLOOD  Result Value Ref Range Status   Specimen Description BLOOD LEFT ASSIST CONTROL  Final   Special Requests   Final    BOTTLES DRAWN AEROBIC AND ANAEROBIC Blood Culture adequate volume   Culture   Final    NO GROWTH 4 DAYS Performed at [facility], [address] [place], [place] [address]    Report Status PENDING  Incomplete          IMAGING    DG Abd 1 View  Result Date: 01/03/2021 CLINICAL DATA:  Intubation, OG tube EXAM: ABDOMEN - 1 VIEW COMPARISON:  None. FINDINGS: OG tube tip is in the mid stomach. Nonobstructive bowel gas pattern. IMPRESSION: OG tube tip in the mid stomach. Electronically Signed   By: Rolm Baptise M.D.   On: 01/03/2021 18:21   DG Chest Port 1 View  Result Date: 01/03/2021 CLINICAL DATA:  85 year old female status post intubation. EXAM:  PORTABLE CHEST 1 VIEW COMPARISON:  Chest CT dated 01/02/2021. FINDINGS: Endotracheal tube with tip approximately 2.5 cm above the carina. Enteric tube extends below the diaphragm with tip beyond the inferior margin of the image. Right IJ central venous line with tip over the right atrium close to the cavoatrial junction. Bilateral pulmonary opacities consistent with multifocal pneumonia. No pleural effusion pneumothorax. Mild cardiomegaly. Left pectoral pacemaker device. No acute osseous pathology. Degenerative changes of the spine. IMPRESSION: 1. Endotracheal tube above the carina. 2. Bilateral pulmonary opacities consistent with multifocal pneumonia. Electronically Signed   By: Anner Crete M.D.   On: 01/03/2021 18:13     Nutrition Status:        Indwelling Urinary Catheter continued, requirement due to   Reason to continue Indwelling Urinary Catheter strict Intake/Output monitoring for hemodynamic instability   Central Line/ continued, requirement due to  Reason to continue [place] of central venous pressure or other hemodynamic parameters and poor IV access   Ventilator continued, requirement due to severe respiratory failure   Ventilator Sedation RASS -2 to -3   ASSESSMENT AND PLAN SYNOPSIS 85 y.o. female with Covid-19 pneumonia, intubated on 1/27 for worsening respiratory failure.  PULMONARY Severe ACUTE Hypoxic and Hypercapnic Respiratory Failure Covid-19 pneumonia -continue Full MV support -continue Bronchodilator Therapy -Wean Fio2 and PEEP as tolerated -Vent weaning protocol -VAP/VENT bundle implementation -Continue steroids, remdesivir for COVID pneumonia -Not a candidate for baricitinib given ongoing care and chemotherapy for myeloma -Palliative following along since admission to hospitalist team  RENAL -Cr currently 1.31. Likely background CKD -continue Foley Catheter, assess need daily -Avoid nephrotoxic agents -Follow urine output, BMP -Ensure  adequate renal perfusion, optimize oxygenation -Renal dose medications   NEUROLOGY -intubated and sedated -minimal sedation to achieve a RASS goal: -2 to -3 -transition from Precedex to Versed gtt given poor responsiveness to Precedex and hypotension with propofol -continue fentanyl gtt  CARDIAC SHOCK ACUTE on CHRONIC DIASTOLIC CARDIAC FAILURE- EF 65% Chronic Afib -pressors as needed for MAP goal >65 -hypotension improved as sedation is weaned -Hold Lasix given pressor requirement -Echo showed EF of 65-70% -Fluid restriction -continue to monitor on continuous telemetry -currently rate controlled -transition from Xarelto to heparin gtt  HEME Chronic anemia IgG Myeloma Pancytopenia -currently undergoing chemotherapy -appreciate inpatient eval and recs  ID -Continue CAP coverage x5 days, completed azithromycin  GI GI PROPHYLAXIS  NUTRITIONAL STATUS DIET-->TF Constipation protocol as indicated  ENDO - will use ICU hypoglycemic\Hyperglycemia protocol if needed  ELECTROLYTES -follow labs as needed -replace as needed -pharmacy consultation and following  DVT/GI PRX ordered and assessed TRANSFUSIONS AS NEEDED MONITOR FSBS I Assessed the need for Labs I Assessed the need for Foley I Assessed the need for Central Venous Line Family Discussion when available I Assessed the need for Mobilization I made an Assessment of medications to be adjusted accordingly Safety Risk assessment Completed  CASE DISCUSSED IN MULTIDISCIPLINARY ROUNDS WITH ICU TEAM   Critical Care Time devoted to patient care services described in this note is 45 minutes.   Overall, patient is critically ill, prognosis is guarded.  Patient with Multiorgan failure and at high risk for cardiac arrest and death.    Bennie Pierini, MD 01/04/21 4:08 PM

## 2021-01-05 DIAGNOSIS — R4182 Altered mental status, unspecified: Secondary | ICD-10-CM | POA: Diagnosis not present

## 2021-01-05 DIAGNOSIS — J9601 Acute respiratory failure with hypoxia: Secondary | ICD-10-CM | POA: Diagnosis not present

## 2021-01-05 LAB — CBC WITH DIFFERENTIAL/PLATELET
Abs Immature Granulocytes: 0.05 10*3/uL (ref 0.00–0.07)
Basophils Absolute: 0 10*3/uL (ref 0.0–0.1)
Basophils Relative: 0 %
Eosinophils Absolute: 0 10*3/uL (ref 0.0–0.5)
Eosinophils Relative: 0 %
HCT: 23.9 % — ABNORMAL LOW (ref 36.0–46.0)
Hemoglobin: 7.3 g/dL — ABNORMAL LOW (ref 12.0–15.0)
Immature Granulocytes: 2 %
Lymphocytes Relative: 17 %
Lymphs Abs: 0.5 10*3/uL — ABNORMAL LOW (ref 0.7–4.0)
MCH: 32.6 pg (ref 26.0–34.0)
MCHC: 30.5 g/dL (ref 30.0–36.0)
MCV: 106.7 fL — ABNORMAL HIGH (ref 80.0–100.0)
Monocytes Absolute: 0.3 10*3/uL (ref 0.1–1.0)
Monocytes Relative: 11 %
Neutro Abs: 2.1 10*3/uL (ref 1.7–7.7)
Neutrophils Relative %: 70 %
Platelets: 151 10*3/uL (ref 150–400)
RBC: 2.24 MIL/uL — ABNORMAL LOW (ref 3.87–5.11)
RDW: 17 % — ABNORMAL HIGH (ref 11.5–15.5)
Smear Review: NORMAL
WBC: 3 10*3/uL — ABNORMAL LOW (ref 4.0–10.5)
nRBC: 2 % — ABNORMAL HIGH (ref 0.0–0.2)

## 2021-01-05 LAB — COMPREHENSIVE METABOLIC PANEL
ALT: 21 U/L (ref 0–44)
AST: 28 U/L (ref 15–41)
Albumin: 2.4 g/dL — ABNORMAL LOW (ref 3.5–5.0)
Alkaline Phosphatase: 58 U/L (ref 38–126)
Anion gap: 10 (ref 5–15)
BUN: 72 mg/dL — ABNORMAL HIGH (ref 8–23)
CO2: 25 mmol/L (ref 22–32)
Calcium: 7.6 mg/dL — ABNORMAL LOW (ref 8.9–10.3)
Chloride: 112 mmol/L — ABNORMAL HIGH (ref 98–111)
Creatinine, Ser: 1.45 mg/dL — ABNORMAL HIGH (ref 0.44–1.00)
GFR, Estimated: 36 mL/min — ABNORMAL LOW (ref >60)
Glucose, Bld: 174 mg/dL — ABNORMAL HIGH (ref 70–99)
Potassium: 4.2 mmol/L (ref 3.5–5.1)
Sodium: 147 mmol/L — ABNORMAL HIGH (ref 135–145)
Total Bilirubin: 0.8 mg/dL (ref 0.3–1.2)
Total Protein: 6.9 g/dL (ref 6.5–8.1)

## 2021-01-05 LAB — GLUCOSE, CAPILLARY
Glucose-Capillary: 150 mg/dL — ABNORMAL HIGH (ref 70–99)
Glucose-Capillary: 173 mg/dL — ABNORMAL HIGH (ref 70–99)
Glucose-Capillary: 149 mg/dL — ABNORMAL HIGH (ref 70–99)
Glucose-Capillary: 148 mg/dL — ABNORMAL HIGH (ref 70–99)
Glucose-Capillary: 149 mg/dL — ABNORMAL HIGH (ref 70–99)

## 2021-01-05 LAB — BLOOD GAS, VENOUS
Acid-Base Excess: 2.1 mmol/L — ABNORMAL HIGH (ref 0.0–2.0)
Bicarbonate: 28.5 mmol/L — ABNORMAL HIGH (ref 20.0–28.0)
FIO2: 0.4
MECHVT: 400 mL
Mechanical Rate: 24
O2 Saturation: 69.1 %
PEEP: 10 cmH2O
Patient temperature: 37
pCO2, Ven: 54 mmHg (ref 44.0–60.0)
pH, Ven: 7.33 (ref 7.250–7.430)
pO2, Ven: 39 mmHg (ref 32.0–45.0)

## 2021-01-05 LAB — CULTURE, BLOOD (ROUTINE X 2)
Culture: NO GROWTH
Culture: NO GROWTH
Special Requests: ADEQUATE
Special Requests: ADEQUATE

## 2021-01-05 LAB — C-REACTIVE PROTEIN: CRP: 12.4 mg/dL — ABNORMAL HIGH (ref <1.0)

## 2021-01-05 LAB — MAGNESIUM: Magnesium: 3.1 mg/dL — ABNORMAL HIGH (ref 1.7–2.4)

## 2021-01-05 LAB — PHOSPHORUS: Phosphorus: 3 mg/dL (ref 2.5–4.6)

## 2021-01-05 LAB — HEPARIN LEVEL (UNFRACTIONATED): Heparin Unfractionated: 1.1 IU/mL — ABNORMAL HIGH (ref 0.30–0.70)

## 2021-01-05 LAB — FERRITIN: Ferritin: 1057 ng/mL — ABNORMAL HIGH (ref 11–307)

## 2021-01-05 LAB — FIBRIN DERIVATIVES D-DIMER (ARMC ONLY): Fibrin derivatives D-dimer (ARMC): 3130.32 ng/mL (FEU) — ABNORMAL HIGH (ref 0.00–499.00)

## 2021-01-05 LAB — APTT
aPTT: 71 seconds — ABNORMAL HIGH (ref 24–36)
aPTT: 92 seconds — ABNORMAL HIGH (ref 24–36)

## 2021-01-05 MED ORDER — PANTOPRAZOLE SODIUM 40 MG PO PACK
40.0000 mg | PACK | Freq: Every day | ORAL | Status: DC
  Administered 2021-01-05 – 2021-01-11 (×7): 40 mg
  Filled 2021-01-05 (×7): qty 20

## 2021-01-05 NOTE — Progress Notes (Signed)
Name: Cynthia Wallace MRN: [id number] DOB: 1936/08/21     CONSULTATION DATE: 12/19/2020  REFERRING MD :  Starla Link  CHIEF COMPLAINT:  Hypoxic respiratory failure  HISTORY OF PRESENT ILLNESS:  Per admission H and P: "Cynthia Wallace is an 85 y.o. female with multiple medical comorbidities that include IgG myeloma, chronic anemia, chronic systolic CHF(unknwn LVEF) on chronic diuretics, atrial fibrillation on chronic anticoagulation, chronic pulmonary hypertension,CKD stage III, PPM, hypertension and hyperlipidemia. As per patient, over the past several months, she has had progressive worsening shortness of breath from her baseline, however over the past few days, she has felt extremely weak and tired with minimal exertion. She has had poor oral intake and poor appetite over the past few days. She is up-to-date with her vaccinations. Family decided to bring patient into the emergency room to be further evaluated after some concerns about altered mental status from her baseline. No reported falls at home. Denies any chest pain or palpitations. Denies fever or chills. On presentation, patient was noted to be positive for Covid. Due to patient being a poor historian and difficult to assess duration of her symptoms. Chest x-ray was concerning for bilateral heterogeneous space opacities."  Hospital Course: CT-PA on 01/02/2021 showed no evidence of PE. Patient treated for COVID 19 pneumonia and superimposed bacterial pneumonia. Was started on 6L HFNC on 01/02/2021 and then transitioned to 50L HFNC this morning. Transferred to ICU as stepdown and started on BPAP. Patient continued to desaturate with any movement. Had to bump up optiflow to 100%. Patient remains full code. Had extensive discussion with patient and daughter. Patient understands that prognosis likely remains poor. But she would like to do a trial of intubation and mechanical ventilation. Intubated the evening of 01/03/2021.   PAST MEDICAL HISTORY :   has  a past medical history of Acid reflux (`), Adenomatous colon polyp, Anemia, Arthritis, Asthma, Atrial fibrillation ([place]), Bacteremia, Bilateral leg edema, Bilateral leg edema, Bronchitis, Cardiomyopathy ([place]), CHF (congestive heart failure) ([place]), Chickenpox, Chronic a-fib ([facility]), Chronic atrial fibrillation ([place]), Chronic pulmonary hypertension ([place]), Coronary artery disease, Dyspnea on exertion, Fibrocystic breast disease, Hyperlipidemia, Hypertension, Iron deficiency anemia, Localized edema, MGUS (monoclonal gammopathy of unknown significance), Neuropathy, Osteoporosis, Pelvic pain, Pneumonia, PUD (peptic ulcer disease), Severe tricuspid valve insufficiency, Sick sinus syndrome ([place]), Sleep apnea, Tricuspid valve insufficiency, and Venous insufficiency of both lower extremities.  has a past surgical history that includes Abdominal hysterectomy; Joint replacement; Pacemaker insertion; Breast excisional biopsy (Right, 1990); Tubal ligation; Insert / replace / remove pacemaker; Bilateral knee arthroscopy; Cardiac catheterization; Colonoscopy with propofol (N/A, 08/27/2016); Breast surgery; Colonoscopy with propofol (N/A, 11/21/2020); and Esophagogastroduodenoscopy (egd) with propofol (N/A, 11/21/2020). Prior to Admission medications   Medication Sig Start Date End Date Taking? Authorizing Provider  albuterol (PROVENTIL) (2.5 MG/3ML) 0.083% nebulizer solution Inhale into the lungs every 4 (four) hours as needed. 03/13/16  Yes [provider]  ascorbic acid (VITAMIN C) 100 MG tablet Take 100 mg by mouth daily.   Yes [provider]  atorvastatin (LIPITOR) 10 MG tablet Take 10 mg by mouth daily.   Yes [provider]  B Complex Vitamins (VITAMIN B COMPLEX PO) Take 1 tablet by mouth daily.   Yes [provider]  Cholecalciferol 25 MCG (1000 UT) tablet Take 1,000 Units by mouth daily.   Yes [provider]  dexamethasone (DECADRON) 4 MG tablet Take 5 tablets (20 mg total)  by mouth once a week. Take with food. 11/15/20  Yes Lloyd Huger,  MD  digoxin (LANOXIN) 0.125 MG tablet Take 0.125 mg by mouth daily.    Yes [provider]  esomeprazole (NEXIUM) 10 MG packet Take 10 mg by mouth daily before breakfast.   Yes [provider]  fluticasone (FLONASE) 50 MCG/ACT nasal spray SHAKE LIQUID AND USE 2 SPRAYS IN EACH NOSTRIL EVERY DAY AS NEEDED FOR RHINITIS OR ALLERGIES 02/01/18  Yes [provider]  furosemide (LASIX) 20 MG tablet Take 40 mg by mouth daily. Pt to take 2 tablets once a day    Yes [provider]  hydrOXYzine (ATARAX/VISTARIL) 10 MG tablet Take 1 tablet by mouth 3 (three) times daily. 03/31/17  Yes [provider]  lenalidomide (REVLIMID) 15 MG capsule Take 1 capsule (15 mg total) by mouth daily. for 21 days with then 7 days off. 12/26/20  Yes Finnegan, Kathlene November, MD  levocetirizine (XYZAL) 5 MG tablet Take 5 mg by mouth daily. 05/14/20  Yes [provider]  losartan (COZAAR) 50 MG tablet Take 50 mg by mouth 2 (two) times daily.   Yes [provider]  Melatonin 10 MG TABS Take 1 tablet by mouth at bedtime as needed. Takes at night   Yes [provider]  metoprolol succinate (TOPROL-XL) 50 MG 24 hr tablet Take 50 mg by mouth 2 (two) times daily.   Yes [provider]  montelukast (SINGULAIR) 10 MG tablet Take 10 mg by mouth at bedtime.   Yes [provider]  pantoprazole (PROTONIX) 40 MG tablet Take 40 mg by mouth daily. 06/15/18 01/01/21 Yes [provider]  potassium chloride (K-DUR) 10 MEQ tablet Take 10 mEq by mouth 3 (three) times daily.    Yes [provider]  rivaroxaban (XARELTO) 20 MG TABS tablet Take 20 mg by mouth daily with supper.   Yes [provider]  traMADol (ULTRAM) 50 MG tablet Take 50 mg by mouth 4 (four) times daily as needed. 03/27/20  Yes [provider]  traZODone (DESYREL) 50 MG tablet Take 50 mg by mouth at bedtime.    Yes [provider]  vitamin B-12 (CYANOCOBALAMIN) 1000 MCG tablet Take by mouth.   Yes [provider]   Allergies  Allergen Reactions  . Codeine Nausea Only  . Penicillin G Other (See Comments)    As a child    FAMILY HISTORY:  family history includes Breast cancer in her sister; Cancer in her sister; Heart disease in her father, mother, and sister; Hyperlipidemia in her mother and sister; Hypertension in her mother; Stroke in her sister. She was adopted. SOCIAL HISTORY:  reports that she has never smoked. She has never used smokeless tobacco. She reports that she does not drink alcohol and does not use drugs.  REVIEW OF SYSTEMS:   Unable to obtain due to critical illness    Estimated body mass index is 32.98 kg/m as calculated from the following:   Height as of this encounter: 5\' 5"  (1.651 m).   Weight as of this encounter: 89.9 kg.  VITAL SIGNS: Temp:  [96.4 F (35.8 C)-96.6 F (35.9 C)] 96.6 F (35.9 C) (01/29 1615) Pulse Rate:  [56-81] 70 (01/29 1730) Resp:  [15-43] 22 (01/29 1730) BP: (95-136)/(48-72) 115/53 (01/29 1730) SpO2:  [90 %-99 %] 93 % (01/29 1730) FiO2 (%):  [40 %] 40 % (01/29 1416) Weight:  [89.9 kg] 89.9 kg (01/29 0429)   I/O last 3 completed shifts: In: 2486.2 [I.V.:2246.1; NG/GT:240.2] Out: 2570 [Urine:2570] Total I/O In: 1428.2 [I.V.:524; NG/GT:904.2] Out:  575 [Urine:575]   SpO2: 93 % O2 Flow Rate (L/min): 0 L/min FiO2 (%): 40 %   Physical Examination:  GENERAL: intubated and sedated HEAD: Normocephalic, atraumatic.  EYES: Pupils equal, round, reactive to light.  No scleral icterus.  MOUTH: Moist mucosal membrane. NECK: Supple. No JVD.  PULMONARY: CTA b/l, no W/C/R CARDIOVASCULAR: S1 and S2. Regular rate and rhythm. No murmurs, rubs, or gallops.  GASTROINTESTINAL: Soft, nontender, nondistended.  Positive bowel sounds.  MUSCULOSKELETAL: No swelling, clubbing, or edema.  NEUROLOGIC: obtunded SKIN:intact,warm,dry  I  personally reviewed lab work that was obtained in last 24 hrs. CXR Independently reviewed showing dense bilateral opacities consistent with Covid-19 pneumonia. ETT positioned appropriately.  MEDICATIONS: I have reviewed all medications and confirmed regimen as documented   CULTURE RESULTS   Recent Results (from the past 240 hour(s))  SARS Coronavirus 2 by RT PCR (hospital order, performed in [facility] hospital lab) Nasopharyngeal Nasopharyngeal Swab     Status: Abnormal   Collection Time: 12/28/2020  9:08 PM   Specimen: Nasopharyngeal Swab  Result Value Ref Range Status   SARS Coronavirus 2 POSITIVE (A) NEGATIVE Final    Comment: RESULT CALLED TO, READ BACK BY AND VERIFIED WITH: NOAH CRIFFINTH AT 2233 ON 12/15/2020 BY SS (NOTE) SARS-CoV-2 target nucleic acids are DETECTED  SARS-CoV-2 RNA is generally detectable in upper respiratory specimens  during the acute phase of infection.  Positive results are indicative  of the presence of the identified virus, but do not rule out bacterial infection or co-infection with other pathogens not detected by the test.  Clinical correlation with patient history and  other diagnostic information is necessary to determine patient infection status.  The expected result is negative.  Fact Sheet for Patients:   StrictlyIdeas.no   Fact Sheet for Healthcare Providers:   BankingDealers.co.za    This test is not yet approved or cleared by the [place] FDA and  has been authorized for detection and/or diagnosis of SARS-CoV-2 by FDA under an Emergency Use Authorization (EUA).  This EUA will remain in effect (meaning th is test can be used) for the duration of  the COVID-19 declaration under Section 564(b)(1) of the Act, 21 U.S.C. section 360-bbb-3(b)(1), unless the authorization is terminated or revoked sooner.  Performed at [facility], [address], [place], [place] [address]   Urine  culture     Status: Abnormal   Collection Time: 12/26/2020  9:08 PM   Specimen: Urine, Random  Result Value Ref Range Status   Specimen Description   Final    URINE, RANDOM Performed at [facility], [address] [place], [place] [address]    Special Requests   Final    NONE Performed at [facility], [address] [place], [place] [address]    Culture (A)  Final    <10,000 COLONIES/mL INSIGNIFICANT GROWTH Performed at [facility],  [address] [place], [place] [address]    Report Status 01/02/2021 FINAL  Final  Blood culture (routine x 2)     Status: None   Collection Time: 12/08/2020 10:50 PM   Specimen: BLOOD  Result Value Ref Range Status   Specimen Description BLOOD RIGHT ASSIST CONTROL  Final   Special Requests   Final    BOTTLES DRAWN AEROBIC AND ANAEROBIC Blood Culture adequate volume   Culture   Final    NO GROWTH 5 DAYS Performed at [facility], [address] [place],  [address]    Report  Status 01/05/2021 FINAL  Final  Blood culture (routine x 2)     Status: None   Collection Time: 2021/01/30 10:51 PM   Specimen: BLOOD  Result Value Ref Range Status   Specimen Description BLOOD LEFT ASSIST CONTROL  Final   Special Requests   Final    BOTTLES DRAWN AEROBIC AND ANAEROBIC Blood Culture adequate volume   Culture   Final    NO GROWTH 5 DAYS Performed at [facility], [address] [place], [place] [address]    Report Status 01/05/2021 FINAL  Final  Culture, respiratory (non-expectorated)     Status: None (Preliminary result)   Collection Time: 01/04/21 11:42 AM   Specimen: Tracheal Aspirate; Respiratory  Result Value Ref Range Status   Specimen Description   Final    TRACHEAL ASPIRATE Performed at [facility], [address] [place], [place] [address]    Special Requests   Final    Normal Performed at [facility], [place], [place] [address]    Gram Stain    Final    RARE WBC PRESENT,BOTH PMN AND MONONUCLEAR MODERATE YEAST FEW GRAM NEGATIVE RODS    Culture   Final    RARE PSEUDOMONAS AERUGINOSA SUSCEPTIBILITIES TO FOLLOW Performed at [facility], [place] [address], [place],  [address]    Report Status PENDING  Incomplete          IMAGING    No results found.   Nutrition Status: Nutrition Problem: Inadequate oral intake Etiology: inability to eat (pt sedated and ventilated) Signs/Symptoms: NPO status    Indwelling Urinary Catheter continued, requirement due to   Reason to continue Indwelling Urinary Catheter strict Intake/Output monitoring for hemodynamic instability   Central Line/ continued, requirement due to  Reason to continue [place] of central venous pressure or other hemodynamic parameters and poor IV access   Ventilator continued, requirement due to severe respiratory failure   Ventilator Sedation RASS -2 to -3   ASSESSMENT AND PLAN SYNOPSIS 85 y.o. female with Covid-19 pneumonia, intubated on 1/27 for worsening respiratory failure.  PULMONARY Severe ACUTE Hypoxic and Hypercapnic Respiratory Failure Covid-19 pneumonia -continue Full MV support with lung protective strategies -continue Bronchodilator Therapy -Wean Fio2 and PEEP as tolerated -Vent weaning protocol -VAP/VENT bundle implementation -Continue steroids, remdesivir for COVID pneumonia -Not a candidate for baricitinib given ongoing care and chemotherapy for myeloma -Palliative following along since admission to hospitalist team  RENAL Net -1.9L for stay -Cr currently 1.45. Likely background CKD, seems stable -continue Foley Catheter, assess need daily -Avoid nephrotoxic agents -Follow urine output, BMP, Na rising -Ensure adequate renal perfusion, optimize oxygenation -Renal dose medications   NEUROLOGY -intubated and sedated -minimal sedation to achieve a RASS goal: -2 to -3 -transition from Precedex to Versed gtt  given poor responsiveness to Precedex and hypotension with propofol -continue fentanyl gtt  CARDIAC SHOCK ACUTE on CHRONIC DIASTOLIC CARDIAC FAILURE- EF 65% Chronic Afib -pressors as needed for MAP goal >65 -hypotension improved as sedation is weaned -Hold Lasix given pressor requirement -Echo showed EF of 65-70% -Fluid restriction -continue to monitor on continuous telemetry -currently rate controlled -transition from Xarelto to heparin gtt  HEME Chronic anemia IgG Myeloma Pancytopenia -currently undergoing chemotherapy -platelets improved, -leukopenia persists -Transfuse <7 HgB, follow -appreciate inpatient eval and recs  ID -Continue CAP coverage x5 days, completed azithromycin  GI GI PROPHYLAXIS  NUTRITIONAL STATUS DIET-->TF Constipation protocol as indicated  ENDO - will use ICU hypoglycemic\Hyperglycemia protocol if  needed  ELECTROLYTES -follow labs as needed, Na climbing -replace as needed -pharmacy consultation and following  DVT/GI PRX ordered and assessed TRANSFUSIONS AS NEEDED MONITOR FSBS I Assessed the need for Labs I Assessed the need for Foley I Assessed the need for Central Venous Line Family Discussion when available I Assessed the need for Mobilization I made an Assessment of medications to be adjusted accordingly Safety Risk assessment Completed  CASE DISCUSSED IN MULTIDISCIPLINARY ROUNDS WITH ICU TEAM   Critical Care Time devoted to patient care services described in this note is 35 minutes.   Overall, patient is critically ill, prognosis is guarded.  Patient with Multiorgan failure and at high risk for cardiac arrest and death.    Nelle Don, MD 01/26/21 5:50 PM

## 2021-01-05 NOTE — Consult Note (Signed)
ANTICOAGULATION CONSULT NOTE  Pharmacy Consult for Heparin Infusion Indication: atrial fibrillation  Patient Measurements: Heparin Dosing Weight: 77.1 kg  Labs: Recent Labs    01/03/21 0532 01/04/21 0315 01/04/21 0927 01/05/21 0436  HGB 8.3* 8.2*  --  7.3*  HCT 26.5* 27.6*  --  23.9*  PLT 104* 181  --  151  APTT  --   --  33 71*  LABPROT  --   --  25.4*  --   INR  --   --  2.4*  --   HEPARINUNFRC  --   --  2.30* 1.10*  CREATININE 1.31* 1.49*  --   --     Estimated Creatinine Clearance: 32.3 mL/min (A) (by C-G formula based on SCr of 1.49 mg/dL (H)).   Medical History: Past Medical History:  Diagnosis Date  . Acid reflux `  . Adenomatous colon polyp   . Anemia   . Arthritis   . Asthma   . Atrial fibrillation ([place])   . Bacteremia   . Bilateral leg edema   . Bilateral leg edema   . Bronchitis   . Cardiomyopathy ([place])   . CHF (congestive heart failure) ([place])   . Chickenpox   . Chronic a-fib ([place])   . Chronic atrial fibrillation ([place])   . Chronic pulmonary hypertension ([place])   . Coronary artery disease   . Dyspnea on exertion   . Fibrocystic breast disease   . Hyperlipidemia   . Hypertension   . Iron deficiency anemia   . Localized edema   . MGUS (monoclonal gammopathy of unknown significance)   . Neuropathy   . Osteoporosis   . Pelvic pain   . Pneumonia   . PUD (peptic ulcer disease)   . Severe tricuspid valve insufficiency   . Sick sinus syndrome ([place])   . Sleep apnea   . Tricuspid valve insufficiency   . Venous insufficiency of both lower extremities     Medications:  Xarelto 15mg  daily (last dose 1/27 at 1858)  Assessment:  85 yo female with multiple medical comorbidities that include IgG myeloma, chronic anemia, chronic systolic CHF on chronic diuretics, atrial fibrillation on chronic anticoagulation, chronic pulmonary hypertension, CKD Stage III, hypertension and hyperlipidemia. Pt was intubated the evening of 1/27 and placed on mechanical  ventilation.   Pharmacy was consulted to switch Xarelto to IV Heparin infusion.   Baseline HL is 2.3, aPTT is 33. Platelets 70>104>181 H/H are stable, and platelets have stabilized.   Goal of Therapy:  APTT goal 66-102  Monitor platelets by anticoagulation protocol: Yes   1/29 0436 aPTT 71, therapeutic, HL 1.10   Plan:  Continue heparin infusion at 1000 units/hr. Folllowing aPTT based on Xarelto interference with HL. Recheck aPTT  8 hours to confirm then daily HL daily until correlates with aPTT Monitor daily CBC, and s/s of bleeding.   Renda Rolls, PharmD, [facility] 01/05/2021 6:44 AM

## 2021-01-05 NOTE — Consult Note (Signed)
ANTICOAGULATION CONSULT NOTE  Pharmacy Consult for Heparin Infusion Indication: atrial fibrillation  Patient Measurements: Heparin Dosing Weight: 77.1 kg  Labs: Recent Labs    01/03/21 0532 01/04/21 0315 01/04/21 0927 01/05/21 0436 01/05/21 1233  HGB 8.3* 8.2*  --  7.3*  --   HCT 26.5* 27.6*  --  23.9*  --   PLT 104* 181  --  151  --   APTT  --   --  33 71* 92*  LABPROT  --   --  25.4*  --   --   INR  --   --  2.4*  --   --   HEPARINUNFRC  --   --  2.30* 1.10*  --   CREATININE 1.31* 1.49*  --  1.45*  --     Estimated Creatinine Clearance: 33.2 mL/min (A) (by C-G formula based on SCr of 1.45 mg/dL (H)).   Medical History: Past Medical History:  Diagnosis Date  . Acid reflux `  . Adenomatous colon polyp   . Anemia   . Arthritis   . Asthma   . Atrial fibrillation ([place])   . Bacteremia   . Bilateral leg edema   . Bilateral leg edema   . Bronchitis   . Cardiomyopathy ([place])   . CHF (congestive heart failure) ([place])   . Chickenpox   . Chronic a-fib ([place])   . Chronic atrial fibrillation ([place])   . Chronic pulmonary hypertension ([place])   . Coronary artery disease   . Dyspnea on exertion   . Fibrocystic breast disease   . Hyperlipidemia   . Hypertension   . Iron deficiency anemia   . Localized edema   . MGUS (monoclonal gammopathy of unknown significance)   . Neuropathy   . Osteoporosis   . Pelvic pain   . Pneumonia   . PUD (peptic ulcer disease)   . Severe tricuspid valve insufficiency   . Sick sinus syndrome ([place])   . Sleep apnea   . Tricuspid valve insufficiency   . Venous insufficiency of both lower extremities     Medications:  Xarelto 15mg  daily (last dose 1/27 at 1858)  Assessment:  85 yo female with multiple medical comorbidities that include IgG myeloma, chronic anemia, chronic systolic CHF on chronic diuretics, atrial fibrillation on chronic anticoagulation, chronic pulmonary hypertension, CKD Stage III, hypertension and hyperlipidemia. Pt was  intubated the evening of 1/27 and placed on mechanical ventilation.   Pharmacy was consulted to switch Xarelto to IV Heparin infusion.   Baseline HL is 2.3, aPTT is 33. Platelets 70>104>181 H/H are stable, and platelets have stabilized.   Goal of Therapy:  APTT goal 66-102  Monitor platelets by anticoagulation protocol: Yes   1/29 0436 aPTT 71, therapeutic, HL 1.10 1/29 1233 aPTT 92, therapeutic x 2   Plan:  Continue heparin infusion at 1000 units/hr. APTT and HL with morning labs Monitor daily CBC, and s/s of bleeding.   Dorena Bodo, PharmD 01/05/2021 2:35 PM

## 2021-01-06 ENCOUNTER — Encounter: Payer: Self-pay | Admitting: Internal Medicine

## 2021-01-06 ENCOUNTER — Inpatient Hospital Stay: Payer: PPO

## 2021-01-06 DIAGNOSIS — J1282 Pneumonia due to coronavirus disease 2019: Secondary | ICD-10-CM | POA: Diagnosis not present

## 2021-01-06 DIAGNOSIS — U071 COVID-19: Secondary | ICD-10-CM | POA: Diagnosis not present

## 2021-01-06 LAB — BASIC METABOLIC PANEL
Anion gap: 7 (ref 5–15)
BUN: 86 mg/dL — ABNORMAL HIGH (ref 8–23)
CO2: 27 mmol/L (ref 22–32)
Calcium: 8 mg/dL — ABNORMAL LOW (ref 8.9–10.3)
Chloride: 116 mmol/L — ABNORMAL HIGH (ref 98–111)
Creatinine, Ser: 1.46 mg/dL — ABNORMAL HIGH (ref 0.44–1.00)
GFR, Estimated: 36 mL/min — ABNORMAL LOW (ref >60)
Glucose, Bld: 158 mg/dL — ABNORMAL HIGH (ref 70–99)
Potassium: 4.4 mmol/L (ref 3.5–5.1)
Sodium: 150 mmol/L — ABNORMAL HIGH (ref 135–145)

## 2021-01-06 LAB — CBC
HCT: 21.7 % — ABNORMAL LOW (ref 36.0–46.0)
Hemoglobin: 6.8 g/dL — ABNORMAL LOW (ref 12.0–15.0)
MCH: 32.2 pg (ref 26.0–34.0)
MCHC: 31.3 g/dL (ref 30.0–36.0)
MCV: 102.8 fL — ABNORMAL HIGH (ref 80.0–100.0)
Platelets: 130 10*3/uL — ABNORMAL LOW (ref 150–400)
RBC: 2.11 MIL/uL — ABNORMAL LOW (ref 3.87–5.11)
RDW: 16.7 % — ABNORMAL HIGH (ref 11.5–15.5)
WBC: 1.9 10*3/uL — ABNORMAL LOW (ref 4.0–10.5)
nRBC: 10.1 % — ABNORMAL HIGH (ref 0.0–0.2)

## 2021-01-06 LAB — HEMOGLOBIN AND HEMATOCRIT, BLOOD
HCT: 23.5 % — ABNORMAL LOW (ref 36.0–46.0)
Hemoglobin: 7.5 g/dL — ABNORMAL LOW (ref 12.0–15.0)

## 2021-01-06 LAB — PREPARE RBC (CROSSMATCH)

## 2021-01-06 LAB — FERRITIN: Ferritin: 1030 ng/mL — ABNORMAL HIGH (ref 11–307)

## 2021-01-06 LAB — GLUCOSE, CAPILLARY
Glucose-Capillary: 148 mg/dL — ABNORMAL HIGH (ref 70–99)
Glucose-Capillary: 149 mg/dL — ABNORMAL HIGH (ref 70–99)
Glucose-Capillary: 154 mg/dL — ABNORMAL HIGH (ref 70–99)
Glucose-Capillary: 170 mg/dL — ABNORMAL HIGH (ref 70–99)
Glucose-Capillary: 179 mg/dL — ABNORMAL HIGH (ref 70–99)

## 2021-01-06 LAB — APTT: aPTT: 143 seconds — ABNORMAL HIGH (ref 24–36)

## 2021-01-06 LAB — FIBRIN DERIVATIVES D-DIMER (ARMC ONLY): Fibrin derivatives D-dimer (ARMC): 2351.83 ng/mL (FEU) — ABNORMAL HIGH (ref 0.00–499.00)

## 2021-01-06 LAB — PHOSPHORUS: Phosphorus: 2.7 mg/dL (ref 2.5–4.6)

## 2021-01-06 LAB — HEPARIN LEVEL (UNFRACTIONATED): Heparin Unfractionated: 0.78 IU/mL — ABNORMAL HIGH (ref 0.30–0.70)

## 2021-01-06 LAB — MAGNESIUM: Magnesium: 3.2 mg/dL — ABNORMAL HIGH (ref 1.7–2.4)

## 2021-01-06 MED ORDER — FREE WATER
200.0000 mL | Status: DC
  Administered 2021-01-07 – 2021-01-09 (×14): 200 mL

## 2021-01-06 MED ORDER — FREE WATER
100.0000 mL | Status: DC
  Administered 2021-01-06 (×3): 100 mL

## 2021-01-06 MED ORDER — HEPARIN (PORCINE) 25000 UT/250ML-% IV SOLN: 850.0000 [IU]/h | INTRAVENOUS | Status: DC

## 2021-01-06 MED ORDER — SODIUM CHLORIDE 0.9% IV SOLUTION: Freq: Once | INTRAVENOUS | Status: AC

## 2021-01-06 MED ORDER — SODIUM CHLORIDE 0.9 % IV SOLN
2.0000 g | Freq: Two times a day (BID) | INTRAVENOUS | Status: DC
  Administered 2021-01-06 – 2021-01-11 (×10): 2 g via INTRAVENOUS
  Filled 2021-01-06 (×12): qty 2

## 2021-01-06 MED ORDER — SODIUM CHLORIDE 0.9 % IV SOLN
2.0000 g | Freq: Two times a day (BID) | INTRAVENOUS | Status: DC
  Administered 2021-01-06: 2 g via INTRAVENOUS
  Filled 2021-01-06 (×2): qty 2

## 2021-01-06 NOTE — Progress Notes (Signed)
Name: Cynthia Wallace MRN: [id number] DOB: [birth year]     CONSULTATION DATE: 12/18/2020  REFERRING MD :  Starla Link  CHIEF COMPLAINT:  Hypoxic respiratory failure  Patient background: This is an 85 y.o. female with multiple medical comorbidities that include IgG myeloma, chronic anemia, chronic systolic CHF(unknwn LVEF) on chronic diuretics, atrial fibrillation on chronic anticoagulation, chronic pulmonary hypertension,CKD stage III, PPM, hypertension and hyperlipidemia.  Presented on 31 December 2020 with COVID-19 pneumonia and progressed to acute respiratory failure with hypoxia.   Hospital Course thus far: CT-PA on 01/02/2021 showed no evidence of PE. Patient treated for COVID 19 pneumonia and superimposed bacterial pneumonia. Was started on 6L HFNC on 01/02/2021 and then transitioned to 50L HFNC this morning. Transferred to ICU as stepdown and started on BPAP. Patient continued to desaturate with any movement. Had to bump up optiflow to 100%. Patient remains full code. Had extensive discussion with patient and daughter. Patient understands that prognosis likely remains poor. But she would like to do a trial of intubation and mechanical ventilation. Intubated the evening of 01/03/2021.   Scheduled Meds: . vitamin C  250 mg Per Tube Daily  . aspirin  81 mg Per Tube Daily  . atorvastatin  10 mg Per Tube Daily  . chlorhexidine gluconate (MEDLINE KIT)  15 mL Mouth Rinse BID  . Chlorhexidine Gluconate Cloth  6 each Topical Daily  . docusate  100 mg Per Tube BID  . feeding supplement (PROSource TF)  45 mL Per Tube TID  . free water  100 mL Per Tube Q4H  . insulin aspart  0-15 Units Subcutaneous Q4H  . mouth rinse  15 mL Mouth Rinse 10 times per day  . methylPREDNISolone (SOLU-MEDROL) injection  60 mg Intravenous Q12H  . montelukast  10 mg Per Tube QHS  . pantoprazole sodium  40 mg Per Tube Daily  . polyethylene glycol  17 g Per Tube Daily  . traZODone  50 mg Per Tube QHS   Continuous  Infusions: . sodium chloride 250 mL (01/03/21 1835)  . ceFEPime (MAXIPIME) IV 2 g (01/06/21 1538)  . dexmedetomidine (PRECEDEX) IV infusion Stopped (01/04/21 1040)  . feeding supplement (VITAL AF 1.2 CAL) 1,000 mL (01/06/21 0200)  . fentaNYL infusion INTRAVENOUS 100 mcg/hr (01/06/21 1800)  . midazolam 4 mg/hr (01/06/21 1800)  . norepinephrine (LEVOPHED) Adult infusion Stopped (01/06/21 0748)  . propofol (DIPRIVAN) infusion Stopped (01/03/21 1900)   PRN Meds:.acetaminophen, docusate sodium, fentaNYL, midazolam, polyethylene glycol, senna-docusate, traMADol   Allergies  Allergen Reactions  . Codeine Nausea Only  . Penicillin G Other (See Comments)    As a child   REVIEW OF SYSTEMS:   Unable to obtain due to critical illness    Estimated body mass index is 32.98 kg/m as calculated from the following:   Height as of this encounter: 5' 5" (1.651 m).   Weight as of this encounter: 89.9 kg.  VITAL SIGNS: Temp:  [96.4 F (35.8 C)-98.2 F (36.8 C)] 98.2 F (36.8 C) (01/30 0925) Pulse Rate:  [61-81] 71 (01/30 0925) Resp:  [15-43] 27 (01/30 0925) BP: (101-136)/(48-72) 106/49 (01/30 0925) SpO2:  [90 %-97 %] 97 % (01/30 0925) FiO2 (%):  [40 %] 40 % (01/30 0925) Weight:  [89.9 kg] 89.9 kg (01/30 0500)   I/O last 3 completed shifts: In: 2396.8 [I.V.:1492.6; NG/GT:904.2] Out: 2995 [Urine:2995] No intake/output data recorded.   SpO2: 97 % O2 Flow Rate (L/min): 0 L/min FiO2 (%): 40 %   Physical Examination:  Physical examination  is limited due to need for PPE/CAPR GENERAL: intubated and sedated, no ventilator asynchrony HEAD: Normocephalic, atraumatic.  EYES: Pupils equal, round, reactive to light.  No scleral icterus.  MOUTH: Orotracheally intubated, OG in place. NECK: Supple.  Trachea midline. PULMONARY: Coarse breath sounds, cannot appreciate any other adventitious sounds. CARDIOVASCULAR: Monitor shows sinus rhythm. GASTROINTESTINAL: Soft, nontender, nondistended.    MUSCULOSKELETAL: No swelling, clubbing, or edema.  NEUROLOGIC: obtunded SKIN:intact,warm,dry  Chest x-ray performed today (1/30), independently reviewed, multifocal pneumonia with slight increase in aeration, support tubes and lines in good position.     CULTURE RESULTS   Recent Results (from the past 240 hour(s))  SARS Coronavirus 2 by RT PCR (hospital order, performed in [facility] hospital lab) Nasopharyngeal Nasopharyngeal Swab     Status: Abnormal   Collection Time: 12/15/2020  9:08 PM   Specimen: Nasopharyngeal Swab  Result Value Ref Range Status   SARS Coronavirus 2 POSITIVE (A) NEGATIVE Final    Comment: RESULT CALLED TO, READ BACK BY AND VERIFIED WITH: NOAH CRIFFINTH AT 2233 ON 01/05/2021 BY SS (NOTE) SARS-CoV-2 target nucleic acids are DETECTED  SARS-CoV-2 RNA is generally detectable in upper respiratory specimens  during the acute phase of infection.  Positive results are indicative  of the presence of the identified virus, but do not rule out bacterial infection or co-infection with other pathogens not detected by the test.  Clinical correlation with patient history and  other diagnostic information is necessary to determine patient infection status.  The expected result is negative.  Fact Sheet for Patients:   StrictlyIdeas.no   Fact Sheet for Healthcare Providers:   BankingDealers.co.za    This test is not yet approved or cleared by the [place] FDA and  has been authorized for detection and/or diagnosis of SARS-CoV-2 by FDA under an Emergency Use Authorization (EUA).  This EUA will remain in effect (meaning th is test can be used) for the duration of  the COVID-19 declaration under Section 564(b)(1) of the Act, 21 U.S.C. section 360-bbb-3(b)(1), unless the authorization is terminated or revoked sooner.  Performed at [facility], [address], [place], [place] [address]   Urine culture      Status: Abnormal   Collection Time: 12/21/2020  9:08 PM   Specimen: Urine, Random  Result Value Ref Range Status   Specimen Description   Final    URINE, RANDOM Performed at [facility], [address] [place], [place] [address]    Special Requests   Final    NONE Performed at [facility], [address] [place], [place] [address]    Culture (A)  Final    <10,000 COLONIES/mL INSIGNIFICANT GROWTH Performed at [facility], [place] [address], [place], [place] [address]    Report Status 01/02/2021 FINAL  Final  Blood culture (routine x 2)     Status: None   Collection Time: 01/03/2021 10:50 PM   Specimen: BLOOD  Result Value Ref Range Status   Specimen Description BLOOD RIGHT ASSIST CONTROL  Final   Special Requests   Final    BOTTLES DRAWN AEROBIC AND ANAEROBIC Blood Culture adequate volume   Culture   Final    NO GROWTH 5 DAYS Performed at [facility], [address], [place], [place] [address]    Report Status 01/05/2021 FINAL  Final  Blood culture (routine x 2)     Status: None   Collection Time: 12/09/2020 10:51 PM   Specimen: BLOOD  Result Value Ref  Range Status   Specimen Description BLOOD LEFT ASSIST CONTROL  Final   Special Requests   Final    BOTTLES DRAWN AEROBIC AND ANAEROBIC Blood Culture adequate volume   Culture   Final    NO GROWTH 5 DAYS Performed at [facility], [address], [place], [place] [address]    Report Status 01/05/2021 FINAL  Final  Culture, respiratory (non-expectorated)     Status: None (Preliminary result)   Collection Time: 01/04/21 11:42 AM   Specimen: Tracheal Aspirate; Respiratory  Result Value Ref Range Status   Specimen Description   Final    TRACHEAL ASPIRATE Performed at [facility], [address] [place], [place] [address]    Special Requests   Final    Normal Performed at [facility], [address] [place], [place] [address]    Gram Stain   Final    RARE  WBC PRESENT,BOTH PMN AND MONONUCLEAR MODERATE YEAST FEW GRAM NEGATIVE RODS    Culture   Final    RARE PSEUDOMONAS AERUGINOSA CULTURE REINCUBATED FOR BETTER GROWTH Performed at [facility], [place] [address], [place], [place] [address]    Report Status PENDING  Incomplete   Organism ID, Bacteria PSEUDOMONAS AERUGINOSA  Final      Susceptibility   Pseudomonas aeruginosa - MIC*    CEFTAZIDIME <=1 SENSITIVE Sensitive     CIPROFLOXACIN 0.5 SENSITIVE Sensitive     GENTAMICIN 2 SENSITIVE Sensitive     IMIPENEM <=0.25 SENSITIVE Sensitive     PIP/TAZO <=4 SENSITIVE Sensitive     CEFEPIME 2 SENSITIVE Sensitive     * RARE PSEUDOMONAS AERUGINOSA   Results for orders placed or performed during the hospital encounter of 12/17/2020 (from the past 24 hour(s))  Glucose, capillary     Status: Abnormal   Collection Time: 01/05/21  8:24 PM  Result Value Ref Range   Glucose-Capillary 149 (H) 70 - 99 mg/dL  Glucose, capillary     Status: Abnormal   Collection Time: 01/06/21 12:44 AM  Result Value Ref Range   Glucose-Capillary 148 (H) 70 - 99 mg/dL  Ferritin     Status: Abnormal   Collection Time: 01/06/21  4:28 AM  Result Value Ref Range   Ferritin 1,030 (H) 11 - 307 ng/mL  Fibrin derivatives D-Dimer ([facility] only)     Status: Abnormal   Collection Time: 01/06/21  4:28 AM  Result Value Ref Range   Fibrin derivatives D-dimer ([facility]) 2,351.83 (H) 0.00 - 499.00 ng/mL (FEU)  APTT     Status: Abnormal   Collection Time: 01/06/21  4:28 AM  Result Value Ref Range   aPTT 143 (H) 24 - 36 seconds  Heparin level (unfractionated)     Status: Abnormal   Collection Time: 01/06/21  4:28 AM  Result Value Ref Range   Heparin Unfractionated 0.78 (H) 0.30 - 0.70 IU/mL  CBC     Status: Abnormal   Collection Time: 01/06/21  4:28 AM  Result Value Ref Range   WBC 1.9 (L) 4.0 - 10.5 K/uL   RBC 2.11 (L) 3.87 - 5.11 MIL/uL   Hemoglobin 6.8 (L) 12.0 - 15.0 g/dL   HCT 21.7 (L) 36.0 - 46.0 %   MCV 102.8 (H) 80.0 -  100.0 fL   MCH 32.2 26.0 - 34.0 pg   MCHC 31.3 30.0 - 36.0 g/dL   RDW 16.7 (H) 11.5 - 15.5 %   Platelets 130 (L) 150 - 400 K/uL   nRBC 10.1 (H)  0.0 - 0.2 %  Basic metabolic panel     Status: Abnormal   Collection Time: 01/06/21  4:28 AM  Result Value Ref Range   Sodium 150 (H) 135 - 145 mmol/L   Potassium 4.4 3.5 - 5.1 mmol/L   Chloride 116 (H) 98 - 111 mmol/L   CO2 27 22 - 32 mmol/L   Glucose, Bld 158 (H) 70 - 99 mg/dL   BUN 86 (H) 8 - 23 mg/dL   Creatinine, Ser 1.46 (H) 0.44 - 1.00 mg/dL   Calcium 8.0 (L) 8.9 - 10.3 mg/dL   GFR, Estimated 36 (L) >60 mL/min   Anion gap 7 5 - 15  Magnesium     Status: Abnormal   Collection Time: 01/06/21  4:28 AM  Result Value Ref Range   Magnesium 3.2 (H) 1.7 - 2.4 mg/dL  Phosphorus     Status: None   Collection Time: 01/06/21  4:28 AM  Result Value Ref Range   Phosphorus 2.7 2.5 - 4.6 mg/dL  Glucose, capillary     Status: Abnormal   Collection Time: 01/06/21  5:22 AM  Result Value Ref Range   Glucose-Capillary 154 (H) 70 - 99 mg/dL  Prepare RBC (crossmatch)     Status: None   Collection Time: 01/06/21  5:55 AM  Result Value Ref Range   Order Confirmation      ORDER PROCESSED BY BLOOD BANK Performed at [facility], [address] [place], [place] [address]   Type and screen [place]     Status: None (Preliminary result)   Collection Time: 01/06/21  6:23 AM  Result Value Ref Range   ABO/RH(D) O POS    Antibody Screen NEG    Sample Expiration 01/09/2021,2359    Unit Number [id number]    Blood Component Type RBC LR PHER1    Unit division 00    Status of Unit ISSUED    Transfusion Status OK TO TRANSFUSE    Crossmatch Result      Compatible Performed at [facility], [address] [place], [place] [address]   Glucose, capillary     Status: Abnormal   Collection Time: 01/06/21  7:34 AM  Result Value Ref Range   Glucose-Capillary 149 (H) 70 - 99 mg/dL  Hemoglobin and hematocrit,  blood     Status: Abnormal   Collection Time: 01/06/21  2:10 PM  Result Value Ref Range   Hemoglobin 7.5 (L) 12.0 - 15.0 g/dL   HCT 23.5 (L) 36.0 - 46.0 %  Glucose, capillary     Status: Abnormal   Collection Time: 01/06/21  3:38 PM  Result Value Ref Range   Glucose-Capillary 170 (H) 70 - 99 mg/dL        IMAGING    DG Chest Port 1 View  Result Date: 01/06/2021 CLINICAL DATA:  Acute respiratory failure, asthma, CHF, hypertension, CAD. EXAM: PORTABLE CHEST 1 VIEW COMPARISON:  Chest x-ray 01/03/2021 FINDINGS: Endotracheal tube with tip 3 cm above the carina. Enteric tube coursing below the hemidiaphragm with tip and side port collimated off view. Right internal jugular venous catheter with tip overlying the right atrium. Left chest wall 2 lead cardiac pacemaker in similar position. Cardiomegaly. The heart size and mediastinal contours are unchanged. Aortic arch calcification. Persistent diffuse patchy airspace opacities with slight improved aeration of the lungs. No pulmonary edema. No pleural effusion. No pneumothorax. No acute osseous abnormality. IMPRESSION: 1. Multifocal pneumonia with slightly improved aeration of the lungs. 2. Lines  and tubes are stable. Electronically Signed   By: Iven Finn M.D.   On: 01/06/2021 05:38     Nutrition Status: Nutrition Problem: Inadequate oral intake Etiology: inability to eat (pt sedated and ventilated) Signs/Symptoms: NPO status    Indwelling Urinary Catheter continued, requirement due to   Reason to continue Indwelling Urinary Catheter strict Intake/Output monitoring for hemodynamic instability   Central Line/ continued, requirement due to  Reason to continue [place] of central venous pressure or other hemodynamic parameters and poor IV access   Ventilator continued, requirement due to severe respiratory failure   Ventilator Sedation RASS -2 to -3   ASSESSMENT AND PLAN SYNOPSIS 85 y.o. female with Covid-19 pneumonia  with superimposed bacterial infection, intubated on 1/27 for worsening respiratory failure.  PULMONARY Severe ACUTE Hypoxic and Hypercapnic Respiratory Failure Covid-19 pneumonia Pseudomonas in tracheal aspirate -continue Full MV support with lung protective strategies -continue Bronchodilator Therapy -Wean Fio2 and PEEP as tolerated -Vent weaning protocol -VAP/VENT bundle implementation -Continue steroids, remdesivir for COVID pneumonia -Not a candidate for baricitinib given ongoing care and chemotherapy for myeloma -Palliative following along since admission to hospitalist team -Add Maxipime for coverage of Pseudomonas  RENAL Net -1.9L for stay -Cr currently 1.46. Likely background CKD, seems stable -continue Foley Catheter, assess need daily -Avoid nephrotoxic agents -Follow urine output, BMP, Na rising -Ensure adequate renal perfusion, optimize oxygenation -Renal dose medications -Hypernatremia, increase free water   NEUROLOGY -intubated and sedated -minimal sedation to achieve a RASS goal: -2 to -3 -transitioned from Precedex to Versed gtt given poor responsiveness to Precedex and hypotension with propofol -continue fentanyl gtt  CARDIAC SHOCK ACUTE on CHRONIC DIASTOLIC CARDIAC FAILURE- EF 65% Chronic Afib -pressors as needed for MAP goal >65 -hypotension improved as sedation is weaned -Hold Lasix given pressor requirement -Echo showed EF of 65-70% -Fluid restriction -continue to monitor on continuous telemetry -currently rate controlled -transitioned from Xarelto to heparin gtt  HEME Chronic anemia IgG Myeloma Pancytopenia -currently undergoing chemotherapy -platelets improved, -leukopenia persists -Transfuse <7 HgB, receiving 1 unit of blood today   ID -Tracheal aspirate grew Pseudomonas, switched to Maxipime  GI GI PROPHYLAXIS  NUTRITIONAL STATUS DIET-->TF Constipation protocol as indicated  ENDO - will use ICU hypoglycemic\Hyperglycemia  protocol if needed  ELECTROLYTES -follow labs as needed, Na climbing -replace as needed -pharmacy consultation and following  DVT/GI PRX ordered and assessed TRANSFUSIONS AS NEEDED MONITOR FSBS I Assessed the need for Labs I Assessed the need for Foley I Assessed the need for Central Venous Line Family Discussion when available I Assessed the need for Mobilization I made an Assessment of medications to be adjusted accordingly Safety Risk assessment Completed     Critical Care Time devoted to patient care services described in this note is 40 minutes.   Overall, patient is critically ill, prognosis is guarded.  Patient with multiorgan failure and at high risk for cardiac arrest and death.   Renold Don, MD [facility]  01/06/21 10:34 AM    *This note was dictated using voice recognition software/Dragon.  Despite best efforts to proofread, errors can occur which can change the meaning.  Any change was purely unintentional.

## 2021-01-06 NOTE — Progress Notes (Addendum)
Pharmacy Antibiotic Note  Cynthia Wallace is a 85 y.o. female admitted on 01/07/2021 with HCAP.  Pharmacy has been consulted for Cefepime dosing.  Respiratory culture shows pseudomonas.  Susceptibilities pending.   Plan: Ordered Cefepime 2gm q12h per indication and CrCl.   Pharmacy will monitor for susceptibilities and continue to follow SCr and adjust dose if warranted.   Height: 5\' 5"  (165.1 cm) Weight: 89.9 kg (198 lb 3.1 oz) IBW/kg (Calculated) : 57  Temp (24hrs), Avg:96.8 F (36 C), Min:96.4 F (35.8 C), Max:97.6 F (36.4 C)  Recent Labs  Lab 01/01/21 0115 01/02/21 0602 01/03/21 0532 01/04/21 0315 01/05/21 0436  WBC 5.4 3.8* 5.0 7.6 3.0*  CREATININE 1.51* 1.21* 1.31* 1.49* 1.45*    Estimated Creatinine Clearance: 33.2 mL/min (A) (by C-G formula based on SCr of 1.45 mg/dL (H)).    Allergies  Allergen Reactions  . Codeine Nausea Only  . Penicillin G Other (See Comments)    As a child    Antimicrobials this admission: 1/25 Ceftriaxone >> 1/29 1/30 Cefepime >>    Microbiology results: 01/24 BCx: No growth 5 days 01/24 UCx: Abnormal - insignificant growth  01/24 Respiratory : Moderate yeast & Pseudomonas    Thank you for allowing pharmacy to be a part of this patient's care.  Renda Rolls, PharmD, [facility] 01/06/2021 3:31 AM

## 2021-01-06 NOTE — Consult Note (Signed)
[facility] for Heparin Infusion Indication: atrial fibrillation  Patient Measurements: Heparin Dosing Weight: 77.1 kg  Labs: Recent Labs    01/04/21 0315 01/04/21 0315 01/04/21 0927 01/05/21 0436 01/05/21 1233 01/06/21 0428  HGB 8.2*  --   --  7.3*  --  6.8*  HCT 27.6*  --   --  23.9*  --  21.7*  PLT 181  --   --  151  --  130*  APTT  --    < > 33 71* 92* 143*  LABPROT  --   --  25.4*  --   --   --   INR  --   --  2.4*  --   --   --   HEPARINUNFRC  --   --  2.30* 1.10*  --  0.78*  CREATININE 1.49*  --   --  1.45*  --  1.46*   < > = values in this interval not displayed.    Estimated Creatinine Clearance: 32.9 mL/min (A) (by C-G formula based on SCr of 1.46 mg/dL (H)).   Medical History: Past Medical History:  Diagnosis Date  . Acid reflux `  . Adenomatous colon polyp   . Anemia   . Arthritis   . Asthma   . Atrial fibrillation ([place])   . Bacteremia   . Bilateral leg edema   . Bilateral leg edema   . Bronchitis   . Cardiomyopathy ([place])   . CHF (congestive heart failure) ([place])   . Chickenpox   . Chronic a-fib ([place])   . Chronic atrial fibrillation ([place])   . Chronic pulmonary hypertension ([place])   . Coronary artery disease   . Dyspnea on exertion   . Fibrocystic breast disease   . Hyperlipidemia   . Hypertension   . Iron deficiency anemia   . Localized edema   . MGUS (monoclonal gammopathy of unknown significance)   . Neuropathy   . Osteoporosis   . Pelvic pain   . Pneumonia   . PUD (peptic ulcer disease)   . Severe tricuspid valve insufficiency   . Sick sinus syndrome ([place])   . Sleep apnea   . Tricuspid valve insufficiency   . Venous insufficiency of both lower extremities     Medications:  Xarelto 15mg  daily (last dose 1/27 at 1858)  Assessment:  85 yo female with multiple medical comorbidities that include IgG myeloma, chronic anemia, chronic systolic CHF on chronic diuretics, atrial fibrillation on chronic  anticoagulation, chronic pulmonary hypertension, CKD Stage III, hypertension and hyperlipidemia. Pt was intubated the evening of 1/27 and placed on mechanical ventilation.   Pharmacy was consulted to switch Xarelto to IV Heparin infusion.   Baseline HL is 2.3, aPTT is 33. Platelets 70>104>181 H/H are stable, and platelets have stabilized.   Goal of Therapy:  APTT goal 66-102  Monitor platelets by anticoagulation protocol: Yes   1/29 0436 aPTT 71, therapeutic, HL 1.10 1/29 1233 aPTT 92, therapeutic x 2 1/30 0555 aPTT 143, HL 0.78, supratherapeutic    Plan:  Careers information officer.  Hold Infusion for 1 hour. Decrease heparin infusion to 850 units/hr. Since aPTT and HL are beginning to correlate, will recheck HL and aPTT in 8 hours. Monitor daily CBC, and s/s of bleeding.   Renda Rolls, PharmD, [facility] 01/06/2021 6:17 AM

## 2021-01-07 DIAGNOSIS — I50813 Acute on chronic right heart failure: Secondary | ICD-10-CM

## 2021-01-07 DIAGNOSIS — U071 COVID-19: Secondary | ICD-10-CM | POA: Diagnosis not present

## 2021-01-07 DIAGNOSIS — Z515 Encounter for palliative care: Secondary | ICD-10-CM | POA: Diagnosis not present

## 2021-01-07 DIAGNOSIS — I5033 Acute on chronic diastolic (congestive) heart failure: Secondary | ICD-10-CM | POA: Diagnosis not present

## 2021-01-07 DIAGNOSIS — J1282 Pneumonia due to coronavirus disease 2019: Secondary | ICD-10-CM | POA: Diagnosis not present

## 2021-01-07 LAB — CBC WITH DIFFERENTIAL/PLATELET
Abs Immature Granulocytes: 0.26 10*3/uL — ABNORMAL HIGH (ref 0.00–0.07)
Basophils Absolute: 0 10*3/uL (ref 0.0–0.1)
Basophils Relative: 1 %
Eosinophils Absolute: 0 10*3/uL (ref 0.0–0.5)
Eosinophils Relative: 0 %
HCT: 24.5 % — ABNORMAL LOW (ref 36.0–46.0)
Hemoglobin: 8 g/dL — ABNORMAL LOW (ref 12.0–15.0)
Immature Granulocytes: 11 %
Lymphocytes Relative: 23 %
Lymphs Abs: 0.6 10*3/uL — ABNORMAL LOW (ref 0.7–4.0)
MCH: 32.7 pg (ref 26.0–34.0)
MCHC: 32.7 g/dL (ref 30.0–36.0)
MCV: 100 fL (ref 80.0–100.0)
Monocytes Absolute: 0.4 10*3/uL (ref 0.1–1.0)
Monocytes Relative: 18 %
Neutro Abs: 1.2 10*3/uL — ABNORMAL LOW (ref 1.7–7.7)
Neutrophils Relative %: 47 %
Platelets: 122 10*3/uL — ABNORMAL LOW (ref 150–400)
RBC: 2.45 MIL/uL — ABNORMAL LOW (ref 3.87–5.11)
RDW: 19.3 % — ABNORMAL HIGH (ref 11.5–15.5)
Smear Review: NORMAL
WBC: 2.4 10*3/uL — ABNORMAL LOW (ref 4.0–10.5)
nRBC: 10.2 % — ABNORMAL HIGH (ref 0.0–0.2)

## 2021-01-07 LAB — GLUCOSE, CAPILLARY
Glucose-Capillary: 115 mg/dL — ABNORMAL HIGH (ref 70–99)
Glucose-Capillary: 148 mg/dL — ABNORMAL HIGH (ref 70–99)
Glucose-Capillary: 156 mg/dL — ABNORMAL HIGH (ref 70–99)
Glucose-Capillary: 160 mg/dL — ABNORMAL HIGH (ref 70–99)
Glucose-Capillary: 173 mg/dL — ABNORMAL HIGH (ref 70–99)
Glucose-Capillary: 190 mg/dL — ABNORMAL HIGH (ref 70–99)
Glucose-Capillary: 213 mg/dL — ABNORMAL HIGH (ref 70–99)
Glucose-Capillary: 228 mg/dL — ABNORMAL HIGH (ref 70–99)

## 2021-01-07 LAB — CULTURE, RESPIRATORY W GRAM STAIN: Special Requests: NORMAL

## 2021-01-07 LAB — TYPE AND SCREEN
ABO/RH(D): O POS
Antibody Screen: NEGATIVE
Unit division: 0

## 2021-01-07 LAB — MAGNESIUM: Magnesium: 3.3 mg/dL — ABNORMAL HIGH (ref 1.7–2.4)

## 2021-01-07 LAB — BPAM RBC
Blood Product Expiration Date: 202202262359
ISSUE DATE / TIME: 202201300903
Unit Type and Rh: 5100

## 2021-01-07 LAB — BASIC METABOLIC PANEL
Anion gap: 9 (ref 5–15)
BUN: 84 mg/dL — ABNORMAL HIGH (ref 8–23)
CO2: 28 mmol/L (ref 22–32)
Calcium: 8.6 mg/dL — ABNORMAL LOW (ref 8.9–10.3)
Chloride: 116 mmol/L — ABNORMAL HIGH (ref 98–111)
Creatinine, Ser: 1.32 mg/dL — ABNORMAL HIGH (ref 0.44–1.00)
GFR, Estimated: 40 mL/min — ABNORMAL LOW (ref >60)
Glucose, Bld: 178 mg/dL — ABNORMAL HIGH (ref 70–99)
Potassium: 4.7 mmol/L (ref 3.5–5.1)
Sodium: 153 mmol/L — ABNORMAL HIGH (ref 135–145)

## 2021-01-07 LAB — PATHOLOGIST SMEAR REVIEW

## 2021-01-07 LAB — PHOSPHORUS: Phosphorus: 3.2 mg/dL (ref 2.5–4.6)

## 2021-01-07 LAB — FERRITIN: Ferritin: 1006 ng/mL — ABNORMAL HIGH (ref 11–307)

## 2021-01-07 LAB — FIBRIN DERIVATIVES D-DIMER (ARMC ONLY): Fibrin derivatives D-dimer (ARMC): 2976.44 ng/mL (FEU) — ABNORMAL HIGH (ref 0.00–499.00)

## 2021-01-07 MED ORDER — MIDAZOLAM HCL 2 MG/2ML IJ SOLN
INTRAMUSCULAR | Status: AC
  Filled 2021-01-07: qty 4

## 2021-01-07 MED ORDER — MIDAZOLAM HCL 2 MG/2ML IJ SOLN
2.0000 mg | INTRAMUSCULAR | Status: DC | PRN
  Administered 2021-01-09: 4 mg via INTRAVENOUS

## 2021-01-07 MED ORDER — VECURONIUM BROMIDE 10 MG IV SOLR: 10.0000 mg | Freq: Once | INTRAVENOUS | Status: AC

## 2021-01-07 MED ORDER — PROPOFOL 1000 MG/100ML IV EMUL
5.0000 ug/kg/min | INTRAVENOUS | Status: DC
  Administered 2021-01-07 – 2021-01-09 (×4): 20 ug/kg/min via INTRAVENOUS
  Administered 2021-01-09 – 2021-01-10 (×7): 30 ug/kg/min via INTRAVENOUS
  Administered 2021-01-11: 25 ug/kg/min via INTRAVENOUS
  Administered 2021-01-11: 30 ug/kg/min via INTRAVENOUS
  Filled 2021-01-07 (×15): qty 100

## 2021-01-07 MED ORDER — VECURONIUM BROMIDE 10 MG IV SOLR
INTRAVENOUS | Status: AC
  Administered 2021-01-07: 10 mg
  Filled 2021-01-07: qty 10

## 2021-01-07 MED ORDER — MIDAZOLAM HCL 2 MG/2ML IJ SOLN
4.0000 mg | Freq: Once | INTRAMUSCULAR | Status: AC
  Administered 2021-01-07: 4 mg via INTRAVENOUS

## 2021-01-07 NOTE — Progress Notes (Signed)
CRITICAL CARE NOTE  85 y.o.femalewith multiple medical comorbidities that include IgG myeloma, chronic anemia, chronic systolic CHF(unknwn LVEF)on chronic diuretics, atrial fibrillation on chronic anticoagulation,chronic pulmonary hypertension,CKD stage III, PPM,hypertension and hyperlipidemia. Chest x-ray was concerning for bilateral heterogeneous space opacities.  Transferred to ICU as stepdown and started on BPAP. Patient continued to desaturate with any movement. Had to bump up optiflow to 100%. Patient remains full code. Had extensive discussion with patient and daughter. Patient understands that prognosis likely remains poor. But she would like to do a trial of intubation and mechanical ventilation.   1/27 admitted to ICU s/p intubation 1/31 failure to wean severe hypoxia    CC  follow up respiratory failure  SUBJECTIVE Patient remains critically ill Prognosis is guarded   Vent Mode: PRVC FiO2 (%):  [40 %] 40 % Set Rate:  [24 bmp] 24 bmp Vt Set:  [400 mL] 400 mL PEEP:  [5 cmH20-10 cmH20] 5 cmH20 Plateau Pressure:  [14 cmH20] 14 cmH20  CBC    Component Value Date/Time   WBC 2.4 (L) 01/07/2021 0521   RBC 2.45 (L) 01/07/2021 0521   HGB 8.0 (L) 01/07/2021 0521   HGB 11.9 (L) 03/05/2015 1116   HCT 24.5 (L) 01/07/2021 0521   HCT 36.6 03/05/2015 1116   PLT 122 (L) 01/07/2021 0521   PLT 154 03/05/2015 1116   MCV 100.0 01/07/2021 0521   MCV 96 03/05/2015 1116   MCH 32.7 01/07/2021 0521   MCHC 32.7 01/07/2021 0521   RDW 19.3 (H) 01/07/2021 0521   RDW 15.0 (H) 03/05/2015 1116   LYMPHSABS 0.6 (L) 01/07/2021 0521   LYMPHSABS 1.6 03/05/2015 1116   MONOABS 0.4 01/07/2021 0521   MONOABS 0.6 03/05/2015 1116   EOSABS 0.0 01/07/2021 0521   EOSABS 0.1 03/05/2015 1116   BASOSABS 0.0 01/07/2021 0521   BASOSABS 0.0 03/05/2015 1116   BMP Latest Ref Rng & Units 01/07/2021 01/06/2021 01/05/2021  Glucose 70 - 99 mg/dL 178(H) 158(H) 174(H)  BUN 8 - 23 mg/dL 84(H) 86(H) 72(H)   Creatinine 0.44 - 1.00 mg/dL 1.32(H) 1.46(H) 1.45(H)  Sodium 135 - 145 mmol/L 153(H) 150(H) 147(H)  Potassium 3.5 - 5.1 mmol/L 4.7 4.4 4.2  Chloride 98 - 111 mmol/L 116(H) 116(H) 112(H)  CO2 22 - 32 mmol/L 28 27 25   Calcium 8.9 - 10.3 mg/dL 8.6(L) 8.0(L) 7.6(L)     BP (!) 116/51   Pulse 70   Temp 98.6 F (37 C) (Axillary)   Resp (!) 21   Ht 5' 5"  (1.651 m)   Wt 89.9 kg   SpO2 96%   BMI 32.98 kg/m    I/O last 3 completed shifts: In: 2009 [I.V.:586.4; Blood:341.7; NG/GT:1081] Out: 2200 [Urine:2200] No intake/output data recorded.  SpO2: 96 % O2 Flow Rate (L/min): 0 L/min FiO2 (%): 40 %  Estimated body mass index is 32.98 kg/m as calculated from the following:   Height as of this encounter: 5' 5"  (1.651 m).   Weight as of this encounter: 89.9 kg.  SIGNIFICANT EVENTS   REVIEW OF SYSTEMS  PATIENT IS UNABLE TO PROVIDE COMPLETE REVIEW OF SYSTEMS DUE TO SEVERE CRITICAL ILLNESS       PHYSICAL EXAMINATION: GENERAL:critically ill appearing, +resp distress NECK: Supple.  PULMONARY: +rhonchi, +wheezing CARDIOVASCULAR: S1 and S2.  GASTROINTESTINAL: Soft, nontender, +Positive bowel sounds.  MUSCULOSKELETAL: No swelling, clubbing, or edema.  NEUROLOGIC: obtunded, GCS<8 SKIN:intact,warm,dry    Patient assessed or the symptoms described in the history of present illness.  In the context of the  Global COVID-19 pandemic, which necessitated consideration that the patient might be at risk for infection with the SARS-CoV-2 virus that causes COVID-19, Institutional protocols and algorithms that pertain to the evaluation of patients at risk for COVID-19 are in a state of rapid change based on information released by regulatory bodies including the CDC and federal and state organizations. These policies and algorithms were followed during the patient's care while in hospital.    MEDICATIONS: I have reviewed all medications and confirmed regimen as documented   CULTURE RESULTS    Recent Results (from the past 240 hour(s))  SARS Coronavirus 2 by RT PCR (hospital order, performed in [facility] hospital lab) Nasopharyngeal Nasopharyngeal Swab     Status: Abnormal   Collection Time: 12/23/2020  9:08 PM   Specimen: Nasopharyngeal Swab  Result Value Ref Range Status   SARS Coronavirus 2 POSITIVE (A) NEGATIVE Final    Comment: RESULT CALLED TO, READ BACK BY AND VERIFIED WITH: NOAH CRIFFINTH AT 2233 ON 12/16/2020 BY SS (NOTE) SARS-CoV-2 target nucleic acids are DETECTED  SARS-CoV-2 RNA is generally detectable in upper respiratory specimens  during the acute phase of infection.  Positive results are indicative  of the presence of the identified virus, but do not rule out bacterial infection or co-infection with other pathogens not detected by the test.  Clinical correlation with patient history and  other diagnostic information is necessary to determine patient infection status.  The expected result is negative.  Fact Sheet for Patients:   StrictlyIdeas.no   Fact Sheet for Healthcare Providers:   BankingDealers.co.za    This test is not yet approved or cleared by the [place] FDA and  has been authorized for detection and/or diagnosis of SARS-CoV-2 by FDA under an Emergency Use Authorization (EUA).  This EUA will remain in effect (meaning th is test can be used) for the duration of  the COVID-19 declaration under Section 564(b)(1) of the Act, 21 U.S.C. section 360-bbb-3(b)(1), unless the authorization is terminated or revoked sooner.  Performed at [facility], [address], [place], [place] [address]   Urine culture     Status: Abnormal   Collection Time: 12/28/2020  9:08 PM   Specimen: Urine, Random  Result Value Ref Range Status   Specimen Description   Final    URINE, RANDOM Performed at [facility], [address], [place], [place] [address]    Special Requests   Final     NONE Performed at [facility], [address] [place], [place] [address]    Culture (A)  Final    <10,000 COLONIES/mL INSIGNIFICANT GROWTH Performed at [facility], [place] [address], [place], [place] [address]    Report Status 01/02/2021 FINAL  Final  Blood culture (routine x 2)     Status: None   Collection Time: 01/05/2021 10:50 PM   Specimen: BLOOD  Result Value Ref Range Status   Specimen Description BLOOD RIGHT ASSIST CONTROL  Final   Special Requests   Final    BOTTLES DRAWN AEROBIC AND ANAEROBIC Blood Culture adequate volume   Culture   Final    NO GROWTH 5 DAYS Performed at [facility], [address], [place], [place] [address]    Report Status 01/05/2021 FINAL  Final  Blood culture (routine x 2)     Status: None   Collection Time: 12/08/2020 10:51 PM   Specimen: BLOOD  Result Value Ref Range Status   Specimen Description BLOOD LEFT ASSIST CONTROL  Final   Special Requests   Final    BOTTLES DRAWN AEROBIC AND ANAEROBIC Blood Culture adequate volume   Culture   Final    NO GROWTH 5 DAYS Performed at [facility], [address] [place], [place] [address]    Report Status 01/05/2021 FINAL  Final  Culture, respiratory (non-expectorated)     Status: None (Preliminary result)   Collection Time: 01/04/21 11:42 AM   Specimen: Tracheal Aspirate; Respiratory  Result Value Ref Range Status   Specimen Description   Final    TRACHEAL ASPIRATE Performed at [facility], [address], [place], [place] [address]    Special Requests   Final    Normal Performed at [facility], [address] [place], [place] [address]    Gram Stain   Final    RARE WBC PRESENT,BOTH PMN AND MONONUCLEAR MODERATE YEAST FEW GRAM NEGATIVE RODS    Culture   Final    RARE PSEUDOMONAS AERUGINOSA CULTURE REINCUBATED FOR BETTER GROWTH Performed at [facility], [place] [address] [place], [place] [address]    Report Status PENDING   Incomplete   Organism ID, Bacteria PSEUDOMONAS AERUGINOSA  Final      Susceptibility   Pseudomonas aeruginosa - MIC*    CEFTAZIDIME <=1 SENSITIVE Sensitive     CIPROFLOXACIN 0.5 SENSITIVE Sensitive     GENTAMICIN 2 SENSITIVE Sensitive     IMIPENEM <=0.25 SENSITIVE Sensitive     PIP/TAZO <=4 SENSITIVE Sensitive     CEFEPIME 2 SENSITIVE Sensitive     * RARE PSEUDOMONAS AERUGINOSA          IMAGING    No results found.   Nutrition Status: Nutrition Problem: Inadequate oral intake Etiology: inability to eat (pt sedated and ventilated) Signs/Symptoms: NPO status       Indwelling Urinary Catheter continued, requirement due to   Reason to continue Indwelling Urinary Catheter strict Intake/Output monitoring for hemodynamic instability   Central Line/ continued, requirement due to  Reason to continue [place] of central venous pressure or other hemodynamic parameters and poor IV access   Ventilator continued, requirement due to severe respiratory failure   Ventilator Sedation RASS 0 to -2      ASSESSMENT AND PLAN SYNOPSIS  85 y.o. female with Covid-19 pneumonia with superimposed bacterial infection PSEUDOMONAL PNEUMONIA intubated on 1/27 for worsening respiratory failure. SEPSIS present on admission     Acute hypoxemic respiratory failure due to COVID-19 pneumonia / ARDS Mechanical ventilation via ARDS protocol, target PRVC 6 cc/kg Wean PEEP and FiO2 as able Goal plateau pressure less than 30, driving pressure less than 15 VAP prevention order set Remdesivir  IV STEROIDS   Severe ACUTE Hypoxic and Hypercapnic Respiratory Failure -continue Full MV support -continue Bronchodilator Therapy -Wean Fio2 and PEEP as tolerated -will perform SAT/SBT when respiratory parameters are met -VAP/VENT bundle implementation  ACUTE DIASTOLIC CARDIAC FAILURE-  -oxygen as needed -Lasix as tolerated  ACUTE KIDNEY INJURY/Renal Failure -continue Foley  Catheter-assess need -Avoid nephrotoxic agents -Follow urine output, BMP -Ensure adequate renal perfusion, optimize oxygenation -Renal dose medications    NEUROLOGY Acute toxic metabolic encephalopathy, need for sedation Goal RASS -2 to -3  CARDIAC ICU monitoring  ID -continue IV abx as prescibed -follow up cultures  GI GI PROPHYLAXIS as indicated   DIET-->TF's as tolerated Constipation protocol as indicated  ENDO - will use ICU hypoglycemic\Hyperglycemia protocol if indicated    ELECTROLYTES -follow labs as needed -replace as needed -pharmacy consultation  and following   DVT/GI PRX ordered and assessed TRANSFUSIONS AS NEEDED MONITOR FSBS I Assessed the need for Labs I Assessed the need for Foley I Assessed the need for Central Venous Line Family Discussion when available I Assessed the need for Mobilization I made an Assessment of medications to be adjusted accordingly Safety Risk assessment completed   CASE DISCUSSED IN MULTIDISCIPLINARY ROUNDS WITH ICU TEAM  Critical Care Time devoted to patient care services described in this note is 55 minutes.   Overall, patient is critically ill, prognosis is guarded.  Patient with Multiorgan failure and at high risk for cardiac arrest and death.    Corrin Parker, M.D.  [facility]  Medical Director [place] Director [facility] Cardio-Pulmonary Department

## 2021-01-07 NOTE — Progress Notes (Signed)
Patients daughter has called back and and has agreed and consented to DNR status.

## 2021-01-07 NOTE — Progress Notes (Signed)
GOALS OF CARE DISCUSSION  The Clinical status was relayed to family in detail. Daughter Lorie  Updated and notified of patients medical condition.  Patient remains unresponsive and will not open eyes to command.    Patient is having a weak cough and struggling to remove secretions.   Patient with increased WOB and using accessory muscles to breathe Explained to family course of therapy and the modalities     Patient with Progressive multiorgan failure with a very high probablity of a very minimal chance of meaningful recovery despite all aggressive and optimal medical therapy.   Process associated with Suffering.  Family understands the situation.  Remains full code now, they will have family meeting to discuss DNR  Family are satisfied with Plan of action and management. All questions answered  Additional CC time 32 mins   Dann Ventress Patricia Pesa, M.D.  [facility]  Medical Director [place] Director [facility] Cardio-Pulmonary Department

## 2021-01-08 DIAGNOSIS — I5023 Acute on chronic systolic (congestive) heart failure: Secondary | ICD-10-CM

## 2021-01-08 DIAGNOSIS — J9601 Acute respiratory failure with hypoxia: Secondary | ICD-10-CM | POA: Diagnosis not present

## 2021-01-08 DIAGNOSIS — Z7189 Other specified counseling: Secondary | ICD-10-CM

## 2021-01-08 DIAGNOSIS — Z515 Encounter for palliative care: Secondary | ICD-10-CM | POA: Diagnosis not present

## 2021-01-08 DIAGNOSIS — U071 COVID-19: Secondary | ICD-10-CM | POA: Diagnosis not present

## 2021-01-08 LAB — GLUCOSE, CAPILLARY
Glucose-Capillary: 152 mg/dL — ABNORMAL HIGH (ref 70–99)
Glucose-Capillary: 159 mg/dL — ABNORMAL HIGH (ref 70–99)
Glucose-Capillary: 168 mg/dL — ABNORMAL HIGH (ref 70–99)
Glucose-Capillary: 162 mg/dL — ABNORMAL HIGH (ref 70–99)
Glucose-Capillary: 178 mg/dL — ABNORMAL HIGH (ref 70–99)
Glucose-Capillary: 189 mg/dL — ABNORMAL HIGH (ref 70–99)

## 2021-01-08 LAB — FIBRIN DERIVATIVES D-DIMER (ARMC ONLY): Fibrin derivatives D-dimer (ARMC): 3784.28 ng/mL (FEU) — ABNORMAL HIGH (ref 0.00–499.00)

## 2021-01-08 LAB — TRIGLYCERIDES: Triglycerides: 82 mg/dL (ref <150)

## 2021-01-08 LAB — FERRITIN: Ferritin: 896 ng/mL — ABNORMAL HIGH (ref 11–307)

## 2021-01-08 NOTE — Plan of Care (Signed)
Pt overall had stable day. Per Dr.Kasa, no sedation awakening this am due to poor chest wall movement/compliance. Was able to wean off propofol. Increased fentanyl gtt slightly per Dr.Kasa recommendation to improve ventilator compliance. Pt tolerating tube feeds. Daughter, Cynthia Wallace, at bedside this evening. VSS.  Problem: Education: Goal: Knowledge of General Education information will improve Description: Including pain rating scale, medication(s)/side effects and non-pharmacologic comfort measures Outcome: Not Progressing   Problem: Health Behavior/Discharge Planning: Goal: Ability to manage health-related needs will improve Outcome: Not Progressing   Problem: Clinical Measurements: Goal: Ability to maintain clinical measurements within normal limits will improve Outcome: Not Progressing   Problem: Activity: Goal: Risk for activity intolerance will decrease Outcome: Not Progressing   Problem: Elimination: Goal: Will not experience complications related to bowel motility Outcome: Not Progressing   Problem: Education: Goal: Knowledge of risk factors and measures for prevention of condition will improve Outcome: Not Progressing   Problem: Clinical Measurements: Goal: Will remain free from infection Outcome: Progressing Goal: Diagnostic test results will improve Outcome: Progressing Goal: Respiratory complications will improve Outcome: Progressing Goal: Cardiovascular complication will be avoided Outcome: Progressing   Problem: Nutrition: Goal: Adequate nutrition will be maintained Outcome: Progressing   Problem: Coping: Goal: Level of anxiety will decrease Outcome: Progressing   Problem: Elimination: Goal: Will not experience complications related to urinary retention Outcome: Progressing   Problem: Pain Managment: Goal: General experience of comfort will improve Outcome: Progressing   Problem: Safety: Goal: Ability to remain free from injury will improve Outcome:  Progressing   Problem: Skin Integrity: Goal: Risk for impaired skin integrity will decrease Outcome: Progressing   Problem: Coping: Goal: Psychosocial and spiritual needs will be supported Outcome: Progressing   Problem: Respiratory: Goal: Will maintain a patent airway Outcome: Progressing Goal: Complications related to the disease process, condition or treatment will be avoided or minimized Outcome: Progressing

## 2021-01-08 NOTE — Progress Notes (Signed)
Daily Progress Note   Patient Name: Cynthia Wallace       Date: 01/08/2021 DOB: 16-Apr-1936  Age: 85 y.o. MRN#: [id number] Attending Physician: Flora Lipps, MD Primary Care Physician: Idelle Crouch, MD Admit Date: 01/04/2021  Reason for Consultation/Follow-up: Establishing goals of care  Subjective: Request received from attending for Palliative followup.  Called patient's daughter, [name]. [name] notes she was encouraged that patient was at 70% FiO2 on ventilator.  We discussed concerns that patient currently is not weaning from vent and has overall decline since admission without trending improvement.  Offered to meet for continued discussion of goals of care and illness trajectory.  [name] shared that patient was adamant that she wanted to "fight". Inquired if reconsultation meant that providers were "giving up" on her mom. Assured her that purpose of PMT is to provide good information, clarify expectations and discuss what comes next.    Review of Systems  Unable to perform ROS: Intubated    Length of Stay: 8  Current Medications: Scheduled Meds:  . vitamin C  250 mg Per Tube Daily  . aspirin  81 mg Per Tube Daily  . atorvastatin  10 mg Per Tube Daily  . chlorhexidine gluconate (MEDLINE KIT)  15 mL Mouth Rinse BID  . Chlorhexidine Gluconate Cloth  6 each Topical Daily  . docusate  100 mg Per Tube BID  . feeding supplement (PROSource TF)  45 mL Per Tube TID  . free water  200 mL Per Tube Q4H  . insulin aspart  0-15 Units Subcutaneous Q4H  . mouth rinse  15 mL Mouth Rinse 10 times per day  . methylPREDNISolone (SOLU-MEDROL) injection  60 mg Intravenous Q12H  . montelukast  10 mg Per Tube QHS  . pantoprazole sodium  40 mg Per Tube Daily  . polyethylene glycol  17 g Per Tube Daily  .  traZODone  50 mg Per Tube QHS    Continuous Infusions: . sodium chloride 250 mL (01/03/21 1835)  . ceFEPime (MAXIPIME) IV 2 g (01/08/21 1444)  . feeding supplement (VITAL AF 1.2 CAL) 1,000 mL (01/08/21 0057)  . fentaNYL infusion INTRAVENOUS 125 mcg/hr (01/08/21 1516)  . midazolam 4 mg/hr (01/08/21 1516)  . propofol (DIPRIVAN) infusion Stopped (01/08/21 1012)    PRN Meds: acetaminophen, docusate sodium, fentaNYL, midazolam, midazolam, polyethylene glycol,  senna-docusate, traMADol  Physical Exam Vitals and nursing note reviewed.             Vital Signs: BP 116/61   Pulse 76   Temp 98.2 F (36.8 C) (Axillary)   Resp (!) 23   Ht 5' 5"  (1.651 m)   Wt 90 kg   SpO2 97%   BMI 33.02 kg/m  SpO2: SpO2: 97 % O2 Device: O2 Device: Ventilator O2 Flow Rate: O2 Flow Rate (L/min): 0 L/min  Intake/output summary:   Intake/Output Summary (Last 24 hours) at 01/08/2021 1715 Last data filed at 01/08/2021 1600 Gross per 24 hour  Intake 3845.61 ml  Output 3040 ml  Net 805.61 ml   LBM: Last BM Date: 01/02/21 Baseline Weight: Weight: 90.7 kg Most recent weight: Weight: 90 kg       Palliative Assessment/Data: PPS: 10%      Patient Active Problem List   Diagnosis Date Noted  . Acute on chronic heart failure ([place])   . COVID-19   . Palliative care encounter   . Pneumonia due to COVID-19 virus 12/18/2020  . Combined hyperlipidemia 05/15/2020  . Pure hypercholesterolemia 05/15/2020  . Sick sinus syndrome ([place]) 03/10/2017  . Chronic pulmonary hypertension ([place]) 10/08/2016  . MGUS (monoclonal gammopathy of unknown significance) 09/17/2016  . Thrombocytopenia ([place]) 09/14/2016  . Anemia, unspecified 08/12/2016  . HTN (hypertension) 04/21/2016  . Pneumonia 04/21/2016  . Enlarged heart 04/21/2016  . Severe tricuspid valve insufficiency 03/19/2016  . Hypoxia 03/07/2016  . Left lower lobe pneumonia 03/07/2016  . Dyspnea on exertion 03/07/2016  . Neuropathy 04/24/2015  . Bronchitis  04/10/2015  . Bacteremia 04/02/2015  . Cardiomyopathy ([place]) 04/01/2015  . Paroxysmal atrial fibrillation ([place]) 04/01/2015  . Pelvic pain 04/01/2015  . Localized edema 03/21/2015  . Chronic a-fib ([place]) 02/26/2015  . OSA on CPAP 09/13/2014    Palliative Care Assessment & Plan   Patient Profile: Cynthia Wallace is a 85 y.o. female with multiple medical problems including IgG myeloma, chronic anemia, history of CHF, A. fib on chronic anticoagulation, pulmonary hypertension, and CKD stage III. Patient was admitted to hospital 01/04/2021 with progressive shortness of breath and was found to have Covid pneumonia. Her hospitalization has been complicated by worsening respiratory failure requiring intubation.Palliative care was consulted up address goals..   Assessment/Recommendations/Plan   Plan to meet with [name] and her spouse on Thursday- [name] will call with time  Goals of Care and Additional Recommendations:  Limitations on Scope of Treatment: Full Scope Treatment  Code Status:  DNR  Prognosis:   Unable to determine  Discharge Planning:  To Be Determined  Care plan was discussed with Dr. [name] and patient's daughter- [name].   Thank you for allowing the Palliative Medicine Team to assist in the care of this patient.   Total time: 38 minutes  Greater than 50%  of this time was spent counseling and coordinating care related to the above assessment and plan.  Mariana Kaufman, AGNP-C Palliative Medicine   Please contact Palliative Medicine Team phone at [phone] for questions and concerns.

## 2021-01-08 NOTE — Progress Notes (Signed)
CRITICAL CARE NOTE  85 y.o.femalewith multiple medical comorbidities that include IgG myeloma, chronic anemia, chronic systolic CHF(unknwn LVEF)on chronic diuretics, atrial fibrillation on chronic anticoagulation,chronic pulmonary hypertension,CKD stage III,PPM,hypertension and hyperlipidemia. Chest x-ray was concerning for bilateral heterogeneous space opacities.  Transferred to ICU as stepdown and started on BPAP. Patient continued to desaturate with any movement. Had to bump up optiflow to 100%. Patient remains full code. Had extensive discussion with patient and daughter. Patient understands that prognosis likely remains poor. But she would like to do a trial of intubation and mechanical ventilation.   1/27 admitted to ICU s/p intubation 1/31 failure to wean severe hypoxia 1/31 patient made DNR status 2/1 severe resp failure, remains on vent    CC  follow up respiratory failure  SUBJECTIVE Patient remains critically ill Prognosis is guarded  Vent Mode: PRVC FiO2 (%):  [40 %-70 %] 70 % Set Rate:  [24 bmp] 24 bmp Vt Set:  [400 mL] 400 mL PEEP:  [5 cmH20] 5 cmH20 Plateau Pressure:  [19 cmH20-25 cmH20] 19 cmH20 CBC    Component Value Date/Time   WBC 2.4 (L) 01/07/2021 0521   RBC 2.45 (L) 01/07/2021 0521   HGB 8.0 (L) 01/07/2021 0521   HGB 11.9 (L) 03/05/2015 1116   HCT 24.5 (L) 01/07/2021 0521   HCT 36.6 03/05/2015 1116   PLT 122 (L) 01/07/2021 0521   PLT 154 03/05/2015 1116   MCV 100.0 01/07/2021 0521   MCV 96 03/05/2015 1116   MCH 32.7 01/07/2021 0521   MCHC 32.7 01/07/2021 0521   RDW 19.3 (H) 01/07/2021 0521   RDW 15.0 (H) 03/05/2015 1116   LYMPHSABS 0.6 (L) 01/07/2021 0521   LYMPHSABS 1.6 03/05/2015 1116   MONOABS 0.4 01/07/2021 0521   MONOABS 0.6 03/05/2015 1116   EOSABS 0.0 01/07/2021 0521   EOSABS 0.1 03/05/2015 1116   BASOSABS 0.0 01/07/2021 0521   BASOSABS 0.0 03/05/2015 1116   BMP Latest Ref Rng & Units 01/07/2021 01/06/2021 01/05/2021  Glucose 70  - 99 mg/dL 178(H) 158(H) 174(H)  BUN 8 - 23 mg/dL 84(H) 86(H) 72(H)  Creatinine 0.44 - 1.00 mg/dL 1.32(H) 1.46(H) 1.45(H)  Sodium 135 - 145 mmol/L 153(H) 150(H) 147(H)  Potassium 3.5 - 5.1 mmol/L 4.7 4.4 4.2  Chloride 98 - 111 mmol/L 116(H) 116(H) 112(H)  CO2 22 - 32 mmol/L 28 27 25   Calcium 8.9 - 10.3 mg/dL 8.6(L) 8.0(L) 7.6(L)     BP 100/60   Pulse 87   Temp (!) 97.5 F (36.4 C) (Axillary)   Resp (!) 24   Ht 5\' 5"  (1.651 m)   Wt 90 kg   SpO2 96%   BMI 33.02 kg/m    I/O last 3 completed shifts: In: 3486.6 [I.V.:837.5; NG/GT:2649.1] Out: 2685 [Urine:2685] No intake/output data recorded.  SpO2: 96 % O2 Flow Rate (L/min): 0 L/min FiO2 (%): 70 %  Estimated body mass index is 33.02 kg/m as calculated from the following:   Height as of this encounter: 5\' 5"  (1.651 m).   Weight as of this encounter: 90 kg.  SIGNIFICANT EVENTS   REVIEW OF SYSTEMS  PATIENT IS UNABLE TO PROVIDE COMPLETE REVIEW OF SYSTEMS DUE TO SEVERE CRITICAL ILLNESS        PHYSICAL EXAMINATION:  GENERAL:critically ill appearing, +resp distress HEAD: Normocephalic, atraumatic.  EYES: Pupils equal, round, reactive to light.  No scleral icterus.  MOUTH: Moist mucosal membrane. NECK: Supple.  PULMONARY: +rhonchi, +wheezing CARDIOVASCULAR: S1 and S2. Regular rate and rhythm. No murmurs, rubs, or gallops.  GASTROINTESTINAL: Soft, nontender, -distended.  Positive bowel sounds.   MUSCULOSKELETAL: No swelling, clubbing, or edema.  NEUROLOGIC: obtunded, GCS<8 SKIN:intact,warm,dry  MEDICATIONS: I have reviewed all medications and confirmed regimen as documented   CULTURE RESULTS   Recent Results (from the past 240 hour(s))  SARS Coronavirus 2 by RT PCR (hospital order, performed in [facility] hospital lab) Nasopharyngeal Nasopharyngeal Swab     Status: Abnormal   Collection Time: 12/18/2020  9:08 PM   Specimen: Nasopharyngeal Swab  Result Value Ref Range Status   SARS Coronavirus 2 POSITIVE (A)  NEGATIVE Final    Comment: RESULT CALLED TO, READ BACK BY AND VERIFIED WITH: NOAH CRIFFINTH AT 2233 ON 01/05/2021 BY SS (NOTE) SARS-CoV-2 target nucleic acids are DETECTED  SARS-CoV-2 RNA is generally detectable in upper respiratory specimens  during the acute phase of infection.  Positive results are indicative  of the presence of the identified virus, but do not rule out bacterial infection or co-infection with other pathogens not detected by the test.  Clinical correlation with patient history and  other diagnostic information is necessary to determine patient infection status.  The expected result is negative.  Fact Sheet for Patients:   StrictlyIdeas.no   Fact Sheet for Healthcare Providers:   BankingDealers.co.za    This test is not yet approved or cleared by the [place] FDA and  has been authorized for detection and/or diagnosis of SARS-CoV-2 by FDA under an Emergency Use Authorization (EUA).  This EUA will remain in effect (meaning th is test can be used) for the duration of  the COVID-19 declaration under Section 564(b)(1) of the Act, 21 U.S.C. section 360-bbb-3(b)(1), unless the authorization is terminated or revoked sooner.  Performed at [facility], [address], [place], [place] [address]   Urine culture     Status: Abnormal   Collection Time: 12/09/2020  9:08 PM   Specimen: Urine, Random  Result Value Ref Range Status   Specimen Description   Final    URINE, RANDOM Performed at [facility], [address] [place], [place] [address]    Special Requests   Final    NONE Performed at [facility], [address] [place], [place] [address]    Culture (A)  Final    <10,000 COLONIES/mL INSIGNIFICANT GROWTH Performed at [facility], [place] [address] [place], [place] [address]    Report Status 01/02/2021 FINAL  Final  Blood culture (routine x 2)     Status: None   Collection  Time: 12/29/2020 10:50 PM   Specimen: BLOOD  Result Value Ref Range Status   Specimen Description BLOOD RIGHT ASSIST CONTROL  Final   Special Requests   Final    BOTTLES DRAWN AEROBIC AND ANAEROBIC Blood Culture adequate volume   Culture   Final    NO GROWTH 5 DAYS Performed at [facility], [address], [place], [place] [address]    Report Status 01/05/2021 FINAL  Final  Blood culture (routine x 2)     Status: None   Collection Time: 12/26/2020 10:51 PM   Specimen: BLOOD  Result Value Ref Range Status   Specimen Description BLOOD LEFT ASSIST CONTROL  Final   Special Requests   Final    BOTTLES DRAWN AEROBIC AND ANAEROBIC Blood Culture adequate volume   Culture   Final    NO GROWTH 5 DAYS Performed at [facility], [address] [place], [place] [address]    Report Status 01/05/2021 FINAL  Final  Culture, respiratory (non-expectorated)     Status: None   Collection Time: 01/04/21 11:42 AM   Specimen: Tracheal Aspirate; Respiratory  Result Value Ref Range Status   Specimen Description   Final    TRACHEAL ASPIRATE Performed at [facility], [address], [place], [place] [address]    Special Requests   Final    Normal Performed at [facility], [place], [place] [address]    Gram Stain   Final    RARE WBC PRESENT,BOTH PMN AND MONONUCLEAR MODERATE YEAST FEW GRAM NEGATIVE RODS Performed at [facility], [place] [address], [place], [place] [address]    Culture   Final    RARE PSEUDOMONAS AERUGINOSA RARE CANDIDA ALBICANS    Report Status 01/07/2021 FINAL  Final   Organism ID, Bacteria PSEUDOMONAS AERUGINOSA  Final      Susceptibility   Pseudomonas aeruginosa - MIC*    CEFTAZIDIME <=1 SENSITIVE Sensitive     CIPROFLOXACIN 0.5 SENSITIVE Sensitive     GENTAMICIN 2 SENSITIVE Sensitive     IMIPENEM <=0.25 SENSITIVE Sensitive     PIP/TAZO <=4 SENSITIVE Sensitive     CEFEPIME 2 SENSITIVE Sensitive     * RARE  PSEUDOMONAS AERUGINOSA          IMAGING    No results found.   Nutrition Status: Nutrition Problem: Inadequate oral intake Etiology: inability to eat (pt sedated and ventilated) Signs/Symptoms: NPO status       Indwelling Urinary Catheter continued, requirement due to   Reason to continue Indwelling Urinary Catheter strict Intake/Output monitoring for hemodynamic instability   Central Line/ continued, requirement due to  Reason to continue [place] of central venous pressure or other hemodynamic parameters and poor IV access   Ventilator continued, requirement due to severe respiratory failure   Ventilator Sedation RASS 0 to -2      ASSESSMENT AND PLAN SYNOPSIS   85 y.o.femalewith Covid-19 pneumoniawith superimposed bacterial infection PSEUDOMONAL PNEUMONIA intubated on 1/27 for worsening respiratory failure. SEPSIS present on admission   Acute hypoxemic respiratory failure due to COVID-19 pneumonia / ARDS Mechanical ventilation via ARDS protocol, target PRVC 6 cc/kg Wean PEEP and FiO2 as able Goal plateau pressure less than 30, driving pressure less than 15 Paralytics if necessary for vent synchrony, gas exchange Cycle prone positioning if necessary for oxygenation VAP prevention order set  Severe ACUTE Hypoxic and Hypercapnic Respiratory Failure -continue Full MV support -continue Bronchodilator Therapy -Wean Fio2 and PEEP as tolerated -VAP/VENT bundle implementation Unable to wean due to severe hypoxia  ACUTE DIASTOLIC CARDIAC FAILURE- -oxygen as needed -Lasix as tolerated    ACUTE KIDNEY INJURY/Renal Failure -continue Foley Catheter-assess need -Avoid nephrotoxic agents -Follow urine output, BMP -Ensure adequate renal perfusion, optimize oxygenation -Renal dose medications     NEUROLOGY Acute toxic metabolic encephalopathy, need for sedation Goal RASS -2 to -3   CARDIAC ICU monitoring  ID -continue IV abx as  prescibed -follow up cultures  GI GI PROPHYLAXIS as indicated   DIET-->TF's as tolerated Constipation protocol as indicated  ENDO - will use ICU hypoglycemic\Hyperglycemia protocol if indicated     ELECTROLYTES -follow labs as needed -replace as needed -pharmacy consultation and following   DVT/GI PRX ordered and assessed TRANSFUSIONS AS NEEDED MONITOR FSBS I Assessed the need for Labs I Assessed the need for Foley I Assessed the need for Central Venous Line Family Discussion when available I Assessed the need for Mobilization I made an  Assessment of medications to be adjusted accordingly Safety Risk assessment completed   CASE DISCUSSED IN MULTIDISCIPLINARY ROUNDS WITH ICU TEAM  Critical Care Time devoted to patient care services described in this note is 55 minutes.   Overall, patient is critically ill, prognosis is guarded.  Patient with Multiorgan failure and at high risk for cardiac arrest and death.    Corrin Parker, M.D.  [facility]  Medical Director [place] Director [facility] Cardio-Pulmonary Department

## 2021-01-08 DEATH — deceased

## 2021-01-09 DIAGNOSIS — I5023 Acute on chronic systolic (congestive) heart failure: Secondary | ICD-10-CM

## 2021-01-09 LAB — FERRITIN: Ferritin: 1087 ng/mL — ABNORMAL HIGH (ref 11–307)

## 2021-01-09 LAB — GLUCOSE, CAPILLARY
Glucose-Capillary: 172 mg/dL — ABNORMAL HIGH (ref 70–99)
Glucose-Capillary: 188 mg/dL — ABNORMAL HIGH (ref 70–99)
Glucose-Capillary: 145 mg/dL — ABNORMAL HIGH (ref 70–99)
Glucose-Capillary: 164 mg/dL — ABNORMAL HIGH (ref 70–99)
Glucose-Capillary: 170 mg/dL — ABNORMAL HIGH (ref 70–99)
Glucose-Capillary: 149 mg/dL — ABNORMAL HIGH (ref 70–99)

## 2021-01-09 LAB — RENAL FUNCTION PANEL
Albumin: 1.8 g/dL — ABNORMAL LOW (ref 3.5–5.0)
Anion gap: 7 (ref 5–15)
BUN: 81 mg/dL — ABNORMAL HIGH (ref 8–23)
CO2: 30 mmol/L (ref 22–32)
Calcium: 8.3 mg/dL — ABNORMAL LOW (ref 8.9–10.3)
Chloride: 121 mmol/L — ABNORMAL HIGH (ref 98–111)
Creatinine, Ser: 0.95 mg/dL (ref 0.44–1.00)
GFR, Estimated: 60 mL/min — ABNORMAL LOW (ref >60)
Glucose, Bld: 200 mg/dL — ABNORMAL HIGH (ref 70–99)
Phosphorus: 2.6 mg/dL (ref 2.5–4.6)
Potassium: 4.9 mmol/L (ref 3.5–5.1)
Sodium: 158 mmol/L — ABNORMAL HIGH (ref 135–145)

## 2021-01-09 LAB — FIBRIN DERIVATIVES D-DIMER (ARMC ONLY): Fibrin derivatives D-dimer (ARMC): >7500 ng/mL (FEU) — ABNORMAL HIGH (ref 0.00–499.00)

## 2021-01-09 LAB — MAGNESIUM: Magnesium: 3.3 mg/dL — ABNORMAL HIGH (ref 1.7–2.4)

## 2021-01-09 MED ORDER — FREE WATER
200.0000 mL | Status: DC
  Administered 2021-01-09 – 2021-01-11 (×26): 200 mL

## 2021-01-09 MED ORDER — ENOXAPARIN SODIUM 60 MG/0.6ML ~~LOC~~ SOLN
0.5000 mg/kg | SUBCUTANEOUS | Status: DC
  Administered 2021-01-09 – 2021-01-10 (×2): 45 mg via SUBCUTANEOUS
  Filled 2021-01-09 (×3): qty 0.6

## 2021-01-09 MED ORDER — MAGNESIUM SULFATE 2 GM/50ML IV SOLN
2.0000 g | Freq: Once | INTRAVENOUS | Status: DC
  Filled 2021-01-09: qty 50

## 2021-01-09 NOTE — Progress Notes (Signed)
CRITICAL CARE NOTE 85 y.o.femalewith multiple medical comorbidities that include IgG myeloma, chronic anemia, chronic systolic CHF(unknwn LVEF)on chronic diuretics, atrial fibrillation on chronic anticoagulation,chronic pulmonary hypertension,CKD stage III,PPM,hypertension and hyperlipidemia. Chest x-ray was concerning for bilateral heterogeneous space opacities.  Transferred to ICU as stepdown and started on BPAP. Patient continued to desaturate with any movement. Had to bump up optiflow to 100%. Patient remains full code. Had extensive discussion with patient and daughter. Patient understands that prognosis likely remains poor. But she would like to do a trial of intubation and mechanical ventilation.   1/27admitted to ICU s/p intubation 1/31 failure to wean severe hypoxia 1/31 patient made DNR status 2/1 severe resp failure, remains on vent 2/2 severe resp failure     CC  follow up respiratory failure  SUBJECTIVE Patient remains critically ill Prognosis is guarded   BP 134/62   Pulse 93   Temp 98.7 F (37.1 C) (Axillary)   Resp 19   Ht 5\' 5"  (1.651 m)   Wt 90.2 kg   SpO2 97%   BMI 33.09 kg/m    I/O last 3 completed shifts: In: 1841.9 [I.V.:621.1; NG/GT:1220.8] Out: 3890 [Urine:3890] No intake/output data recorded.  SpO2: 97 % O2 Flow Rate (L/min): 0 L/min FiO2 (%): 50 %  Estimated body mass index is 33.09 kg/m as calculated from the following:   Height as of this encounter: 5\' 5"  (1.651 m).   Weight as of this encounter: 90.2 kg.  SIGNIFICANT EVENTS   REVIEW OF SYSTEMS  PATIENT IS UNABLE TO PROVIDE COMPLETE REVIEW OF SYSTEMS DUE TO SEVERE CRITICAL ILLNESS       PHYSICAL EXAMINATION: GENERAL:critically ill appearing, +resp distress NECK: Supple.  PULMONARY: +rhonchi, +wheezing CARDIOVASCULAR: S1 and S2.  GASTROINTESTINAL: Soft, nontender, +Positive bowel sounds.  MUSCULOSKELETAL: No swelling, clubbing, or edema.  NEUROLOGIC: obtunded,  GCS<8 SKIN:intact,warm,dry     MEDICATIONS: I have reviewed all medications and confirmed regimen as documented   CULTURE RESULTS   Recent Results (from the past 240 hour(s))  SARS Coronavirus 2 by RT PCR (hospital order, performed in [facility] hospital lab) Nasopharyngeal Nasopharyngeal Swab     Status: Abnormal   Collection Time: 01/01/2021  9:08 PM   Specimen: Nasopharyngeal Swab  Result Value Ref Range Status   SARS Coronavirus 2 POSITIVE (A) NEGATIVE Final    Comment: RESULT CALLED TO, READ BACK BY AND VERIFIED WITH: NOAH CRIFFINTH AT 2233 ON 12/13/2020 BY SS (NOTE) SARS-CoV-2 target nucleic acids are DETECTED  SARS-CoV-2 RNA is generally detectable in upper respiratory specimens  during the acute phase of infection.  Positive results are indicative  of the presence of the identified virus, but do not rule out bacterial infection or co-infection with other pathogens not detected by the test.  Clinical correlation with patient history and  other diagnostic information is necessary to determine patient infection status.  The expected result is negative.  Fact Sheet for Patients:   BoilerBrush.com.cy   Fact Sheet for Healthcare Providers:   https://pope.com/    This test is not yet approved or cleared by the [place] FDA and  has been authorized for detection and/or diagnosis of SARS-CoV-2 by FDA under an Emergency Use Authorization (EUA).  This EUA will remain in effect (meaning th is test can be used) for the duration of  the COVID-19 declaration under Section 564(b)(1) of the Act, 21 U.S.C. section 360-bbb-3(b)(1), unless the authorization is terminated or revoked sooner.  Performed at [facility], [address] [place], [place]  [address]   Urine culture     Status: Abnormal   Collection Time: 01/06/2021  9:08 PM   Specimen: Urine, Random  Result Value Ref Range Status   Specimen Description   Final     URINE, RANDOM Performed at [facility], [address], [place], [place] [address]    Special Requests   Final    NONE Performed at [facility], [address] [place], [place] [address]    Culture (A)  Final    <10,000 COLONIES/mL INSIGNIFICANT GROWTH Performed at [facility], [place] [address], [place], [place] [address]    Report Status 01/02/2021 FINAL  Final  Blood culture (routine x 2)     Status: None   Collection Time: 12/22/2020 10:50 PM   Specimen: BLOOD  Result Value Ref Range Status   Specimen Description BLOOD RIGHT ASSIST CONTROL  Final   Special Requests   Final    BOTTLES DRAWN AEROBIC AND ANAEROBIC Blood Culture adequate volume   Culture   Final    NO GROWTH 5 DAYS Performed at [facility], [address] [place], [place] [address]    Report Status 01/05/2021 FINAL  Final  Blood culture (routine x 2)     Status: None   Collection Time: 12/20/2020 10:51 PM   Specimen: BLOOD  Result Value Ref Range Status   Specimen Description BLOOD LEFT ASSIST CONTROL  Final   Special Requests   Final    BOTTLES DRAWN AEROBIC AND ANAEROBIC Blood Culture adequate volume   Culture   Final    NO GROWTH 5 DAYS Performed at [facility], [address], [place], [place] [address]    Report Status 01/05/2021 FINAL  Final  Culture, respiratory (non-expectorated)     Status: None   Collection Time: 01/04/21 11:42 AM   Specimen: Tracheal Aspirate; Respiratory  Result Value Ref Range Status   Specimen Description   Final    TRACHEAL ASPIRATE Performed at [facility], [address], [place], [place] [address]    Special Requests   Final    Normal Performed at [facility], [place], [place] [address]    Gram Stain   Final    RARE WBC PRESENT,BOTH PMN AND MONONUCLEAR MODERATE YEAST FEW GRAM NEGATIVE RODS Performed at [facility], [place] [address] [place], [place] [address]    Culture    Final    RARE PSEUDOMONAS AERUGINOSA RARE CANDIDA ALBICANS    Report Status 01/07/2021 FINAL  Final   Organism ID, Bacteria PSEUDOMONAS AERUGINOSA  Final      Susceptibility   Pseudomonas aeruginosa - MIC*    CEFTAZIDIME <=1 SENSITIVE Sensitive     CIPROFLOXACIN 0.5 SENSITIVE Sensitive     GENTAMICIN 2 SENSITIVE Sensitive     IMIPENEM <=0.25 SENSITIVE Sensitive     PIP/TAZO <=4 SENSITIVE Sensitive     CEFEPIME 2 SENSITIVE Sensitive     * RARE PSEUDOMONAS AERUGINOSA          IMAGING    No results found.   Nutrition Status: Nutrition Problem: Inadequate oral intake Etiology: inability to eat (pt sedated and ventilated) Signs/Symptoms: NPO status       Indwelling Urinary Catheter continued, requirement due to   Reason to continue Indwelling Urinary Catheter strict Intake/Output monitoring for hemodynamic instability   Central Line/ continued, requirement due to  Reason to continue [place] of central venous pressure or other hemodynamic parameters  and poor IV access   Ventilator continued, requirement due to severe respiratory failure   Ventilator Sedation RASS 0 to -2      ASSESSMENT AND PLAN SYNOPSIS   85 y.o.femalewith Covid-19 pneumoniawith superimposed bacterial infectionPSEUDOMONAL PNEUMONIAintubated on 1/27 for worsening respiratory failure. SEPSIS present on admission  Failure to wean from vent, severe resp distress and failure Daughter wishes NOT to be present at time of vent weaning attempts  Acute hypoxemic respiratory failure due to COVID-19 pneumonia Mechanical ventilation via ARDS protocol, target PRVC 6 cc/kg Wean PEEP and FiO2 as able Goal plateau pressure less than 30, driving pressure less than 15 VAP prevention order set IV STEROIDS Therapy Follow inflammatory markers as needed with CRP Vitamin C, zinc Plan to repeat and check resp cultures as needed   Severe ACUTE Hypoxic and Hypercapnic Respiratory  Failure -continue Full MV support -continue Bronchodilator Therapy -Wean Fio2 and PEEP as tolerated -VAP/VENT bundle implementation  ACUTE DIASTOLIC CARDIAC FAILURE-  -oxygen as needed -Lasix as tolerated   Morbid obesity, possible OSA.   Will certainly impact respiratory mechanics, ventilator weaning Suspect will need to consider additional PEEP   ACUTE KIDNEY INJURY/Renal Failure -continue Foley Catheter-assess need -Avoid nephrotoxic agents -Follow urine output, BMP -Ensure adequate renal perfusion, optimize oxygenation -Renal dose medications     NEUROLOGY Acute toxic metabolic encephalopathy, need for sedation Goal RASS -2 to -3   CARDIAC ICU monitoring  ID -continue IV abx as prescibed -follow up cultures  GI GI PROPHYLAXIS as indicated   DIET-->TF's as tolerated Constipation protocol as indicated  ENDO - will use ICU hypoglycemic\Hyperglycemia protocol if indicated    ELECTROLYTES -follow labs as needed -replace as needed -pharmacy consultation and following   DVT/GI PRX ordered and assessed TRANSFUSIONS AS NEEDED MONITOR FSBS I Assessed the need for Labs I Assessed the need for Foley I Assessed the need for Central Venous Line Family Discussion when available I Assessed the need for Mobilization I made an Assessment of medications to be adjusted accordingly Safety Risk assessment completed   CASE DISCUSSED IN MULTIDISCIPLINARY ROUNDS WITH ICU TEAM  Critical Care Time devoted to patient care services described in this note is 53  minutes.   Overall, patient is critically ill, prognosis is guarded.  Patient with Multiorgan failure and at high risk for cardiac arrest and death.   Patient is DNR status  Corrin Parker, M.D.  [facility]  Medical Director [place] Director [facility] Cardio-Pulmonary Department

## 2021-01-09 NOTE — Progress Notes (Signed)
GOALS OF CARE DISCUSSION  The Clinical status was relayed to family in detail. Daughter Lorrie  Updated and notified of patients medical condition.  Patient remains unresponsive and will not open eyes to command.    Patient is having a weak cough and struggling to remove secretions.   Patient with increased WOB and using accessory muscles to breathe Explained to family course of therapy and the modalities     Patient with Progressive multiorgan failure with a very high probablity of a very minimal chance of meaningful recovery despite all aggressive and optimal medical therapy. Patient is in the Dying  Process associated with Suffering.  Family understands the situation.  They have consented and agreed to DNR and will  Proceed with comfort care measures in next 24-48 hrs, at this time, I will implement END of LIFE policy for visitation   Family are satisfied with Plan of action and management. All questions answered  Additional CC time 32 mins   Zaida Reiland Patricia Pesa, M.D.  [facility]  Medical Director [place] Director [facility] Cardio-Pulmonary Department

## 2021-01-10 DIAGNOSIS — J9601 Acute respiratory failure with hypoxia: Secondary | ICD-10-CM

## 2021-01-10 DIAGNOSIS — Z7189 Other specified counseling: Secondary | ICD-10-CM

## 2021-01-10 DIAGNOSIS — J189 Pneumonia, unspecified organism: Secondary | ICD-10-CM

## 2021-01-10 LAB — RENAL FUNCTION PANEL
Albumin: 1.7 g/dL — ABNORMAL LOW (ref 3.5–5.0)
Anion gap: 8 (ref 5–15)
BUN: 78 mg/dL — ABNORMAL HIGH (ref 8–23)
CO2: 30 mmol/L (ref 22–32)
Calcium: 8.4 mg/dL — ABNORMAL LOW (ref 8.9–10.3)
Chloride: 121 mmol/L — ABNORMAL HIGH (ref 98–111)
Creatinine, Ser: 1.01 mg/dL — ABNORMAL HIGH (ref 0.44–1.00)
GFR, Estimated: 56 mL/min — ABNORMAL LOW (ref >60)
Glucose, Bld: 222 mg/dL — ABNORMAL HIGH (ref 70–99)
Phosphorus: 3.5 mg/dL (ref 2.5–4.6)
Potassium: 5.2 mmol/L — ABNORMAL HIGH (ref 3.5–5.1)
Sodium: 159 mmol/L — ABNORMAL HIGH (ref 135–145)

## 2021-01-10 LAB — MAGNESIUM: Magnesium: 3.3 mg/dL — ABNORMAL HIGH (ref 1.7–2.4)

## 2021-01-10 LAB — FIBRIN DERIVATIVES D-DIMER (ARMC ONLY): Fibrin derivatives D-dimer (ARMC): >7500 ng/mL (FEU) — ABNORMAL HIGH (ref 0.00–499.00)

## 2021-01-10 LAB — GLUCOSE, CAPILLARY
Glucose-Capillary: 133 mg/dL — ABNORMAL HIGH (ref 70–99)
Glucose-Capillary: 143 mg/dL — ABNORMAL HIGH (ref 70–99)
Glucose-Capillary: 150 mg/dL — ABNORMAL HIGH (ref 70–99)
Glucose-Capillary: 158 mg/dL — ABNORMAL HIGH (ref 70–99)
Glucose-Capillary: 186 mg/dL — ABNORMAL HIGH (ref 70–99)
Glucose-Capillary: 203 mg/dL — ABNORMAL HIGH (ref 70–99)

## 2021-01-10 LAB — FERRITIN: Ferritin: 977 ng/mL — ABNORMAL HIGH (ref 11–307)

## 2021-01-10 MED ORDER — METHYLPREDNISOLONE SODIUM SUCC 40 MG IJ SOLR
20.0000 mg | Freq: Two times a day (BID) | INTRAMUSCULAR | Status: DC
  Administered 2021-01-10 – 2021-01-11 (×2): 20 mg via INTRAVENOUS
  Filled 2021-01-10 (×2): qty 1

## 2021-01-10 NOTE — Progress Notes (Signed)
Daily Progress Note   Patient Name: Cynthia Wallace       Date: 01/10/2021 DOB: [birth year]  Age: 85 y.o. MRN#: [id number] Attending Physician: Flora Lipps, MD Primary Care Physician: Idelle Crouch, MD Admit Date: 01/06/2021  Reason for Consultation/Follow-up: Establishing goals of care  Subjective: Met with patient's daughter Cynthia Wallace and Cynthia Wallace's brother in law- Cynthia Wallace. Cynthia Wallace, Cynthia Wallace's spouse was present via speakerphone.  Life review of patient- married and spouse died young, remarried and spouse died of cancer, Cynthia Wallace is only daughter- has brothers. She is known for being a loving caretaker, grandmother, mother, sister, friend. Loved by everyone. Christian. Enjoyed going dancing. She has been declining in health for the last several months with heart failure and myeloma.  Functional status had been declining and this was becoming frustrating for patient as she enjoyed being active and traveling.  Current health status and comorbidities discussed. Family aware that patient's previous frailty has affected her ability to recover from this acute illness.  They are clear that patient would not want her life to prolonged without the realistic goal that she would recover to a better functional state then she was before this hospitalization.  Comfort care and extubation were discussed.  Primary goal is that patient does not suffer at end of life and that she does not die alone.  Cynthia Wallace is unsure if her brothers will want to be here- but wants to offer them the opportunity.  Emotional support provided.  Prepared Cynthia Wallace that patient is fragile- I am hopeful and expecting that patient will be able to sustain until tomorrow for full comfort measures and planned extubation- however, there is always the risk that  patient could unexpectedly decline overnight and transition before then.    Review of Systems  Unable to perform ROS: Intubated    Length of Stay: 10  Current Medications: Scheduled Meds:  . vitamin C  250 mg Per Tube Daily  . aspirin  81 mg Per Tube Daily  . atorvastatin  10 mg Per Tube Daily  . chlorhexidine gluconate (MEDLINE KIT)  15 mL Mouth Rinse BID  . Chlorhexidine Gluconate Cloth  6 each Topical Daily  . docusate  100 mg Per Tube BID  . enoxaparin (LOVENOX) injection  0.5 mg/kg Subcutaneous Q24H  . feeding supplement (PROSource TF)  45 mL Per  Tube TID  . free water  200 mL Per Tube Q2H  . insulin aspart  0-15 Units Subcutaneous Q4H  . mouth rinse  15 mL Mouth Rinse 10 times per day  . methylPREDNISolone (SOLU-MEDROL) injection  20 mg Intravenous Q12H  . montelukast  10 mg Per Tube QHS  . pantoprazole sodium  40 mg Per Tube Daily  . polyethylene glycol  17 g Per Tube Daily  . traZODone  50 mg Per Tube QHS    Continuous Infusions: . sodium chloride 250 mL (01/03/21 1835)  . ceFEPime (MAXIPIME) IV 2 g (01/10/21 0315)  . feeding supplement (VITAL AF 1.2 CAL) 1,000 mL (01/09/21 2216)  . fentaNYL infusion INTRAVENOUS 125 mcg/hr (01/10/21 1017)  . magnesium sulfate bolus IVPB    . midazolam 5 mg/hr (01/10/21 0526)  . propofol (DIPRIVAN) infusion 30 mcg/kg/min (01/10/21 1204)    PRN Meds: acetaminophen, docusate sodium, fentaNYL, midazolam, midazolam, polyethylene glycol, senna-docusate, traMADol  Physical Exam Vitals and nursing note reviewed.  Constitutional:      Appearance: She is ill-appearing.  Cardiovascular:     Rate and Rhythm: Normal rate.             Vital Signs: BP 123/71   Pulse 93   Temp (!) 100.7 F (38.2 C) (Axillary)   Resp (!) 24   Ht 5' 5"  (1.651 m)   Wt 91.1 kg   SpO2 92%   BMI 33.42 kg/m  SpO2: SpO2: 92 % O2 Device: O2 Device: Ventilator O2 Flow Rate: O2 Flow Rate (L/min): 0 L/min  Intake/output summary:   Intake/Output Summary  (Last 24 hours) at 01/10/2021 1346 Last data filed at 01/10/2021 7591 Gross per 24 hour  Intake 674.85 ml  Output 2850 ml  Net -2175.15 ml   LBM: Last BM Date: 01/09/21 Baseline Weight: Weight: 90.7 kg Most recent weight: Weight: 91.1 kg       Palliative Assessment/Data: PPS: 10%      Patient Active Problem List   Diagnosis Date Noted  . Acute on chronic heart failure ([place])   . COVID-19   . Palliative care encounter   . Pneumonia due to COVID-19 virus 01/05/2021  . Combined hyperlipidemia 05/15/2020  . Pure hypercholesterolemia 05/15/2020  . Sick sinus syndrome ([place]) 03/10/2017  . Chronic pulmonary hypertension ([place]) 10/08/2016  . MGUS (monoclonal gammopathy of unknown significance) 09/17/2016  . Thrombocytopenia ([place]) 09/14/2016  . Anemia, unspecified 08/12/2016  . HTN (hypertension) 04/21/2016  . Pneumonia 04/21/2016  . Enlarged heart 04/21/2016  . Severe tricuspid valve insufficiency 03/19/2016  . Hypoxia 03/07/2016  . Left lower lobe pneumonia 03/07/2016  . Dyspnea on exertion 03/07/2016  . Neuropathy 04/24/2015  . Bronchitis 04/10/2015  . Bacteremia 04/02/2015  . Cardiomyopathy ([place]) 04/01/2015  . Paroxysmal atrial fibrillation ([place]) 04/01/2015  . Pelvic pain 04/01/2015  . Localized edema 03/21/2015  . Chronic a-fib ([place]) 02/26/2015  . OSA on CPAP 09/13/2014    Palliative Care Assessment & Plan   Patient Profile: Cynthia J Harperis a 85 y.o.femalewith multiple medical problems including IgG myeloma, chronic anemia, history of CHF, A. fib on chronic anticoagulation, pulmonary hypertension, and CKD stage III. Patient was admitted to hospital 01/02/2021 with progressive shortness of breath and was found to have Covid pneumonia. Her hospitalization has been complicated by pseudomonal pneumonia, worsening respiratory failure requiringintubation.She has not been successfully weaning from ventilator and has had worsening respiratory status even with full vent  support. Palliative care was consulted up address goals..   Assessment/Recommendations/Plan  Continue  current care Visitation per end of life policy Plan for extubation to full comfort measures only tomorrow Chaplain consult for end of life patient and family support  Goals of Care and Additional Recommendations: Limitations on Scope of Treatment: Full Comfort Care planned for tomorrow- would not recommend escalation of care overnight  Code Status: DNR  Prognosis:  Hours - Days  Discharge Planning: Anticipated Hospital Death  Care plan was discussed with patient's family and care team.   Thank you for allowing the Palliative Medicine Team to assist in the care of this patient.   Total time : 119 minutes Greater than 50%  of this time was spent counseling and coordinating care related to the above assessment and plan.  Mariana Kaufman, AGNP-C Palliative Medicine   Please contact Palliative Medicine Team phone at [phone] for questions and concerns.

## 2021-01-10 NOTE — Progress Notes (Signed)
Nutrition Follow Up Note   DOCUMENTATION CODES:   Obesity unspecified  INTERVENTION:   Continue Vital 1.2 @ 72ml/hr + Pro-Source 56ml TID via tube  Propofol: 16.39ml/hr- provides 427kcal/day   Free water flushes 230ml q2 hours per MD  Regimen provides 1560kcal/day, 123g/day protein and 3364ml/day of free water (with propofol provides 1987kcal/day)   NUTRITION DIAGNOSIS:   Inadequate oral intake related to inability to eat (pt sedated and ventilated) as evidenced by NPO status.  GOAL:   Provide needs based on ASPEN/SCCM guidelines  MONITOR:   Vent status,Labs,Weight trends,TF tolerance,Skin,I & O's  REASON FOR ASSESSMENT:   Consult Enteral/tube feeding initiation and management  ASSESSMENT:   85 y.o. female with multiple medical problems including IgG myeloma, chronic anemia, history of CHF, A. fib on chronic anticoagulation, pulmonary hypertension, and CKD stage III.  Patient was admitted to hospital January 02, 2021 with progressive shortness of breath and was found to have Covid pneumonia.  Pt sedated and ventilated. OGT in place. Pt tolerating tube feeds at goal rate; will adjust feeds per COVID guidelines. Per chart, pt has remained fairly weight stable since admit. Palliative care following.   Medications reviewed and include: vitamin C, aspirin, colace, lovenox, insulin, solu-medrol, protonix, miralax, cefepime, fentanyl, propofol   Labs reviewed: Na 159(H), K 5.2(H), BUN 78(H), creat 1.01(H), P 3.5 wnl, Mg 3.3(H) Wbc- 2.4(L), Hgb 8.0(L), Hct 24.5(L) cbgs- 186, 203 x 24 hrs  Patient is currently intubated on ventilator support MV: 8.6 L/min Temp (24hrs), Avg:98.9 F (37.2 C), Min:98.3 F (36.8 C), Max:100.7 F (38.2 C)  Propofol: 16.63ml/hr- provides 427kcal/day   MAP- > 41mmHg  UOP- 2830ml/hr   Diet Order:   Diet Order            Diet NPO time specified  Diet effective now                EDUCATION NEEDS:   No education needs have been identified at  this time  Skin:  Skin Assessment: Reviewed RN Assessment  Last BM:  2/2  Height:   Ht Readings from Last 1 Encounters:  01/02/21 5\' 5"  (1.651 m)    Weight:   Wt Readings from Last 1 Encounters:  01/10/21 91.1 kg    Ideal Body Weight:  56.8 kg  BMI:  Body mass index is 33.42 kg/m.  Estimated Nutritional Needs:   Kcal:  1600kcal/day  Protein:  >115g/day  Fluid:  1.4-1.7L/day  Koleen Distance MS, RD, LDN Please refer to [facility] for RD and/or RD on-call/weekend/after hours pager

## 2021-01-10 NOTE — Progress Notes (Signed)
GOALS OF CARE DISCUSSION  The Clinical status was relayed to family in detail. Daughter Lorrie  Updated and notified of patients medical condition.  Patient remains unresponsive and will not open eyes to command.    Patient is having a weak cough and struggling to remove secretions.   Patient with increased WOB and using accessory muscles to breathe Explained to family course of therapy and the modalities     Patient with Progressive multiorgan failure with a very high probablity of a very minimal chance of meaningful recovery despite all aggressive and optimal medical therapy. Patient is in the Dying  Process associated with Suffering.  Family understands the situation.  They have consented and agreed to DNR and would like to proceed with Comfort care measures in next 24 hrs  Family are satisfied with Plan of action and management. All questions answered  Additional CC time 35 mins   Nevea Spiewak Patricia Pesa, M.D.  [facility]  Medical Director [place] Director [facility] Cardio-Pulmonary Department

## 2021-01-10 NOTE — Progress Notes (Signed)
Order Requisition for this patient. Family was meeting with palliative care and not available when I was. Asked staff to page chaplain if and when needed.

## 2021-01-10 NOTE — Progress Notes (Signed)
CRITICAL CARE NOTE 85 y.o.femalewith multiple medical comorbidities that include IgG myeloma, chronic anemia, chronic systolic CHF(unknwn LVEF)on chronic diuretics, atrial fibrillation on chronic anticoagulation,chronic pulmonary hypertension,CKD stage III,PPM,hypertension and hyperlipidemia. Chest x-ray was concerning for bilateral heterogeneous space opacities.  Transferred to ICU as stepdown and started on BPAP. Patient continued to desaturate with any movement. Had to bump up optiflow to 100%. Patient remains full code. Had extensive discussion with patient and daughter. Patient understands that prognosis likely remains poor. But she would like to do a trial of intubation and mechanical ventilation.   1/27admitted to ICU s/p intubation 1/31 failure to wean severe hypoxia 1/31 patient made DNR status 2/1 severe resp failure, remains on vent 2/2 severe resp failure failed weaning rials 2/3 severe resp failure, failed weaning trial    CC  follow up respiratory failure  SUBJECTIVE Patient remains critically ill Prognosis is guarded   BP (!) 125/57   Pulse 95   Temp 98.7 F (37.1 C)   Resp (!) 26   Ht 5\' 5"  (1.651 m)   Wt 91.1 kg   SpO2 94%   BMI 33.42 kg/m    I/O last 3 completed shifts: In: 824.7 [I.V.:824.7] Out: 4200 [Urine:4200] No intake/output data recorded.  SpO2: 94 % O2 Flow Rate (L/min): 0 L/min FiO2 (%): 40 %  Estimated body mass index is 33.42 kg/m as calculated from the following:   Height as of this encounter: 5\' 5"  (1.651 m).   Weight as of this encounter: 91.1 kg.  SIGNIFICANT EVENTS   REVIEW OF SYSTEMS  PATIENT IS UNABLE TO PROVIDE COMPLETE REVIEW OF SYSTEMS DUE TO SEVERE CRITICAL ILLNESS       PHYSICAL EXAMINATION: GENERAL:critically ill appearing, +resp distress NECK: Supple.  PULMONARY: +rhonchi, +wheezing CARDIOVASCULAR: S1 and S2.  GASTROINTESTINAL: Soft, nontender, +Positive bowel sounds.  MUSCULOSKELETAL: No swelling,  clubbing, or edema.  NEUROLOGIC: obtunded, GCS<8 SKIN:intact,warm,dry     MEDICATIONS: I have reviewed all medications and confirmed regimen as documented   CULTURE RESULTS   Recent Results (from the past 240 hour(s))  SARS Coronavirus 2 by RT PCR (hospital order, performed in [facility] hospital lab) Nasopharyngeal Nasopharyngeal Swab     Status: Abnormal   Collection Time: 01/15/2021  9:08 PM   Specimen: Nasopharyngeal Swab  Result Value Ref Range Status   SARS Coronavirus 2 POSITIVE (A) NEGATIVE Final    Comment: RESULT CALLED TO, READ BACK BY AND VERIFIED WITH: NOAH CRIFFINTH AT 2233 ON 01-15-2021 BY SS (NOTE) SARS-CoV-2 target nucleic acids are DETECTED  SARS-CoV-2 RNA is generally detectable in upper respiratory specimens  during the acute phase of infection.  Positive results are indicative  of the presence of the identified virus, but do not rule out bacterial infection or co-infection with other pathogens not detected by the test.  Clinical correlation with patient history and  other diagnostic information is necessary to determine patient infection status.  The expected result is negative.  Fact Sheet for Patients:   StrictlyIdeas.no   Fact Sheet for Healthcare Providers:   BankingDealers.co.za    This test is not yet approved or cleared by the [place] FDA and  has been authorized for detection and/or diagnosis of SARS-CoV-2 by FDA under an Emergency Use Authorization (EUA).  This EUA will remain in effect (meaning th is test can be used) for the duration of  the COVID-19 declaration under Section 564(b)(1) of the Act, 21 U.S.C. section 360-bbb-3(b)(1), unless the authorization is terminated or revoked sooner.  Performed at  [facility], [address] [place], [place] [address]   Urine culture     Status: Abnormal   Collection Time: 12/16/2020  9:08 PM   Specimen: Urine, Random  Result Value Ref  Range Status   Specimen Description   Final    URINE, RANDOM Performed at [facility], [address], [place], [place] [address]    Special Requests   Final    NONE Performed at [facility], [address] [place], [place] [address]    Culture (A)  Final    <10,000 COLONIES/mL INSIGNIFICANT GROWTH Performed at [facility], [place] [address], [place], [place] [address]    Report Status 01/02/2021 FINAL  Final  Blood culture (routine x 2)     Status: None   Collection Time: 01/01/2021 10:50 PM   Specimen: BLOOD  Result Value Ref Range Status   Specimen Description BLOOD RIGHT ASSIST CONTROL  Final   Special Requests   Final    BOTTLES DRAWN AEROBIC AND ANAEROBIC Blood Culture adequate volume   Culture   Final    NO GROWTH 5 DAYS Performed at [facility], [address], [place], [place] [address]    Report Status 01/05/2021 FINAL  Final  Blood culture (routine x 2)     Status: None   Collection Time: 01/02/2021 10:51 PM   Specimen: BLOOD  Result Value Ref Range Status   Specimen Description BLOOD LEFT ASSIST CONTROL  Final   Special Requests   Final    BOTTLES DRAWN AEROBIC AND ANAEROBIC Blood Culture adequate volume   Culture   Final    NO GROWTH 5 DAYS Performed at [facility], [address] [place], [place] [address]    Report Status 01/05/2021 FINAL  Final  Culture, respiratory (non-expectorated)     Status: None   Collection Time: 01/04/21 11:42 AM   Specimen: Tracheal Aspirate; Respiratory  Result Value Ref Range Status   Specimen Description   Final    TRACHEAL ASPIRATE Performed at [facility], [address] [place], [place] [address]    Special Requests   Final    Normal Performed at [facility], [place], [place] [address]    Gram Stain   Final    RARE WBC PRESENT,BOTH PMN AND MONONUCLEAR MODERATE YEAST FEW GRAM NEGATIVE RODS Performed at [facility], [place] [address], [place],  [address]    Culture   Final    RARE PSEUDOMONAS AERUGINOSA RARE CANDIDA ALBICANS    Report Status 01/07/2021 FINAL  Final   Organism ID, Bacteria PSEUDOMONAS AERUGINOSA  Final      Susceptibility   Pseudomonas aeruginosa - MIC*    CEFTAZIDIME <=1 SENSITIVE Sensitive     CIPROFLOXACIN 0.5 SENSITIVE Sensitive     GENTAMICIN 2 SENSITIVE Sensitive     IMIPENEM <=0.25 SENSITIVE Sensitive     PIP/TAZO <=4 SENSITIVE Sensitive     CEFEPIME 2 SENSITIVE Sensitive     * RARE PSEUDOMONAS AERUGINOSA          IMAGING    No results found.   Nutrition Status: Nutrition Problem: Inadequate oral intake Etiology: inability to eat (pt sedated and ventilated) Signs/Symptoms: NPO status       Indwelling Urinary Catheter continued, requirement due to   Reason to continue Indwelling Urinary Catheter strict Intake/Output monitoring for hemodynamic instability   Central Line/ continued, requirement due to  Reason to continue [employer]  Monitoring of central venous pressure or other hemodynamic parameters and poor IV access   Ventilator continued, requirement due to severe respiratory failure   Ventilator Sedation RASS 0 to -2      ASSESSMENT AND PLAN SYNOPSIS   85 y.o.femalewith Covid-19 pneumoniawith superimposed bacterial infectionPSEUDOMONAL PNEUMONIAintubated on 1/27 for worsening respiratory failure. SEPSIS present on admission  Failure to wean from vent, severe resp distress and failure Daughter wishes NOT to be present at time of vent weaning attempts   Acute hypoxemic respiratory failure due to COVID-19 pneumonia/ Mechanical ventilation via ARDS protocol, target PRVC 6 cc/kg Wean PEEP and FiO2 as able Goal plateau pressure less than 30, driving pressure less than 15 Paralytics if necessary for vent synchrony, gas exchange Diuresis as tolerated based on Kidney function VAP prevention order set IV STEROIDS Therapy Follow inflammatory  markers as needed with CRP Vitamin C, zinc Plan to repeat and check resp cultures as needed   Severe ACUTE Hypoxic and Hypercapnic Respiratory Failure -continue Full MV support -continue Bronchodilator Therapy -Wean Fio2 and PEEP as tolerated -VAP/VENT bundle implementation  ACUTE DIASTOLIC CARDIAC FAILURE-  -oxygen as needed -Lasix as tolerated   Morbid obesity, possible OSA.   Will certainly impact respiratory mechanics, ventilator weaning Suspect will need to consider additional PEEP   ACUTE KIDNEY INJURY/Renal Failure -continue Foley Catheter-assess need -Avoid nephrotoxic agents -Follow urine output, BMP -Ensure adequate renal perfusion, optimize oxygenation -Renal dose medications     NEUROLOGY Acute toxic metabolic encephalopathy, need for sedation Goal RASS -2 to -3   CARDIAC ICU monitoring  ID -continue IV abx as prescibed -follow up cultures  GI GI PROPHYLAXIS as indicated   DIET-->TF's as tolerated Constipation protocol as indicated  ENDO - will use ICU hypoglycemic\Hyperglycemia protocol if indicated    ELECTROLYTES -follow labs as needed -replace as needed -pharmacy consultation and following   DVT/GI PRX ordered and assessed TRANSFUSIONS AS NEEDED MONITOR FSBS I Assessed the need for Labs I Assessed the need for Foley I Assessed the need for Central Venous Line Family Discussion when available I Assessed the need for Mobilization I made an Assessment of medications to be adjusted accordingly Safety Risk assessment completed   CASE DISCUSSED IN MULTIDISCIPLINARY ROUNDS WITH ICU TEAM  Critical Care Time devoted to patient care services described in this note is 45 minutes.   Overall, patient is critically ill, prognosis is guarded.  Patient with Multiorgan failure and at high risk for cardiac arrest and death.    DNR status, END OF LIFE VISITATION, PLAN FOR CMO in next 24 hrs  Amaar Oshita Patricia Pesa, M.D.  [facility]  Medical Director [place] Director [facility] Cardio-Pulmonary Department

## 2021-01-11 DIAGNOSIS — J9602 Acute respiratory failure with hypercapnia: Secondary | ICD-10-CM

## 2021-01-11 LAB — GLUCOSE, CAPILLARY
Glucose-Capillary: 148 mg/dL — ABNORMAL HIGH (ref 70–99)
Glucose-Capillary: 180 mg/dL — ABNORMAL HIGH (ref 70–99)

## 2021-01-11 MED ORDER — GLYCOPYRROLATE 0.2 MG/ML IJ SOLN: 0.2000 mg | INTRAMUSCULAR | Status: DC | PRN

## 2021-01-11 MED ORDER — POLYVINYL ALCOHOL 1.4 % OP SOLN
1.0000 [drp] | Freq: Four times a day (QID) | OPHTHALMIC | Status: DC | PRN
Start: 2021-01-11 — End: 2021-01-12
  Filled 2021-01-11: qty 15

## 2021-01-11 MED ORDER — GLYCOPYRROLATE 1 MG PO TABS
1.0000 mg | ORAL_TABLET | ORAL | Status: DC | PRN
  Filled 2021-01-11: qty 1

## 2021-01-11 MED ORDER — MIDAZOLAM BOLUS VIA INFUSION
2.0000 mg | INTRAVENOUS | Status: DC | PRN
  Filled 2021-01-11: qty 2

## 2021-01-11 MED ORDER — FENTANYL BOLUS VIA INFUSION
50.0000 ug | INTRAVENOUS | Status: DC | PRN
Start: 2021-01-11 — End: 2021-01-12
  Administered 2021-01-11: 50 ug via INTRAVENOUS
  Filled 2021-01-11: qty 50

## 2021-01-17 ENCOUNTER — Ambulatory Visit: Payer: PPO | Admitting: Pharmacist

## 2021-01-17 ENCOUNTER — Ambulatory Visit: Payer: PPO | Admitting: Oncology

## 2021-01-17 ENCOUNTER — Other Ambulatory Visit: Payer: PPO

## 2021-02-05 NOTE — Progress Notes (Signed)
Daily Progress Note   Patient Name: Cynthia Wallace       Date: 02-09-2021 DOB: 1936/07/26  Age: 85 y.o. MRN#: [id number] Attending Physician: Flora Lipps, MD Primary Care Physician: Idelle Crouch, MD Admit Date: 12/27/2020  Reason for Consultation/Follow-up: Establishing goals of care  Subjective: Patient's family at bedside.  Awaiting arrival of patient's son in law.  They are prepared for transition to full comfort and extubation after he arrives.    Length of Stay: 11  Current Medications: Scheduled Meds:  . chlorhexidine gluconate (MEDLINE KIT)  15 mL Mouth Rinse BID  . Chlorhexidine Gluconate Cloth  6 each Topical Daily  . mouth rinse  15 mL Mouth Rinse 10 times per day    Continuous Infusions: . sodium chloride 250 mL (01/03/21 1835)  . fentaNYL infusion INTRAVENOUS 250 mcg/hr (02/09/2021 1310)  . midazolam 4 mg/hr (2021/02/09 1005)  . propofol (DIPRIVAN) infusion 25 mcg/kg/min (02-09-21 1011)    PRN Meds: acetaminophen, fentaNYL, glycopyrrolate **OR** glycopyrrolate **OR** glycopyrrolate, midazolam, polyvinyl alcohol  Physical Exam Vitals and nursing note reviewed.  Neurological:     Comments: unresponsive             Vital Signs: BP 134/76   Pulse (!) 108   Temp 100 F (37.8 C) (Oral)   Resp (!) 24   Ht 5' 5"  (1.651 m)   Wt 91.1 kg   SpO2 93%   BMI 33.42 kg/m  SpO2: SpO2: 93 % O2 Device: O2 Device: Ventilator O2 Flow Rate: O2 Flow Rate (L/min): 0 L/min  Intake/output summary:   Intake/Output Summary (Last 24 hours) at 02-09-2021 1420 Last data filed at 02-09-21 0720 Gross per 24 hour  Intake 3341.05 ml  Output 2150 ml  Net 1191.05 ml   LBM: Last BM Date: 01/09/21 Baseline Weight: Weight: 90.7 kg Most recent weight: Weight: 91.1 kg        Palliative Assessment/Data: PPS: 10%      Patient Active Problem List   Diagnosis Date Noted  . Acute respiratory failure with hypoxia ([place])   . Acute on chronic heart failure ([place])   . COVID-19   . Palliative care encounter   . Pneumonia due to COVID-19 virus 01/07/2021  . Combined hyperlipidemia 05/15/2020  . Pure hypercholesterolemia 05/15/2020  . Sick sinus syndrome ([place]) 03/10/2017  .  Chronic pulmonary hypertension ([place]) 10/08/2016  . MGUS (monoclonal gammopathy of unknown significance) 09/17/2016  . Thrombocytopenia ([place]) 09/14/2016  . Anemia, unspecified 08/12/2016  . HTN (hypertension) 04/21/2016  . Pneumonia 04/21/2016  . Enlarged heart 04/21/2016  . Severe tricuspid valve insufficiency 03/19/2016  . Hypoxia 03/07/2016  . Left lower lobe pneumonia 03/07/2016  . Dyspnea on exertion 03/07/2016  . Neuropathy 04/24/2015  . Bronchitis 04/10/2015  . Bacteremia 04/02/2015  . Cardiomyopathy ([place]) 04/01/2015  . Paroxysmal atrial fibrillation ([place]) 04/01/2015  . Pelvic pain 04/01/2015  . Localized edema 03/21/2015  . Chronic a-fib ([place]) 02/26/2015  . OSA on CPAP 09/13/2014    Palliative Care Assessment & Plan   Patient Profile: Cynthia J Harperis a 85 y.o.femalewith multiple medical problems including IgG myeloma, chronic anemia, history of CHF, A. fib on chronic anticoagulation, pulmonary hypertension, and CKD stage III. Patient was admitted to hospital 12/27/2020 with progressive shortness of breath and was found to have Covid pneumonia. Her hospitalization has been complicated by pseudomonal pneumonia, worsening respiratory failure requiringintubation.She has not been successfully weaning from ventilator and has had worsening respiratory status even with full vent support. Palliative care was consulted up address goals..  Assessment/Recommendations/Plan   Extubate to room air  Comfort measures  Comfort medications as ordered- patient currently has versed,  fentanyl, and propofol infusion- will continue these and titrate as needed for comfort  D/C interventions and medications that are not intended for comfort   Goals of Care and Additional Recommendations:  Limitations on Scope of Treatment: Full Comfort Care  Code Status:  DNR  Prognosis:   Unable to determine  Discharge Planning:  To Be Determined  Care plan was discussed with patient's family and care team.   Thank you for allowing the Palliative Medicine Team to assist in the care of this patient.   Total time: 26 minutes Greater than 50%  of this time was spent counseling and coordinating care related to the above assessment and plan.  Mariana Kaufman, AGNP-C Palliative Medicine   Please contact Palliative Medicine Team phone at [phone] for questions and concerns.

## 2021-02-05 NOTE — Progress Notes (Signed)
   2021/02/02 1330  Clinical Encounter Type  Visited With Patient not available   I attempted a visit, but patient was not available.

## 2021-02-05 NOTE — Progress Notes (Signed)
CRITICAL CARE NOTE 85 y.o.femalewith multiple medical comorbidities that include IgG myeloma, chronic anemia, chronic systolic CHF(unknwn LVEF)on chronic diuretics, atrial fibrillation on chronic anticoagulation,chronic pulmonary hypertension,CKD stage III,PPM,hypertension and hyperlipidemia. Chest x-ray was concerning for bilateral heterogeneous space opacities.  Transferred to ICU as stepdown and started on BPAP. Patient continued to desaturate with any movement. Had to bump up optiflow to 100%. Patient remains full code. Had extensive discussion with patient and daughter. Patient understands that prognosis likely remains poor. But she would like to do a trial of intubation and mechanical ventilation.   1/27admitted to ICU s/p intubation 1/31 failure to wean severe hypoxia 1/31 patient made DNR status 2/1 severe resp failure, remains on vent 2/2 severe resp failure failed weaning rials 2/3 severe resp failure, failed weaning trial  2021/01/31- plan for compasionate liberation from mechanical ventilation on comfort measures today per family requrest. I met with Cynthia Wallace and she does wish to wait for husband to come to bedside prior to starting extubation process and Comfort measures   CC  follow up respiratory failure  SUBJECTIVE Patient remains critically ill Prognosis is guarded   BP 124/68   Pulse 99   Temp 100 F (37.8 C) (Oral)   Resp (!) 24   Ht 5' 5"  (1.651 m)   Wt 91.1 kg   SpO2 94%   BMI 33.42 kg/m    I/O last 3 completed shifts: In: 3806.2 [I.V.:1332.9; NG/GT:2373.3; IV Piggyback:100] Out: 2197 [Urine:3850] Total I/O In: 209.7 [I.V.:209.7] Out: -   SpO2: 94 % O2 Flow Rate (L/min): 0 L/min FiO2 (%): 40 %  Estimated body mass index is 33.42 kg/m as calculated from the following:   Height as of this encounter: 5' 5"  (1.651 m).   Weight as of this encounter: 91.1 kg.  SIGNIFICANT EVENTS   REVIEW OF SYSTEMS  PATIENT IS UNABLE TO PROVIDE  COMPLETE REVIEW OF SYSTEMS DUE TO SEVERE CRITICAL ILLNESS       PHYSICAL EXAMINATION: GENERAL:critically ill appearing, +resp distress NECK: Supple.  PULMONARY: +rhonchi, +wheezing CARDIOVASCULAR: S1 and S2.  GASTROINTESTINAL: Soft, nontender, +Positive bowel sounds.  MUSCULOSKELETAL: No swelling, clubbing, or edema.  NEUROLOGIC: obtunded, GCS4t SKIN:intact,warm,dry     MEDICATIONS: I have reviewed all medications and confirmed regimen as documented   CULTURE RESULTS   Recent Results (from the past 240 hour(s))  Culture, respiratory (non-expectorated)     Status: None   Collection Time: 01/04/21 11:42 AM   Specimen: Tracheal Aspirate; Respiratory  Result Value Ref Range Status   Specimen Description   Final    TRACHEAL ASPIRATE Performed at [facility], [address] [place], [place] [address]    Special Requests   Final    Normal Performed at [facility], [place], [place] [address]    Gram Stain   Final    RARE WBC PRESENT,BOTH PMN AND MONONUCLEAR MODERATE YEAST FEW GRAM NEGATIVE RODS Performed at [facility], [place] [address], [place], [place] [address]    Culture   Final    RARE PSEUDOMONAS AERUGINOSA RARE CANDIDA ALBICANS    Report Status 01/07/2021 FINAL  Final   Organism ID, Bacteria PSEUDOMONAS AERUGINOSA  Final      Susceptibility   Pseudomonas aeruginosa - MIC*    CEFTAZIDIME <=1 SENSITIVE Sensitive     CIPROFLOXACIN 0.5 SENSITIVE Sensitive     GENTAMICIN 2 SENSITIVE Sensitive     IMIPENEM <=0.25 SENSITIVE Sensitive     PIP/TAZO <=4 SENSITIVE Sensitive  CEFEPIME 2 SENSITIVE Sensitive     * RARE PSEUDOMONAS AERUGINOSA          IMAGING    No results found.   Nutrition Status: Nutrition Problem: Inadequate oral intake Etiology: inability to eat (pt sedated and ventilated) Signs/Symptoms: NPO status       Indwelling Urinary Catheter continued, requirement due to   Reason to continue  Indwelling Urinary Catheter strict Intake/Output monitoring for hemodynamic instability   Central Line/ continued, requirement due to  Reason to continue [place] of central venous pressure or other hemodynamic parameters and poor IV access   Ventilator continued, requirement due to severe respiratory failure   Ventilator Sedation RASS 0 to -2      ASSESSMENT AND PLAN    85 y.o.femalewith Covid-19 pneumoniawith superimposed bacterial infectionPSEUDOMONAL PNEUMONIAintubated on 1/27 for worsening respiratory failure. SEPSIS present on admission  Failure to wean from vent, severe resp distress and failure Daughter wishes NOT to be present at time of vent weaning attempts   Acute hypoxemic respiratory failure due to COVID-19 pneumonia/ Mechanical ventilation via ARDS  Acute COVID19 pneumonia -Remdesevir antiviral - pharmacy protocol 5 d -vitamin C -zinc -decadron 5m IV daily  -Diuresis - Lasix 40 IV daily - monitor UOP - utilize external urinary catheter if possible -prone if patient can tolerate  -encourage to use IS and Acapella device for bronchopulmonary hygiene when able -consider Actemra if CRP increments >20 -d/c hepatotoxic medications while on remdesevir -supportive care with ICU telemetry monitoring -PT/OT when possible -procalcitonin, CRP and ferritin trending    Severe ACUTE Hypoxic and Hypercapnic Respiratory Failure -continue Full MV support -continue Bronchodilator Therapy -Wean Fio2 and PEEP as tolerated -VAP/VENT bundle implementation  ACUTE DIASTOLIC CARDIAC FAILURE-  -oxygen as needed -Lasix as tolerated   Morbid obesity, possible OSA.   Will certainly impact respiratory mechanics, ventilator weaning Suspect will need to consider additional PEEP   ACUTE KIDNEY INJURY/Renal Failure -continue Foley Catheter-assess need -Avoid nephrotoxic agents -Follow urine output, BMP -Ensure adequate renal perfusion, optimize  oxygenation -Renal dose medications     NEUROLOGY Acute toxic metabolic encephalopathy, need for sedation Goal RASS -2 to -3   CARDIAC ICU monitoring  ID -continue IV abx as prescibed -follow up cultures  GI GI PROPHYLAXIS as indicated   DIET-->TF's as tolerated Constipation protocol as indicated  ENDO - will use ICU hypoglycemic\Hyperglycemia protocol if indicated    ELECTROLYTES -follow labs as needed -replace as needed -pharmacy consultation and following   DVT/GI PRX ordered and assessed TRANSFUSIONS AS NEEDED MONITOR FSBS I Assessed the need for Labs I Assessed the need for Foley I Assessed the need for Central Venous Line Family Discussion when available I Assessed the need for Mobilization I made an Assessment of medications to be adjusted accordingly Safety Risk assessment completed   CASE DISCUSSED IN MULTIDISCIPLINARY ROUNDS WITH ICU TEAM  Critical care provider statement:    Critical care time (minutes):  34   Critical care time was exclusive of:  Separately billable procedures and  treating other patients   Critical care was necessary to treat or prevent imminent or  life-threatening deterioration of the following conditions:  acute hypoxemic respiratory failure due to [place]Indian [place]care was time spent personally by me on the following  activities:  Development of treatment plan with patient or surrogate,  discussions with consultants, evaluation of patient's response to  treatment, examination of patient, obtaining history from patient or  surrogate, ordering and performing treatments and interventions,  ordering  and review of laboratory studies and re-evaluation of patient's condition   I assumed direction of critical care for this patient from another  provider in my specialty: no     Cynthia Wallace, M.D.  Pulmonary & [place]

## 2021-02-05 NOTE — Progress Notes (Signed)
Present with family who decided for comfort care this morning. Their Cynthia Wallace was available, providing comfort and prayer to family. Last visit @2 :00 PM patient had been extubated, family at bedside.

## 2021-02-05 NOTE — Progress Notes (Signed)
[facility] notified of TOD. Not suitable for any donation.

## 2021-02-05 NOTE — Progress Notes (Signed)
[facility] notified of terminal extubation to comfort care. Referral # [id number]. Bedside RN Angela Nevin notified to call with cardiac TOD.

## 2021-02-05 NOTE — Death Summary Note (Signed)
DEATH SUMMARY   Patient Details  Name: Cynthia Wallace MRN: [id number] DOB: 1936-05-28  Admission/Discharge Information   Admit Date:  Jan 19, 2021  Date of Death:  Jan 30, 2021  Time of Death:  15:42  Length of Stay: 11  Referring Physician: Idelle Crouch, MD   Reason(s) for Hospitalization  Acute Hypoxic Respiratory Failure COVID-19 Pneumonia Superimposed Bacterial (Pseudomonal) Pneumonia Septic Shock Acute on Chronic HFpEF Acute Kidney Injury Acute Metabolic Encephalopathy Chronic Atrial Fibrillation Chronic Anemia Pancytopenia  Diagnoses  Preliminary cause of death:   Acute Hypoxic Respiratory Failure / COVID-19 Pneumonia Secondary Diagnoses (including complications and co-morbidities):  Active Problems:   Pneumonia due to COVID-19 virus   Acute on chronic heart failure ([place])   COVID-19   Palliative care encounter   Acute respiratory failure with hypoxia (HCC) Superimposed Bacterial (Pseudomonal) Pneumonia Septic Shock Acute Kidney Injury Acute Metabolic Encephalopathy Acute on Chronic HFpEF Chronic Atrial Fibrillation Chronic Anemia Pancytopenia  Brief Hospital Course (including significant findings, care, treatment, and services provided and events leading to death)  Cynthia J Harperis an 85 y.o.femalewith multiple medical comorbidities that include IgG myeloma, chronic anemia, chronic systolic CHF(unknwn LVEF)on chronic diuretics, atrial fibrillation on chronic anticoagulation,chronic pulmonary hypertension,CKD stage III, PPM,hypertension and hyperlipidemia. As per patient, over the past several months, she has had progressive worsening shortness of breath from her baseline,however over the past few days, she has felt extremely weak and tired with minimal exertion. She has had poor oral intake and poor appetite over the past few days. She is up-to-date with her vaccinations. Family decided to bring patient into the emergency room on 2021-01-19 to be further  evaluated after some concerns about altered mental status from her baseline. No reported falls at home. Denies any chest pain or palpitations. Denies fever or chills. On presentation, patient was noted to be positive for Covid. Due to patient being a poor historian and difficult to assess duration of hersymptoms. Chest x-ray was concerning for bilateral heterogeneous space opacities."  Hospital Course: CT-PA on 01/02/2021 showed no evidence of PE. Patient treated for COVID 19 pneumonia and superimposed bacterial pneumonia. Was started on 6L HFNC on 01/02/2021 and then transitioned to 50L HFNC this morning. Transferred to ICU as stepdown and started on BPAP. Patient continued to desaturate with any movement. Had to bump up optiflow to 100%. Patient remains full code. Had extensive discussion with patient and daughter. Patient understands that prognosis likely remains poor. But she would like to do a trial of intubation and mechanical ventilation. Intubated the evening of 01/03/2021.   In the following days she exhibited severe hypoxia and respiratory failure, and was unable to wean from the ventilator.  Palliative Care was consulted to assist with goals of care, of which she was made a DNR.  On Jan 30, 2021 pt's family elected to transition to [place] and withdraw all life sustaining measures.  She expired shortly after care withdrawn.    Pertinent Labs and Studies  Significant Diagnostic Studies DG Chest 2 View  Result Date: 01-19-21 CLINICAL DATA:  Shortness of breath EXAM: CHEST - 2 VIEW COMPARISON:  11/09/2020 FINDINGS: Cardiomegaly left chest multi lead pacer. Scattered bilateral heterogeneous airspace opacities, improved compared to prior examination. The visualized skeletal structures are unremarkable. IMPRESSION: 1. Scattered bilateral heterogeneous airspace opacities, improved compared to prior examination, and consistent with improved infection and/or edema. 2.  Cardiomegaly. Electronically  Signed   By: Eddie Candle M.D.   On: Jan 19, 2021 19:25   DG Abd 1 View  Result Date: 01/03/2021  CLINICAL DATA:  Intubation, OG tube EXAM: ABDOMEN - 1 VIEW COMPARISON:  None. FINDINGS: OG tube tip is in the mid stomach. Nonobstructive bowel gas pattern. IMPRESSION: OG tube tip in the mid stomach. Electronically Signed   By: Rolm Baptise M.D.   On: 01/03/2021 18:21   CT Head Wo Contrast  Result Date: 01/04/2021 CLINICAL DATA:  Mental status changes EXAM: CT HEAD WITHOUT CONTRAST TECHNIQUE: Contiguous axial images were obtained from the base of the skull through the vertex without intravenous contrast. COMPARISON:  09/25/2016 FINDINGS: Brain: There is atrophy and chronic small vessel disease changes. No acute intracranial abnormality. Specifically, no hemorrhage, hydrocephalus, mass lesion, acute infarction, or significant intracranial injury. Vascular: No hyperdense vessel or unexpected calcification. Skull: No acute calvarial abnormality. Sinuses/Orbits: Air-fluid levels in the maxillary sinuses and sphenoid sinuses. Mucosal thickening throughout the ethmoid air cells. Other: None IMPRESSION: Atrophy, chronic microvascular disease. No acute intracranial abnormality. Acute on chronic pan sinusitis. Electronically Signed   By: Rolm Baptise M.D.   On: 12/29/2020 21:47   CT ANGIO CHEST PE W OR WO CONTRAST  Result Date: 01/02/2021 CLINICAL DATA:  COVID-19 positive, pneumonia, worsening hypoxia EXAM: CT ANGIOGRAPHY CHEST WITH CONTRAST TECHNIQUE: Multidetector CT imaging of the chest was performed using the standard protocol during bolus administration of intravenous contrast. Multiplanar CT image reconstructions and MIPs were obtained to evaluate the vascular anatomy. CONTRAST:  61mL OMNIPAQUE IOHEXOL 350 MG/ML SOLN COMPARISON:  12/15/2020, 11/24/2017 FINDINGS: Cardiovascular: This is a technically adequate evaluation of the pulmonary vasculature. No filling defects or pulmonary emboli. The heart is enlarged,  with significant biatrial dilatation right greater than left. Dual lead pacemaker is noted, lead within the right atrium and right ventricle. No pericardial effusion. No evidence of thoracic aortic aneurysm or dissection. Mild atherosclerosis of the aorta. Severe coronary artery atherosclerosis unchanged. Mediastinum/Nodes: No enlarged mediastinal, hilar, or axillary lymph nodes. Thyroid gland, trachea, and esophagus demonstrate no significant findings. Lungs/Pleura: There is significant multifocal bilateral airspace disease, which has progressed since the 12/30/2020 x-ray. The most dense consolidation is seen within the lung bases. There are trace bilateral pleural effusions. No pneumothorax. The central airways are patent. Upper Abdomen: Significant reflux of contrast into the hepatic veins suggests cardiac dysfunction. No acute upper abdominal process. Musculoskeletal: No acute or destructive bony lesions. Reconstructed images demonstrate no additional findings. Review of the MIP images confirms the above findings. IMPRESSION: 1. No evidence of pulmonary embolus. 2. Progressive widespread bilateral airspace disease, with associated cardiomegaly and trace bilateral pleural effusions. Pattern of findings suggest congestive heart failure. Superimposed infection cannot be excluded. 3. Aortic Atherosclerosis (ICD10-I70.0). Coronary artery atherosclerosis. Electronically Signed   By: Randa Ngo M.D.   On: 01/02/2021 17:08   DG Chest Port 1 View  Result Date: 01/06/2021 CLINICAL DATA:  Acute respiratory failure, asthma, CHF, hypertension, CAD. EXAM: PORTABLE CHEST 1 VIEW COMPARISON:  Chest x-ray 01/03/2021 FINDINGS: Endotracheal tube with tip 3 cm above the carina. Enteric tube coursing below the hemidiaphragm with tip and side port collimated off view. Right internal jugular venous catheter with tip overlying the right atrium. Left chest wall 2 lead cardiac pacemaker in similar position. Cardiomegaly. The heart  size and mediastinal contours are unchanged. Aortic arch calcification. Persistent diffuse patchy airspace opacities with slight improved aeration of the lungs. No pulmonary edema. No pleural effusion. No pneumothorax. No acute osseous abnormality. IMPRESSION: 1. Multifocal pneumonia with slightly improved aeration of the lungs. 2. Lines and tubes are stable. Electronically Signed   By: Thomasena Edis  Mckinley Jewel M.D.   On: 01/06/2021 05:38   DG Chest Port 1 View  Result Date: 01/03/2021 CLINICAL DATA:  85 year old female status post intubation. EXAM: PORTABLE CHEST 1 VIEW COMPARISON:  Chest CT dated 01/02/2021. FINDINGS: Endotracheal tube with tip approximately 2.5 cm above the carina. Enteric tube extends below the diaphragm with tip beyond the inferior margin of the image. Right IJ central venous line with tip over the right atrium close to the cavoatrial junction. Bilateral pulmonary opacities consistent with multifocal pneumonia. No pleural effusion pneumothorax. Mild cardiomegaly. Left pectoral pacemaker device. No acute osseous pathology. Degenerative changes of the spine. IMPRESSION: 1. Endotracheal tube above the carina. 2. Bilateral pulmonary opacities consistent with multifocal pneumonia. Electronically Signed   By: Anner Crete M.D.   On: 01/03/2021 18:13   ECHOCARDIOGRAM COMPLETE  Result Date: 01/01/2021    ECHOCARDIOGRAM REPORT   Patient Name:   Roann MONETTE CHAMU Date of Exam: 01/01/2021 Medical Rec #:  [id number]      Height:       65.0 in Accession #:    [id number]     Weight:       200.0 lb Date of Birth:  02/23/1936      BSA:          1.978 m Patient Age:    20 years       BP:           110/58 mmHg Patient Gender: F              HR:           74 bpm. Exam Location:  [facility] Procedure: 2D Echo, Color Doppler and Cardiac Doppler Indications:     I48.91 Atrial Fibrillation  History:         Patient has no prior history of Echocardiogram examinations.                  CHF and Cardiomyopathy, CAD; Risk  Factors:Sleep Apnea,                  Hypertension and Dyslipidemia. Pt tested positive for COVID-19                  on 12/30/2020.  Sonographer:     Charmayne Sheer RDCS (AE) Referring Phys:  [id number] [place] Diagnosing Phys: Bartholome Bill MD  Sonographer Comments: Suboptimal apical window. IMPRESSIONS  1. Left ventricular ejection fraction, by estimation, is 65 to 70%. Left ventricular ejection fraction by PLAX is 71 %. The left ventricle has normal function. The left ventricle has no regional wall motion abnormalities. Left ventricular diastolic parameters were normal.  2. Right ventricular systolic function is normal. The right ventricular size is normal.  3. Left atrial size was mildly dilated.  4. The mitral valve is grossly normal. Moderate mitral valve regurgitation.  5. Tricuspid valve regurgitation is moderate to severe.  6. The aortic valve is grossly normal. Aortic valve regurgitation is trivial. FINDINGS  Left Ventricle: Left ventricular ejection fraction, by estimation, is 65 to 70%. Left ventricular ejection fraction by PLAX is 71 %. The left ventricle has normal function. The left ventricle has no regional wall motion abnormalities. The left ventricular internal cavity size was normal in size. There is no left ventricular hypertrophy. Left ventricular diastolic parameters were normal. Right Ventricle: The right ventricular size is normal. No increase in right ventricular wall thickness. Right ventricular systolic function is normal. Left Atrium: Left atrial size was mildly dilated. Right Atrium:  Right atrial size was normal in size. Pericardium: There is no evidence of pericardial effusion. Mitral Valve: The mitral valve is grossly normal. Moderate mitral valve regurgitation. MV peak gradient, 10.1 mmHg. The mean mitral valve gradient is 5.0 mmHg. Tricuspid Valve: The tricuspid valve is not well visualized. Tricuspid valve regurgitation is moderate to severe. Aortic Valve: The aortic valve is  grossly normal. Aortic valve regurgitation is trivial. Aortic valve mean gradient measures 7.0 mmHg. Aortic valve peak gradient measures 13.8 mmHg. Aortic valve area, by VTI measures 1.39 cm. Pulmonic Valve: The pulmonic valve was not well visualized. Pulmonic valve regurgitation is trivial. Aorta: The aortic root is normal in size and structure. IAS/Shunts: No atrial level shunt detected by color flow Doppler.  LEFT VENTRICLE PLAX 2D LV EF:         Left            Diastology                ventricular     LV e' medial:    7.94 cm/s                ejection        LV E/e' medial:  16.8                fraction by     LV e' lateral:   11.90 cm/s                PLAX is 71      LV E/e' lateral: 11.2                %. LVIDd:         4.90 cm LVIDs:         2.90 cm LV PW:         1.20 cm LV IVS:        1.00 cm LVOT diam:     1.70 cm LV SV:         41 LV SV Index:   21 LVOT Area:     2.27 cm  RIGHT VENTRICLE RV Basal diam:  4.40 cm LEFT ATRIUM         Index      RIGHT ATRIUM           Index LA diam:    5.20 cm 2.63 cm/m RA Area:     27.20 cm                                RA Volume:   84.10 ml  42.51 ml/m  AORTIC VALVE                    PULMONIC VALVE AV Area (Vmax):    1.27 cm     PV Vmax:       1.42 m/s AV Area (Vmean):   1.29 cm     PV Vmean:      91.500 cm/s AV Area (VTI):     1.39 cm     PV VTI:        0.280 m AV Vmax:           186.00 cm/s  PV Peak grad:  8.1 mmHg AV Vmean:          120.000 cm/s PV Mean grad:  4.0 mmHg AV VTI:            0.295 m AV  Peak Grad:      13.8 mmHg AV Mean Grad:      7.0 mmHg LVOT Vmax:         104.00 cm/s LVOT Vmean:        68.400 cm/s LVOT VTI:          0.181 m LVOT/AV VTI ratio: 0.61  AORTA Ao Root diam: 2.90 cm MITRAL VALVE                TRICUSPID VALVE MV Area (PHT): 2.94 cm     TR Peak grad:   28.1 mmHg MV Area VTI:   1.43 cm     TR Vmax:        265.00 cm/s MV Peak grad:  10.1 mmHg MV Mean grad:  5.0 mmHg     SHUNTS MV Vmax:       1.59 m/s     Systemic VTI:  0.18 m MV Vmean:       107.0 cm/s   Systemic Diam: 1.70 cm MV Decel Time: 258 msec MV E velocity: 133.67 cm/s Bartholome Bill MD Electronically signed by Bartholome Bill MD Signature Date/Time: 01/01/2021/4:36:21 PM    Final     Microbiology Recent Results (from the past 240 hour(s))  Culture, respiratory (non-expectorated)     Status: None   Collection Time: 01/04/21 11:42 AM   Specimen: Tracheal Aspirate; Respiratory  Result Value Ref Range Status   Specimen Description   Final    TRACHEAL ASPIRATE Performed at [facility], [address] [place], [place] [address]    Special Requests   Final    Normal Performed at [facility], [place], [place] [address]    Gram Stain   Final    RARE WBC PRESENT,BOTH PMN AND MONONUCLEAR MODERATE YEAST FEW GRAM NEGATIVE RODS Performed at [facility], [place] [address], [place], [place] [address]    Culture   Final    RARE PSEUDOMONAS AERUGINOSA RARE CANDIDA ALBICANS    Report Status 01/07/2021 FINAL  Final   Organism ID, Bacteria PSEUDOMONAS AERUGINOSA  Final      Susceptibility   Pseudomonas aeruginosa - MIC*    CEFTAZIDIME <=1 SENSITIVE Sensitive     CIPROFLOXACIN 0.5 SENSITIVE Sensitive     GENTAMICIN 2 SENSITIVE Sensitive     IMIPENEM <=0.25 SENSITIVE Sensitive     PIP/TAZO <=4 SENSITIVE Sensitive     CEFEPIME 2 SENSITIVE Sensitive     * RARE PSEUDOMONAS AERUGINOSA    Lab Basic Metabolic Panel: Recent Labs  Lab 01/05/21 0436 01/06/21 0428 01/07/21 0521 01/09/21 0454 01/10/21 0425  NA 147* 150* 153* 158* 159*  K 4.2 4.4 4.7 4.9 5.2*  CL 112* 116* 116* 121* 121*  CO2 25 27 28 30 30   GLUCOSE 174* 158* 178* 200* 222*  BUN 72* 86* 84* 81* 78*  CREATININE 1.45* 1.46* 1.32* 0.95 1.01*  CALCIUM 7.6* 8.0* 8.6* 8.3* 8.4*  MG 3.1* 3.2* 3.3* 3.3* 3.3*  PHOS 3.0 2.7 3.2 2.6 3.5   Liver Function Tests: Recent Labs  Lab 01/05/21 0436 01/09/21 0454 01/10/21 0425  AST 28  --   --   ALT 21  --   --   ALKPHOS 58   --   --   BILITOT 0.8  --   --   PROT 6.9  --   --   ALBUMIN 2.4* 1.8* 1.7*   No results for input(s): LIPASE, AMYLASE in the last 168 hours. No results  for input(s): AMMONIA in the last 168 hours. CBC: Recent Labs  Lab 01/05/21 0436 01/06/21 0428 01/06/21 1410 01/07/21 0521  WBC 3.0* 1.9*  --  2.4*  NEUTROABS 2.1  --   --  1.2*  HGB 7.3* 6.8* 7.5* 8.0*  HCT 23.9* 21.7* 23.5* 24.5*  MCV 106.7* 102.8*  --  100.0  PLT 151 130*  --  122*   Cardiac Enzymes: No results for input(s): CKTOTAL, CKMB, CKMBINDEX, TROPONINI in the last 168 hours. Sepsis Labs: Recent Labs  Lab 01/05/21 0436 01/06/21 0428 01/07/21 0521  WBC 3.0* 1.9* 2.4*    Procedures/Operations  1/27: Endotracheal intubation 1/27: Right IJ CVC placed          Darel Hong, [facility] Pager: [phone]  Bradly Bienenstock 01/12/2021, 1:35 AM

## 2021-02-05 DEATH — deceased

## 2021-03-13 NOTE — Progress Notes (Signed)
[facility] OFFICE  [address] [place], [place] [address]     Clearance Coots, [place]    Date of Visit:  07/03/2015  Date of Birth: 1935/12/10  Age: 85 yrs.   Medical Record Number: [id number]  __  CURRENT DIAGNOSES     1. Mitral valve insufficiency nonrheumatic,  I34.0  2. Atrial Fibrillation, Chronic, I48.2  3. Status post Pacemaker, Z95.0  __  ALLERGIES     Codeine, Intolerance-unknown  __  MEDICATIONS     1. albuterol sulfate HFA 90 mcg/actuation  aerosol inhaler, PRN  2. atorvastatin 10 mg tablet, 1 po qd  3. biotin 1,000 mcg chewable tablet, 1 po tid  4. cyclobenzaprine 10 mg tablet, Take as Directed  5. Dulera 200 mcg-5 mcg/actuation HFA aerosol inhaler, Take as Directed   6. losartan 25 mg tablet, 1 po qd  7. metoprolol succinate ER 50 mg tablet,extended release 24 hr, 1 po bid  8. Xarelto 20 mg tablet, 1 po qd  9. gabapentin 100 mg capsule, 1 po tid  10. furosemide 20 mg tablet, 1 po qd  11. diazepam  5 mg tablet, 3 tims per week  12. digoxin 125 mcg tablet, 1 po qd  __  CHIEF COMPLAINT/REASON FOR VISIT  Followup of s/p PPM, Atrial fibrillation,  LE edema  __  HISTORY OF PRESENT ILLNESS  Bethany Howell comes in for a followup. She was admitted in late April. She was seen in consultation. An  echocardiogram done at that time showed preserved left ventricular systolic function, moderate-to-severe tricuspid regurgitation with borderline pulmonary artery pressures, moderate mitral regurgitation. She has a history of a permanent pacemaker with  a generator change [place] this [place]. She had a remote pacemaker check in June which apparently was normal. She has got sleep apnea on CPAP therapy. She has had a history of venous insufficiency with apparently what sounds like  a failed ablation. I am not sure if that was a radiofrequency ablation or a laser attempt. She comes in basically now to the office to establish local care. No breathing difficulties, no chest pain, no paroxysmal  nocturnal dyspnea, no orthopnea. She does  wear her CPAP regularly. Her biggest complaint is just very extensive lower extremity edema about 2+ pitting to both legs up to the knees. She does take furosemide. She also takes Digoxin for her atrial fibrillation. She has got a ventricular paced rhythm  on her ECG. She was taking furosemide twice a day. She did not see any significant improvement in the lower extremity edema. She cut back to once daily. Blood pressures are adequately controlled in the 130s.  __   PAST HISTORY     Past Medical Illnesses: Hyperlipidemia, Hypertension, DJD, Basal Cell Carcinoma,  Sleep Apnea;  Past Cardiac Illnesses: Atrial fibrillation, Cardiomyopathy, PPM (2007);  Infectious Diseases: No previous history of significant infectious diseases.; Surgical Procedures: Left  Eye Surg 2013, Right Eye Surg 2016, Right TKR 2010, Hysterectomy; Trauma History: No previous history of significant trauma.;  Cardiology Procedures-Invasive: PPM Battery Replaced 03/2015; Cardiology Procedures-Noninvasive: Echocardiogram  April 2016; Left Ventricular Ejection Fraction: LVEF of 60% documented via echocardiogram on 04/03/2015   ___  FAMILY HISTORY  Brother -- Myocardial infarction   Father -- Myocardial infarction  Mother -- Myocardial infarction, Hypertension   Sister -- Cancer    __  CARDIAC RISK FACTORS      Tobacco Abuse: has never used tobacco; Family History of Heart Disease: positive;  Hyperlipidemia: positive; Hypertension:  positive;   Diabetes Mellitus: negative; Prior History of Heart Disease: negative;  Obesity: BMI30 (Obesity); Sedentary Life Style:negative;  ZOX:[id number]; Menopausal:surgical menopause  __   SOCIAL HISTORY    Alcohol Use: Does not use alcohol;  Smoking: Does not smoke; Never smoker ([id number]); Diet: Regular diet without modifications and Caffeine  use-1-2 per day; Exercise: No regular exercise;   __  PHYSICAL EXAMINATION     Vital Signs:  Blood Pressure:  134/84 Sitting, Left arm,  large cuff  130/84 Sitting, Right arm,  large cuff    Weight: 216.00 lbs.  Height: 66"   BMI: 35   Pulse: 78/min. Apical Regular   Respirations:  18/min. regular and relaxed      Constitutional: Cooperative, alert and oriented,well developed,  well nourished, in no acute distress., mildly overweight Skin: Warm and dry to touch, no apparent skin lesions, or masses noted.  Head: Normocephalic, normal hair pattern, no masses or tenderness Eyes : Funduscopic exam and visual fields not performed ENT: Ears, Nose and throat reveal no gross abnormalities.  No pallor or cyanosis. Dentition good. Neck: No palpable masses or adenopathy, no thyromegaly, no JVD,  carotid pulses are full and equal bilaterally without bruits. Chest: Normal symmetry, no tenderness to palpation, normal respiratory excursion, no intercostal  retraction, no use of accessory muscles, normal diaphragmatic excursion, clear to auscultation and percussion. Cardiac: Regular rate and rhythm, II/VI  mid systolic murmur Abdomen: Abdomen soft, bowel sounds normoactive, no masses, no hepatosplenomegaly, non-tender, no bruits  Peripheral Pulses: 2+ radial pulses Extremities/Back:  Prominent spider veins and telengectasias; 2+ pitting edema Neurological: No gross motor or sensory deficits  noted, affect appropriate, oriented to time, person and place.   __    Medications added today by the physician:  digoxin 125 mcg tablet, 1  po qd, 90    ECG:  Electronic ventricular pacemaker. No further analysis.     ASSESSMENT  AND PLAN:  1. Chronic atrial fibrillation. She is systemically anticoagulated with Xarelto. She takes Digoxin  as well as metoprolol.   2. Status post permanent pacemaker. Generator change in  April in [place], remote  followup in June, all of which were stable according to her. She will continue with the remote  monitoring through her [facility] cardiologists office.   3. Lower extremity edema. I suspect it is likely  related to venous  insufficiency. She does have  evidence of moderate-to-severe tricuspid regurgitation though the pulmonary artery pressures  were not elevated. She is on low-dose diuretic therapy. I have given her a prescription for  compression  stockings. I have asked that she get them custom made and measured.  Hopefully they will be more tolerable. Previously she was unable to tolerate them as they were  quite tight and pinching below the knee level. No further recommendations. Follow  up in six  months.     Exie Parody, MD, FACC, FSCAI    RG/tucbh    TPA  ____________________________   Christianne Dolin  Return Visit 15 MIN 6 months  12 Lead ECG Today  Diet mgmt edu, guidance and counseling TODAY

## 2021-03-13 NOTE — Progress Notes (Signed)
[facility] OFFICE  [address] [facility] [place], [place] [address]     Clearance Bethany Howell, Bethany Howell    Date of Visit:  03/04/2016  Date of Birth: [birth year]  Age: 85 yrs.   Medical Record Number: [id number]  __  CURRENT DIAGNOSES     1. Sleep apnea, obstructive, G47.33   2. Status post Pacemaker, Z95.0  3. Hypertension (essential or benign or malignant), I10  4. Mitral valve insufficiency nonrheumatic, I34.0  5. Atrial Fibrillation, Chronic, I48.2  __   ALLERGIES    Codeine, Intolerance-unknown  __   MEDICATIONS     1. albuterol sulfate HFA 90 mcg/actuation aerosol inhaler, PRN  2. atorvastatin 10 mg tablet, 1 po qd  3. biotin 1,000 mcg chewable tablet, 1 po tid  4. losartan 25 mg  tablet, 1 po qd  5. metoprolol succinate ER 50 mg tablet,extended release 24 hr, 1 po bid  6. Xarelto 20 mg tablet, 1 po qd  7. gabapentin 100 mg capsule, 1 po tid  8. digoxin 125 mcg tablet, 1 po qd  9. Symbicort 80 mcg-4.5 mcg/actuation  HFA aerosol inhaler, Take as Directed  10. potassium chloride ER 10 mEq tablet,extended release, 1 po qd  11. ProAir HFA 90 mcg/actuation aerosol inhaler, use as Directed  12. furosemide 20 mg tablet, 2 po qd  __   CHIEF COMPLAINT/REASON FOR VISIT  Followup of Atrial fibrillation, s/p PPM, LE edema, Shortness of breath, OSA, Hypertension  __  HISTORY OF PRESENT  ILLNESS  Ms. Qazi comes in for a followup visit accompanied by her daughter. She was last seen in July. She did not tolerate the compression stockings as they were custom made but just too tight around  her legs below the knee, causing a band-like discomfort. She comes in now with increasing weight, increasing shortness of breath. She is up about 12 pounds by our scales. Blood pressure is 130s over 90s today, typically 110s to 120s at home. She is compliant  with her CPAP. The right leg is worse. That is the leg of the total knee replacement. She denies any symptoms suggestive of decompensated heart failure but again just in general not feeling well.  She is on Symbicort and ProAir with no significant improvement  in her breathing. Potassium has been added to her furosemide. She still remains on low-dose Lasix 20 mg per day.   __  PAST HISTORY      Past Medical Illnesses: Hyperlipidemia, Hypertension, DJD, Basal Cell Carcinoma, Sleep Apnea;  Past Cardiac  Illnesses: Atrial fibrillation, Cardiomyopathy, PPM (2007); Infectious Diseases: No previous history of  significant infectious diseases.; Surgical Procedures: Left Eye Surg 2013, Right Eye Surg 2016, Right TKR 2010, Hysterectomy;  Trauma History: No previous history of significant trauma.; Cardiology Procedures-Invasive: PPM Battery  Replaced 03/2015; Cardiology Procedures-Noninvasive: Echocardiogram April 2016; Left Ventricular Ejection  Fraction: LVEF of 60% documented via echocardiogram on 04/03/2015  ___   FAMILY HISTORY  Brother -- Myocardial infarction   Father -- Myocardial infarction  Mother -- Myocardial infarction, Hypertension   Sister -- Cancer    __  CARDIAC RISK FACTORS      Tobacco Abuse: has never used tobacco; Family History of Heart Disease: positive;  Hyperlipidemia: positive; Hypertension: positive;   Diabetes Mellitus: negative; Prior History of Heart Disease: negative;  Obesity: BMI30 (Obesity); Sedentary Life Style:negative;  GEX:BMWUXLKG; Menopausal:surgical menopause  __   SOCIAL HISTORY    Alcohol Use: Does not use alcohol;  Smoking: Does not smoke; Never smoker ([id number]); Diet: Regular  diet without modifications and Caffeine  use-1-2 per day; Exercise: No regular exercise;   __  PHYSICAL EXAMINATION     Vital Signs:  Blood Pressure:  138/92 Sitting, Left arm, large cuff     Weight: 228.00 lbs.  Height: 66"  BMI:  37   Pulse: 88/min. Apical       Constitutional:  Cooperative, alert and oriented,well developed, well nourished, in no acute distress., mildly overweight Skin: Warm and dry to touch, no apparent skin  lesions, or masses noted. Head: Normocephalic, normal hair pattern, no  masses or tenderness  Eyes: Funduscopic exam and visual fields not performed ENT:  Ears, Nose and throat reveal no gross abnormalities. No pallor or cyanosis. Dentition good. Neck: No  palpable masses or adenopathy, no thyromegaly, no JVD, carotid pulses are full and equal bilaterally without bruits. Chest: bibasilar rales  Cardiac: Regular rate and rhythm, II/VI mid systolic murmur Abdomen: Abdomen soft, bowel sounds normoactive,  no masses, no hepatosplenomegaly, non-tender, no bruits Peripheral Pulses: 2+ radial pulses  Extremities/Back: Prominent spider veins and telengectasias; 2+ pitting edema Neurological:  No gross motor or sensory deficits noted, affect appropriate, oriented to time, person and place.   __    Medications added today by the physician:   furosemide 20 mg tablet, 2 po qd, 60    ASSESSMENT AND PLAN:  1. Atrial fibrillation, systemically anticoagulated with Xarelto, status post permanent  pacemaker.   She is also on digoxin as well as metoprolol succinate.   2. Shortness of breath. Unclear of the etiology, if it obesity, deconditioning, possibly some heart   failure. She does have significant valvular regurgitation as documented  by an echo in the   hospital in April of 2016. The plan at this time is to increase her diuretic to Lasix 40 mg per day,   get a BMP in a week. Repeat the echo to reassess her valvular regurgitation as well as   pulmonary artery pressures.    3. Sleep apnea. She is compliant with her CPAP. It was last assessed in October of 2015. She   will come back for a titration study just to ensure she is getting adequate positive airway   pressure.   4. Lower extremity edema. She apparently  had a venous procedure that was a failed procedure in   [place]. She has clear-cut venous stasis changes. We will plan to repeat a venous   study in the next couple of weeks.     RECOMMENDATIONS:   Echo, CPAP titration study, venous insufficiency study, increase furosemide to 40 mg per  day with a BMP in a week. Follow up in three weeks, assuming all of her diagnostic studies have been completed in that time.     Exie Parody, MD, FACC, FSCAI     RG/tumam     EL   ____________________________  TODAYS ORDERS  Diet mgmt edu, guidance and counseling TODAY  2D, color flow, doppler  1 week  LE Venous Doppler-Insufficiency 1 week  CPAP Titration 1 week  Basic Metabolic Panel 1 week  Digoxin Level 1 week  Return Visit 15 MIN 3 weeks

## 2022-11-07 IMAGING — CT CT BIOPSY BONE MARROW
1 of 2 series · 10 of 14 positions shown, 13 images · non-contrast
Comparison: none

INDICATION: 84-year-old with monoclonal gammopathy of unknown significance,
anemia and thrombocytopenia.

[Series 2: i-spiral 5.0 b30f · axial · 0.58mm/px · z∈[+706,+836]mm · 10 of 43 slices shown, 13 images]
[im 4/43  soft-tissue]
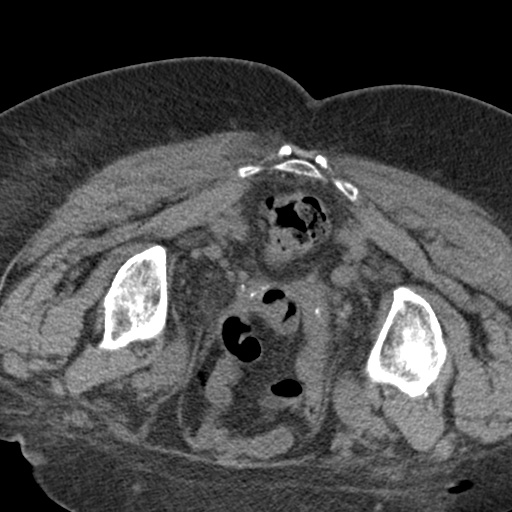
[im 4/43  bone]
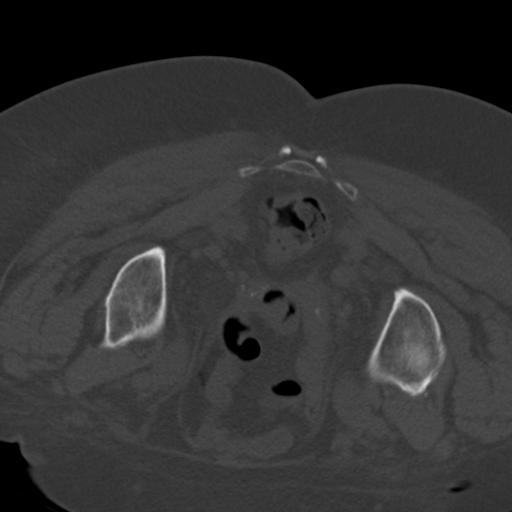
[im 8/43  bone]
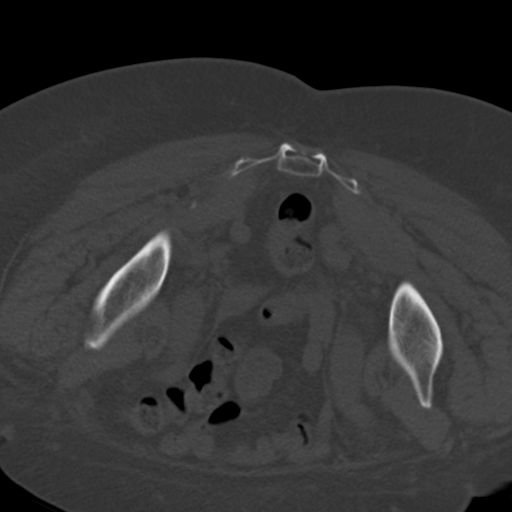
[im 12/43  bone]
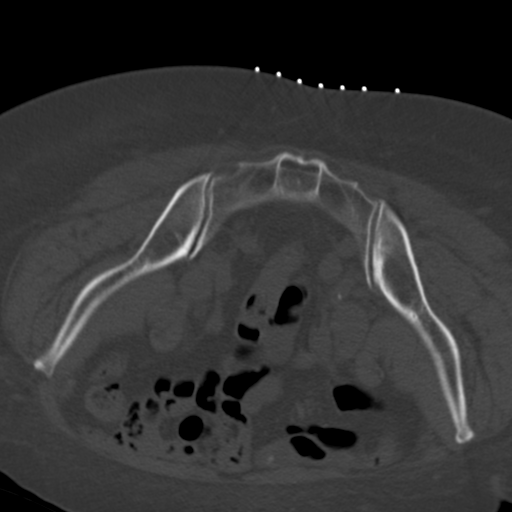
[im 16/43  bone]
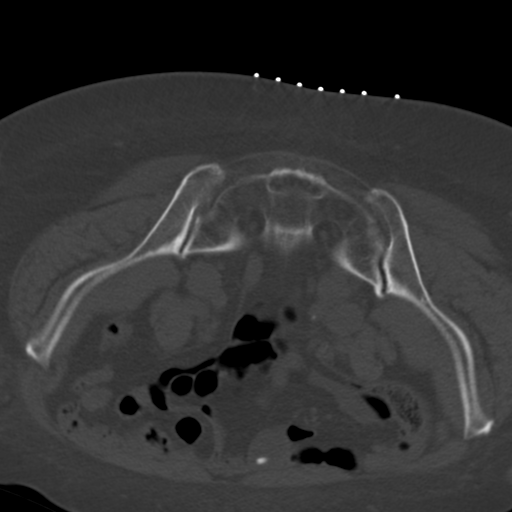
[im 20/43  soft-tissue]
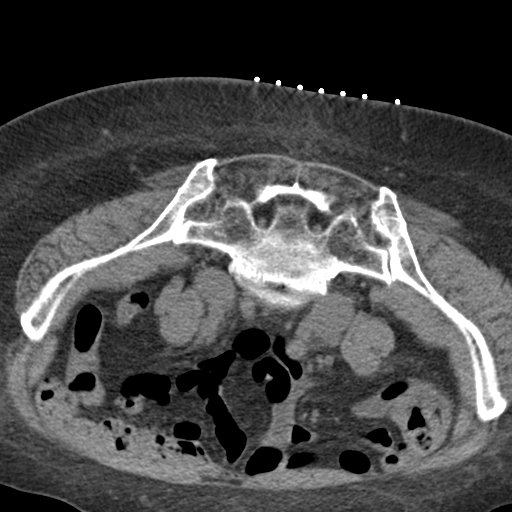
[im 20/43  bone]
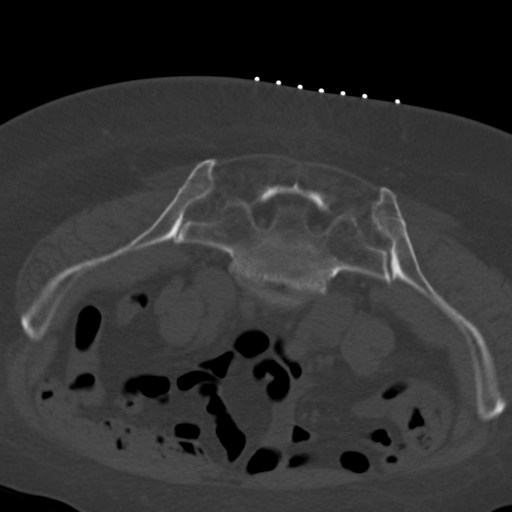
[im 23/43  bone]
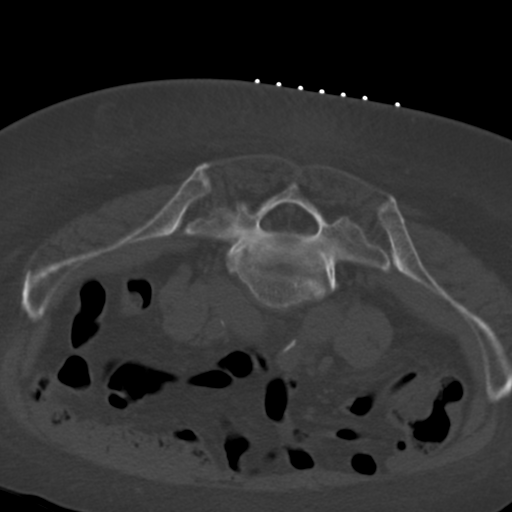
[im 27/43  bone]
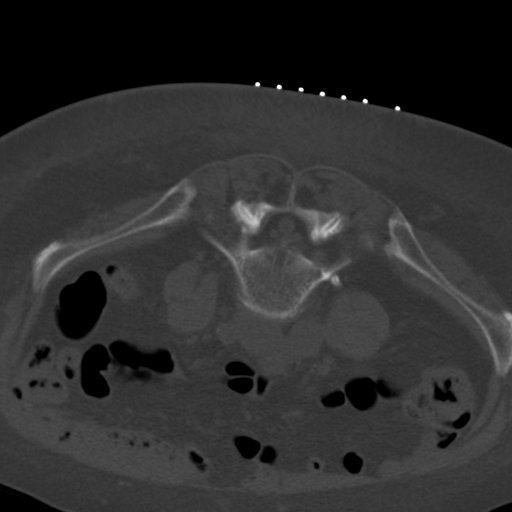
[im 31/43  bone]
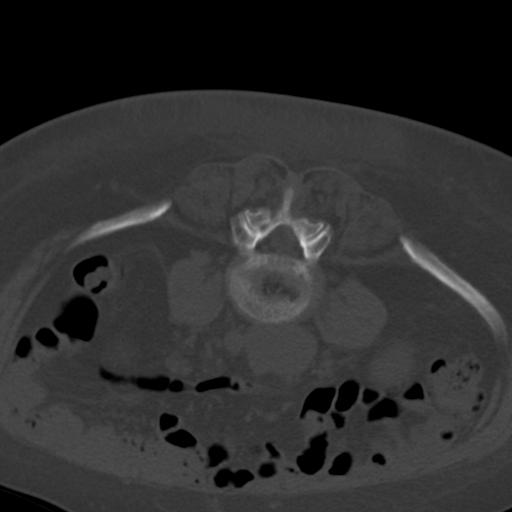
[im 35/43  soft-tissue]
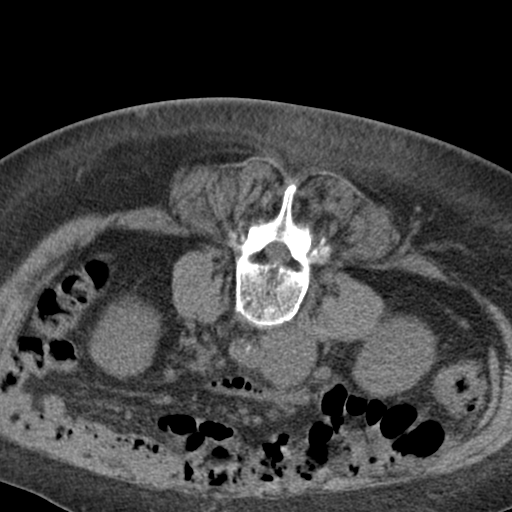
[im 35/43  bone]
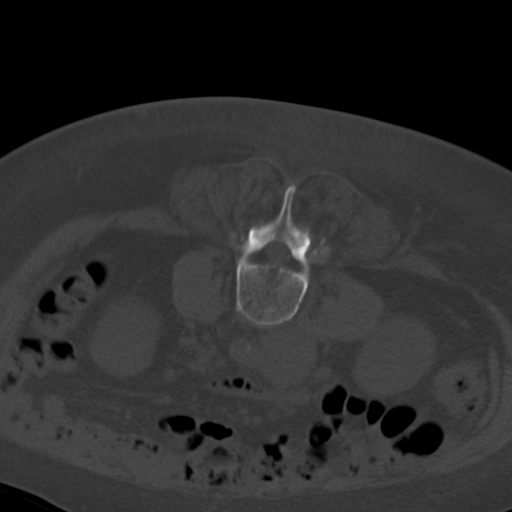
[im 39/43  bone]
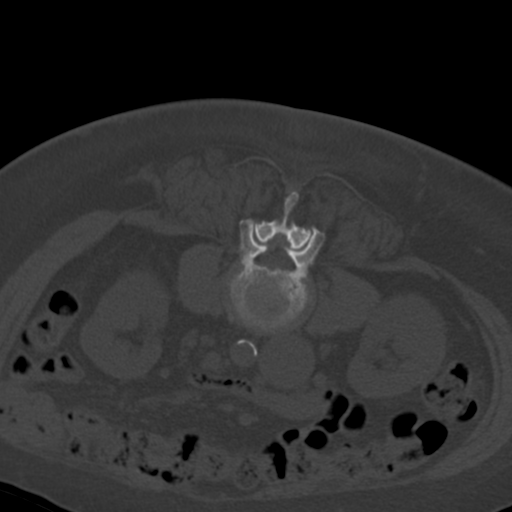

[10 of 14 positions shown; findings below may reference images not displayed]

EXAM:
CT GUIDED BONE MARROW ASPIRATES AND BIOPSY

MEDICATIONS:
None.

ANESTHESIA/SEDATION:
Fentanyl 100 mcg IV; Versed 1.0 mg IV

Moderate Sedation Time:  9 minutes

The patient was continuously monitored during the procedure by the
interventional radiology nurse under my direct supervision.

COMPLICATIONS:
None immediate.

PROCEDURE:
The procedure was explained to the patient. The risks and benefits
of the procedure were discussed and the patient's questions were
addressed. Informed consent was obtained from the patient. The
patient was placed prone on CT table. Images of the pelvis were
obtained. The right side of back was prepped and draped in sterile
fashion. The skin and right posterior ilium were anesthetized with
1% lidocaine. 11 gauge bone needle was directed into the right ilium
with CT guidance. Two aspirates and one core biopsy were obtained.
Bandage placed over the puncture site.
IMPRESSION: CT guided bone marrow aspiration and core biopsy.
